# Patient Record
Sex: Female | Born: 1970 | Race: Black or African American | Hispanic: No | Smoking: Current every day smoker
Health system: Southern US, Community
[De-identification: ages and names within clinical notes are randomized; demographics above are authoritative.]

## PROBLEM LIST (undated history)

## (undated) ENCOUNTER — Emergency Department (HOSPITAL_COMMUNITY): Admission: EM | Payer: MEDICAID

## (undated) ENCOUNTER — Emergency Department (HOSPITAL_COMMUNITY): Admission: EM | Payer: Medicaid Other | Source: Home / Self Care

## (undated) DIAGNOSIS — J45909 Unspecified asthma, uncomplicated: Secondary | ICD-10-CM

## (undated) DIAGNOSIS — E039 Hypothyroidism, unspecified: Secondary | ICD-10-CM

## (undated) DIAGNOSIS — F419 Anxiety disorder, unspecified: Secondary | ICD-10-CM

## (undated) DIAGNOSIS — M1990 Unspecified osteoarthritis, unspecified site: Secondary | ICD-10-CM

## (undated) DIAGNOSIS — Q2112 Patent foramen ovale: Secondary | ICD-10-CM

## (undated) DIAGNOSIS — Z720 Tobacco use: Secondary | ICD-10-CM

## (undated) DIAGNOSIS — I639 Cerebral infarction, unspecified: Secondary | ICD-10-CM

## (undated) DIAGNOSIS — F1910 Other psychoactive substance abuse, uncomplicated: Secondary | ICD-10-CM

## (undated) DIAGNOSIS — I499 Cardiac arrhythmia, unspecified: Secondary | ICD-10-CM

## (undated) DIAGNOSIS — R519 Headache, unspecified: Secondary | ICD-10-CM

## (undated) DIAGNOSIS — F329 Major depressive disorder, single episode, unspecified: Secondary | ICD-10-CM

## (undated) DIAGNOSIS — I10 Essential (primary) hypertension: Secondary | ICD-10-CM

## (undated) DIAGNOSIS — I480 Paroxysmal atrial fibrillation: Secondary | ICD-10-CM

## (undated) DIAGNOSIS — Q211 Atrial septal defect: Secondary | ICD-10-CM

## (undated) DIAGNOSIS — F32A Depression, unspecified: Secondary | ICD-10-CM

## (undated) DIAGNOSIS — R011 Cardiac murmur, unspecified: Secondary | ICD-10-CM

## (undated) HISTORY — DX: Depression, unspecified: F32.A

## (undated) HISTORY — DX: Morbid (severe) obesity due to excess calories: E66.01

## (undated) HISTORY — DX: Other psychoactive substance abuse, uncomplicated: F19.10

## (undated) HISTORY — PX: FRACTURE SURGERY: SHX138

## (undated) HISTORY — DX: Anxiety disorder, unspecified: F41.9

## (undated) HISTORY — DX: Tobacco use: Z72.0

## (undated) HISTORY — DX: Patent foramen ovale: Q21.12

## (undated) HISTORY — DX: Hypothyroidism, unspecified: E03.9

## (undated) HISTORY — DX: Paroxysmal atrial fibrillation: I48.0

## (undated) HISTORY — DX: Atrial septal defect: Q21.1

## (undated) HISTORY — DX: Major depressive disorder, single episode, unspecified: F32.9

## (undated) HISTORY — DX: Unspecified asthma, uncomplicated: J45.909

## (undated) HISTORY — DX: Cerebral infarction, unspecified: I63.9

## (undated) HISTORY — DX: Cardiac murmur, unspecified: R01.1

---

## 2002-05-16 ENCOUNTER — Emergency Department (HOSPITAL_COMMUNITY): Admission: EM | Admit: 2002-05-16 | Discharge: 2002-05-16 | Payer: Self-pay | Admitting: Emergency Medicine

## 2003-02-16 ENCOUNTER — Emergency Department (HOSPITAL_COMMUNITY): Admission: AD | Admit: 2003-02-16 | Discharge: 2003-02-16 | Payer: Self-pay | Admitting: Family Medicine

## 2003-10-19 ENCOUNTER — Emergency Department (HOSPITAL_COMMUNITY): Admission: EM | Admit: 2003-10-19 | Discharge: 2003-10-19 | Payer: Self-pay | Admitting: Family Medicine

## 2003-12-22 ENCOUNTER — Emergency Department (HOSPITAL_COMMUNITY): Admission: AD | Admit: 2003-12-22 | Discharge: 2003-12-22 | Payer: Self-pay | Admitting: Emergency Medicine

## 2004-03-30 ENCOUNTER — Emergency Department: Payer: Self-pay | Admitting: Unknown Physician Specialty

## 2004-03-31 ENCOUNTER — Ambulatory Visit: Payer: Self-pay | Admitting: Emergency Medicine

## 2006-02-15 ENCOUNTER — Emergency Department: Payer: Self-pay | Admitting: Emergency Medicine

## 2006-06-16 ENCOUNTER — Emergency Department: Payer: Self-pay | Admitting: Emergency Medicine

## 2006-07-12 ENCOUNTER — Encounter: Payer: Self-pay | Admitting: Maternal & Fetal Medicine

## 2006-09-23 ENCOUNTER — Encounter: Payer: Self-pay | Admitting: Maternal & Fetal Medicine

## 2006-09-27 ENCOUNTER — Observation Stay: Payer: Self-pay

## 2006-09-29 ENCOUNTER — Observation Stay: Payer: Self-pay | Admitting: Obstetrics and Gynecology

## 2006-10-14 ENCOUNTER — Encounter: Payer: Self-pay | Admitting: Obstetrics and Gynecology

## 2006-10-14 ENCOUNTER — Inpatient Hospital Stay: Payer: Self-pay | Admitting: Obstetrics and Gynecology

## 2007-08-05 ENCOUNTER — Emergency Department: Payer: Self-pay | Admitting: Emergency Medicine

## 2007-08-06 ENCOUNTER — Emergency Department (HOSPITAL_COMMUNITY): Admission: EM | Admit: 2007-08-06 | Discharge: 2007-08-07 | Payer: Self-pay | Admitting: Emergency Medicine

## 2009-03-01 ENCOUNTER — Emergency Department: Payer: Self-pay | Admitting: Emergency Medicine

## 2009-04-09 ENCOUNTER — Ambulatory Visit: Payer: Self-pay | Admitting: Orthopedic Surgery

## 2009-06-17 ENCOUNTER — Emergency Department: Payer: Self-pay | Admitting: Emergency Medicine

## 2010-04-01 ENCOUNTER — Ambulatory Visit: Payer: Self-pay | Admitting: Internal Medicine

## 2011-01-02 ENCOUNTER — Emergency Department: Payer: Self-pay | Admitting: Emergency Medicine

## 2011-09-05 LAB — HM PAP SMEAR: HM Pap smear: NEGATIVE

## 2012-07-20 ENCOUNTER — Ambulatory Visit: Payer: Self-pay | Admitting: Orthopedic Surgery

## 2012-07-27 ENCOUNTER — Ambulatory Visit: Payer: Self-pay | Admitting: Orthopedic Surgery

## 2012-09-30 ENCOUNTER — Encounter: Payer: Self-pay | Admitting: Orthopedic Surgery

## 2012-10-06 ENCOUNTER — Encounter: Payer: Self-pay | Admitting: Orthopedic Surgery

## 2012-11-04 ENCOUNTER — Encounter: Payer: Self-pay | Admitting: Orthopedic Surgery

## 2013-07-31 ENCOUNTER — Emergency Department: Payer: Self-pay | Admitting: Emergency Medicine

## 2013-09-02 LAB — COMPREHENSIVE METABOLIC PANEL
ALT: 23 U/L (ref 12–78)
ANION GAP: 6 — AB (ref 7–16)
Albumin: 3.6 g/dL (ref 3.4–5.0)
Alkaline Phosphatase: 81 U/L
BUN: 9 mg/dL (ref 7–18)
Bilirubin,Total: 0.3 mg/dL (ref 0.2–1.0)
CALCIUM: 9 mg/dL (ref 8.5–10.1)
CHLORIDE: 108 mmol/L — AB (ref 98–107)
Co2: 25 mmol/L (ref 21–32)
Creatinine: 1.02 mg/dL (ref 0.60–1.30)
EGFR (African American): >60
EGFR (Non-African Amer.): >60
Glucose: 114 mg/dL — ABNORMAL HIGH (ref 65–99)
OSMOLALITY: 277 (ref 275–301)
POTASSIUM: 3.1 mmol/L — AB (ref 3.5–5.1)
SGOT(AST): 44 U/L — ABNORMAL HIGH (ref 15–37)
SODIUM: 139 mmol/L (ref 136–145)
TOTAL PROTEIN: 7.9 g/dL (ref 6.4–8.2)

## 2013-09-02 LAB — URINALYSIS, COMPLETE
Bilirubin,UR: NEGATIVE
GLUCOSE, UR: NEGATIVE mg/dL (ref 0–75)
Ketone: NEGATIVE
Nitrite: NEGATIVE
PH: 5 (ref 4.5–8.0)
Protein: 30
Specific Gravity: 1.032 (ref 1.003–1.030)
Squamous Epithelial: 14

## 2013-09-02 LAB — CBC WITH DIFFERENTIAL/PLATELET
BASOS ABS: 0.1 10*3/uL (ref 0.0–0.1)
Basophil %: 0.5 %
EOS PCT: 0.8 %
Eosinophil #: 0.1 10*3/uL (ref 0.0–0.7)
HCT: 39.7 % (ref 35.0–47.0)
HGB: 12.8 g/dL (ref 12.0–16.0)
LYMPHS ABS: 4 10*3/uL — AB (ref 1.0–3.6)
LYMPHS PCT: 27.5 %
MCH: 27.3 pg (ref 26.0–34.0)
MCHC: 32.1 g/dL (ref 32.0–36.0)
MCV: 85 fL (ref 80–100)
MONO ABS: 0.8 x10 3/mm (ref 0.2–0.9)
MONOS PCT: 5.2 %
NEUTROS ABS: 9.7 10*3/uL — AB (ref 1.4–6.5)
Neutrophil %: 66 %
Platelet: 355 10*3/uL (ref 150–440)
RBC: 4.68 10*6/uL (ref 3.80–5.20)
RDW: 14.4 % (ref 11.5–14.5)
WBC: 14.7 10*3/uL — ABNORMAL HIGH (ref 3.6–11.0)

## 2013-09-02 LAB — TROPONIN I: Troponin-I: 0.84 ng/mL — ABNORMAL HIGH

## 2013-09-02 LAB — LIPASE, BLOOD: LIPASE: 153 U/L (ref 73–393)

## 2013-09-03 ENCOUNTER — Inpatient Hospital Stay: Payer: Self-pay | Admitting: Internal Medicine

## 2013-09-03 LAB — CK TOTAL AND CKMB (NOT AT ARMC)
CK, TOTAL: 223 U/L — AB
CK, Total: 225 U/L — ABNORMAL HIGH
CK-MB: 1.8 ng/mL (ref 0.5–3.6)
CK-MB: 1.8 ng/mL (ref 0.5–3.6)

## 2013-09-03 LAB — TROPONIN I
Troponin-I: 0.71 ng/mL — ABNORMAL HIGH
Troponin-I: 0.64 ng/mL — ABNORMAL HIGH

## 2013-09-03 LAB — APTT
ACTIVATED PTT: 30.9 s (ref 23.6–35.9)
ACTIVATED PTT: 67.2 s — AB (ref 23.6–35.9)
ACTIVATED PTT: 69.7 s — AB (ref 23.6–35.9)

## 2013-09-03 LAB — PROTIME-INR
INR: 1
PROTHROMBIN TIME: 13.5 s (ref 11.5–14.7)

## 2013-09-03 LAB — T4, FREE: FREE THYROXINE: 1 ng/dL (ref 0.76–1.46)

## 2013-09-03 LAB — MAGNESIUM: Magnesium: 1.8 mg/dL

## 2013-09-04 LAB — HEPATIC FUNCTION PANEL A (ARMC)
ALK PHOS: 122 U/L — AB
ALT: 217 U/L — AB (ref 12–78)
Albumin: 3 g/dL — ABNORMAL LOW (ref 3.4–5.0)
BILIRUBIN TOTAL: 0.2 mg/dL (ref 0.2–1.0)
Bilirubin, Direct: <0.1 mg/dL (ref 0.00–0.20)
SGOT(AST): 205 U/L — ABNORMAL HIGH (ref 15–37)
Total Protein: 6.6 g/dL (ref 6.4–8.2)

## 2013-09-04 LAB — CBC WITH DIFFERENTIAL/PLATELET
BASOS PCT: 0.7 %
Basophil #: 0.1 10*3/uL (ref 0.0–0.1)
EOS ABS: 0.2 10*3/uL (ref 0.0–0.7)
EOS PCT: 2.6 %
HCT: 37.3 % (ref 35.0–47.0)
HGB: 12.1 g/dL (ref 12.0–16.0)
LYMPHS PCT: 46 %
Lymphocyte #: 4.3 10*3/uL — ABNORMAL HIGH (ref 1.0–3.6)
MCH: 27.6 pg (ref 26.0–34.0)
MCHC: 32.5 g/dL (ref 32.0–36.0)
MCV: 85 fL (ref 80–100)
MONO ABS: 0.7 x10 3/mm (ref 0.2–0.9)
MONOS PCT: 7.8 %
NEUTROS PCT: 42.9 %
Neutrophil #: 4 10*3/uL (ref 1.4–6.5)
Platelet: 305 10*3/uL (ref 150–440)
RBC: 4.39 10*6/uL (ref 3.80–5.20)
RDW: 14.6 % — AB (ref 11.5–14.5)
WBC: 9.3 10*3/uL (ref 3.6–11.0)

## 2013-09-04 LAB — LIPID PANEL
Cholesterol: 140 mg/dL (ref 0–200)
HDL: 30 mg/dL — AB (ref 40–60)
LDL CHOLESTEROL, CALC: 83 mg/dL (ref 0–100)
TRIGLYCERIDES: 137 mg/dL (ref 0–200)
VLDL CHOLESTEROL, CALC: 27 mg/dL (ref 5–40)

## 2013-09-04 LAB — BASIC METABOLIC PANEL
Anion Gap: 5 — ABNORMAL LOW (ref 7–16)
BUN: 11 mg/dL (ref 7–18)
CHLORIDE: 108 mmol/L — AB (ref 98–107)
CO2: 24 mmol/L (ref 21–32)
Calcium, Total: 8.8 mg/dL (ref 8.5–10.1)
Creatinine: 0.81 mg/dL (ref 0.60–1.30)
EGFR (Non-African Amer.): >60
Glucose: 103 mg/dL — ABNORMAL HIGH (ref 65–99)
OSMOLALITY: 273 (ref 275–301)
POTASSIUM: 3.8 mmol/L (ref 3.5–5.1)
Sodium: 137 mmol/L (ref 136–145)

## 2013-09-04 LAB — PROTIME-INR
INR: 1.2
Prothrombin Time: 14.6 secs (ref 11.5–14.7)

## 2013-09-04 LAB — APTT: Activated PTT: 97.6 secs — ABNORMAL HIGH (ref 23.6–35.9)

## 2013-09-06 ENCOUNTER — Other Ambulatory Visit: Payer: Self-pay | Admitting: Urgent Care

## 2013-09-06 LAB — HEPATIC FUNCTION PANEL A (ARMC)
ALBUMIN: 3.6 g/dL (ref 3.4–5.0)
ALT: 119 U/L — AB (ref 12–78)
AST: 51 U/L — AB (ref 15–37)
Alkaline Phosphatase: 109 U/L
BILIRUBIN TOTAL: 0.4 mg/dL (ref 0.2–1.0)
Bilirubin, Direct: <0.1 mg/dL (ref 0.00–0.20)
TOTAL PROTEIN: 7.9 g/dL (ref 6.4–8.2)

## 2013-10-03 ENCOUNTER — Ambulatory Visit: Payer: Self-pay | Admitting: Gastroenterology

## 2014-04-20 DIAGNOSIS — Z9151 Personal history of suicidal behavior: Secondary | ICD-10-CM | POA: Insufficient documentation

## 2014-06-06 LAB — HM PAP SMEAR: HM Pap smear: NEGATIVE

## 2014-07-28 NOTE — Discharge Summary (Signed)
PATIENT NAME:  Margaret [name], Margaret [name] MR#:  [id number] DATE OF BIRTH:  1970-11-11  DATE OF ADMISSION:  09/03/2013 DATE OF DISCHARGE:  09/04/2013  The patient signed AGAINST MEDICAL ADVICE on 09/04/2013.  DIAGNOSES:  Which we were treating at that time: 1.  Chest pain, elevated troponin, cardiac cath was done and negative.  2.  Hypothyroidism.  3.  Smoking.  4.  Hypokalemia.  5. Lower abdominal pain and cholelithiasis. Plan was for endoscopic retrograde cholangiopancreatography, but patient signed AGAINST MEDICAL ADVICE.   HISTORY OF PRESENT ILLNESS: A 44 year old female with significant past medical history of hypothyroidism presented with complaint of epigastric chest pain radiating to right arm that started a few hours prior to presentation. The pain was waxing and waning, but was consistent for the last few hours, and so decided to come to the Emergency Room. Troponin was noted to be 0.84 and she was admitted with IV heparin drip and possible diagnosis of non-ST-elevation MI or coronary artery disease to medical floor.   HOSPITAL COURSE AND STAY: She was seen by cardiologist and next day cardiac catheterization was done which was negative  troponin.    OTHER MEDICAL ISSUES:   1.  For the lower abdominal pain, sonogram was done, which showed that she has cholelithiasis but no active infection.  Surgery consult was done and GI consult was done.  Finally, GI was planning to do ERCP the next day, but in the night, she said she could not wait and she had to go, has some emergencies at home and so decided to sign out against medical.  2.  Hypothyroidism. We continued levothyroxine.  3.  Smoking. She was counseled for that and offered nicotine patch.  4.  Urinary tract infection. We treated with Rocephin.   IMPORTANT LABORATORY RESULTS IN THE HOSPITAL: Troponin was 0.84 on admission. WBC 14.7. Hemoglobin was 12.8. Platelet count was 855. Creatinine 1.02. Thyroxine free was 1.  Ultrasound of  abdomen, limited survey, showed choledocholithiasis with common bile duct enlargement and obstruction, cholelithiasis without any cholecystitis. Cardiac catheterization was done and it showed normal coronary arteries, normal left ventricular function.   Total time spent on this discharge summary is 35 minutes.   ____________________________ Hope PigeonVaibhavkumar G. Elisabeth PigeonVachhani, MD vgv:dmm D: 09/07/2013 14:34:00 ET T: 09/07/2013 19:39:56 ET JOB#: [id number]  cc: Hope PigeonVaibhavkumar G. Elisabeth PigeonVachhani, MD, <Dictator> Altamese DillingVAIBHAVKUMAR Joaovictor Krone MD ELECTRONICALLY SIGNED 09/20/2013 9:02

## 2014-07-28 NOTE — Consult Note (Signed)
PATIENT NAME:  Margaret [name], Margaret [name] MR#:  [id number] DATE OF BIRTH:  04/11/1970  DATE OF CONSULTATION:  09/04/2013  REFERRING PHYSICIAN:  Cristal Deer A. Lundquist, MD CONSULTING PHYSICIAN:  Midge Minium, MD; Joselyn Arrow, NP CARDIOLOGIST:  Marcina Millard, MD  REASON FOR CONSULTATION: Choledocholithiasis.   HISTORY OF PRESENT ILLNESS: Margaret [name] is a 44 year old female with history of hypothyroidism. She reports 2 days ago around 9:00 p.m., she developed severe right upper quadrant epigastric pain which radiated to her chest, her right shoulder, and her back. The pain waxed and waned, but she had 10 episodes of nausea and vomiting with the pain. She continues to have pain at a rate of 5 out of 10, down from 10 out of 10. She underwent a cardiac catheterization today which was negative. She had a bump in her alkaline phosphatase to 122, AST to 205, and ALT to 217. She had 3 elevated troponins. Her white blood cell count was elevated at 14.7, now normal today. Her INR is 1.2. She did have a UTI as well and has been started on Rocephin daily for this. She has not had a bowel movement in the last couple of days, but really has not been eating much. She denies any GI history, including a history of GERD or peptic ulcer disease. She denies any rectal bleeding, melena, or diarrhea. She denies any fever or chills.   PAST MEDICAL AND SURGICAL HISTORY: Hypothyroidism.   MEDICATIONS PRIOR TO ADMISSION: Levothyroxine 100 mcg daily.   ALLERGIES: No known drug allergies.   FAMILY HISTORY: There is no known family history of colorectal carcinoma, liver or chronic GI problems.   SOCIAL HISTORY: She is single. She has 2 children; one is grown and the other child lives with her. She smokes less than 1/2 pack of cigarettes per day. Denies any alcohol or illicit drug use. 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   REVIEW OF SYSTEMS: See HPI. She had mild dysuria. Otherwise negative 12-point review  of systems.   PHYSICAL EXAMINATION: VITAL SIGNS: Temperature 98.7, pulse 69, respirations 18, blood pressure 106/73, O2 sat 98% on room air.  GENERAL: She is an obese black female who is alert, oriented, pleasant and cooperative, in no acute distress.  HEENT: Sclerae clear, anicteric. Conjunctivae pink. Oropharynx pink and moist without lesions.  NECK: Supple without mass or thyromegaly.  HEART: Regular rate and rhythm. Normal S1, S2 without murmurs, clicks, rubs, or gallops. LUNGS: Clear to auscultation bilaterally.  ABDOMEN: Positive bowel sounds x 4. No bruits auscultated. Abdomen is soft, nondistended. She does have mild tenderness to the epigastrium and right upper quadrant on deep palpation. There is no rebound tenderness or guarding. No hepatosplenomegaly or mass. Positive Murphy's  point tenderness.  EXTREMITIES: Without clubbing or edema.  SKIN: Warm and dry without any rash or jaundice.  MUSCULOSKELETAL: Good equal movement and strength bilaterally.  NEUROLOGIC: Grossly intact.  PSYCHIATRIC: Alert, pleasant, cooperative, normal mood and affect.   IMPRESSION: [name] is a very pleasant 44 year old black female admitted with biliary-type abdominal pain, nausea, vomiting, and bump in transaminases with ultrasound showing choledocholithiasis. She had a negative cardiac cath today. ERCP for stone extraction and sphincterotomy tomorrow with Dr. Servando Snare. I have discussed risks and benefits of the procedure, which include but are not limited to pancreatitis, bleeding, infection, perforation, or drug reaction. She agrees with this plan, and consent will be obtained. She is on Rocephin daily.   PLAN: 1.  NPO after  midnight.  2.  ERCP tomorrow.  3.  Continue supportive measures.   Thank you for allowing us to participate in her care.   ____________________________ Joselyn ArrowKandice L. Kerrington Greenhalgh, NP klj:jcm D: 09/04/2013 17:02:27 ET T: 09/04/2013 17:49:30 ET JOB#: [id number]  cc: Joselyn ArrowKandice L. Stori Royse,  NP, <Dictator> Marcina MillardAlexander Paraschos, MD Joselyn ArrowKANDICE L Darien Mignogna FNP ELECTRONICALLY SIGNED 09/25/2013 20:06

## 2014-07-28 NOTE — H&P (Signed)
PATIENT NAME:  Margaret BreezeHAITH HALILOU, Malynn L MR#:  [id number] DATE OF BIRTH:  1970-08-22  DATE OF ADMISSION:  09/03/2013  REFERRING PHYSICIAN: Dr. Loleta Roseory Forbach.  PRIMARY CARE PHYSICIAN: [facility].   CHIEF COMPLAINT: Chest pain, epigastric pain.   HISTORY OF PRESENT ILLNESS: This is a 44 year old female with significant past medical history of hypothyroidism, presents with complaints of epigastric/chest pain radiating to the right arm, started few hours prior to presentation. The patient reports her pain waxed and waned, but currently has been persistent over the last few hours, described it as sharp, stabbing quality, as well was accompanied by a few episodes of nausea and vomiting. Currently, she denies any chest pain, reports it was worsened by movement with no relieving factor. This is the patient's first visit to us so we no baseline EKG to compare. Did show some mild flattening over the T wave on the lateral leads. The patient's workup was significant for elevated troponin at 0.84. The patient denies any previous cardiac history or as well, no family history of cardiac disease at a young age. She reports smoking. One pack lasts her around 3 days. She has mild leukocytosis at 14,000, as well she has positive urinalysis. The patient was given aspirin and started on heparin drip in ED out of concern of acute coronary syndrome. Hospitalist service was requested to admit the patient for further evaluation.   PAST MEDICAL HISTORY: Hypothyroidism.   FAMILY HISTORY: The patient denies any family history of coronary artery disease, reports a family history of hypertension.   SOCIAL HISTORY: The patient smokes. One pack lasts her for 3 days. No alcohol. No illicit drug use. Works at [employer].   ALLERGIES: No known drug allergies.   HOME MEDICATIONS:  1. Levothyroxine 100 mcg oral daily.  2. Multivitamin 1 tablet daily.   REVIEW OF SYSTEMS:   GENERAL: The patient denies any fever, chills, fatigue, weakness.   EYES: Denies blurry vision, double vision, inflammation, glaucoma.  ENT: Denies tinnitus, ear pain, hearing loss, epistaxis.  RESPIRATORY: Denies cough, wheezing, hemoptysis, shortness of breath.  CARDIOVASCULAR: Reports chest/epigastric pain. Denies any edema, palpitations, syncope.  GASTROINTESTINAL: Reports nausea, vomiting, abdominal pain. Denies any hematemesis, coffee-ground emesis, melena, bright red blood per rectum.  GENITOURINARY: Denies dysuria, hematuria, or renal colic.  ENDOCRINE: Denies polyuria, polydipsia, heat or cold intolerance.  HEMATOLOGY: Denies anemia, easy bruising, bleeding diathesis. INTEGUMENT: Denies acne, rash, or skin lesion.  MUSCULOSKELETAL: Denies any swelling, gout, cramps. NEUROLOGIC: Denies CVA, TIA, numbness, dysarthria.  PSYCHIATRIC: Denies anxiety, insomnia, or depression.   PHYSICAL EXAMINATION:  VITAL SIGNS: Temperature 97.8, pulse 87, respiratory rate 20, blood pressure 121/82, saturating 97% on room air.  GENERAL: Well-nourished female who looks comfortable in bed, in no apparent distress.  HEENT: Head atraumatic, normocephalic. Pupils equal, reactive to light. Pink conjunctivae. Anicteric sclerae. Moist oral mucosa.  NECK: Supple. No thyromegaly. No JVD.  CHEST: Good air entry bilaterally. No wheezing, rales, rhonchi.  CARDIOVASCULAR: S1, S2. No rubs, murmurs, and gallops.  ABDOMEN: Has mild epigastric tenderness to palpation. Reports it resembles  pain she came with. As well, had some right upper quadrant tenderness but negative Murphy's sign. Bowel sounds present. No organomegaly appreciated.  EXTREMITIES: No edema. No clubbing. No cyanosis. Pedal and radial pulses felt bilaterally.  PSYCHIATRIC: Appropriate affect. Awake, alert x 3. Intact judgment and insight.  NEUROLOGIC: Cranial nerves grossly intact. Motor 5/5. No focal deficits.  SKIN: Normal skin turgor. Warm and dry. No rash.  MUSCULOSKELETAL: No joint effusion or erythema.  PERTINENT LABORATORIES: Glucose 114, BUN 9, creatinine 1.02, sodium 139, potassium 3.1, chloride 108, CO2 of 25, ALT 23, AST 44, alkaline phosphatase 81. Troponin 0.84. White blood cells 14.7, hemoglobin 12.8 , platelets 355,000. INR is 1. Urinalysis showing +1 leukocyte esterase with 7 white blood cells.  Chest x-ray showing no acute cardiopulmonary abnormality.   ASSESSMENT AND PLAN:  1. Chest pain with elevated troponins. The patient's risk factor includes her smoking history. She was given aspirin and started on heparin drip in Emergency Department out of concern of acute coronary syndrome. We will start her on beta blockers as well. We will keep her on sublingual nitroglycerin as needed. Will keep on a daily baby aspirin. We will continue to cycle her cardiac enzymes, follow the trend. She will be admitted to telemetry. We will consult cardiology for further recommendation. Will check lipid panel as well.  2. Hypothyroidism. Continue with levothyroxine.  3. Tobacco abuse: The patient was counseled. Will be started on Nicoderm patch.  4. Hypokalemia. We will replace. 5. Urinary tract infection. Continue with Rocephin. 6. Deep vein thrombosis prophylaxis. The patient is currently on full dose anticoagulation with heparin drip.   TOTAL TIME SPENT ON ADMISSION AND PATIENT CARE: 50 minutes.    ____________________________ Starleen Arms, MD dse:lt D: 09/03/2013 02:24:11 ET T: 09/03/2013 06:38:35 ET JOB#: [id number]  cc: Starleen Arms, MD, <Dictator> Kennisha Qin Teena Irani MD ELECTRONICALLY SIGNED 09/03/2013 23:53

## 2014-07-28 NOTE — Consult Note (Signed)
Brief Consult Note: Comments: Attempted to see pt for GI consult but pt down for cardiac cath. Will check back this afternoon after clinic. Please call if you have any questions or concerns.  Electronic Signatures: Joselyn ArrowJones, Zion Lint L (NP)  (Signed 01-Jun-15 13:19)  Authored: Brief Consult Note   Last Updated: 01-Jun-15 13:19 by Joselyn ArrowJones, Krystl Wickware L (NP)

## 2014-07-28 NOTE — Consult Note (Signed)
Brief Consult Note: Diagnosis: Choledocholithiasis.   Patient was seen by consultant.   Comments: [name] is a very pleasant 44 y/o black female admitted with biliary type abdominal pain, nausea, vomiting & bump in transaminases with ultrasound showing choledocholithiasis.  She had a negative cardiac cath today.  ERCP for stone extraction & sphincterotomy tomorrow with Dr Servando SnareWohl.  Discussed risks/benefits of procedure which include but are not limited to pancreatitis, bleeding, infection, perforation & drug reaction.  Patient agrees with this plan & consent will be obtained.  She is on Rocephin daily.  Plan: 1) NPO after MN 2) ERCP tomorrow 3) Continue supportive measures Thanks for allowing us to participate in her care.  Please see full dictated note. #[id number].  Electronic Signatures: Margaret [name], Margaret [name] (NP)  (Signed 01-Jun-15 16:58)  Authored: Brief Consult Note   Last Updated: 01-Jun-15 16:58 by Margaret [name], Margaret [name] (NP)

## 2014-07-28 NOTE — Consult Note (Signed)
PATIENT NAME:  Margaret [name], Margaret [name] DATE OF BIRTH:  [birth year]  DATE OF CONSULTATION:  09/04/2013  REFERRING PHYSICIAN:   CONSULTING PHYSICIAN:  Cristal Deerhristopher A. Nesbit Michon, MD  REASON FOR CONSULTATION: Right upper quadrant pain, chest pain, gallstones on ultrasound and common bile duct stones on ultrasound.   HISTORY OF PRESENT ILLNESS: Margaret [name] is a pleasant 44 year old female who presented with history of hypothyroidism, who presented on Saturday with acute onset of severe right upper quadrant pain radiating up to her chest and sternum, as well as numbness in her right arm and shoulder. She was brought to the ER and noted to have a troponin of 0.84. She was also noted to have a white cell count 14,000 with a urinary tract infection. She is still having some right upper quadrant pain. Also reports some suprapubic pain, which is new. Otherwise, no fevers, chills, night sweats, shortness of breath, cough, left-sided chest pain, nausea, vomiting, diarrhea, constipation, dysuria, hematuria. Does report that she has had episodes of right upper quadrant pain in the past.   PAST MEDICAL HISTORY: Hypothyroidism.   HOME MEDICATIONS: As follows: L-thyroxine 100 mcg p.o. daily.   SOCIAL HISTORY: Smokes approximately 6 to 7 cigarettes a day. Denies alcohol use. Is single with 3 children.   FAMILY HISTORY: History of diabetes, high blood pressure. No obvious history of coronary artery disease or MI. Does have a history of cervical cancer, I believe, in her grandmother.   REVIEW OF SYSTEMS: The 12-point review of systems was obtained. Pertinent positives and negatives as above.   PHYSICAL EXAMINATION:  VITAL SIGNS: Temperature 98.1, pulse 69, blood pressure 112/76, respirations 18, 99% on room air.  GENERAL: No acute distress, alert and oriented x 3.  HEAD: Normocephalic, atraumatic.  EYES: No scleral icterus. No conjunctivitis.   FACE: No visible face trauma. Normal external  nose. Normal external ears.  CHEST: Lungs clear to auscultation. Moving air well.  HEART: Regular rate and rhythm. No murmurs, rubs or gallops.  ABDOMEN: Soft, mildly tender right upper quadrant, minimal tender suprapubic and right lower quadrant.  EXTREMITIES: Moves all extremities well. Strength 5/5.  NEUROLOGIC: Cranial nerves II through XII grossly intact.   DIAGNOSTIC DATA: Labs significant for current white cell count of 9.3, hemoglobin 12.1, hematocrit 37.3, platelets 305. Troponin 0.64 yesterday morning. Labs otherwise normal. LFTs are normal.  U/S shows a 9 mm common bile duct, 3 calculi in the mid to distal common bile duct, non-mobile stones in the gallbladder.   ASSESSMENT AND PLAN: Margaret [name] is a pleasant 44 year old who presents with right upper quadrant pain, elevated troponin, as well as a urinary tract infection, not concerned for appendicitis based on the patient's clinical exam and laboratory values as does not have a significant leukocytosis with neutrophil predominance. Pain may be related to cholelithiasis. I have recommended GI consult and I have spoken with GI about possible ERCP. Will follow up on cardiac cath findings. Will determine possible surgical options when cath findings and GI consults are determined.    ____________________________ Si Raiderhristopher A. Sie Formisano, MD cal:es D: 09/04/2013 12:16:40 ET T: 09/04/2013 13:01:33 ET JOB#: [id number]  cc: Cristal Deerhristopher A. Fernando Torry, MD, <Dictator> Jarvis NewcomerHRISTOPHER A Brande Uncapher MD ELECTRONICALLY SIGNED 09/05/2013 17:18

## 2014-07-28 NOTE — Consult Note (Signed)
PATIENT NAME:  Margaret BreezeHAITH HALILOU, Harleyquinn L MR#:  [id number] DATE OF BIRTH:  [birth year]  DATE OF CONSULTATION:  09/03/2013  REFERRING PHYSICIAN:  Dr. Randol KernElgergawy CONSULTING PHYSICIAN:  Marcina MillardAlexander Alonni Heimsoth, MD  PRIMARY CARE PHYSICIAN: [facility].   CHIEF COMPLAINT: Chest pain.   HISTORY OF PRESENT ILLNESS: The patient is a 44 year old female with history of hypothyroidism. The patient was in usual state of health until day of admission, when she experienced right upper quadrant discomfort described as a stabbing sensation which radiated up to her chest, and with associated numbness in her right arm and shoulder. Symptoms waxed and waned for several hours. The [facility] where EKG was nondiagnostic. Troponin was elevated at 0.84. The patient also had an elevated white count of 14,000 with positive urinalysis for urinary tract infection.   PAST MEDICAL HISTORY: Hypothyroidism.   MEDICATIONS: Levothyroxine 100 mcg daily.   SOCIAL HISTORY: The patient is single. She has three children. She smokes less than 1/2 pack of cigarettes a day.   FAMILY HISTORY: No immediate family history of coronary artery disease or myocardial infarction.   REVIEW OF SYSTEMS:  CONSTITUTIONAL: The patient has had some mild chills.  EYES: No blurry vision.  EARS: No hearing loss.  RESPIRATORY: No shortness of breath.  CARDIOVASCULAR: Chest pain as described above.  GASTROINTESTINAL: The patient had right upper quadrant discomfort.  GENITOURINARY: The patient has had some mild dysuria.  MUSCULOSKELETAL: No arthralgias or myalgias.   NEUROLOGICAL: No focal muscle weakness or numbness.  PSYCHOLOGICAL: No depression or anxiety.   PHYSICAL EXAMINATION: VITAL SIGNS: Blood pressure 108/77, pulse 81 respiration 20, temperature 97.9, pulse oximetry 100%.  HEENT: Pupils equal, reactive to light and accommodation.  NECK: Supple without thyromegaly.  LUNGS: Clear.  HEART: Normal JVP. Normal PMI.  Regular rate and rhythm. Normal S1, S2. No appreciable gallop, murmur, or rub.  ABDOMEN: Soft and nontender. Pulses were intact bilaterally.  MUSCULOSKELETAL: Normal muscle tone.  NEUROLOGIC: The patient is alert and oriented x 3. Motor and sensory both grossly intact.   IMPRESSION: A 44 year old female with new onset chest pain with typical and atypical features with elevated troponin worrisome for unstable angina versus non-ST elevation myocardial infarction.   RECOMMENDATIONS: 1. Agree with overall current therapy.  2. Continue heparin drip.  3. Proceed with cardiac catheterization with selective coronary arteriography in the a.m.   The risks, benefits, and alternatives were explained and informed written consent obtained.   ____________________________ Marcina MillardAlexander Brindy Higginbotham, MD ap:sg D: 09/03/2013 09:28:53 ET T: 09/03/2013 10:50:26 ET JOB#: [id number]  cc: Marcina MillardAlexander Kruze Atchley, MD, <Dictator> Marcina MillardALEXANDER Niranjan Rufener MD ELECTRONICALLY SIGNED 09/19/2013 8:28

## 2014-08-26 ENCOUNTER — Emergency Department: Payer: Medicaid Other

## 2014-08-26 ENCOUNTER — Emergency Department
Admission: EM | Admit: 2014-08-26 | Discharge: 2014-08-26 | Disposition: A | Discharge status: Home / Self Care | Payer: Medicaid Other | Attending: Emergency Medicine | Admitting: Emergency Medicine

## 2014-08-26 ENCOUNTER — Encounter: Payer: Self-pay | Admitting: Emergency Medicine

## 2014-08-26 DIAGNOSIS — S199XXA Unspecified injury of neck, initial encounter: Secondary | ICD-10-CM | POA: Diagnosis present

## 2014-08-26 DIAGNOSIS — Z720 Tobacco use: Secondary | ICD-10-CM | POA: Diagnosis not present

## 2014-08-26 DIAGNOSIS — S161XXA Strain of muscle, fascia and tendon at neck level, initial encounter: Secondary | ICD-10-CM | POA: Diagnosis not present

## 2014-08-26 DIAGNOSIS — Z3202 Encounter for pregnancy test, result negative: Secondary | ICD-10-CM | POA: Diagnosis not present

## 2014-08-26 DIAGNOSIS — Y92410 Unspecified street and highway as the place of occurrence of the external cause: Secondary | ICD-10-CM | POA: Insufficient documentation

## 2014-08-26 DIAGNOSIS — S3992XA Unspecified injury of lower back, initial encounter: Secondary | ICD-10-CM | POA: Diagnosis not present

## 2014-08-26 DIAGNOSIS — Y9389 Activity, other specified: Secondary | ICD-10-CM | POA: Diagnosis not present

## 2014-08-26 DIAGNOSIS — Y998 Other external cause status: Secondary | ICD-10-CM | POA: Insufficient documentation

## 2014-08-26 DIAGNOSIS — T148XXA Other injury of unspecified body region, initial encounter: Secondary | ICD-10-CM

## 2014-08-26 LAB — URINALYSIS COMPLETE WITH MICROSCOPIC (ARMC ONLY)
BILIRUBIN URINE: NEGATIVE
Bacteria, UA: NONE SEEN
GLUCOSE, UA: NEGATIVE mg/dL
HGB URINE DIPSTICK: NEGATIVE
KETONES UR: NEGATIVE mg/dL
LEUKOCYTES UA: NEGATIVE
Nitrite: NEGATIVE
PH: 6 (ref 5.0–8.0)
PROTEIN: NEGATIVE mg/dL
RBC / HPF: NONE SEEN RBC/hpf (ref 0–5)
Specific Gravity, Urine: 1.01 (ref 1.005–1.030)

## 2014-08-26 LAB — POCT PREGNANCY, URINE: Preg Test, Ur: NEGATIVE

## 2014-08-26 MED ORDER — ORPHENADRINE CITRATE 30 MG/ML IJ SOLN
60.0000 mg | Freq: Two times a day (BID) | INTRAMUSCULAR | Status: DC
  Administered 2014-08-26: 60 mg via INTRAMUSCULAR

## 2014-08-26 MED ORDER — KETOROLAC TROMETHAMINE 60 MG/2ML IM SOLN
INTRAMUSCULAR | Status: AC
  Filled 2014-08-26: qty 2

## 2014-08-26 MED ORDER — ORPHENADRINE CITRATE 30 MG/ML IJ SOLN
INTRAMUSCULAR | Status: AC
  Filled 2014-08-26: qty 2

## 2014-08-26 MED ORDER — CYCLOBENZAPRINE HCL 10 MG PO TABS: 10.0000 mg | ORAL_TABLET | Freq: Three times a day (TID) | ORAL | Status: DC | PRN

## 2014-08-26 MED ORDER — IBUPROFEN 800 MG PO TABS: 800.0000 mg | ORAL_TABLET | Freq: Three times a day (TID) | ORAL | Status: DC | PRN

## 2014-08-26 MED ORDER — KETOROLAC TROMETHAMINE 60 MG/2ML IM SOLN
60.0000 mg | Freq: Once | INTRAMUSCULAR | Status: AC
  Administered 2014-08-26: 60 mg via INTRAMUSCULAR
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
## 2014-10-18 ENCOUNTER — Ambulatory Visit (INDEPENDENT_AMBULATORY_CARE_PROVIDER_SITE_OTHER): Payer: Medicaid Other | Admitting: Family Medicine

## 2014-10-18 ENCOUNTER — Encounter: Payer: Self-pay | Admitting: Family Medicine

## 2014-10-18 VITALS — BP 117/82 | Temp 98.5°F | Ht 66.0 in | Wt 210.9 lb

## 2014-10-18 DIAGNOSIS — E039 Hypothyroidism, unspecified: Secondary | ICD-10-CM | POA: Diagnosis not present

## 2014-10-18 DIAGNOSIS — N926 Irregular menstruation, unspecified: Secondary | ICD-10-CM | POA: Diagnosis not present

## 2014-10-18 DIAGNOSIS — Z720 Tobacco use: Secondary | ICD-10-CM

## 2014-10-18 DIAGNOSIS — R7989 Other specified abnormal findings of blood chemistry: Secondary | ICD-10-CM | POA: Diagnosis not present

## 2014-10-18 DIAGNOSIS — N898 Other specified noninflammatory disorders of vagina: Secondary | ICD-10-CM

## 2014-10-18 LAB — CBC WITH DIFFERENTIAL/PLATELET
HEMATOCRIT: 34.9 % (ref 34.0–46.6)
Hemoglobin: 12 g/dL (ref 11.1–15.9)
LYMPHS: 44 %
Lymphocytes Absolute: 4.6 10*3/uL — ABNORMAL HIGH (ref 0.7–3.1)
MCH: 28.6 pg (ref 26.6–33.0)
MCHC: 34.4 g/dL (ref 31.5–35.7)
MCV: 83 fL (ref 79–97)
MID (Absolute): 0.7 10*3/uL (ref 0.1–1.6)
MID: 7 %
NEUTROS PCT: 49 %
Neutrophils Absolute: 5 10*3/uL (ref 1.4–7.0)
Platelets: 341 10*3/uL (ref 150–379)
RBC: 4.19 x10E6/uL (ref 3.77–5.28)
RDW: 14.7 % (ref 12.3–15.4)
WBC: 10.3 10*3/uL (ref 3.4–10.8)

## 2014-10-18 LAB — UA/M W/RFLX CULTURE, ROUTINE
Bilirubin, UA: NEGATIVE
Glucose, UA: NEGATIVE
KETONES UA: NEGATIVE
Leukocytes, UA: NEGATIVE
Nitrite, UA: NEGATIVE
PH UA: 6 (ref 5.0–7.5)
Protein, UA: NEGATIVE
RBC, UA: NEGATIVE
SPEC GRAV UA: 1.01 (ref 1.005–1.030)
Urobilinogen, Ur: 0.2 mg/dL (ref 0.2–1.0)

## 2014-10-18 LAB — WET PREP FOR TRICH, YEAST, CLUE
CLUE CELL EXAM: NEGATIVE
Trichomonas Exam: NEGATIVE
Yeast Exam: NEGATIVE

## 2014-10-18 LAB — PREGNANCY, URINE: Preg Test, Ur: NEGATIVE

## 2014-10-18 NOTE — Assessment & Plan Note (Signed)
Not anemic today. Possibly due to over-donating. Will check iron studies and B12/folate. Will not sign off on her donating plasma 2x a week- possibly can donate 1-2x per month pending results. Will fill out paperwork following her physical.

## 2014-10-18 NOTE — Patient Instructions (Addendum)
Hypothyroidism The thyroid is a large gland located in the lower front of your neck. The thyroid gland helps control metabolism. Metabolism is how your body handles food. It controls metabolism with the hormone thyroxine. When this gland is underactive (hypothyroid), it produces too little hormone.  CAUSES These include:   Absence or destruction of thyroid tissue.  Goiter due to iodine deficiency.  Goiter due to medications.  Congenital defects (since birth).  Problems with the pituitary. This causes a lack of TSH (thyroid stimulating hormone). This hormone tells the thyroid to turn out more hormone. SYMPTOMS  Lethargy (feeling as though you have no energy)  Cold intolerance  Weight gain (in spite of normal food intake)  Dry skin  Coarse hair  Menstrual irregularity (if severe, may lead to infertility)  Slowing of thought processes Cardiac problems are also caused by insufficient amounts of thyroid hormone. Hypothyroidism in the newborn is cretinism, and is an extreme form. It is important that this form be treated adequately and immediately or it will lead rapidly to retarded physical and mental development. DIAGNOSIS  To prove hypothyroidism, your caregiver may do blood tests and ultrasound tests. Sometimes the signs are hidden. It may be necessary for your caregiver to watch this illness with blood tests either before or after diagnosis and treatment. TREATMENT  Low levels of thyroid hormone are increased by using synthetic thyroid hormone. This is a safe, effective treatment. It usually takes about four weeks to gain the full effects of the medication. After you have the full effect of the medication, it will generally take another four weeks for problems to leave. Your caregiver may start you on low doses. If you have had heart problems the dose may be gradually increased. It is generally not an emergency to get rapidly to normal. HOME CARE INSTRUCTIONS   Take your  medications as your caregiver suggests. Let your caregiver know of any medications you are taking or start taking. Your caregiver will help you with dosage schedules.  As your condition improves, your dosage needs may increase. It will be necessary to have continuing blood tests as suggested by your caregiver.  Report all suspected medication side effects to your caregiver. SEEK MEDICAL CARE IF: Seek medical care if you develop:  Sweating.  Tremulousness (tremors).  Anxiety.  Rapid weight loss.  Heat intolerance.  Emotional swings.  Diarrhea.  Weakness. SEEK IMMEDIATE MEDICAL CARE IF:  You develop chest pain, an irregular heart beat (palpitations), or a rapid heart beat. MAKE SURE YOU:   Understand these instructions.  Will watch your condition.  Will get help right away if you are not doing well or get worse. Document Released: 03/23/2005 Document Revised: 06/15/2011 Document Reviewed: 11/11/2007 [facility] Patient Information 2015 [employer] [place], [place]. This information is not intended to replace advice given to you by your health care provider. Make sure you discuss any questions you have with your health care provider. You Can Quit Smoking If you are ready to quit smoking or are thinking about it, congratulations! You have chosen to help yourself be healthier and live longer! There are lots of different ways to quit smoking. Nicotine gum, nicotine patches, a nicotine inhaler, or nicotine nasal spray can help with physical craving. Hypnosis, support groups, and medicines help break the habit of smoking. TIPS TO GET OFF AND STAY OFF CIGARETTES  Learn to predict your moods. Do not let a bad situation be your excuse to have a cigarette. Some situations in your life might tempt you to  have a cigarette.  Ask friends and co-workers not to smoke around you.  Make your home smoke-free.  Never have "just one" cigarette. It leads to wanting another and another. Remind yourself of your  decision to quit.  On a card, make a list of your reasons for not smoking. Read it at least the same number of times a day as you have a cigarette. Tell yourself everyday, "I do not want to smoke. I choose not to smoke."  Ask someone at home or work to help you with your plan to quit smoking.  Have something planned after you eat or have a cup of coffee. Take a walk or get other exercise to perk you up. This will help to keep you from overeating.  Try a relaxation exercise to calm you down and decrease your stress. Remember, you may be tense and nervous the first two weeks after you quit. This will pass.  Find new activities to keep your hands busy. Play with a pen, coin, or rubber band. Doodle or draw things on paper.  Brush your teeth right after eating. This will help cut down the craving for the taste of tobacco after meals. You can try mouthwash too.  Try gum, breath mints, or diet candy to keep something in your mouth. IF YOU SMOKE AND WANT TO QUIT:  Do not stock up on cigarettes. Never buy a carton. Wait until one pack is finished before you buy another.  Never carry cigarettes with you at work or at home.  Keep cigarettes as far away from you as possible. Leave them with someone else.  Never carry matches or a lighter with you.  Ask yourself, "Do I need this cigarette or is this just a reflex?"  Bet with someone that you can quit. Put cigarette money in a piggy bank every morning. If you smoke, you give up the money. If you do not smoke, by the end of the week, you keep the money.  Keep trying. It takes 21 days to change a habit!  Talk to your doctor about using medicines to help you quit. These include nicotine replacement gum, lozenges, or skin patches. Document Released: 01/17/2009 Document Revised: 06/15/2011 Document Reviewed: 01/17/2009 [facility] Patient Information 2015 [place], [place]. This information is not intended to replace advice given to you by your health care  provider. Make sure you discuss any questions you have with your health care provider.

## 2014-10-18 NOTE — Progress Notes (Signed)
BP 117/82 mmHg  Temp(Src) 98.5 F (36.9 C)  Ht  (1.676 m)  Wt 210 lb 14.4 oz (95.664 kg)  BMI 34.06 kg/m2  SpO2 97%  LMP  (LMP Unknown)   Subjective:    Patient ID: Margaret [name], female    DOB: 1971/03/04, 44 y.o.   MRN: [id number]  HPI: Margaret [name] is a 44 y.o. female who presents today to establish care.   Chief Complaint  Patient presents with  . Anemia    patient donates plasma, she needs to be evaluated for anemia  . vaginal discharge    patient states that she has a fishy smell  . Hypothyroidism    patient states that she would like to know if her thyroid medication needs to be changed   HYPOTHYROIDISM- first diagnosed in 2008, has been being treated by [facility].  Thyroid control status:uncontrolled Satisfied with current treatment? no Medication side effects: no Medication compliance: fair compliance Etiology of hypothyroidism: unknown Recent dose adjustment:yes Fatigue: yes Cold intolerance: yes Heat intolerance: no Weight gain: yes Weight loss: no Constipation: yes Diarrhea/loose stools: no Palpitations: yes Lower extremity edema: no Anxiety/depressed mood: yes- had been on medication previously. Had been on drugs previously, but came off of it. Was on medication previously.   ANEMIA- had problem with her stomach lining and had anemia at that time but it was 20years ago. Had been donating 2x a week for a couple of months or every other week.  Anemia status: uncontrolled Etiology of anemia: Duration of anemia treatment:  Compliance with treatment: not on anything Iron supplementation side effects: no Severity of anemia: mild Fatigue: yes Decreased exercise tolerance: yes  Dyspnea on exertion: yes Palpitations: yes Bleeding: no- menses irregular and very heavy Pica: yes  VAGINAL DISCHARGE Duration: off and on for couple of months Discharge description: unclear Pruritus: yes Dysuria: no Malodorous: yes  Urinary  frequency: yes  Fevers:  no  Abdominal pain: no   Sexual activity: yes, monogamous History of sexually transmitted diseases: no Recent antibiotic use: no Context: worse Treatments attempted: none  Relevant past medical, surgical, family and social history reviewed and updated as indicated. Interim medical history since our last visit reviewed. Allergies and medications reviewed and updated.  Review of Systems  Constitutional: Negative.   Respiratory: Negative.   Cardiovascular: Negative.   Gastrointestinal: Negative.   Genitourinary: Negative.   Hematological: Negative.   Psychiatric/Behavioral: Negative.     Per HPI unless specifically indicated above     Objective:    BP 117/82 mmHg  Temp(Src) 98.5 F (36.9 C)  Ht  (1.676 m)  Wt 210 lb 14.4 oz (95.664 kg)  BMI 34.06 kg/m2  SpO2 97%  LMP  (LMP Unknown)  Wt Readings from Last 3 Encounters:  10/18/14 210 lb 14.4 oz (95.664 kg)  08/26/14 202 lb (91.627 kg)    Physical Exam  Constitutional: She is oriented to person, place, and time. She appears well-developed and well-nourished. No distress.  HENT:  Head: Normocephalic and atraumatic.  Right Ear: Hearing normal.  Left Ear: Hearing normal.  Nose: Nose normal.  Eyes: Conjunctivae and lids are normal. Right eye exhibits no discharge. Left eye exhibits no discharge. No scleral icterus.  Cardiovascular: Normal rate, regular rhythm, normal heart sounds and intact distal pulses.  Exam reveals no gallop and no friction rub.   No murmur heard. Pulmonary/Chest: Effort normal. No respiratory distress. She has no wheezes. She has no rales. She exhibits no tenderness.  Genitourinary: Vaginal discharge found.  Musculoskeletal: Normal range of motion.  Neurological: She is alert and oriented to person, place, and time.  Skin: Skin is warm, dry and intact. No rash noted. No erythema. There is pallor.  Psychiatric: She has a normal mood and affect. Her speech is normal and  behavior is normal. Judgment and thought content normal. Cognition and memory are normal.    Results for orders placed or performed during the hospital encounter of 08/26/14  Urinalysis complete, with microscopic [facility])  Result Value Ref Range   Color, Urine YELLOW (A) YELLOW   APPearance CLEAR (A) CLEAR   Glucose, UA NEGATIVE NEGATIVE mg/dL   Bilirubin Urine NEGATIVE NEGATIVE   Ketones, ur NEGATIVE NEGATIVE mg/dL   Specific Gravity, Urine 1.010 1.005 - 1.030   Hgb urine dipstick NEGATIVE NEGATIVE   pH 6.0 5.0 - 8.0   Protein, ur NEGATIVE NEGATIVE mg/dL   Nitrite NEGATIVE NEGATIVE   Leukocytes, UA NEGATIVE NEGATIVE   RBC / HPF NONE SEEN 0 - 5 RBC/hpf   WBC, UA 0-5 0 - 5 WBC/hpf   Bacteria, UA NONE SEEN NONE SEEN   Squamous Epithelial / LPF 0-5 (A) NONE SEEN   Mucous PRESENT   Pregnancy, urine POC  Result Value Ref Range   Preg Test, Ur NEGATIVE NEGATIVE      Assessment & Plan:   Problem List Items Addressed This Visit      Endocrine   Thyroid activity decreased    Currently on treatment, but not taking it regularly. This is probably the reason why her dose has been increased and explain most of her symptoms. We will check TSH today and adjust as needed. Stressed the importance of taking her medicine every day to be able to adjust the dose properly. Education given today.       Relevant Medications   levothyroxine (SYNTHROID, LEVOTHROID) 137 MCG tablet   Other Relevant Orders   Comprehensive metabolic panel   TSH   UA/M w/rflx Culture, Routine     Other   Abnormal CBC - Primary    Not anemic today. Possibly due to over-donating. Will check iron studies and B12/folate. Will not sign off on her donating plasma 2x a week- possibly can donate 1-2x per month pending results. Will fill out paperwork following her physical.       Relevant Orders   CBC With Differential/Platelet   Comprehensive metabolic panel   UA/M w/rflx Culture, Routine   IBC panel   Ferritin   Folate    B12   Tobacco abuse    Work on cutting back. Will check UA today to check for bladder cancer.       Relevant Orders   UA/M w/rflx Culture, Routine    Other Visit Diagnoses    Vaginal discharge        Wet prep checked today. Showed nothing. Will do pap and pelvic next visit.     Relevant Orders    WET PREP FOR TRICH, YEAST, CLUE    Abnormal menstrual periods        Likely due to untreated hypothyroidism. Pregnancy negative today. Will adjust synthroid as needed.     Relevant Orders    Pregnancy, urine        Follow up plan: Return for ASAP for PE- records from [facility].

## 2014-10-18 NOTE — Assessment & Plan Note (Signed)
Currently on treatment, but not taking it regularly. This is probably the reason why her dose has been increased and explain most of her symptoms. We will check TSH today and adjust as needed. Stressed the importance of taking her medicine every day to be able to adjust the dose properly. Education given today.

## 2014-10-18 NOTE — Assessment & Plan Note (Signed)
Work on cutting back. Will check UA today to check for bladder cancer.

## 2014-10-19 ENCOUNTER — Other Ambulatory Visit: Payer: Self-pay | Admitting: Infectious Diseases

## 2014-10-19 ENCOUNTER — Ambulatory Visit
Admission: RE | Admit: 2014-10-19 | Discharge: 2014-10-19 | Disposition: A | Discharge status: Home / Self Care | Payer: Medicaid Other | Source: Ambulatory Visit | Attending: Infectious Diseases | Admitting: Infectious Diseases

## 2014-10-19 ENCOUNTER — Telehealth: Payer: Self-pay | Admitting: Family Medicine

## 2014-10-19 DIAGNOSIS — R7611 Nonspecific reaction to tuberculin skin test without active tuberculosis: Secondary | ICD-10-CM

## 2014-10-19 DIAGNOSIS — E039 Hypothyroidism, unspecified: Secondary | ICD-10-CM

## 2014-10-19 LAB — COMPREHENSIVE METABOLIC PANEL
ALBUMIN: 3.8 g/dL (ref 3.5–5.5)
ALK PHOS: 75 IU/L (ref 39–117)
ALT: 7 IU/L (ref 0–32)
AST: 18 IU/L (ref 0–40)
Albumin/Globulin Ratio: 1.5 (ref 1.1–2.5)
BILIRUBIN TOTAL: 0.2 mg/dL (ref 0.0–1.2)
BUN/Creatinine Ratio: 11 (ref 9–23)
BUN: 7 mg/dL (ref 6–24)
CHLORIDE: 101 mmol/L (ref 97–108)
CO2: 25 mmol/L (ref 18–29)
Calcium: 8.9 mg/dL (ref 8.7–10.2)
Creatinine, Ser: 0.65 mg/dL (ref 0.57–1.00)
GFR calc Af Amer: 127 mL/min/{1.73_m2} (ref >59)
GFR, EST NON AFRICAN AMERICAN: 110 mL/min/{1.73_m2} (ref >59)
GLOBULIN, TOTAL: 2.6 g/dL (ref 1.5–4.5)
GLUCOSE: 78 mg/dL (ref 65–99)
POTASSIUM: 3.7 mmol/L (ref 3.5–5.2)
Sodium: 139 mmol/L (ref 134–144)
Total Protein: 6.4 g/dL (ref 6.0–8.5)

## 2014-10-19 LAB — FERRITIN: Ferritin: 22 ng/mL (ref 15–150)

## 2014-10-19 LAB — VITAMIN B12: Vitamin B-12: 369 pg/mL (ref 211–946)

## 2014-10-19 LAB — TSH: TSH: 6.5 u[IU]/mL — ABNORMAL HIGH (ref 0.450–4.500)

## 2014-10-19 LAB — FOLATE: Folate: 13.7 ng/mL (ref >3.0)

## 2014-10-19 NOTE — Telephone Encounter (Signed)
Pt has question about adding lab test to blood taken yesterday.  Please call her to advise.

## 2014-10-19 NOTE — Telephone Encounter (Signed)
Patient notified of results and that we could not add the TB test, she scheduled an appointment for next week to have that drawn.

## 2014-10-19 NOTE — Telephone Encounter (Signed)
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

## 2014-10-19 NOTE — Telephone Encounter (Addendum)
Unfortunately, they can't add that one on. She can come in and get restuck if she'd like and I'll put that order in. When you talk to her let her know her labs came back good except her thyroid which was underactive. We are not going to change her medicine, but she should take it EVERY DAY and then come back in in 6 weeks and we'll recheck it and adjust the dose as we need to her knowing she has actually been taking her medicine.

## 2014-10-22 ENCOUNTER — Other Ambulatory Visit: Payer: Medicaid Other

## 2014-10-22 DIAGNOSIS — R7611 Nonspecific reaction to tuberculin skin test without active tuberculosis: Secondary | ICD-10-CM

## 2014-10-24 ENCOUNTER — Encounter: Payer: Self-pay | Admitting: Family Medicine

## 2014-10-24 LAB — QUANTIFERON IN TUBE
QFT TB AG MINUS NIL VALUE: 0 IU/mL
QUANTIFERON TB AG VALUE: 0.04 [IU]/mL
QUANTIFERON TB GOLD: NEGATIVE
Quantiferon Nil Value: 0.04 IU/mL

## 2014-10-24 LAB — IRON AND TIBC
Iron Saturation: 16 % (ref 15–55)
Iron: 42 ug/dL (ref 27–159)
Total Iron Binding Capacity: 260 ug/dL (ref 250–450)
UIBC: 218 ug/dL (ref 131–425)

## 2014-10-24 LAB — QUANTIFERON TB GOLD ASSAY (BLOOD)

## 2014-10-24 LAB — SPECIMEN STATUS REPORT

## 2014-10-25 ENCOUNTER — Encounter: Payer: Self-pay | Admitting: Unknown Physician Specialty

## 2014-11-07 ENCOUNTER — Encounter: Payer: Medicaid Other | Admitting: Family Medicine

## 2014-11-21 ENCOUNTER — Ambulatory Visit (INDEPENDENT_AMBULATORY_CARE_PROVIDER_SITE_OTHER): Payer: Medicaid Other | Admitting: Family Medicine

## 2014-11-21 ENCOUNTER — Encounter: Payer: Self-pay | Admitting: Family Medicine

## 2014-11-21 VITALS — BP 115/84 | HR 82 | Temp 98.6°F | Wt 206.2 lb

## 2014-11-21 DIAGNOSIS — M79601 Pain in right arm: Secondary | ICD-10-CM

## 2014-11-21 NOTE — Progress Notes (Signed)
BP 115/84 mmHg  Pulse 82  Temp(Src) 98.6 F (37 C)  Wt 206 lb 3.2 oz (93.532 kg)  SpO2 99%  LMP  (LMP Unknown)   Subjective:    Patient ID: Margaret [name], female    DOB: December 14, 1970, 44 y.o.   MRN: [id number]  HPI: Margaret [name] is a 44 y.o. female  Chief Complaint  Patient presents with  . Arm Pain    patient had a fall a long time ago. Patient has been having sharpe pains in rt arm. She does have some weakness in the arm and hand. She also has a lump below the elbow that has appeared.   ARM PAIN Duration: lump has been there a couple of months, but pain started within the past couple of days and radiates up her arm into her neck and head Location: R forearm elbow Mechanism of injury: unknown Onset: sudden Severity: moderate  Quality:  sharp, shooting and stabbing Frequency: constant Radiation: yes into her neck and head Aggravating factors: lifting and carrying and movement  Alleviating factors: nothing and rest  Status: worse Treatments attempted: rest, ice, heat, APAP and ibuprofen  Relief with NSAIDs?:  no Swelling: yes Redness: no  Warmth: no Trauma: no Chest pain: no  Shortness of breath: no  Fever: no Decreased sensation: no Paresthesias: no Weakness: yes  Relevant past medical, surgical, family and social history reviewed and updated as indicated. Interim medical history since our last visit reviewed. Allergies and medications reviewed and updated.  Review of Systems  Respiratory: Negative.   Cardiovascular: Negative.   Musculoskeletal: Positive for myalgias, joint swelling and arthralgias. Negative for back pain, gait problem, neck pain and neck stiffness.  Neurological: Positive for weakness and numbness. Negative for dizziness, tremors, seizures, syncope, facial asymmetry, speech difficulty, light-headedness and headaches.  Psychiatric/Behavioral: Negative.    Per HPI unless specifically indicated above     Objective:    BP 115/84 mmHg   Pulse 82  Temp(Src) 98.6 F (37 C)  Wt 206 lb 3.2 oz (93.532 kg)  SpO2 99%  LMP  (LMP Unknown)  Wt Readings from Last 3 Encounters:  11/21/14 206 lb 3.2 oz (93.532 kg)  10/18/14 210 lb 14.4 oz (95.664 kg)  08/26/14 202 lb (91.627 kg)    Physical Exam  Constitutional: She is oriented to person, place, and time. She appears well-developed and well-nourished. No distress.  HENT:  Head: Normocephalic and atraumatic.  Right Ear: Hearing normal.  Left Ear: Hearing normal.  Nose: Nose normal.  Eyes: Conjunctivae and lids are normal. Right eye exhibits no discharge. Left eye exhibits no discharge. No scleral icterus.  Cardiovascular: Normal heart sounds.   Pulmonary/Chest: Effort normal. No respiratory distress.  Musculoskeletal: Normal range of motion. She exhibits tenderness. She exhibits no edema.  Neurological: She is alert and oriented to person, place, and time. She has normal reflexes. No cranial nerve deficit or sensory deficit.  Strength 4/5 throughout R arm 3cm soft, mobile lump 2 in distal to the olecranon on the posterior side of the forearm  Skin: Skin is warm, dry and intact. No rash noted. No erythema. No pallor.  Psychiatric: She has a normal mood and affect. Her speech is normal and behavior is normal. Judgment and thought content normal. Cognition and memory are normal.  Nursing note and vitals reviewed.   Results for orders placed or performed in visit on 10/22/14  Quantiferon tb gold assay (blood)  Result Value Ref Range   QUANTIFERON INCUBATION Comment  QuantiFERON In Tube  Result Value Ref Range   QUANTIFERON TB GOLD Negative Negative   QUANTIFERON CRITERIA Comment    QUANTIFERON TB AG VALUE 0.04 IU/mL   Quantiferon Nil Value 0.04 IU/mL   QUANTIFERON MITOGEN VALUE >10.00 IU/mL   QFT TB AG MINUS NIL VALUE 0.00 IU/mL   Interpretation: Comment       Assessment & Plan:   Problem List Items Addressed This Visit      Other   Right arm pain - Primary     Concerning for lipoma vs sarcoma with possible nerve involvement given the weakness. Normal sensation exam, normal DTRs. Will refer to ortho so they can get the tests they want. Referral generated today. Rest for the time being. Close follow up.      Relevant Orders   Ambulatory referral to Orthopedic Surgery       Follow up plan: Return for Pending Referral.

## 2014-11-21 NOTE — Assessment & Plan Note (Signed)
Concerning for lipoma vs sarcoma with possible nerve involvement given the weakness. Normal sensation exam, normal DTRs. Will refer to ortho so they can get the tests they want. Referral generated today. Rest for the time being. Close follow up.

## 2014-11-27 ENCOUNTER — Other Ambulatory Visit: Payer: Self-pay | Admitting: Orthopedic Surgery

## 2014-11-29 ENCOUNTER — Telehealth: Payer: Self-pay | Admitting: Family Medicine

## 2014-11-29 NOTE — Telephone Encounter (Signed)
Called and spoke with patient, Dr.Johnson said that she needed a CPE for the form to be filled out. Appt scheduled

## 2014-11-29 NOTE — Telephone Encounter (Signed)
Pt called in reference to a paper from [employer] that she needs completed and returned to her. Please call when form is ready for pick up. Thanks.

## 2014-11-30 ENCOUNTER — Other Ambulatory Visit: Payer: Self-pay | Admitting: Orthopedic Surgery

## 2014-11-30 DIAGNOSIS — M7989 Other specified soft tissue disorders: Secondary | ICD-10-CM

## 2014-12-03 ENCOUNTER — Ambulatory Visit: Payer: Medicaid Other

## 2014-12-12 ENCOUNTER — Ambulatory Visit: Payer: Medicaid Other

## 2014-12-18 ENCOUNTER — Ambulatory Visit: Payer: Medicaid Other

## 2014-12-20 ENCOUNTER — Encounter: Payer: Self-pay | Admitting: Family Medicine

## 2014-12-20 ENCOUNTER — Ambulatory Visit (INDEPENDENT_AMBULATORY_CARE_PROVIDER_SITE_OTHER): Payer: Medicaid Other | Admitting: Family Medicine

## 2014-12-20 VITALS — BP 107/77 | HR 101 | Temp 98.3°F | Ht 65.5 in | Wt 207.0 lb

## 2014-12-20 DIAGNOSIS — M79601 Pain in right arm: Secondary | ICD-10-CM

## 2014-12-20 DIAGNOSIS — H5213 Myopia, bilateral: Secondary | ICD-10-CM | POA: Diagnosis not present

## 2014-12-20 DIAGNOSIS — E039 Hypothyroidism, unspecified: Secondary | ICD-10-CM | POA: Diagnosis not present

## 2014-12-20 DIAGNOSIS — Z23 Encounter for immunization: Secondary | ICD-10-CM | POA: Diagnosis not present

## 2014-12-20 DIAGNOSIS — L298 Other pruritus: Secondary | ICD-10-CM

## 2014-12-20 DIAGNOSIS — Z13220 Encounter for screening for lipoid disorders: Secondary | ICD-10-CM | POA: Diagnosis not present

## 2014-12-20 DIAGNOSIS — N898 Other specified noninflammatory disorders of vagina: Secondary | ICD-10-CM

## 2014-12-20 DIAGNOSIS — R7989 Other specified abnormal findings of blood chemistry: Secondary | ICD-10-CM | POA: Diagnosis not present

## 2014-12-20 DIAGNOSIS — M799 Soft tissue disorder, unspecified: Secondary | ICD-10-CM

## 2014-12-20 DIAGNOSIS — Z0000 Encounter for general adult medical examination without abnormal findings: Secondary | ICD-10-CM | POA: Diagnosis not present

## 2014-12-20 DIAGNOSIS — Z113 Encounter for screening for infections with a predominantly sexual mode of transmission: Secondary | ICD-10-CM

## 2014-12-20 DIAGNOSIS — R1031 Right lower quadrant pain: Secondary | ICD-10-CM | POA: Diagnosis not present

## 2014-12-20 DIAGNOSIS — F329 Major depressive disorder, single episode, unspecified: Secondary | ICD-10-CM | POA: Insufficient documentation

## 2014-12-20 DIAGNOSIS — R109 Unspecified abdominal pain: Secondary | ICD-10-CM | POA: Insufficient documentation

## 2014-12-20 DIAGNOSIS — F32A Depression, unspecified: Secondary | ICD-10-CM

## 2014-12-20 DIAGNOSIS — H5210 Myopia, unspecified eye: Secondary | ICD-10-CM | POA: Insufficient documentation

## 2014-12-20 DIAGNOSIS — Z720 Tobacco use: Secondary | ICD-10-CM

## 2014-12-20 DIAGNOSIS — M7989 Other specified soft tissue disorders: Secondary | ICD-10-CM

## 2014-12-20 LAB — WET PREP FOR TRICH, YEAST, CLUE
Clue Cell Exam: NEGATIVE
Trichomonas Exam: NEGATIVE
Yeast Exam: NEGATIVE

## 2014-12-20 MED ORDER — ADULT MULTIVITAMIN W/MINERALS CH: 1.0000 | ORAL_TABLET | Freq: Every day | ORAL | Status: DC

## 2014-12-20 NOTE — Assessment & Plan Note (Signed)
Continue to follow with her orthopedist. Get her MRI next week.

## 2014-12-20 NOTE — Assessment & Plan Note (Signed)
Possibly cyst. If not better, she will let us know and we will get ultrasound to check on it.

## 2014-12-20 NOTE — Assessment & Plan Note (Signed)
Encouraged her to quit and information given today.

## 2014-12-20 NOTE — Assessment & Plan Note (Signed)
Patient did not want to discuss this. Would like multivitamin, which was given. Possibly due to her hypothyroid. Await results. Made patient aware that we are here to discuss it if she changes her mind.

## 2014-12-20 NOTE — Assessment & Plan Note (Signed)
Has not been good about taking her medicine, but taking it more days than not. Rechecking today, will adjust as needed.

## 2014-12-20 NOTE — Assessment & Plan Note (Signed)
Likely the cause of her headaches. Referral to opthalmology made. She is aware her insurance won't pay for glasses, but that she can look for cheaper options once she has a prescription

## 2014-12-20 NOTE — Patient Instructions (Signed)
You Can Quit Smoking If you are ready to quit smoking or are thinking about it, congratulations! You have chosen to help yourself be healthier and live longer! There are lots of different ways to quit smoking. Nicotine gum, nicotine patches, a nicotine inhaler, or nicotine nasal spray can help with physical craving. Hypnosis, support groups, and medicines help break the habit of smoking. TIPS TO GET OFF AND STAY OFF CIGARETTES  Learn to predict your moods. Do not let a bad situation be your excuse to have a cigarette. Some situations in your life might tempt you to have a cigarette.  Ask friends and co-workers not to smoke around you.  Make your home smoke-free.  Never have "just one" cigarette. It leads to wanting another and another. Remind yourself of your decision to quit.  On a card, make a list of your reasons for not smoking. Read it at least the same number of times a day as you have a cigarette. Tell yourself everyday, "I do not want to smoke. I choose not to smoke."  Ask someone at home or work to help you with your plan to quit smoking.  Have something planned after you eat or have a cup of coffee. Take a walk or get other exercise to perk you up. This will help to keep you from overeating.  Try a relaxation exercise to calm you down and decrease your stress. Remember, you may be tense and nervous the first two weeks after you quit. This will pass.  Find new activities to keep your hands busy. Play with a pen, coin, or rubber band. Doodle or draw things on paper.  Brush your teeth right after eating. This will help cut down the craving for the taste of tobacco after meals. You can try mouthwash too.  Try gum, breath mints, or diet candy to keep something in your mouth. IF YOU SMOKE AND WANT TO QUIT:  Do not stock up on cigarettes. Never buy a carton. Wait until one pack is finished before you buy another.  Never carry cigarettes with you at work or at home.  Keep cigarettes  as far away from you as possible. Leave them with someone else.  Never carry matches or a lighter with you.  Ask yourself, "Do I need this cigarette or is this just a reflex?"  Bet with someone that you can quit. Put cigarette money in a piggy bank every morning. If you smoke, you give up the money. If you do not smoke, by the end of the week, you keep the money.  Keep trying. It takes 21 days to change a habit!  Talk to your doctor about using medicines to help you quit. These include nicotine replacement gum, lozenges, or skin patches. Document Released: 01/17/2009 Document Revised: 06/15/2011 Document Reviewed: 01/17/2009 [facility] Patient Information 2015 [place], [place]. This information is not intended to replace advice given to you by your health care provider. Make sure you discuss any questions you have with your health care provider. Hypothyroidism The thyroid is a large gland located in the lower front of your neck. The thyroid gland helps control metabolism. Metabolism is how your body handles food. It controls metabolism with the hormone thyroxine. When this gland is underactive (hypothyroid), it produces too little hormone.  CAUSES These include:   Absence or destruction of thyroid tissue.  Goiter due to iodine deficiency.  Goiter due to medications.  Congenital defects (since birth).  Problems with the pituitary. This causes a lack of  TSH (thyroid stimulating hormone). This hormone tells the thyroid to turn out more hormone. SYMPTOMS  Lethargy (feeling as though you have no energy)  Cold intolerance  Weight gain (in spite of normal food intake)  Dry skin  Coarse hair  Menstrual irregularity (if severe, may lead to infertility)  Slowing of thought processes Cardiac problems are also caused by insufficient amounts of thyroid hormone. Hypothyroidism in the newborn is cretinism, and is an extreme form. It is important that this form be treated adequately and  immediately or it will lead rapidly to retarded physical and mental development. DIAGNOSIS  To prove hypothyroidism, your caregiver may do blood tests and ultrasound tests. Sometimes the signs are hidden. It may be necessary for your caregiver to watch this illness with blood tests either before or after diagnosis and treatment. TREATMENT  Low levels of thyroid hormone are increased by using synthetic thyroid hormone. This is a safe, effective treatment. It usually takes about four weeks to gain the full effects of the medication. After you have the full effect of the medication, it will generally take another four weeks for problems to leave. Your caregiver may start you on low doses. If you have had heart problems the dose may be gradually increased. It is generally not an emergency to get rapidly to normal. HOME CARE INSTRUCTIONS   Take your medications as your caregiver suggests. Let your caregiver know of any medications you are taking or start taking. Your caregiver will help you with dosage schedules.  As your condition improves, your dosage needs may increase. It will be necessary to have continuing blood tests as suggested by your caregiver.  Report all suspected medication side effects to your caregiver. SEEK MEDICAL CARE IF: Seek medical care if you develop:  Sweating.  Tremulousness (tremors).  Anxiety.  Rapid weight loss.  Heat intolerance.  Emotional swings.  Diarrhea.  Weakness. SEEK IMMEDIATE MEDICAL CARE IF:  You develop chest pain, an irregular heart beat (palpitations), or a rapid heart beat. MAKE SURE YOU:   Understand these instructions.  Will watch your condition.  Will get help right away if you are not doing well or get worse. Document Released: 03/23/2005 Document Revised: 06/15/2011 Document Reviewed: 11/11/2007 [facility] Patient Information 2015 [place], [place]. This information is not intended to replace advice given to you by your health care  provider. Make sure you discuss any questions you have with your health care provider. Health Maintenance Adopting a healthy lifestyle and getting preventive care can go a long way to promote health and wellness. Talk with your health care provider about what schedule of regular examinations is right for you. This is a good chance for you to check in with your provider about disease prevention and staying healthy. In between checkups, there are plenty of things you can do on your own. Experts have done a lot of research about which lifestyle changes and preventive measures are most likely to keep you healthy. Ask your health care provider for more information. WEIGHT AND DIET  Eat a healthy diet  Be sure to include plenty of vegetables, fruits, low-fat dairy products, and lean protein.  Do not eat a lot of foods high in solid fats, added sugars, or salt.  Get regular exercise. This is one of the most important things you can do for your health.  Most adults should exercise for at least 150 minutes each week. The exercise should increase your heart rate and make you sweat (moderate-intensity exercise).  Most adults  should also do strengthening exercises at least twice a week. This is in addition to the moderate-intensity exercise.  Maintain a healthy weight  Body mass index (BMI) is a measurement that can be used to identify possible weight problems. It estimates body fat based on height and weight. Your health care provider can help determine your BMI and help you achieve or maintain a healthy weight.  For females 32 years of age and older:   A BMI below 18.5 is considered underweight.  A BMI of 18.5 to 24.9 is normal.  A BMI of 25 to 29.9 is considered overweight.  A BMI of 30 and above is considered obese.  Watch levels of cholesterol and blood lipids  You should start having your blood tested for lipids and cholesterol at 43 years of age, then have this test every 5 years.  You  may need to have your cholesterol levels checked more often if:  Your lipid or cholesterol levels are high.  You are older than 43 years of age.  You are at high risk for heart disease.  CANCER SCREENING   Lung Cancer  Lung cancer screening is recommended for adults 26-45 years old who are at high risk for lung cancer because of a history of smoking.  A yearly low-dose CT scan of the lungs is recommended for people who:  Currently smoke.  Have quit within the past 15 years.  Have at least a 30-pack-year history of smoking. A pack year is smoking an average of one pack of cigarettes a day for 1 year.  Yearly screening should continue until it has been 15 years since you quit.  Yearly screening should stop if you develop a health problem that would prevent you from having lung cancer treatment.  Breast Cancer  Practice breast self-awareness. This means understanding how your breasts normally appear and feel.  It also means doing regular breast self-exams. Let your health care provider know about any changes, no matter how small.  If you are in your 20s or 30s, you should have a clinical breast exam (CBE) by a health care provider every 1-3 years as part of a regular health exam.  If you are 24 or older, have a CBE every year. Also consider having a breast X-ray (mammogram) every year.  If you have a family history of breast cancer, talk to your health care provider about genetic screening.  If you are at high risk for breast cancer, talk to your health care provider about having an MRI and a mammogram every year.  Breast cancer gene (BRCA) assessment is recommended for women who have family members with BRCA-related cancers. BRCA-related cancers include:  Breast.  Ovarian.  Tubal.  Peritoneal cancers.  Results of the assessment will determine the need for genetic counseling and BRCA1 and BRCA2 testing. Cervical Cancer Routine pelvic examinations to screen for  cervical cancer are no longer recommended for nonpregnant women who are considered low risk for cancer of the pelvic organs (ovaries, uterus, and vagina) and who do not have symptoms. A pelvic examination may be necessary if you have symptoms including those associated with pelvic infections. Ask your health care provider if a screening pelvic exam is right for you.   The Pap test is the screening test for cervical cancer for women who are considered at risk.  If you had a hysterectomy for a problem that was not cancer or a condition that could lead to cancer, then you no longer need Pap tests.  If you are older than 65 years, and you have had normal Pap tests for the past 10 years, you no longer need to have Pap tests.  If you have had past treatment for cervical cancer or a condition that could lead to cancer, you need Pap tests and screening for cancer for at least 20 years after your treatment.  If you no longer get a Pap test, assess your risk factors if they change (such as having a new sexual partner). This can affect whether you should start being screened again.  Some women have medical problems that increase their chance of getting cervical cancer. If this is the case for you, your health care provider may recommend more frequent screening and Pap tests.  The human papillomavirus (HPV) test is another test that may be used for cervical cancer screening. The HPV test looks for the virus that can cause cell changes in the cervix. The cells collected during the Pap test can be tested for HPV.  The HPV test can be used to screen women 39 years of age and older. Getting tested for HPV can extend the interval between normal Pap tests from three to five years.  An HPV test also should be used to screen women of any age who have unclear Pap test results.  After 43 years of age, women should have HPV testing as often as Pap tests.  Colorectal Cancer  This type of cancer can be detected and  often prevented.  Routine colorectal cancer screening usually begins at 43 years of age and continues through 43 years of age.  Your health care provider may recommend screening at an earlier age if you have risk factors for colon cancer.  Your health care provider may also recommend using home test kits to check for hidden blood in the stool.  A small camera at the end of a tube can be used to examine your colon directly (sigmoidoscopy or colonoscopy). This is done to check for the earliest forms of colorectal cancer.  Routine screening usually begins at age 42.  Direct examination of the colon should be repeated every 5-10 years through 43 years of age. However, you may need to be screened more often if early forms of precancerous polyps or small growths are found. Skin Cancer  Check your skin from head to toe regularly.  Tell your health care provider about any new moles or changes in moles, especially if there is a change in a mole's shape or color.  Also tell your health care provider if you have a mole that is larger than the size of a pencil eraser.  Always use sunscreen. Apply sunscreen liberally and repeatedly throughout the day.  Protect yourself by wearing long sleeves, pants, a wide-brimmed hat, and sunglasses whenever you are outside. HEART DISEASE, DIABETES, AND HIGH BLOOD PRESSURE   Have your blood pressure checked at least every 1-2 years. High blood pressure causes heart disease and increases the risk of stroke.  If you are between 78 years and 47 years old, ask your health care provider if you should take aspirin to prevent strokes.  Have regular diabetes screenings. This involves taking a blood sample to check your fasting blood sugar level.  If you are at a normal weight and have a low risk for diabetes, have this test once every three years after 43 years of age.  If you are overweight and have a high risk for diabetes, consider being tested at a younger age or  more often. PREVENTING INFECTION  Hepatitis B  If you have a higher risk for hepatitis B, you should be screened for this virus. You are considered at high risk for hepatitis B if:  You were born in a country where hepatitis B is common. Ask your health care provider which countries are considered high risk.  Your parents were born in a high-risk country, and you have not been immunized against hepatitis B (hepatitis B vaccine).  You have HIV or AIDS.  You use needles to inject street drugs.  You live with someone who has hepatitis B.  You have had sex with someone who has hepatitis B.  You get hemodialysis treatment.  You take certain medicines for conditions, including cancer, organ transplantation, and autoimmune conditions. Hepatitis C  Blood testing is recommended for:  Everyone born from [birth year] through 1964.  Anyone with known risk factors for hepatitis C. Sexually transmitted infections (STIs)  You should be screened for sexually transmitted infections (STIs) including gonorrhea and chlamydia if:  You are sexually active and are younger than 43 years of age.  You are older than 43 years of age and your health care provider tells you that you are at risk for this type of infection.  Your sexual activity has changed since you were last screened and you are at an increased risk for chlamydia or gonorrhea. Ask your health care provider if you are at risk.  If you do not have HIV, but are at risk, it may be recommended that you take a prescription medicine daily to prevent HIV infection. This is called pre-exposure prophylaxis (PrEP). You are considered at risk if:  You are sexually active and do not regularly use condoms or know the HIV status of your partner(s).  You take drugs by injection.  You are sexually active with a partner who has HIV. Talk with your health care provider about whether you are at high risk of being infected with HIV. If you choose to begin PrEP,  you should first be tested for HIV. You should then be tested every 3 months for as long as you are taking PrEP.  PREGNANCY   If you are premenopausal and you may become pregnant, ask your health care provider about preconception counseling.  If you may become pregnant, take 400 to 800 micrograms (mcg) of folic acid every day.  If you want to prevent pregnancy, talk to your health care provider about birth control (contraception). OSTEOPOROSIS AND MENOPAUSE   Osteoporosis is a disease in which the bones lose minerals and strength with aging. This can result in serious bone fractures. Your risk for osteoporosis can be identified using a bone density scan.  If you are 44 years of age or older, or if you are at risk for osteoporosis and fractures, ask your health care provider if you should be screened.  Ask your health care provider whether you should take a calcium or vitamin D supplement to lower your risk for osteoporosis.  Menopause may have certain physical symptoms and risks.  Hormone replacement therapy may reduce some of these symptoms and risks. Talk to your health care provider about whether hormone replacement therapy is right for you.  HOME CARE INSTRUCTIONS   Schedule regular health, dental, and eye exams.  Stay current with your immunizations.   Do not use any tobacco products including cigarettes, chewing tobacco, or electronic cigarettes.  If you are pregnant, do not drink alcohol.  If you are breastfeeding, limit how much and  how often you drink alcohol.  Limit alcohol intake to no more than 1 drink per day for nonpregnant women. One drink equals 12 ounces of beer, 5 ounces of wine, or 1 ounces of hard liquor.  Do not use street drugs.  Do not share needles.  Ask your health care provider for help if you need support or information about quitting drugs.  Tell your health care provider if you often feel depressed.  Tell your health care provider if you have  ever been abused or do not feel safe at home. Document Released: 10/06/2010 Document Revised: 08/07/2013 Document Reviewed: 02/22/2013 [facility] Patient Information 2015 [place], [place]. This information is not intended to replace advice given to you by your health care provider. Make sure you discuss any questions you have with your health care provider.

## 2014-12-20 NOTE — Progress Notes (Signed)
BP 107/77 mmHg  Pulse 101  Temp(Src) 98.3 F (36.8 C)  Ht 5' 5.5" (1.664 m)  Wt 207 lb (93.895 kg)  BMI 33.91 kg/m2  SpO2 99%  LMP  (LMP Unknown)   Subjective:    Patient ID: Margaret [name], female    DOB: 05/19/1970, 44 y.o.   MRN: [id number]  HPI: Margaret [name] is a 44 y.o. female presenting on 12/20/2014 for comprehensive medical examination. Current medical complaints include: Would like STI screening, having some vaginal itching  She currently lives with: her daughter Menopausal Symptoms: no  Depression Screen done today and results listed below:  Patient refused.   Discussed that patient is depressed, but doesn't want to discuss how she is feeling, just wants a multivitamin  Past Medical History:  Past Medical History  Diagnosis Date  . Thyroid disease   . Asthma     Surgical History:  Past Surgical History  Procedure Laterality Date  . Fracture surgery      Medications:  Current Outpatient Prescriptions on File Prior to Visit  Medication Sig  . levothyroxine (SYNTHROID, LEVOTHROID) 137 MCG tablet Take 137 mcg by mouth daily before breakfast.   No current facility-administered medications on file prior to visit.    Allergies:  No Known Allergies  Social History:  Social History   Social History  . Marital Status: Divorced    Spouse Name: N/A  . Number of Children: N/A  . Years of Education: N/A   Occupational History  . Not on file.   Social History Main Topics  . Smoking status: Current Every Day Smoker -- 0.25 packs/day  . Smokeless tobacco: Never Used  . Alcohol Use: 0.0 oz/week    0 Standard drinks or equivalent per week     Comment: on occasion  . Drug Use: No     Comment: Previously, but stopped years ago  . Sexual Activity:    Partners: Male    Pharmacist, hospital Protection: None   Other Topics Concern  . Not on file   Social History Narrative   History  Smoking status  . Current Every Day Smoker -- 0.25 packs/day   Smokeless tobacco  . Never Used   History  Alcohol Use  . 0.0 oz/week  . 0 Standard drinks or equivalent per week    Comment: on occasion    Family History:  Family History  Problem Relation Age of Onset  . Family history unknown: Yes    Past medical history, surgical history, medications, allergies, family history and social history reviewed with patient today and changes made to appropriate areas of the chart.   Review of Systems  Constitutional: Negative.   HENT: Negative for congestion, ear discharge, ear pain, hearing loss, nosebleeds, sore throat and tinnitus.   Eyes: Positive for blurred vision. Negative for double vision, photophobia, pain, discharge and redness.  Respiratory: Negative.  Negative for stridor.   Cardiovascular: Positive for palpitations. Negative for chest pain, claudication, leg swelling and PND.  Gastrointestinal: Positive for abdominal pain. Negative for heartburn, nausea, vomiting, diarrhea, constipation, blood in stool and melena.  Genitourinary: Negative.   Musculoskeletal: Negative.   Skin: Negative.   Neurological: Negative.   Endo/Heme/Allergies: Negative.   Psychiatric/Behavioral: Positive for depression. Negative for suicidal ideas, hallucinations, memory loss and substance abuse. The patient has insomnia. The patient is not nervous/anxious.     All other ROS negative except what is listed above and in the HPI.      Objective:  BP 107/77 mmHg  Pulse 101  Temp(Src) 98.3 F (36.8 C)  Ht 5' 5.5" (1.664 m)  Wt 207 lb (93.895 kg)  BMI 33.91 kg/m2  SpO2 99%  LMP  (LMP Unknown)  Wt Readings from Last 3 Encounters:  12/20/14 207 lb (93.895 kg)  11/21/14 206 lb 3.2 oz (93.532 kg)  10/18/14 210 lb 14.4 oz (95.664 kg)     Visual Acuity Screening   Right eye Left eye Both eyes  Without correction: 20/70 20/70 20/30   With correction:       Physical Exam  Constitutional: She is oriented to person, place, and time. She appears  well-developed and well-nourished. No distress.  HENT:  Head: Normocephalic and atraumatic.  Right Ear: Hearing and external ear normal.  Left Ear: Hearing and external ear normal.  Nose: Nose normal.  Mouth/Throat: Oropharynx is clear and moist. No oropharyngeal exudate.  Eyes: Conjunctivae and lids are normal. Pupils are equal, round, and reactive to light. Right eye exhibits no discharge. Left eye exhibits no discharge. No scleral icterus.  Neck: Normal range of motion. Neck supple. No JVD present. No tracheal deviation present. No thyromegaly present.  Cardiovascular: Normal rate, regular rhythm, normal heart sounds and intact distal pulses.  Exam reveals no gallop and no friction rub.   No murmur heard. Pulmonary/Chest: Effort normal and breath sounds normal. No stridor. No respiratory distress. She has no wheezes. She has no rales. She exhibits no tenderness.  Abdominal: Soft. Bowel sounds are normal. She exhibits no distension and no mass. There is no hepatosplenomegaly, splenomegaly or hepatomegaly. There is tenderness in the left upper quadrant and left lower quadrant. There is no rigidity, no rebound, no guarding, no CVA tenderness, no tenderness at McBurney's point and negative Murphy's sign.  Genitourinary: Vaginal discharge found.  Musculoskeletal: Normal range of motion. She exhibits no edema or tenderness.  Lymphadenopathy:    She has no cervical adenopathy.  Neurological: She is alert and oriented to person, place, and time. She has normal reflexes. She displays normal reflexes. No cranial nerve deficit. She exhibits normal muscle tone. Coordination normal.  Skin: Skin is warm, dry and intact. No rash noted. No erythema. No pallor.  Psychiatric: Her speech is normal and behavior is normal. Judgment and thought content normal. Her affect is blunt. Cognition and memory are normal. She exhibits a depressed mood.    Results for orders placed or performed in visit on 10/22/14   Quantiferon tb gold assay (blood)  Result Value Ref Range   QUANTIFERON INCUBATION Comment   QuantiFERON In Tube  Result Value Ref Range   QUANTIFERON TB GOLD Negative Negative   QUANTIFERON CRITERIA Comment    QUANTIFERON TB AG VALUE 0.04 IU/mL   Quantiferon Nil Value 0.04 IU/mL   QUANTIFERON MITOGEN VALUE >10.00 IU/mL   QFT TB AG MINUS NIL VALUE 0.00 IU/mL   Interpretation: Comment       Assessment & Plan:   Problem List Items Addressed This Visit      Endocrine   Thyroid activity decreased    Has not been good about taking her medicine, but taking it more days than not. Rechecking today, will adjust as needed.       Relevant Orders   TSH     Other   Abnormal CBC   Relevant Orders   CBC With Differential/Platelet   Tobacco abuse    Encouraged her to quit and information given today.       Right arm pain  Continue to follow with her orthopedist. Get her MRI next week.       Myopia    Likely the cause of her headaches. Referral to opthalmology made. She is aware her insurance won't pay for glasses, but that she can look for cheaper options once she has a prescription      Relevant Orders   Ambulatory referral to Ophthalmology   Abdominal pain    Possibly cyst. If not better, she will let us know and we will get ultrasound to check on it.       Depression    Patient did not want to discuss this. Would like multivitamin, which was given. Possibly due to her hypothyroid. Await results. Made patient aware that we are here to discuss it if she changes her mind.         Other Visit Diagnoses    Routine general medical examination at a health care facility    -  Primary    Up to date on pap and mammo. Tdap up to date. Refused flu. Will offer pneumovax at next visit. Screening labs checked today.     Relevant Orders    Comprehensive metabolic panel    CBC With Differential/Platelet    Lipid Panel w/o Chol/HDL Ratio    TSH    Immunization due        Tdap  today. Flu refused.     Relevant Orders    Tdap vaccine greater than or equal to 7yo IM (Completed)    Flu Vaccine QUAD 36+ mos PF IM (Fluarix & Fluzone Quad PF)    Routine screening for STI (sexually transmitted infection)        Drawn today    Relevant Orders    HIV antibody    Hepatitis, Acute    RPR    GC/Chlamydia Probe Amp    HSV(herpes simplex vrs) 1+2 ab-IgG    Screening for cholesterol level        Drawn today    Relevant Orders    Lipid Panel w/o Chol/HDL Ratio    Soft tissue mass        Relevant Orders    Comprehensive metabolic panel    CBC With Differential/Platelet    Vaginal itching        Negative wet prep, discussed airflow and avoiding douching.     Relevant Orders    WET PREP FOR TRICH, YEAST, CLUE        Follow up plan: Return in about 3 months (around 03/21/2015) for Records release for pap and mammo from health department.   LABORATORY TESTING:  - Pap smear: done elsewhere  IMMUNIZATIONS:   - Tdap: Tetanus vaccination status reviewed: last tetanus booster within 10 years, Td vaccination indicated and given today.  SCREENING: -Mammogram: Done elsewhere    PATIENT COUNSELING:   Advised to take 1 mg of folate supplement per day if capable of pregnancy.   Sexuality: Discussed sexually transmitted diseases, partner selection, use of condoms, avoidance of unintended pregnancy  and contraceptive alternatives.   Advised to avoid cigarette smoking.  I discussed with the patient that most people either abstain from alcohol or drink within safe limits (<=14/week and <=4 drinks/occasion for males, <=7/weeks and <= 3 drinks/occasion for females) and that the risk for alcohol disorders and other health effects rises proportionally with the number of drinks per week and how often a drinker exceeds daily limits.  Discussed cessation/primary prevention of drug use and availability of treatment for abuse.  Diet: Encouraged to adjust caloric intake to maintain   or achieve ideal body weight, to reduce intake of dietary saturated fat and total fat, to limit sodium intake by avoiding high sodium foods and not adding table salt, and to maintain adequate dietary potassium and calcium preferably from fresh fruits, vegetables, and low-fat dairy products.    stressed the importance of regular exercise  Injury prevention: Discussed safety belts, safety helmets, smoke detector, smoking near bedding or upholstery.   Dental health: Discussed importance of regular tooth brushing, flossing, and dental visits.    NEXT PREVENTATIVE PHYSICAL DUE IN 1 YEAR. Return in about 3 months (around 03/21/2015) for Records release for pap and mammo from health department.

## 2014-12-21 ENCOUNTER — Other Ambulatory Visit: Payer: Self-pay | Admitting: Family Medicine

## 2014-12-21 ENCOUNTER — Telehealth: Payer: Self-pay | Admitting: Family Medicine

## 2014-12-21 ENCOUNTER — Encounter: Payer: Self-pay | Admitting: Family Medicine

## 2014-12-21 LAB — COMPREHENSIVE METABOLIC PANEL
ALT: 12 IU/L (ref 0–32)
AST: 20 IU/L (ref 0–40)
Albumin/Globulin Ratio: 1.4 (ref 1.1–2.5)
Albumin: 4 g/dL (ref 3.5–5.5)
Alkaline Phosphatase: 84 IU/L (ref 39–117)
BUN/Creatinine Ratio: 9 (ref 9–23)
BUN: 7 mg/dL (ref 6–24)
Bilirubin Total: 0.2 mg/dL (ref 0.0–1.2)
CO2: 24 mmol/L (ref 18–29)
CREATININE: 0.74 mg/dL (ref 0.57–1.00)
Calcium: 9.3 mg/dL (ref 8.7–10.2)
Chloride: 102 mmol/L (ref 97–108)
GFR calc Af Amer: 116 mL/min/{1.73_m2} (ref >59)
GFR, EST NON AFRICAN AMERICAN: 100 mL/min/{1.73_m2} (ref >59)
Globulin, Total: 2.8 g/dL (ref 1.5–4.5)
Glucose: 86 mg/dL (ref 65–99)
POTASSIUM: 4.3 mmol/L (ref 3.5–5.2)
Sodium: 141 mmol/L (ref 134–144)
Total Protein: 6.8 g/dL (ref 6.0–8.5)

## 2014-12-21 LAB — HEPATITIS PANEL, ACUTE
HEP B C IGM: NEGATIVE
Hep A IgM: NEGATIVE
Hep C Virus Ab: <0.1 s/co ratio (ref 0.0–0.9)
Hepatitis B Surface Ag: NEGATIVE

## 2014-12-21 LAB — RPR: RPR Ser Ql: NONREACTIVE

## 2014-12-21 LAB — LIPID PANEL W/O CHOL/HDL RATIO
CHOLESTEROL TOTAL: 195 mg/dL (ref 100–199)
HDL: 41 mg/dL (ref >39)
LDL CALC: 120 mg/dL — AB (ref 0–99)
TRIGLYCERIDES: 170 mg/dL — AB (ref 0–149)
VLDL CHOLESTEROL CAL: 34 mg/dL (ref 5–40)

## 2014-12-21 LAB — TSH: TSH: 2.67 u[IU]/mL (ref 0.450–4.500)

## 2014-12-21 LAB — HSV(HERPES SIMPLEX VRS) I + II AB-IGG
HSV 1 Glycoprotein G Ab, IgG: 35.8 index — ABNORMAL HIGH (ref 0.00–0.90)
HSV 2 Glycoprotein G Ab, IgG: 13.7 index — ABNORMAL HIGH (ref 0.00–0.90)

## 2014-12-21 LAB — GC/CHLAMYDIA PROBE AMP
CHLAMYDIA, DNA PROBE: NEGATIVE
NEISSERIA GONORRHOEAE BY PCR: NEGATIVE

## 2014-12-21 LAB — HIV ANTIBODY (ROUTINE TESTING W REFLEX): HIV SCREEN 4TH GENERATION: NONREACTIVE

## 2014-12-21 MED ORDER — LEVOTHYROXINE SODIUM 137 MCG PO TABS: 137.0000 ug | ORAL_TABLET | Freq: Every day | ORAL | Status: DC

## 2014-12-21 NOTE — Telephone Encounter (Signed)
Letter generated to patient. Form up front for her to pick up if she calls back.

## 2014-12-21 NOTE — Telephone Encounter (Signed)
Erroneous

## 2014-12-22 ENCOUNTER — Ambulatory Visit: Admission: RE | Admit: 2014-12-22 | Payer: Medicaid Other | Source: Ambulatory Visit

## 2014-12-24 ENCOUNTER — Telehealth: Payer: Self-pay | Admitting: Family Medicine

## 2014-12-24 NOTE — Telephone Encounter (Signed)
Letter sent to patient today with results. Paperwork is up front for her to pick up. OK to tell her this if she calls back.

## 2014-12-24 NOTE — Telephone Encounter (Signed)
Pt called stated someone called her, she is returning the call. Thanks.

## 2014-12-24 NOTE — Telephone Encounter (Signed)
Forward to provider

## 2014-12-25 LAB — CBC WITH DIFFERENTIAL/PLATELET

## 2014-12-26 ENCOUNTER — Ambulatory Visit: Payer: Medicaid Other

## 2014-12-27 ENCOUNTER — Telehealth: Payer: Self-pay | Admitting: Family Medicine

## 2014-12-27 NOTE — Telephone Encounter (Signed)
Pt called stated she is returning a call. Please call her ASAP. Thanks.

## 2015-01-02 ENCOUNTER — Ambulatory Visit: Payer: Medicaid Other

## 2015-01-03 ENCOUNTER — Ambulatory Visit: Payer: Medicaid Other

## 2015-01-03 ENCOUNTER — Telehealth: Payer: Self-pay | Admitting: Emergency Medicine

## 2015-01-03 NOTE — Telephone Encounter (Signed)
Pt called wants [place] to reschedule her MRI because she missed her appointment. Pt hung up after stating we need to reschedule this for her. Thanks.

## 2015-01-12 ENCOUNTER — Ambulatory Visit: Payer: Medicaid Other

## 2015-03-13 NOTE — Telephone Encounter (Signed)
done

## 2015-03-21 ENCOUNTER — Ambulatory Visit: Payer: Medicaid Other | Admitting: Family Medicine

## 2015-04-09 ENCOUNTER — Ambulatory Visit: Payer: Medicaid Other | Admitting: Family Medicine

## 2015-04-11 ENCOUNTER — Encounter: Payer: Self-pay | Admitting: Family Medicine

## 2015-04-11 ENCOUNTER — Ambulatory Visit (INDEPENDENT_AMBULATORY_CARE_PROVIDER_SITE_OTHER): Payer: Medicaid Other | Admitting: Family Medicine

## 2015-04-11 VITALS — BP 113/78 | HR 83 | Temp 97.8°F | Ht 65.5 in | Wt 211.0 lb

## 2015-04-11 DIAGNOSIS — F329 Major depressive disorder, single episode, unspecified: Secondary | ICD-10-CM | POA: Diagnosis not present

## 2015-04-11 DIAGNOSIS — M62838 Other muscle spasm: Secondary | ICD-10-CM

## 2015-04-11 DIAGNOSIS — F32A Depression, unspecified: Secondary | ICD-10-CM

## 2015-04-11 DIAGNOSIS — Z23 Encounter for immunization: Secondary | ICD-10-CM | POA: Diagnosis not present

## 2015-04-11 DIAGNOSIS — E039 Hypothyroidism, unspecified: Secondary | ICD-10-CM

## 2015-04-11 DIAGNOSIS — M6248 Contracture of muscle, other site: Secondary | ICD-10-CM

## 2015-04-11 NOTE — Progress Notes (Signed)
BP 113/78 mmHg  Pulse 83  Temp(Src) 97.8 F (36.6 C)  Ht 5' 5.5" (1.664 m)  Wt 211 lb (95.709 kg)  BMI 34.57 kg/m2  SpO2 100%  LMP  (LMP Unknown)   Subjective:    Patient ID: Margaret [name], female    DOB: 12/12/1970, 45 y.o.   MRN: [id number]  HPI: Margaret Garibaldionia Lynette Kyllonen is a 45 y.o. female  Chief Complaint  Patient presents with  . Hypothyroidism  . Depression   Doing fine, a little stiff in her neck for the past couple of days. Nothing makes it better, nothing makes it worse. Gives her a headache. She is also trying to get used to her bifocals which are a bit hard for her.   HYPOTHYROIDISM Thyroid control status:controlled Satisfied with current treatment? yes Medication side effects: no Medication compliance: fair compliance Recent dose adjustment:no Fatigue: no Cold intolerance: no Heat intolerance: no Weight gain: no Weight loss: no Constipation: no Diarrhea/loose stools: no Palpitations: no Lower extremity edema: no Anxiety/depressed mood: no  Mood is doing much better, not feeling depressed anymore, feeling more like herself.   Relevant past medical, surgical, family and social history reviewed and updated as indicated. Interim medical history since our last visit reviewed. Allergies and medications reviewed and updated.  Review of Systems  Constitutional: Negative.   Respiratory: Negative.   Cardiovascular: Negative.   Musculoskeletal: Negative.   Psychiatric/Behavioral: Negative.     Per HPI unless specifically indicated above     Objective:    BP 113/78 mmHg  Pulse 83  Temp(Src) 97.8 F (36.6 C)  Ht 5' 5.5" (1.664 m)  Wt 211 lb (95.709 kg)  BMI 34.57 kg/m2  SpO2 100%  LMP  (LMP Unknown)  Wt Readings from Last 3 Encounters:  04/11/15 211 lb (95.709 kg)  12/20/14 207 lb (93.895 kg)  11/21/14 206 lb 3.2 oz (93.532 kg)    Physical Exam  Constitutional: She is oriented to person, place, and time. She appears well-developed and  well-nourished. No distress.  HENT:  Head: Normocephalic and atraumatic.  Right Ear: Hearing and external ear normal.  Left Ear: Hearing and external ear normal.  Nose: Nose normal.  Mouth/Throat: Oropharynx is clear and moist. No oropharyngeal exudate.  Eyes: Conjunctivae, EOM and lids are normal. Pupils are equal, round, and reactive to light. Right eye exhibits no discharge. Left eye exhibits no discharge. No scleral icterus.  Neck: Neck supple. No JVD present. No tracheal deviation present. No thyromegaly present.  Trap spasm on the L  Cardiovascular: Normal rate, regular rhythm, normal heart sounds and intact distal pulses.  Exam reveals no gallop and no friction rub.   No murmur heard. Pulmonary/Chest: Effort normal and breath sounds normal. No stridor. No respiratory distress. She has no wheezes. She has no rales. She exhibits no tenderness.  Musculoskeletal: Normal range of motion.  Lymphadenopathy:    She has no cervical adenopathy.  Neurological: She is alert and oriented to person, place, and time.  Skin: Skin is warm, dry and intact. No rash noted. She is not diaphoretic. No erythema. No pallor.  Psychiatric: She has a normal mood and affect. Her speech is normal and behavior is normal. Judgment and thought content normal. Cognition and memory are normal.  Nursing note and vitals reviewed.   Results for orders placed or performed in visit on 12/20/14  GC/Chlamydia Probe Amp  Result Value Ref Range   Chlamydia trachomatis, NAA Negative Negative   Neisseria gonorrhoeae by PCR Negative Negative  WET PREP FOR TRICH, YEAST, CLUE  Result Value Ref Range   Trichomonas Exam Negative Negative   Yeast Exam Negative Negative   Clue Cell Exam Negative Negative  Comprehensive metabolic panel  Result Value Ref Range   Glucose 86 65 - 99 mg/dL   BUN 7 6 - 24 mg/dL   Creatinine, Ser 1.19 0.57 - 1.00 mg/dL   GFR calc non Af Amer 100 >59 mL/min/1.73   GFR calc Af Amer 116 >59  mL/min/1.73   BUN/Creatinine Ratio 9 9 - 23   Sodium 141 134 - 144 mmol/L   Potassium 4.3 3.5 - 5.2 mmol/L   Chloride 102 97 - 108 mmol/L   CO2 24 18 - 29 mmol/L   Calcium 9.3 8.7 - 10.2 mg/dL   Total Protein 6.8 6.0 - 8.5 g/dL   Albumin 4.0 3.5 - 5.5 g/dL   Globulin, Total 2.8 1.5 - 4.5 g/dL   Albumin/Globulin Ratio 1.4 1.1 - 2.5   Bilirubin Total 0.2 0.0 - 1.2 mg/dL   Alkaline Phosphatase 84 39 - 117 IU/L   AST 20 0 - 40 IU/L   ALT 12 0 - 32 IU/L  HIV antibody  Result Value Ref Range   HIV Screen 4th Generation wRfx Non Reactive Non Reactive  Lipid Panel w/o Chol/HDL Ratio  Result Value Ref Range   Cholesterol, Total 195 100 - 199 mg/dL   Triglycerides 147 (H) 0 - 149 mg/dL   HDL 41 >82 mg/dL   VLDL Cholesterol Cal 34 5 - 40 mg/dL   LDL Calculated 956 (H) 0 - 99 mg/dL  TSH  Result Value Ref Range   TSH 2.670 0.450 - 4.500 uIU/mL  Hepatitis, Acute  Result Value Ref Range   Hep A IgM Negative Negative   Hepatitis B Surface Ag Negative Negative   Hep B C IgM Negative Negative   Hep C Virus Ab <0.1 0.0 - 0.9 s/co ratio  RPR  Result Value Ref Range   RPR Ser Ql Non Reactive Non Reactive  HSV(herpes simplex vrs) 1+2 ab-IgG  Result Value Ref Range   HSV 1 Glycoprotein G Ab, IgG 35.80 (H) 0.00 - 0.90 index   HSV 2 Glycoprotein G Ab, IgG 13.70 (H) 0.00 - 0.90 index  CBC with Differential/Platelet  Result Value Ref Range   WBC CANCELED x10E3/uL   RBC CANCELED    Hemoglobin CANCELED    Hematocrit CANCELED    Platelets CANCELED    Neutrophils CANCELED    Lymphs CANCELED    Monocytes CANCELED    Eos CANCELED    Lymphocytes Absolute CANCELED    EOS (ABSOLUTE) CANCELED    Basophils Absolute CANCELED       Assessment & Plan:   Problem List Items Addressed This Visit      Endocrine   Thyroid activity decreased - Primary    Doing well on current dose. Continue current dose. Continue to monitor.         Other   Depression    Resolved. Continue to monitor.        Relevant Medications   buPROPion (WELLBUTRIN XL) 150 MG 24 hr tablet    Other Visit Diagnoses    Immunization due        Pneumovax given today.    Neck muscle spasm        Heat and stretches given today. Continue to monitor. Call if not getting better or getting worse.         Follow up  plan: Return in about 6 months (around 10/09/2015).

## 2015-04-11 NOTE — Assessment & Plan Note (Signed)
Doing well on current dose. Continue current dose. Continue to monitor.

## 2015-04-11 NOTE — Patient Instructions (Addendum)
HEAT AND COLD  Cold treatment (icing) relieves pain and reduces inflammation. Cold treatment should be applied for 10 to 15 minutes every 2 to 3 hours for inflammation and pain and immediately after any activity that aggravates your symptoms. Use ice packs or massage the area with a piece of ice (ice massage).  Heat treatment may be used prior to performing the stretching and strengthening activities prescribed by your caregiver, physical therapist, or athletic trainer. Use a heat pack or soak your injury in warm water. SEEK MEDICAL CARE IF:  Treatment seems to offer no benefit, or the condition worsens.  Any medications produce adverse side effects. EXERCISES RANGE OF MOTION (ROM) AND STRETCHING EXERCISES - Trapezius Spasm  These exercises may help you when beginning to rehabilitate your injury. Your symptoms may resolve with or without further involvement from your physician, physical therapist or athletic trainer. While completing these exercises, remember:   Restoring tissue flexibility helps normal motion to return to the joints. This allows healthier, less painful movement and activity.  An effective stretch should be held for at least 30 seconds.  A stretch should never be painful. You should only feel a gentle lengthening or release in the stretched tissue. STRETCH - Flexion, Standing  Stand with good posture. With an underhand grip on your right / left and an overhand grip on the opposite hand, grasp a broomstick or cane so that your hands are a little more than shoulder-width apart.  Keeping your right / left elbow straight and shoulder muscles relaxed, push the stick with your opposite hand to raise your right / left arm in front of your body and then overhead. Raise your arm until you feel a stretch in your right / left shoulder, but before you have increased shoulder pain.  Try to avoid shrugging your right / left shoulder as your arm rises by keeping your shoulder blade tucked  down and toward your mid-back spine. Hold __________ seconds.  Slowly return to the starting position. Repeat __________ times. Complete this exercise __________ times per day.  STRETCH - Abduction, Supine  Stand with good posture. With an underhand grip on your right / left and an overhand grip on the opposite hand, grasp a broomstick or cane so that your hands are a little more than shoulder-width apart.  Keeping your right / left elbow straight and shoulder muscles relaxed, push the stick with your opposite hand to raise your right / left arm out to the side of your body and then overhead. Raise your arm until you feel a stretch in your right / left shoulder, but before you have increased shoulder pain.  Try to avoid shrugging your right / left shoulder as your arm rises by keeping your shoulder blade tucked down and toward your mid-back spine. Hold __________ seconds.  Slowly return to the starting position. Repeat __________ times. Complete this exercise __________ times per day.  ROM - Flexion, Active-Assisted  Lie on your back. You may bend your knees for comfort.  Grasp a broomstick or cane so your hands are about shoulder-width apart. Your right / left hand should grip the end of the stick/cane so that your hand is positioned "thumbs-up," as if you were about to shake hands.  Using your healthy arm to lead, raise your right / left arm overhead until you feel a gentle stretch in your shoulder. Hold __________ seconds.  Use the stick/cane to assist in returning your right / left arm to its starting position. Repeat __________  times. Complete this exercise __________ times per day.  STRETCH - External Rotation and Abduction  Stagger your stance through a doorframe. It does not matter which foot is forward.  Choose one of the following positions as instructed by your physician, physical therapist or athletic trainer: place your hands:  and forearms above your head and on the door  frame.  and forearms at head-height and on the door frame.  at elbow-height and on the door frame.  Keeping your head and chest upright and your stomach muscles tight to prevent over-extending your low-back, slowly shift your weight onto your front foot until you feel a stretch across your chest and/or in the front of your shoulders.  Hold __________ seconds. Shift your weight to your back foot to release the stretch. Repeat __________ times. Complete this stretch __________ times per day.  STRENGTHENING EXERCISES - Trapezius Palsy  These exercises may help you when beginning to rehabilitate your injury. They may resolve your symptoms with or without further involvement from your physician, physical therapist or athletic trainer. While completing these exercises, remember:   Muscles can gain both the endurance and the strength needed for everyday activities through controlled exercises.  Complete these exercises as instructed by your physician, physical therapist or athletic trainer. Progress with the resistance and repetition exercises only as your caregiver advises.  You may experience muscle soreness or fatigue, but the pain or discomfort you are trying to eliminate should never worsen during these exercises. If this pain does worsen, stop and make certain you are following the directions exactly. If the pain is still present after adjustments, discontinue the exercise until you can discuss the trouble with your clinician. STRENGTH - Scapular Depression and Adduction  With good posture, sit on a firm chair. Support your arms in front of you with pillows, arm rests or a table top. Have your elbows in line with the sides of your body.  Gently draw your shoulder blades down and toward your mid-back spine. Gradually increase the tension without tensing the muscles along the top of your shoulders and the back of your neck.  Hold for __________ seconds. Slowly release the tension and relax your  muscles completely before completing the next repetition.  After you have practiced this exercise, remove the arm support and complete it while standing as well as sitting. Repeat __________ times. Complete this exercise __________ times per day.  STRENGTH - Shoulder Abductors, Isometric   With good posture, stand or sit about 4-6 inches from a wall with your right / left side facing the wall.  Bend your right / left elbow. Gently press your right / left elbow into the wall. Increase the pressure gradually until you are pressing as hard as you can without shrugging your shoulder or increasing any shoulder discomfort.  Hold __________ seconds.  Release the tension slowly. Relax your shoulder muscles completely before you do the next repetition. Repeat __________ times. Complete this exercise __________ times per day.  STRENGTH - Shoulder Flexion, Isometric  With good posture and facing a wall, stand or sit about 4-6 inches away.  Keeping your right / left elbow straight, gently press the top of your fist into the wall. Increase the pressure gradually until you are pressing as hard as you can without shrugging your shoulder or increasing any shoulder discomfort.  Hold __________ seconds.  Release the tension slowly. Relax your shoulder muscles completely before you do the next repetition. Repeat __________ times. Complete this exercise __________ times per  day.  STRENGTH - Internal Rotators  Secure a rubber exercise band/tubing to a fixed object so that it is at the same height as your right / left elbow when you are standing or sitting on a firm surface.  Stand or sit so that the secured exercise band/tubing is at your right / left side.  Bend your elbow 90 degrees. Place a folded towel or small pillow under your right / left arm so that your elbow is a few inches away from your side.  Keeping the tension on the exercise band/tubing, pull it across your body toward your abdomen. Be  sure to keep your body steady so that the movement is only coming from your shoulder rotating.  Hold __________ seconds. Release the tension in a controlled manner as you return to the starting position. Repeat __________ times. Complete this exercise __________ times per day.  STRENGTH - External Rotators  Secure a rubber exercise band/tubing to a fixed object so that it is at the same height as your right / left elbow when you are standing or sitting on a firm surface.  Stand or sit so that the secured exercise band/tubing is at your side that is not injured.  Bend your elbow 90 degrees. Place a folded towel or small pillow under your right / left arm so that your elbow is a few inches away from your side.  Keeping the tension on the exercise band/tubing, pull it away from your body, as if pivoting on your elbow. Be sure to keep your body steady so that the movement is only coming from your shoulder rotating.  Hold __________ seconds. Release the tension in a controlled manner as you return to the starting position. Repeat __________ times. Complete this exercise __________ times per day.  STRENGTH - Shoulder Extensors  Secure a rubber exercise band/tubing so that it is at the height of your shoulders when you are either standing or sitting on a firm arm-less chair.  With a thumbs-up grip, grasp an end of the band/tubing in each hand. Straighten your elbows and lift your hands straight in front of you at shoulder height. Step back away from the secured end of band/tubing until it becomes tense.  Squeezing your shoulder blades together, pull your hands down to the sides of your thighs. Do not allow your hands to go behind you.  Hold for __________ seconds. Slowly ease the tension on the band/tubing as you reverse the directions and return to the starting position. Repeat __________ times. Complete this exercise __________ times per day.  STRENGTH - Shoulder Extensors, Prone  Lie on your  stomach on a firm surface so that your right / left arm overhangs the edge. Rest your forehead on your opposite forearm. With your thumb facing away from your body and your elbow straight, hold a __________ weight in your hand.  Squeeze your right / left shoulder blade to your mid-back spine and then slowly raise your arm behind you to the height of the bed.  Hold for __________ seconds. Slowly reverse the directions and return to the starting position, controlling the weight as you lower your arm. Repeat __________ times. Complete this exercise __________ times per day.  STRENGTH - Horizontal Abductors Choose one of the two oppositions to complete this exercise. Prone (lying on stomach):  Lie on your stomach on a firm surface so that your right / left arm overhangs the edge. Rest your forehead on your opposite forearm. With your palm facing the floor  and your elbow straight, hold a __________ weight in your hand.  Squeeze your right / left shoulder blade to your mid-back spine and then slowly raise your arm to the height of the bed.  Hold for __________ seconds. Slowly reverse the directions and return to the starting position, controlling the weight as you lower your arm. Repeat __________ times. Complete this exercise __________ times per day. Standing:   Secure a rubber exercise band/tubing so that it is at the height of your shoulders when you are either standing or sitting on a firm arm-less chair.  Grasp an end of the band/tubing in each hand and have your palms face each other. Straighten your elbows and lift your hands straight in front of you at shoulder height. Step back away from the secured end of band/tubing until it becomes tense.  Squeeze your shoulder blades together. Keeping your elbows locked and your hands at shoulder-height, bring your hands out to your side.  Hold __________ seconds. Slowly ease the tension on the band/tubing as you reverse the directions and return to  the starting position. Repeat __________ times. Complete this exercise __________ times per day. STRENGTH - Scapular Retractors and Elevators  Secure a rubber exercise band/tubing so that it is at the height of your shoulders when you are either standing or sitting on a firm arm-less chair.  With a thumbs-up grip, grasp an end of the band/tubing in each hand. Step back away from the secured end of band/tubing until it becomes tense.  Squeezing your shoulder blades together, straighten your elbows and lift your hands straight over your head.  Hold for __________ seconds. Slowly ease the tension on the band/tubing as you reverse the directions and return to the starting position. Repeat __________ times. Complete this exercise __________ times per day.    This information is not intended to replace advice given to you by your health care provider. Make sure you discuss any questions you have with your health care provider.   Document Released: 03/23/2005 Document Revised: 08/07/2014 Document Reviewed: 07/05/2008 [employer] Interactive Patient Education [employer]

## 2015-04-11 NOTE — Assessment & Plan Note (Signed)
Resolved.  Continue to monitor.

## 2015-04-18 ENCOUNTER — Ambulatory Visit: Payer: Medicaid Other

## 2015-05-16 ENCOUNTER — Ambulatory Visit: Payer: Medicaid Other | Admitting: Family Medicine

## 2015-05-16 ENCOUNTER — Ambulatory Visit: Payer: Medicaid Other

## 2015-05-28 ENCOUNTER — Ambulatory Visit: Payer: Medicaid Other | Admitting: Family Medicine

## 2015-07-05 ENCOUNTER — Ambulatory Visit: Payer: Medicaid Other | Admitting: Family Medicine

## 2015-07-08 ENCOUNTER — Encounter: Payer: Self-pay | Admitting: Family Medicine

## 2015-07-08 ENCOUNTER — Ambulatory Visit (INDEPENDENT_AMBULATORY_CARE_PROVIDER_SITE_OTHER): Payer: Medicaid Other | Admitting: Family Medicine

## 2015-07-08 VITALS — BP 118/87 | HR 66 | Temp 98.0°F | Ht 66.0 in | Wt 214.0 lb

## 2015-07-08 DIAGNOSIS — N760 Acute vaginitis: Secondary | ICD-10-CM

## 2015-07-08 DIAGNOSIS — A499 Bacterial infection, unspecified: Secondary | ICD-10-CM | POA: Diagnosis not present

## 2015-07-08 DIAGNOSIS — N912 Amenorrhea, unspecified: Secondary | ICD-10-CM

## 2015-07-08 DIAGNOSIS — L298 Other pruritus: Secondary | ICD-10-CM

## 2015-07-08 DIAGNOSIS — M62838 Other muscle spasm: Secondary | ICD-10-CM

## 2015-07-08 DIAGNOSIS — M6248 Contracture of muscle, other site: Secondary | ICD-10-CM

## 2015-07-08 DIAGNOSIS — Z113 Encounter for screening for infections with a predominantly sexual mode of transmission: Secondary | ICD-10-CM | POA: Diagnosis not present

## 2015-07-08 DIAGNOSIS — R350 Frequency of micturition: Secondary | ICD-10-CM | POA: Diagnosis not present

## 2015-07-08 DIAGNOSIS — N898 Other specified noninflammatory disorders of vagina: Secondary | ICD-10-CM

## 2015-07-08 DIAGNOSIS — B9689 Other specified bacterial agents as the cause of diseases classified elsewhere: Secondary | ICD-10-CM

## 2015-07-08 LAB — UA/M W/RFLX CULTURE, ROUTINE
Bilirubin, UA: NEGATIVE
Glucose, UA: NEGATIVE
Ketones, UA: NEGATIVE
LEUKOCYTES UA: NEGATIVE
Nitrite, UA: NEGATIVE
PH UA: 5.5 (ref 5.0–7.5)
PROTEIN UA: NEGATIVE
RBC, UA: NEGATIVE
Specific Gravity, UA: 1.01 (ref 1.005–1.030)
Urobilinogen, Ur: 0.2 mg/dL (ref 0.2–1.0)

## 2015-07-08 LAB — WET PREP FOR TRICH, YEAST, CLUE
Clue Cell Exam: POSITIVE — AB
Trichomonas Exam: NEGATIVE
Yeast Exam: NEGATIVE

## 2015-07-08 LAB — PREGNANCY, URINE: Preg Test, Ur: NEGATIVE

## 2015-07-08 MED ORDER — CYCLOBENZAPRINE HCL 10 MG PO TABS: 10.0000 mg | ORAL_TABLET | Freq: Every day | ORAL | Status: DC

## 2015-07-08 MED ORDER — METRONIDAZOLE 500 MG PO TABS: 500.0000 mg | ORAL_TABLET | Freq: Two times a day (BID) | ORAL | Status: DC

## 2015-07-08 NOTE — Progress Notes (Signed)
BP 118/87 mmHg  Pulse 66  Temp(Src) 98 F (36.7 C)  Ht 5\' 6"  (1.676 m)  Wt 214 lb (97.07 kg)  BMI 34.56 kg/m2  SpO2 100%  LMP  (LMP Unknown)   Subjective:    Patient ID: Margaret [name], female    DOB: 07/20/1970, 45 y.o.   MRN: [id number]  HPI: Margaret [name] is a 45 y.o. female  Chief Complaint  Patient presents with  . Neck Pain    No known injury  . Amenorrhea    Patient states that she has not had a period in 3-304months   NECK PAIN  Duration:  Couple of days Status: uncontrolled Treatments attempted: none  Relief with NSAIDs?:  No NSAIDs Taken Location:midline and on the upper L Duration: couple of days Severity: severe Quality: sharp, dull and shooting Frequency: constant Radiation: headache Aggravating factors: movement and laying Alleviating factors: nothing Weakness:  no Paresthesias / decreased sensation: yes, in her hands   Fevers:  no  VAGINAL Itching Duration: couple of days Discharge description: no discharge  Pruritus: yes Dysuria: no Malodorous: no Urinary frequency: yes Fevers: no Abdominal pain: yes, unable to know where it   Sexual activity: concerned about STIs History of sexually transmitted diseases: yes Recent antibiotic use: no Treatments attempted: none  Relevant past medical, surgical, family and social history reviewed and updated as indicated. Interim medical history since our last visit reviewed. Allergies and medications reviewed and updated.  Review of Systems  Constitutional: Negative.   Cardiovascular: Negative.   Genitourinary: Positive for dysuria, frequency, vaginal discharge, menstrual problem and pelvic pain. Negative for urgency, hematuria, flank pain, decreased urine volume, vaginal bleeding, enuresis, difficulty urinating, genital sores, vaginal pain and dyspareunia.  Musculoskeletal: Positive for myalgias, neck pain and neck stiffness. Negative for back pain, joint swelling, arthralgias and gait problem.   Skin: Negative.   Neurological: Positive for headaches. Negative for dizziness, tremors, seizures, syncope, facial asymmetry, speech difficulty, weakness and light-headedness.  Psychiatric/Behavioral: Negative.     Per HPI unless specifically indicated above     Objective:    BP 118/87 mmHg  Pulse 66  Temp(Src) 98 F (36.7 C)  Ht 5\' 6"  (1.676 m)  Wt 214 lb (97.07 kg)  BMI 34.56 kg/m2  SpO2 100%  LMP  (LMP Unknown)  Wt Readings from Last 3 Encounters:  07/08/15 214 lb (97.07 kg)  04/11/15 211 lb (95.709 kg)  12/20/14 207 lb (93.895 kg)    Physical Exam  Constitutional: She is oriented to person, place, and time. She appears well-developed and well-nourished. No distress.  HENT:  Head: Normocephalic and atraumatic.  Right Ear: Hearing and external ear normal.  Left Ear: Hearing and external ear normal.  Nose: Nose normal.  Eyes: Conjunctivae, EOM and lids are normal. Pupils are equal, round, and reactive to light. Right eye exhibits no discharge. Left eye exhibits no discharge. No scleral icterus.  Neck: Neck supple. No JVD present. No tracheal deviation present. No thyromegaly present.  Pulmonary/Chest: Effort normal. No stridor. No respiratory distress.  Genitourinary: Guaiac negative stool. No labial fusion. There is no rash, tenderness, lesion or injury on the right labia. There is no rash, tenderness, lesion or injury on the left labia. No erythema, tenderness or bleeding in the vagina. No foreign body around the vagina. No signs of injury around the vagina. Vaginal discharge found.  Musculoskeletal: She exhibits tenderness. She exhibits no edema.  Lymphadenopathy:    She has no cervical adenopathy.  Neurological: She is  alert and oriented to person, place, and time.  Skin: Skin is warm, dry and intact. No rash noted. She is not diaphoretic. No erythema. No pallor.  Psychiatric: She has a normal mood and affect. Her speech is normal and behavior is normal. Judgment and  thought content normal. Cognition and memory are normal.  Nursing note and vitals reviewed.  Neck Exam:    Tenderness to Palpation: yes    Midline cervical spine: no    Paraspinal neck musculature: yes    Trapezius: yes    Sternocleidomastoid: no     Range of Motion:     Flexion: Decreased    Extension: Decreased    Lateral rotation: Decreased    Lateral bending: Decreased     Neuro Examination: Upper extremity DTRs normal & symmetric.  Strength and sensation intact.    Special Tests:     Spurling test: negative  Results for orders placed or performed in visit on 07/08/15  WET PREP FOR TRICH, YEAST, CLUE  Result Value Ref Range   Trichomonas Exam Negative Negative   Yeast Exam Negative Negative   Clue Cell Exam Positive (A) Negative  Pregnancy, urine  Result Value Ref Range   Preg Test, Ur Negative Negative  UA/M w/rflx Culture, Routine  Result Value Ref Range   Specific Gravity, UA 1.010 1.005 - 1.030   pH, UA 5.5 5.0 - 7.5   Color, UA Yellow Yellow   Appearance Ur Clear Clear   Leukocytes, UA Negative Negative   Protein, UA Negative Negative/Trace   Glucose, UA Negative Negative   Ketones, UA Negative Negative   RBC, UA Negative Negative   Bilirubin, UA Negative Negative   Urobilinogen, Ur 0.2 0.2 - 1.0 mg/dL   Nitrite, UA Negative Negative      Assessment & Plan:   Problem List Items Addressed This Visit    None    Visit Diagnoses    Neck muscle spasm    -  Primary    Will treat with flexeril. Exercises given. Recheck 2 weeks. Call with any concerns. Heat and NSAIDs as needed.     BV (bacterial vaginosis)        Will treat with metronidazole. Call if not getting better or getting worse.     Relevant Medications    metroNIDAZOLE (FLAGYL) 500 MG tablet    Routine screening for STI (sexually transmitted infection)        Labs drawn today, await results. Call with concerns.     Relevant Orders    WET PREP FOR TRICH, YEAST, CLUE    HIV antibody     Hepatitis, Acute    RPR    GC/Chlamydia Probe Amp    HSV(herpes simplex vrs) 1+2 ab-IgG    Vaginal itching        Relevant Orders    WET PREP FOR TRICH, YEAST, CLUE    Amenorrhea        Will return for full discussion when she is feeling better.    Relevant Orders    Pregnancy, urine (Completed)    Urinary frequency        UA negative today. Likely due to BV.     Relevant Orders    UA/M w/rflx Culture, Routine        Follow up plan: Return in about 2 weeks (around 07/22/2015) for recheck neck and discuss abnormal periods.

## 2015-07-09 LAB — HEPATITIS PANEL, ACUTE
HEP A IGM: NEGATIVE
Hep B C IgM: NEGATIVE
Hep C Virus Ab: <0.1 s/co ratio (ref 0.0–0.9)
Hepatitis B Surface Ag: NEGATIVE

## 2015-07-09 LAB — HSV(HERPES SIMPLEX VRS) I + II AB-IGG
HSV 1 Glycoprotein G Ab, IgG: 31.4 index — ABNORMAL HIGH (ref 0.00–0.90)
HSV 2 Glycoprotein G Ab, IgG: 14.5 index — ABNORMAL HIGH (ref 0.00–0.90)

## 2015-07-09 LAB — HIV ANTIBODY (ROUTINE TESTING W REFLEX): HIV Screen 4th Generation wRfx: NONREACTIVE

## 2015-07-09 LAB — RPR: RPR Ser Ql: NONREACTIVE

## 2015-07-10 ENCOUNTER — Telehealth: Payer: Self-pay | Admitting: Family Medicine

## 2015-07-10 LAB — GC/CHLAMYDIA PROBE AMP
Chlamydia trachomatis, NAA: NEGATIVE
Neisseria gonorrhoeae by PCR: NEGATIVE

## 2015-07-10 NOTE — Telephone Encounter (Signed)
Spoke with patient.

## 2015-07-10 NOTE — Telephone Encounter (Signed)
Please let her know that all her lab work came back nice and normal, exactly the same as last time she was screened. Thanks!

## 2015-07-10 NOTE — Telephone Encounter (Signed)
Called patient, no answer, left VM to call back.

## 2015-07-22 ENCOUNTER — Ambulatory Visit: Payer: Medicaid Other | Admitting: Family Medicine

## 2015-07-23 ENCOUNTER — Encounter: Payer: Self-pay | Admitting: Family Medicine

## 2015-09-12 ENCOUNTER — Ambulatory Visit: Payer: Medicaid Other | Admitting: Family Medicine

## 2015-10-01 ENCOUNTER — Ambulatory Visit: Payer: Medicaid Other | Admitting: Family Medicine

## 2015-10-07 ENCOUNTER — Ambulatory Visit: Payer: Medicaid Other | Admitting: Family Medicine

## 2015-12-10 ENCOUNTER — Other Ambulatory Visit: Payer: Self-pay | Admitting: Family Medicine

## 2015-12-10 ENCOUNTER — Other Ambulatory Visit: Payer: Medicaid Other

## 2015-12-10 DIAGNOSIS — Z111 Encounter for screening for respiratory tuberculosis: Secondary | ICD-10-CM

## 2015-12-13 ENCOUNTER — Encounter: Payer: Self-pay | Admitting: Family Medicine

## 2015-12-13 LAB — QUANTIFERON IN TUBE
QFT TB AG MINUS NIL VALUE: <0 IU/mL
QUANTIFERON MITOGEN VALUE: >10 IU/mL
QUANTIFERON NIL VALUE: 0.05 [IU]/mL
QUANTIFERON TB AG VALUE: 0.03 [IU]/mL
QUANTIFERON TB GOLD: NEGATIVE

## 2015-12-13 LAB — QUANTIFERON TB GOLD ASSAY (BLOOD)

## 2015-12-17 ENCOUNTER — Ambulatory Visit: Payer: Medicaid Other | Admitting: Family Medicine

## 2016-02-11 ENCOUNTER — Ambulatory Visit: Payer: Medicaid Other | Admitting: Family Medicine

## 2016-02-14 ENCOUNTER — Ambulatory Visit: Payer: Medicaid Other | Admitting: Family Medicine

## 2016-03-04 ENCOUNTER — Ambulatory Visit: Payer: Medicaid Other | Admitting: Family Medicine

## 2016-03-06 ENCOUNTER — Ambulatory Visit: Payer: Medicaid Other | Admitting: Family Medicine

## 2016-03-09 ENCOUNTER — Ambulatory Visit: Payer: Medicaid Other | Admitting: Family Medicine

## 2016-03-09 ENCOUNTER — Other Ambulatory Visit: Payer: Self-pay | Admitting: Family Medicine

## 2016-03-09 DIAGNOSIS — Z30013 Encounter for initial prescription of injectable contraceptive: Secondary | ICD-10-CM

## 2016-03-12 ENCOUNTER — Encounter: Payer: Self-pay | Admitting: Family Medicine

## 2016-03-12 ENCOUNTER — Ambulatory Visit: Payer: Medicaid Other | Admitting: Family Medicine

## 2016-03-12 ENCOUNTER — Ambulatory Visit (INDEPENDENT_AMBULATORY_CARE_PROVIDER_SITE_OTHER): Payer: Medicaid Other | Admitting: Family Medicine

## 2016-03-12 VITALS — BP 113/79 | HR 89 | Temp 97.3°F | Wt 218.3 lb

## 2016-03-12 DIAGNOSIS — J069 Acute upper respiratory infection, unspecified: Secondary | ICD-10-CM

## 2016-03-12 DIAGNOSIS — M79601 Pain in right arm: Secondary | ICD-10-CM | POA: Diagnosis not present

## 2016-03-12 DIAGNOSIS — B9789 Other viral agents as the cause of diseases classified elsewhere: Secondary | ICD-10-CM | POA: Diagnosis not present

## 2016-03-12 DIAGNOSIS — N926 Irregular menstruation, unspecified: Secondary | ICD-10-CM

## 2016-03-12 MED ORDER — PREDNISONE 50 MG PO TABS: 50.0000 mg | ORAL_TABLET | Freq: Every day | ORAL | 0 refills | Status: DC

## 2016-03-12 NOTE — Assessment & Plan Note (Signed)
Has not followed up with orthopedics in over a year. Never had her MRI. New referral put in today.

## 2016-03-12 NOTE — Progress Notes (Signed)
BP 113/79 (BP Location: Left Arm, Patient Position: Sitting, Cuff Size: Large)   Pulse 89   Temp 97.3 F (36.3 C)   Wt 218 lb 4.8 oz (99 kg)   SpO2 97%   BMI 35.23 kg/m    Subjective:    Patient ID: Margaret [name], female    DOB: 11/05/1970, 45 y.o.   MRN: [id number]  HPI: Margaret [name] is a 45 y.o. female  Chief Complaint  Patient presents with  . Sore Throat  . Amenorrhea  . Mass   ARM PAIN Duration: lump has been there for over a year, but pain started within the past couple of days and radiates up her arm into her neck and head- was supposed to see the ortho, but never got in with her.  Location: R forearm elbow Mechanism of injury: unknown Onset: sudden Severity: moderate  Quality:  sharp, shooting and stabbing Frequency: constant Radiation: yes into her neck and head Aggravating factors: lifting and carrying and movement  Alleviating factors: nothing and rest  Status: worse Treatments attempted: rest, ice, heat, APAP and ibuprofen  Relief with NSAIDs?:  no Swelling: yes Redness: no  Warmth: no Trauma: no Chest pain: no  Shortness of breath: no  Fever: no Decreased sensation: no Paresthesias: no Weakness: yes  ABNORMAL MENSTRUAL PERIODS Duration: hasn't had a period in >6 months hasn't had a period in that time, hasn't had a regular period since 2015 Average interval between menses: 28-31 days Length of menses: 5-7 days Flow: normal Dysmenorrhea: no Intermenstrual bleeding:no Postcoital bleeding: no Contraception: none Menarche at age: [age] History of sexually transmitted diseases: no History GYN procedures: no Abnormal pap smears: yes- many years ago   Dyspareunia: no Vaginal discharge:no Abdominal pain: no Galactorrhea: no Hirsuitism: no Frequent bruising/mucosal bleeding: no Double vision:yes Hot flashes: yes  UPPER RESPIRATORY TRACT INFECTION Duration: 2 days Worst symptom: sore throat Fever: no Cough: yes Shortness of  breath: yes Wheezing: no Chest pain: yes, with cough Chest tightness: yes Chest congestion: yes Nasal congestion: yes Runny nose: no Post nasal drip: yes Sneezing: yes Sore throat: yes Swollen glands: no Sinus pressure: no Headache: yes Face pain: no Toothache: yes Ear pain: yes "right Ear pressure: no "right Eyes red/itching:no Eye drainage/crusting: no  Vomiting: no Rash: no Fatigue: yes Sick contacts: yes Strep contacts: yes  Context: worse Recurrent sinusitis: no Relief with OTC cold/cough medications: no  Treatments attempted: none    Relevant past medical, surgical, family and social history reviewed and updated as indicated. Interim medical history since our last visit reviewed. Allergies and medications reviewed and updated.  Review of Systems  Constitutional: Positive for fatigue. Negative for activity change, appetite change, chills, diaphoresis, fever and unexpected weight change.  HENT: Positive for congestion, dental problem, ear pain, postnasal drip, rhinorrhea, sinus pain, sinus pressure and sore throat. Negative for drooling, ear discharge, facial swelling, hearing loss, mouth sores, nosebleeds, sneezing, tinnitus, trouble swallowing and voice change.   Eyes: Negative.   Respiratory: Positive for cough, chest tightness, shortness of breath and wheezing. Negative for apnea, choking and stridor.   Cardiovascular: Negative.   Psychiatric/Behavioral: Negative.     Per HPI unless specifically indicated above     Objective:    BP 113/79 (BP Location: Left Arm, Patient Position: Sitting, Cuff Size: Large)   Pulse 89   Temp 97.3 F (36.3 C)   Wt 218 lb 4.8 oz (99 kg)   SpO2 97%   BMI 35.23 kg/m  Wt Readings from Last 3 Encounters:  03/12/16 218 lb 4.8 oz (99 kg)  07/08/15 214 lb (97.1 kg)  04/11/15 211 lb (95.7 kg)    Physical Exam  Constitutional: She is oriented to person, place, and time. She appears well-developed and well-nourished. No  distress.  HENT:  Head: Normocephalic and atraumatic.  Right Ear: Hearing and external ear normal.  Left Ear: Hearing and external ear normal.  Nose: Nose normal.  Mouth/Throat: Oropharynx is clear and moist. No oropharyngeal exudate.  Eyes: Conjunctivae, EOM and lids are normal. Pupils are equal, round, and reactive to light. Right eye exhibits no discharge. Left eye exhibits no discharge. No scleral icterus.  Neck: Normal range of motion. Neck supple. No JVD present. No tracheal deviation present. No thyromegaly present.  Cardiovascular: Normal rate, regular rhythm, normal heart sounds and intact distal pulses.  Exam reveals no gallop and no friction rub.   No murmur heard. Pulmonary/Chest: Effort normal. No respiratory distress. She has wheezes. She has no rales. She exhibits no tenderness.  Musculoskeletal: Normal range of motion.  Strength 4/5 throughout R arm 3.5cm soft, mobile lump 2 in distal to the olecranon on the posterior side of the forearm   Lymphadenopathy:    She has no cervical adenopathy.  Neurological: She is alert and oriented to person, place, and time.  Skin: Skin is warm, dry and intact. No rash noted. She is not diaphoretic. No erythema. No pallor.  Psychiatric: She has a normal mood and affect. Her speech is normal and behavior is normal. Judgment and thought content normal. Cognition and memory are normal.  Nursing note and vitals reviewed.   Results for orders placed or performed in visit on 12/10/15  Quantiferon tb gold assay (blood)  Result Value Ref Range   QUANTIFERON INCUBATION Comment   QuantiFERON In Tube  Result Value Ref Range   QUANTIFERON TB GOLD Negative Negative   QUANTIFERON CRITERIA Comment    QUANTIFERON TB AG VALUE 0.03 IU/mL   Quantiferon Nil Value 0.05 IU/mL   QUANTIFERON MITOGEN VALUE >10.00 IU/mL   QFT TB AG MINUS NIL VALUE <0.00 IU/mL   Interpretation: Comment       Assessment & Plan:   Problem List Items Addressed This Visit        Other   Right arm pain    Has not followed up with orthopedics in over a year. Never had her MRI. New referral put in today.      Relevant Orders   Ambulatory referral to Orthopedic Surgery    Other Visit Diagnoses    Abnormal menses    -  Primary   ?menopausal. Will check labs. Await results. No menses in >6 months and abnormal periods for 2+ years.    Relevant Orders   Comprehensive metabolic panel   CBC with Differential/Platelet   TSH   LH   FSH   Prolactin   Estradiol   Testosterone, free, total   Viral upper respiratory tract infection       Will treat with prednisone given slight wheezing. Call if not getting better or getting worse.        Follow up plan: Return ASAP for physical.

## 2016-03-13 ENCOUNTER — Encounter: Payer: Self-pay | Admitting: Family Medicine

## 2016-03-13 ENCOUNTER — Telehealth: Payer: Self-pay | Admitting: Family Medicine

## 2016-03-13 NOTE — Telephone Encounter (Signed)
Called patient, no answer, left a message for patient to return my call.  

## 2016-03-13 NOTE — Telephone Encounter (Signed)
Patient returned call and was notified of results

## 2016-03-13 NOTE — Telephone Encounter (Signed)
Please let Margaret [name] know that she doesn't need depo, because she has gone through the change. Also, her kidney function is up a little bit, so she needs to drink more water and her thyroid is a little under active, so she needs to take her pills every day. Thanks.

## 2016-03-15 LAB — CBC WITH DIFFERENTIAL/PLATELET
BASOS: 1 %
Basophils Absolute: 0 10*3/uL (ref 0.0–0.2)
EOS (ABSOLUTE): 0.2 10*3/uL (ref 0.0–0.4)
EOS: 3 %
HEMATOCRIT: 36.6 % (ref 34.0–46.6)
HEMOGLOBIN: 11.8 g/dL (ref 11.1–15.9)
IMMATURE GRANS (ABS): 0 10*3/uL (ref 0.0–0.1)
IMMATURE GRANULOCYTES: 0 %
LYMPHS: 31 %
Lymphocytes Absolute: 2.8 10*3/uL (ref 0.7–3.1)
MCH: 27.1 pg (ref 26.6–33.0)
MCHC: 32.2 g/dL (ref 31.5–35.7)
MCV: 84 fL (ref 79–97)
MONOCYTES: 9 %
Monocytes Absolute: 0.8 10*3/uL (ref 0.1–0.9)
NEUTROS PCT: 56 %
Neutrophils Absolute: 5 10*3/uL (ref 1.4–7.0)
PLATELETS: 326 10*3/uL (ref 150–379)
RBC: 4.36 x10E6/uL (ref 3.77–5.28)
RDW: 14.4 % (ref 12.3–15.4)
WBC: 8.8 10*3/uL (ref 3.4–10.8)

## 2016-03-15 LAB — COMPREHENSIVE METABOLIC PANEL
ALT: 14 IU/L (ref 0–32)
AST: 18 IU/L (ref 0–40)
Albumin/Globulin Ratio: 1.4 (ref 1.2–2.2)
Albumin: 3.9 g/dL (ref 3.5–5.5)
Alkaline Phosphatase: 93 IU/L (ref 39–117)
BUN/Creatinine Ratio: 4 — ABNORMAL LOW (ref 9–23)
BUN: 5 mg/dL — ABNORMAL LOW (ref 6–24)
Bilirubin Total: 0.3 mg/dL (ref 0.0–1.2)
CALCIUM: 8.8 mg/dL (ref 8.7–10.2)
CO2: 26 mmol/L (ref 18–29)
CREATININE: 1.12 mg/dL — AB (ref 0.57–1.00)
Chloride: 103 mmol/L (ref 96–106)
GFR, EST AFRICAN AMERICAN: 69 mL/min/{1.73_m2} (ref >59)
GFR, EST NON AFRICAN AMERICAN: 60 mL/min/{1.73_m2} (ref >59)
Globulin, Total: 2.8 g/dL (ref 1.5–4.5)
Glucose: 78 mg/dL (ref 65–99)
Potassium: 3.5 mmol/L (ref 3.5–5.2)
Sodium: 143 mmol/L (ref 134–144)
TOTAL PROTEIN: 6.7 g/dL (ref 6.0–8.5)

## 2016-03-15 LAB — TESTOSTERONE, FREE, TOTAL, SHBG
SEX HORMONE BINDING: 25.8 nmol/L (ref 24.6–122.0)
TESTOSTERONE: 9 ng/dL (ref 8–48)
Testosterone, Free: 0.4 pg/mL (ref 0.0–4.2)

## 2016-03-15 LAB — ESTRADIOL: ESTRADIOL: 7 pg/mL

## 2016-03-15 LAB — LUTEINIZING HORMONE: LH: 41.1 m[IU]/mL

## 2016-03-15 LAB — FOLLICLE STIMULATING HORMONE: FSH: 53.8 m[IU]/mL

## 2016-03-15 LAB — PROLACTIN: PROLACTIN: 8.5 ng/mL (ref 4.8–23.3)

## 2016-03-15 LAB — TSH: TSH: 4.09 u[IU]/mL (ref 0.450–4.500)

## 2016-03-16 ENCOUNTER — Telehealth: Payer: Self-pay | Admitting: Family Medicine

## 2016-03-16 NOTE — Telephone Encounter (Signed)
Spoke with patient, she does not need it any longer.

## 2016-03-16 NOTE — Telephone Encounter (Signed)
Patient would like to get a copy of her TB test results if possible.  Please advise.  Thank you   [phone]

## 2016-03-19 ENCOUNTER — Encounter: Payer: Self-pay | Admitting: Family Medicine

## 2016-03-19 ENCOUNTER — Ambulatory Visit (INDEPENDENT_AMBULATORY_CARE_PROVIDER_SITE_OTHER): Payer: Medicaid Other | Admitting: Family Medicine

## 2016-03-19 VITALS — BP 137/86 | HR 78 | Temp 97.7°F | Wt 219.1 lb

## 2016-03-19 DIAGNOSIS — B351 Tinea unguium: Secondary | ICD-10-CM | POA: Diagnosis not present

## 2016-03-19 NOTE — Progress Notes (Signed)
BP 137/86 (BP Location: Left Arm, Patient Position: Sitting, Cuff Size: Large)   Pulse 78   Temp 97.7 F (36.5 C)   Wt 219 lb 1.6 oz (99.4 kg)   SpO2 98%   BMI 35.36 kg/m    Subjective:    Patient ID: Margaret [name], female    DOB: 02/02/1971, 45 y.o.   MRN: [id number]  HPI: Margaret Garibaldionia Lynette Hayton is a 45 y.o. female  Chief Complaint  Patient presents with  . Toe Pain   TOE PAIN Duration: About a year Involved toe: rightbig toe  Mechanism of injury: unknown Onset: gradual Severity: moderate  Quality: aching and sore Frequency: constant Radiation: no Aggravating factors: walking  Alleviating factors: nothing  Status: worse Treatments attempted: nothing  Relief with NSAIDs?: No NSAIDs Taken Morning stiffness: no Redness: no  Bruising: no Swelling: no Paresthesias / decreased sensation: no Fevers: no  Relevant past medical, surgical, family and social history reviewed and updated as indicated. Interim medical history since our last visit reviewed. Allergies and medications reviewed and updated.  Review of Systems  Constitutional: Negative.   Respiratory: Negative.   Cardiovascular: Negative.   Skin: Negative.   Psychiatric/Behavioral: Negative.     Per HPI unless specifically indicated above     Objective:    BP 137/86 (BP Location: Left Arm, Patient Position: Sitting, Cuff Size: Large)   Pulse 78   Temp 97.7 F (36.5 C)   Wt 219 lb 1.6 oz (99.4 kg)   SpO2 98%   BMI 35.36 kg/m   Wt Readings from Last 3 Encounters:  03/19/16 219 lb 1.6 oz (99.4 kg)  03/12/16 218 lb 4.8 oz (99 kg)  07/08/15 214 lb (97.1 kg)    Physical Exam  Constitutional: She is oriented to person, place, and time. She appears well-developed and well-nourished. No distress.  HENT:  Head: Normocephalic and atraumatic.  Right Ear: Hearing normal.  Left Ear: Hearing normal.  Nose: Nose normal.  Eyes: Conjunctivae and lids are normal. Right eye exhibits no discharge. Left eye  exhibits no discharge. No scleral icterus.  Cardiovascular: Normal rate, regular rhythm, normal heart sounds and intact distal pulses.  Exam reveals no gallop and no friction rub.   No murmur heard. Pulmonary/Chest: Effort normal and breath sounds normal. No respiratory distress. She has no wheezes. She has no rales. She exhibits no tenderness.  Musculoskeletal: Normal range of motion.  Neurological: She is alert and oriented to person, place, and time.  Skin: Skin is warm, dry and intact. No rash noted. No erythema. No pallor.  Thick, discolored painful separating toenail bilateral great toes R>L  Psychiatric: She has a normal mood and affect. Her speech is normal and behavior is normal. Judgment and thought content normal. Cognition and memory are normal.  Nursing note and vitals reviewed.   Results for orders placed or performed in visit on 03/12/16  Comprehensive metabolic panel  Result Value Ref Range   Glucose 78 65 - 99 mg/dL   BUN 5 (L) 6 - 24 mg/dL   Creatinine, Ser 7.821.12 (H) 0.57 - 1.00 mg/dL   GFR calc non Af Amer 60 >59 mL/min/1.73   GFR calc Af Amer 69 >59 mL/min/1.73   BUN/Creatinine Ratio 4 (L) 9 - 23   Sodium 143 134 - 144 mmol/L   Potassium 3.5 3.5 - 5.2 mmol/L   Chloride 103 96 - 106 mmol/L   CO2 26 18 - 29 mmol/L   Calcium 8.8 8.7 - 10.2 mg/dL  Total Protein 6.7 6.0 - 8.5 g/dL   Albumin 3.9 3.5 - 5.5 g/dL   Globulin, Total 2.8 1.5 - 4.5 g/dL   Albumin/Globulin Ratio 1.4 1.2 - 2.2   Bilirubin Total 0.3 0.0 - 1.2 mg/dL   Alkaline Phosphatase 93 39 - 117 IU/L   AST 18 0 - 40 IU/L   ALT 14 0 - 32 IU/L  CBC with Differential/Platelet  Result Value Ref Range   WBC 8.8 3.4 - 10.8 x10E3/uL   RBC 4.36 3.77 - 5.28 x10E6/uL   Hemoglobin 11.8 11.1 - 15.9 g/dL   Hematocrit 04.536.6 40.934.0 - 46.6 %   MCV 84 79 - 97 fL   MCH 27.1 26.6 - 33.0 pg   MCHC 32.2 31.5 - 35.7 g/dL   RDW 81.114.4 91.412.3 - 78.215.4 %   Platelets 326 150 - 379 x10E3/uL   Neutrophils 56 Not Estab. %   Lymphs 31  Not Estab. %   Monocytes 9 Not Estab. %   Eos 3 Not Estab. %   Basos 1 Not Estab. %   Neutrophils Absolute 5.0 1.4 - 7.0 x10E3/uL   Lymphocytes Absolute 2.8 0.7 - 3.1 x10E3/uL   Monocytes Absolute 0.8 0.1 - 0.9 x10E3/uL   EOS (ABSOLUTE) 0.2 0.0 - 0.4 x10E3/uL   Basophils Absolute 0.0 0.0 - 0.2 x10E3/uL   Immature Granulocytes 0 Not Estab. %   Immature Grans (Abs) 0.0 0.0 - 0.1 x10E3/uL  TSH  Result Value Ref Range   TSH 4.090 0.450 - 4.500 uIU/mL  LH  Result Value Ref Range   LH 41.1 mIU/mL  FSH  Result Value Ref Range   FSH 53.8 mIU/mL  Prolactin  Result Value Ref Range   Prolactin 8.5 4.8 - 23.3 ng/mL  Estradiol  Result Value Ref Range   Estradiol 7.0 pg/mL  Testosterone, free, total  Result Value Ref Range   Testosterone 9 8 - 48 ng/dL   Testosterone, Free 0.4 0.0 - 4.2 pg/mL   Sex Hormone Binding 25.8 24.6 - 122.0 nmol/L      Assessment & Plan:   Problem List Items Addressed This Visit    None    Visit Diagnoses    Onychomycosis of toenail    -  Primary   Will get her into podiatry. Referral generated today. Call with any concerns.    Relevant Orders   Ambulatory referral to Podiatry       Follow up plan: No Follow-up on file.

## 2016-04-10 ENCOUNTER — Ambulatory Visit: Payer: Self-pay | Admitting: Podiatry

## 2016-04-14 ENCOUNTER — Encounter: Payer: Self-pay | Admitting: Podiatry

## 2016-04-14 ENCOUNTER — Ambulatory Visit (INDEPENDENT_AMBULATORY_CARE_PROVIDER_SITE_OTHER): Payer: Medicaid Other | Admitting: Podiatry

## 2016-04-14 VITALS — BP 113/80 | HR 80

## 2016-04-14 DIAGNOSIS — L608 Other nail disorders: Secondary | ICD-10-CM

## 2016-04-14 DIAGNOSIS — M79676 Pain in unspecified toe(s): Secondary | ICD-10-CM | POA: Diagnosis not present

## 2016-04-14 DIAGNOSIS — B351 Tinea unguium: Secondary | ICD-10-CM | POA: Diagnosis not present

## 2016-04-14 DIAGNOSIS — M79609 Pain in unspecified limb: Principal | ICD-10-CM

## 2016-04-14 DIAGNOSIS — L603 Nail dystrophy: Secondary | ICD-10-CM

## 2016-04-19 NOTE — Progress Notes (Signed)
SUBJECTIVE Patient  presents to office today complaining of elongated, thickened nails. Pain while ambulating in shoes. Patient is unable to trim their own nails.   OBJECTIVE General Patient is awake, alert, and oriented x 3 and in no acute distress. Derm Skin is dry and supple bilateral. Negative open lesions or macerations. Remaining integument unremarkable. Nails are tender, long, thickened and dystrophic with subungual debris, consistent with onychomycosis, 1-5 bilateral. No signs of infection noted. Vasc  DP and PT pedal pulses palpable bilaterally. Temperature gradient within normal limits.  Neuro Epicritic and protective threshold sensation diminished bilaterally.  Musculoskeletal Exam No symptomatic pedal deformities noted bilateral. Muscular strength within normal limits.  ASSESSMENT 1. Onychodystrophic nails 1-5 bilateral with hyperkeratosis of nails.  2. Onychomycosis of nail due to dermatophyte bilateral 3. Pain in foot bilateral  PLAN OF CARE 1. Patient evaluated today.  2. Instructed to maintain good pedal hygiene and foot care.  3. Mechanical debridement of nails 1-5 bilaterally performed using a nail nipper. Filed with dremel without incident.  4. Return to clinic in 3 mos.   Total temporary nail avulsion right great toe next visit.  Felecia ShellingBrent M. Wilian Kwong, DPM [facility]  Dr. Felecia ShellingBrent M. Blayklee Mable, DPM    [address]                                        [place], [place] [address]                Office [phone]  Fax [phone]

## 2016-04-28 ENCOUNTER — Encounter: Payer: Medicaid Other | Admitting: Family Medicine

## 2016-05-07 ENCOUNTER — Ambulatory Visit: Payer: Medicaid Other | Admitting: Family Medicine

## 2016-05-08 ENCOUNTER — Ambulatory Visit: Payer: Medicaid Other | Admitting: Podiatry

## 2016-05-11 ENCOUNTER — Encounter: Payer: Self-pay | Admitting: Family Medicine

## 2016-05-13 ENCOUNTER — Encounter: Payer: Self-pay | Admitting: Family Medicine

## 2016-05-14 ENCOUNTER — Encounter: Payer: Self-pay | Admitting: Family Medicine

## 2016-05-14 ENCOUNTER — Ambulatory Visit (INDEPENDENT_AMBULATORY_CARE_PROVIDER_SITE_OTHER): Payer: Medicaid Other | Admitting: Family Medicine

## 2016-05-14 VITALS — BP 121/83 | HR 81 | Temp 98.5°F | Wt 218.2 lb

## 2016-05-14 DIAGNOSIS — H66001 Acute suppurative otitis media without spontaneous rupture of ear drum, right ear: Secondary | ICD-10-CM | POA: Diagnosis not present

## 2016-05-14 DIAGNOSIS — R202 Paresthesia of skin: Secondary | ICD-10-CM | POA: Diagnosis not present

## 2016-05-14 DIAGNOSIS — R6883 Chills (without fever): Secondary | ICD-10-CM | POA: Diagnosis not present

## 2016-05-14 LAB — BAYER DCA HB A1C WAIVED: HB A1C: 5.9 % (ref <7.0)

## 2016-05-14 LAB — VERITOR FLU A/B WAIVED
INFLUENZA B: NEGATIVE
Influenza A: NEGATIVE

## 2016-05-14 MED ORDER — AMOXICILLIN-POT CLAVULANATE 875-125 MG PO TABS: 1.0000 | ORAL_TABLET | Freq: Two times a day (BID) | ORAL | 0 refills | Status: DC

## 2016-05-14 NOTE — Progress Notes (Signed)
BP 121/83 (BP Location: Left Arm, Patient Position: Sitting, Cuff Size: Large)   Pulse 81   Temp 98.5 F (36.9 C)   Wt 218 lb 3.2 oz (99 kg)   SpO2 100%   BMI 35.22 kg/m    Subjective:    Patient ID: Margaret [name], female    DOB: 10-Mar-1971, 46 y.o.   MRN: [id number]  HPI: Margaret [name] is a 46 y.o. female  Chief Complaint  Patient presents with  . Headache    pt states she has been having headaches, neck pain, and soms chills for a couple of days  . Numbness    pt states her legs have been numb from her ankles down   UPPER RESPIRATORY TRACT INFECTION Duration: couple of days Worst symptom: diarrhea and chills Fever: no Cough: yes Shortness of breath: no Wheezing: no Chest pain: no Chest tightness: no Chest congestion: no Nasal congestion: yes Runny nose: yes Post nasal drip: yes Sneezing: yes Sore throat: yes Swollen glands: yes Sinus pressure: yes Headache: yes Face pain: no Toothache: no Ear pain: no  Ear pressure: yes bilateral Eyes red/itching:yes Eye drainage/crusting: no  Vomiting: no Rash: no Fatigue: yes Sick contacts: no Strep contacts: no  Context: worse Recurrent sinusitis: no Relief with OTC cold/cough medications: no  Treatments attempted: none   NUMBNESS- more cramping  Duration: 1-2 weeks Onset: fluctuating Location: from her ankles down Bilateral: yes Symmetric: no Decreased sensation: no  Weakness: yes Pain: yes Quality:  sharp and cramping Severity: severe  Frequency: intermittent Trauma: no Recent illness: yes Diabetes: no Thyroid disease: yes  HIV: no  Alcoholism: no  Spinal cord injury: no Status: worse   Relevant past medical, surgical, family and social history reviewed and updated as indicated. Interim medical history since our last visit reviewed. Allergies and medications reviewed and updated.  Review of Systems  Constitutional: Positive for chills, fatigue and fever. Negative for activity  change, appetite change, diaphoresis and unexpected weight change.  HENT: Positive for congestion, ear pain, nosebleeds, postnasal drip, sinus pain and sinus pressure. Negative for dental problem, drooling, ear discharge, facial swelling, hearing loss, mouth sores, rhinorrhea, sneezing, sore throat, tinnitus, trouble swallowing and voice change.   Respiratory: Negative.   Cardiovascular: Negative.   Psychiatric/Behavioral: Negative.     Per HPI unless specifically indicated above     Objective:    BP 121/83 (BP Location: Left Arm, Patient Position: Sitting, Cuff Size: Large)   Pulse 81   Temp 98.5 F (36.9 C)   Wt 218 lb 3.2 oz (99 kg)   SpO2 100%   BMI 35.22 kg/m   Wt Readings from Last 3 Encounters:  05/14/16 218 lb 3.2 oz (99 kg)  03/19/16 219 lb 1.6 oz (99.4 kg)  03/12/16 218 lb 4.8 oz (99 kg)    Physical Exam  Constitutional: She is oriented to person, place, and time. She appears well-developed and well-nourished. No distress.  Ill-appearing  HENT:  Head: Normocephalic and atraumatic.  Right Ear: Hearing, external ear and ear canal normal. Tympanic membrane is injected, erythematous and bulging.  Left Ear: Hearing, tympanic membrane, external ear and ear canal normal.  Nose: Nose normal.  Mouth/Throat: Uvula is midline, oropharynx is clear and moist and mucous membranes are normal. No oropharyngeal exudate.  Eyes: Conjunctivae, EOM and lids are normal. Pupils are equal, round, and reactive to light. Right eye exhibits no discharge. Left eye exhibits no discharge. No scleral icterus.  Neck: Normal range of motion. Neck  supple. No JVD present. No tracheal deviation present. No thyromegaly present.  Cardiovascular: Normal rate, regular rhythm, normal heart sounds and intact distal pulses.  Exam reveals no gallop and no friction rub.   No murmur heard. Pulmonary/Chest: Effort normal and breath sounds normal. No stridor. No respiratory distress. She has no wheezes. She has no  rales. She exhibits no tenderness.  Musculoskeletal: Normal range of motion.  Lymphadenopathy:    She has no cervical adenopathy.  Neurological: She is alert and oriented to person, place, and time.  Skin: Skin is warm, dry and intact. No rash noted. She is not diaphoretic. No erythema. No pallor.  Psychiatric: She has a normal mood and affect. Her speech is normal and behavior is normal. Judgment and thought content normal. Cognition and memory are normal.  Nursing note and vitals reviewed.   Results for orders placed or performed in visit on 03/12/16  Comprehensive metabolic panel  Result Value Ref Range   Glucose 78 65 - 99 mg/dL   BUN 5 (L) 6 - 24 mg/dL   Creatinine, Ser 1.191.12 (H) 0.57 - 1.00 mg/dL   GFR calc non Af Amer 60 >59 mL/min/1.73   GFR calc Af Amer 69 >59 mL/min/1.73   BUN/Creatinine Ratio 4 (L) 9 - 23   Sodium 143 134 - 144 mmol/L   Potassium 3.5 3.5 - 5.2 mmol/L   Chloride 103 96 - 106 mmol/L   CO2 26 18 - 29 mmol/L   Calcium 8.8 8.7 - 10.2 mg/dL   Total Protein 6.7 6.0 - 8.5 g/dL   Albumin 3.9 3.5 - 5.5 g/dL   Globulin, Total 2.8 1.5 - 4.5 g/dL   Albumin/Globulin Ratio 1.4 1.2 - 2.2   Bilirubin Total 0.3 0.0 - 1.2 mg/dL   Alkaline Phosphatase 93 39 - 117 IU/L   AST 18 0 - 40 IU/L   ALT 14 0 - 32 IU/L  CBC with Differential/Platelet  Result Value Ref Range   WBC 8.8 3.4 - 10.8 x10E3/uL   RBC 4.36 3.77 - 5.28 x10E6/uL   Hemoglobin 11.8 11.1 - 15.9 g/dL   Hematocrit 14.736.6 82.934.0 - 46.6 %   MCV 84 79 - 97 fL   MCH 27.1 26.6 - 33.0 pg   MCHC 32.2 31.5 - 35.7 g/dL   RDW 56.214.4 13.012.3 - 86.515.4 %   Platelets 326 150 - 379 x10E3/uL   Neutrophils 56 Not Estab. %   Lymphs 31 Not Estab. %   Monocytes 9 Not Estab. %   Eos 3 Not Estab. %   Basos 1 Not Estab. %   Neutrophils Absolute 5.0 1.4 - 7.0 x10E3/uL   Lymphocytes Absolute 2.8 0.7 - 3.1 x10E3/uL   Monocytes Absolute 0.8 0.1 - 0.9 x10E3/uL   EOS (ABSOLUTE) 0.2 0.0 - 0.4 x10E3/uL   Basophils Absolute 0.0 0.0 - 0.2  x10E3/uL   Immature Granulocytes 0 Not Estab. %   Immature Grans (Abs) 0.0 0.0 - 0.1 x10E3/uL  TSH  Result Value Ref Range   TSH 4.090 0.450 - 4.500 uIU/mL  LH  Result Value Ref Range   LH 41.1 mIU/mL  FSH  Result Value Ref Range   FSH 53.8 mIU/mL  Prolactin  Result Value Ref Range   Prolactin 8.5 4.8 - 23.3 ng/mL  Estradiol  Result Value Ref Range   Estradiol 7.0 pg/mL  Testosterone, free, total  Result Value Ref Range   Testosterone 9 8 - 48 ng/dL   Testosterone, Free 0.4 0.0 - 4.2 pg/mL  Sex Hormone Binding 25.8 24.6 - 122.0 nmol/L      Assessment & Plan:   Problem List Items Addressed This Visit    None    Visit Diagnoses    Acute suppurative otitis media of right ear without spontaneous rupture of tympanic membrane, recurrence not specified    -  Primary   Will treat with augmentin. Call with any concerns.    Relevant Medications   amoxicillin-clavulanate (AUGMENTIN) 875-125 MG tablet   Chills       Flu negative. Likely due to ear infection.   Relevant Orders   Veritor Flu A/B Waived   Paresthesias       Will check labs. Await results. Treat as needed.    Relevant Orders   CBC with Differential/Platelet   Bayer DCA Hb A1c Waived   Comprehensive metabolic panel   TSH       Follow up plan: Return if symptoms worsen or fail to improve.

## 2016-05-15 ENCOUNTER — Ambulatory Visit: Payer: Medicaid Other | Admitting: Podiatry

## 2016-05-15 ENCOUNTER — Telehealth: Payer: Self-pay | Admitting: Family Medicine

## 2016-05-15 LAB — COMPREHENSIVE METABOLIC PANEL
ALBUMIN: 4.1 g/dL (ref 3.5–5.5)
ALT: 11 IU/L (ref 0–32)
AST: 17 IU/L (ref 0–40)
Albumin/Globulin Ratio: 1.4 (ref 1.2–2.2)
Alkaline Phosphatase: 88 IU/L (ref 39–117)
BILIRUBIN TOTAL: 0.4 mg/dL (ref 0.0–1.2)
BUN / CREAT RATIO: 10 (ref 9–23)
BUN: 8 mg/dL (ref 6–24)
CALCIUM: 9.7 mg/dL (ref 8.7–10.2)
CHLORIDE: 101 mmol/L (ref 96–106)
CO2: 24 mmol/L (ref 18–29)
CREATININE: 0.81 mg/dL (ref 0.57–1.00)
GFR, EST AFRICAN AMERICAN: 102 mL/min/{1.73_m2} (ref >59)
GFR, EST NON AFRICAN AMERICAN: 89 mL/min/{1.73_m2} (ref >59)
GLUCOSE: 92 mg/dL (ref 65–99)
Globulin, Total: 2.9 g/dL (ref 1.5–4.5)
Potassium: 3.8 mmol/L (ref 3.5–5.2)
Sodium: 140 mmol/L (ref 134–144)
TOTAL PROTEIN: 7 g/dL (ref 6.0–8.5)

## 2016-05-15 LAB — TSH: TSH: 6.61 u[IU]/mL — ABNORMAL HIGH (ref 0.450–4.500)

## 2016-05-15 LAB — CBC WITH DIFFERENTIAL/PLATELET
Basophils Absolute: 0 10*3/uL (ref 0.0–0.2)
Basos: 0 %
EOS (ABSOLUTE): 0.1 10*3/uL (ref 0.0–0.4)
Eos: 1 %
HEMOGLOBIN: 12.4 g/dL (ref 11.1–15.9)
Hematocrit: 37.8 % (ref 34.0–46.6)
IMMATURE GRANS (ABS): 0 10*3/uL (ref 0.0–0.1)
IMMATURE GRANULOCYTES: 0 %
LYMPHS: 43 %
Lymphocytes Absolute: 4.4 10*3/uL — ABNORMAL HIGH (ref 0.7–3.1)
MCH: 27.7 pg (ref 26.6–33.0)
MCHC: 32.8 g/dL (ref 31.5–35.7)
MCV: 84 fL (ref 79–97)
MONOCYTES: 5 %
Monocytes Absolute: 0.5 10*3/uL (ref 0.1–0.9)
NEUTROS ABS: 5.3 10*3/uL (ref 1.4–7.0)
NEUTROS PCT: 51 %
PLATELETS: 352 10*3/uL (ref 150–379)
RBC: 4.48 x10E6/uL (ref 3.77–5.28)
RDW: 15.2 % (ref 12.3–15.4)
WBC: 10.4 10*3/uL (ref 3.4–10.8)

## 2016-05-15 NOTE — Telephone Encounter (Signed)
Called patient again and [facility] would you please call her to relay the below information on Monday? Thanks!

## 2016-05-15 NOTE — Telephone Encounter (Signed)
Called patient to give her her results. LMOM for her to call back. Her thyroid was off, which could be causing some of her symptoms. Everything else was normal.  If she has been taking her thyroid medicine every day, I'm going to increase her dose. If she has been missing days, she needs to take it every day and we'll recheck her in about 2 weeks. OK to give her this information if she calls back.

## 2016-05-15 NOTE — Telephone Encounter (Signed)
Patient would like to speak to someone regarding her lab results in which Dr Shela CommonsJ has tried to reach her several times.    Thanks  Clydie BraunKaren

## 2016-05-16 ENCOUNTER — Other Ambulatory Visit: Payer: Self-pay | Admitting: Family Medicine

## 2016-05-18 ENCOUNTER — Telehealth: Payer: Self-pay | Admitting: Family Medicine

## 2016-05-18 MED ORDER — LEVOTHYROXINE SODIUM 137 MCG PO TABS: 137.0000 ug | ORAL_TABLET | Freq: Every day | ORAL | 1 refills | Status: DC

## 2016-05-18 NOTE — Telephone Encounter (Signed)
Patient called back (in another phone note) and asked for us to call her back. Called and let patient know what Dr. Laural BenesJohnson said. Patient stated that she has been out of her medication for a couple of months. I told patient that Dr. Laural BenesJohnson was going to send in her a refill of her current dose and that she wants her to take it every day. Then come by in 2 weeks and have levels rechecked. Patient verbalized understanding. Will route back to provider for refill.

## 2016-05-18 NOTE — Telephone Encounter (Signed)
Patient came by the office to see if she could [facility] her lab results and to also request a new script for her thyroid medication.  [name] [phone]  Thanks

## 2016-05-18 NOTE — Telephone Encounter (Signed)
See other phone note

## 2016-05-18 NOTE — Telephone Encounter (Signed)
Patient has not been taking her thyroid medicine everyday because she has been out.   Patient notified to take it daily and we'll check it in two weeks.

## 2016-05-25 ENCOUNTER — Telehealth: Payer: Self-pay | Admitting: Family Medicine

## 2016-05-25 NOTE — Telephone Encounter (Signed)
OK to generate note 

## 2016-05-25 NOTE — Telephone Encounter (Signed)
Patient needs a note stating she was not able to work due to having diarrhea from the antibiotic Dr Laural BenesJohnson has given her.  She will stop by the office to pick this up if someone will handle.  Thanks

## 2016-05-25 NOTE — Telephone Encounter (Signed)
Routing to provider. Is it OK for note? 

## 2016-05-26 ENCOUNTER — Encounter: Payer: Self-pay | Admitting: Family Medicine

## 2016-05-26 NOTE — Telephone Encounter (Signed)
Spoke with patient. She needs note for Feb. 15th through the 18th due to diarrhea from antibiotic. Patient will come by and pick up note at her convenience, let her know note would be up front. Also asked her to let provider know next time she needs an antibiotic to let provider know what this one did to her and could they consider another option.

## 2016-05-26 NOTE — Telephone Encounter (Signed)
Patient has no VM on phone could not leave message

## 2016-05-26 NOTE — Telephone Encounter (Signed)
Patients letter is ready for her to pick up.  Patient will come to pick up the letter today.

## 2016-08-19 ENCOUNTER — Telehealth: Payer: Self-pay | Admitting: Family Medicine

## 2016-08-19 NOTE — Telephone Encounter (Signed)
Was discharged for chronic no-showing. I will give her 1 more chance, but if she no-shows, she will not be able to be seen again.

## 2016-08-19 NOTE — Telephone Encounter (Signed)
Patient would like to speak to someone regarding being seen in our clinic again.  She would like an appt.  Thanks  [phone]

## 2016-08-19 NOTE — Telephone Encounter (Signed)
Contacted patient and advised there that Dr. Laural BenesJohnson is willing to give her one more chance.  Reviewed our NO Show policy with patient and advised her that if she missed anymore appointments without giving a 24 hour notice that she would be dismissed.  Patient was appreciative of a second chance and states that she will abide by our policy in the future.

## 2016-08-20 ENCOUNTER — Emergency Department
Admission: EM | Admit: 2016-08-20 | Discharge: 2016-08-20 | Disposition: A | Discharge status: Home / Self Care | Payer: Medicaid Other | Attending: Emergency Medicine | Admitting: Emergency Medicine

## 2016-08-20 ENCOUNTER — Ambulatory Visit: Payer: Medicaid Other | Admitting: Family Medicine

## 2016-08-20 ENCOUNTER — Encounter: Payer: Self-pay | Admitting: Emergency Medicine

## 2016-08-20 DIAGNOSIS — F17210 Nicotine dependence, cigarettes, uncomplicated: Secondary | ICD-10-CM | POA: Insufficient documentation

## 2016-08-20 DIAGNOSIS — J069 Acute upper respiratory infection, unspecified: Secondary | ICD-10-CM | POA: Diagnosis not present

## 2016-08-20 DIAGNOSIS — R05 Cough: Secondary | ICD-10-CM | POA: Diagnosis present

## 2016-08-20 DIAGNOSIS — J45909 Unspecified asthma, uncomplicated: Secondary | ICD-10-CM | POA: Insufficient documentation

## 2016-08-20 DIAGNOSIS — Z79899 Other long term (current) drug therapy: Secondary | ICD-10-CM | POA: Diagnosis not present

## 2016-08-20 MED ORDER — GUAIFENESIN-CODEINE 100-10 MG/5ML PO SOLN: 5.0000 mL | ORAL | 0 refills | Status: DC | PRN

## 2016-08-20 MED ORDER — ONDANSETRON 4 MG PO TBDP: 4.0000 mg | ORAL_TABLET | Freq: Three times a day (TID) | ORAL | 0 refills | Status: DC | PRN

## 2016-08-20 MED ORDER — ONDANSETRON 8 MG PO TBDP
8.0000 mg | ORAL_TABLET | Freq: Once | ORAL | Status: AC
  Administered 2016-08-20: 8 mg via ORAL
  Filled 2016-08-20: qty 1

## 2016-08-20 NOTE — ED Provider Notes (Signed)
[facility] Emergency Department Provider Note ____________________________________________  Time seen: 12:32 PM  I have reviewed the triage vital signs and the nursing notes.  HISTORY  Chief Complaint  Cough; Nasal Congestion; and Headache   HPI Margaret [name] is a 46 y.o. female is here complaining of cough, congestion, sinus pressure for 2 days. Patient also states she has a productive yellow cough. Patient denies any fever. She has not been taking any over-the-counter medication for her symptoms. Patient continues to eat and drink as normal. She denies any nausea, vomiting or diarrhea. Patient does continue to smoke. She rates her pain as an 8 out of 10.  Past Medical History:  Diagnosis Date  . Asthma   . Thyroid disease     Patient Active Problem List   Diagnosis Date Noted  . Myopia 12/20/2014  . Abdominal pain 12/20/2014  . Depression 12/20/2014  . Right arm pain 11/21/2014  . Thyroid activity decreased 10/18/2014  . Abnormal CBC 10/18/2014  . Tobacco abuse 10/18/2014    Past Surgical History:  Procedure Laterality Date  . FRACTURE SURGERY      Prior to Admission medications   Medication Sig Start Date End Date Taking? Authorizing Provider  buPROPion (WELLBUTRIN XL) 150 MG 24 hr tablet Take 150 mg by mouth 2 (two) times daily. 03/12/15   [provider]  chlorhexidine (PERIDEX) 0.12 % solution SWISH AND SPIT OUT 10 ML TWICE DAILY 03/17/16   [provider]  guaiFENesin-codeine 100-10 MG/5ML syrup Take 5 mLs by mouth every 4 (four) hours as needed. 08/20/16   Tommi RumpsSummers, Georgeanna Radziewicz L, PA-C  levothyroxine (SYNTHROID, LEVOTHROID) 137 MCG tablet Take 1 tablet (137 mcg total) by mouth daily before breakfast. 05/18/16   Laural BenesJohnson, Megan P, DO  ondansetron (ZOFRAN ODT) 4 MG disintegrating tablet Take 1 tablet (4 mg total) by mouth every 8 (eight) hours as needed for nausea or vomiting. 08/20/16   Tommi RumpsSummers, Daley Mooradian L, PA-C     Allergies Patient has no known allergies.  Family History  Problem Relation Age of Onset  . Family history unknown: Yes    Social History Social History  Substance Use Topics  . Smoking status: Current Every Day Smoker    Packs/day: 0.25    Types: Cigarettes  . Smokeless tobacco: Never Used  . Alcohol use 0.0 oz/week     Comment: on occasion    Review of Systems  Constitutional: Negative for fever. Eyes: Negative for visual changes. ENT: Negative for sore throat.Positive for sinus pressure. Cardiovascular: Negative for chest pain. Respiratory: Negative for shortness of breath. Positive for cough. Gastrointestinal: Negative for abdominal pain, vomiting and diarrhea. Musculoskeletal: Negative for muscle aches. Skin: Negative for rash. Neurological: Negative for headaches, focal weakness or numbness. ____________________________________________  PHYSICAL EXAM:  VITAL SIGNS: ED Triage Vitals  Enc Vitals Group     BP 08/20/16 1210 102/60     Pulse Rate 08/20/16 1210 97     Resp 08/20/16 1210 18     Temp 08/20/16 1210 99.2 F (37.3 C)     Temp Source 08/20/16 1210 Oral     SpO2 08/20/16 1210 99 %     Weight 08/20/16 1210 215 lb (97.5 kg)     Height 08/20/16 1210 5' 5.5" (1.664 m)     Head Circumference --      Peak Flow --      Pain Score 08/20/16 1221 8     Pain Loc --      Pain Edu? --  Excl. in GC? --     Constitutional: Alert and oriented. Well appearing and in no distress. Head: Normocephalic and atraumatic. Eyes: Conjunctivae are normal. PERRL. Normal extraocular movements Ears: Canals clear. TMs intact bilaterallyBut dull with poor light reflex.. Nose: Moderate congestion/rhinorrhea. Mouth/Throat: Mucous membranes are moist. Neck: Supple. Hematological/Lymphatic/Immunological: No cervical lymphadenopathy. Cardiovascular: Normal rate, regular rhythm. Normal distal pulses. Respiratory: Normal respiratory effort. No  wheezes/rales/rhonchi. Musculoskeletal: Nontender with normal range of motion in all extremities.  Neurologic:  Normal gait without ataxia. Normal speech and language. No gross focal neurologic deficits are appreciated. Skin:  Skin is warm, dry and intact. No rash noted. Psychiatric: Mood and affect are normal. Patient exhibits appropriate insight and judgment. ____________________________________________  INITIAL IMPRESSION / ASSESSMENT AND PLAN / ED COURSE  Patient is here with respiratory symptoms and has not taken any over-the-counter medication prior to arrival in the last 2 days. She is given a prescription for Robitussin-AC as needed for cough and congestion. She is also given Zofran if needed for nausea. She was encouraged to take Tylenol or ibuprofen if needed for muscle aches, headache or sinus pain. She is follow-up with her PCP if any continued problems. She'll increase fluids and encouraged to decrease smoking.    ____________________________________________  FINAL CLINICAL IMPRESSION(S) / ED DIAGNOSES  Final diagnoses:  Acute upper respiratory infection     Tommi Rumps, PA-C 08/20/16 1551    Sharman Cheek, MD 08/23/16 1547

## 2016-08-20 NOTE — ED Triage Notes (Signed)
Pt presents to ED c/o cough, congestion, and sinus pressure starting Tuesday night. Cough productive of yellow sputum.

## 2016-08-20 NOTE — Discharge Instructions (Signed)
Follow-up with your primary care provider if any continued problems in 4-5 days. Increase fluids. Decrease smoking. He may also take Tylenol or ibuprofen as needed for fever or body aches. Robitussin-AC for cough and congestion. This medication contains a narcotic. Do not drive and take this medication as it could cause drowsiness. Zofran if needed for nausea.

## 2016-09-04 DIAGNOSIS — I480 Paroxysmal atrial fibrillation: Secondary | ICD-10-CM

## 2016-09-04 HISTORY — DX: Paroxysmal atrial fibrillation: I48.0

## 2016-09-06 ENCOUNTER — Encounter: Payer: Self-pay | Admitting: Emergency Medicine

## 2016-09-06 ENCOUNTER — Emergency Department: Payer: Medicaid Other

## 2016-09-06 ENCOUNTER — Inpatient Hospital Stay
Admission: EM | Admit: 2016-09-06 | Discharge: 2016-09-08 | DRG: 066 | Disposition: A | Discharge status: Home / Self Care | Payer: Medicaid Other | Attending: Internal Medicine | Admitting: Internal Medicine

## 2016-09-06 ENCOUNTER — Other Ambulatory Visit: Payer: Self-pay

## 2016-09-06 DIAGNOSIS — R519 Headache, unspecified: Secondary | ICD-10-CM

## 2016-09-06 DIAGNOSIS — E785 Hyperlipidemia, unspecified: Secondary | ICD-10-CM | POA: Diagnosis present

## 2016-09-06 DIAGNOSIS — R51 Headache: Secondary | ICD-10-CM

## 2016-09-06 DIAGNOSIS — R202 Paresthesia of skin: Secondary | ICD-10-CM | POA: Diagnosis present

## 2016-09-06 DIAGNOSIS — R200 Anesthesia of skin: Secondary | ICD-10-CM

## 2016-09-06 DIAGNOSIS — R29701 NIHSS score 1: Secondary | ICD-10-CM | POA: Diagnosis present

## 2016-09-06 DIAGNOSIS — J45909 Unspecified asthma, uncomplicated: Secondary | ICD-10-CM | POA: Diagnosis present

## 2016-09-06 DIAGNOSIS — R531 Weakness: Secondary | ICD-10-CM | POA: Diagnosis present

## 2016-09-06 DIAGNOSIS — Z823 Family history of stroke: Secondary | ICD-10-CM

## 2016-09-06 DIAGNOSIS — Z8673 Personal history of transient ischemic attack (TIA), and cerebral infarction without residual deficits: Secondary | ICD-10-CM

## 2016-09-06 DIAGNOSIS — E042 Nontoxic multinodular goiter: Secondary | ICD-10-CM | POA: Diagnosis present

## 2016-09-06 DIAGNOSIS — I6350 Cerebral infarction due to unspecified occlusion or stenosis of unspecified cerebral artery: Secondary | ICD-10-CM | POA: Diagnosis not present

## 2016-09-06 DIAGNOSIS — I639 Cerebral infarction, unspecified: Principal | ICD-10-CM | POA: Diagnosis present

## 2016-09-06 DIAGNOSIS — I340 Nonrheumatic mitral (valve) insufficiency: Secondary | ICD-10-CM | POA: Diagnosis not present

## 2016-09-06 DIAGNOSIS — F17210 Nicotine dependence, cigarettes, uncomplicated: Secondary | ICD-10-CM | POA: Diagnosis present

## 2016-09-06 DIAGNOSIS — I6330 Cerebral infarction due to thrombosis of unspecified cerebral artery: Secondary | ICD-10-CM

## 2016-09-06 LAB — COMPREHENSIVE METABOLIC PANEL
ALBUMIN: 3.6 g/dL (ref 3.5–5.0)
ALT: 13 U/L — ABNORMAL LOW (ref 14–54)
ANION GAP: 6 (ref 5–15)
AST: 23 U/L (ref 15–41)
Alkaline Phosphatase: 62 U/L (ref 38–126)
BUN: 8 mg/dL (ref 6–20)
CHLORIDE: 108 mmol/L (ref 101–111)
CO2: 25 mmol/L (ref 22–32)
Calcium: 8.7 mg/dL — ABNORMAL LOW (ref 8.9–10.3)
Creatinine, Ser: 0.83 mg/dL (ref 0.44–1.00)
GFR calc Af Amer: >60 mL/min (ref >60)
Glucose, Bld: 101 mg/dL — ABNORMAL HIGH (ref 65–99)
POTASSIUM: 2.9 mmol/L — AB (ref 3.5–5.1)
Sodium: 139 mmol/L (ref 135–145)
TOTAL PROTEIN: 7.2 g/dL (ref 6.5–8.1)
Total Bilirubin: 0.4 mg/dL (ref 0.3–1.2)

## 2016-09-06 LAB — DIFFERENTIAL
BASOS PCT: 1 %
Basophils Absolute: 0.1 10*3/uL (ref 0–0.1)
EOS ABS: 0.1 10*3/uL (ref 0–0.7)
EOS PCT: 1 %
LYMPHS ABS: 4.4 10*3/uL — AB (ref 1.0–3.6)
Lymphocytes Relative: 34 %
MONOS PCT: 5 %
Monocytes Absolute: 0.6 10*3/uL (ref 0.2–0.9)
NEUTROS PCT: 59 %
Neutro Abs: 7.7 10*3/uL — ABNORMAL HIGH (ref 1.4–6.5)

## 2016-09-06 LAB — LIPID PANEL
CHOL/HDL RATIO: 5.4 ratio
Cholesterol: 158 mg/dL (ref 0–200)
HDL: 29 mg/dL — AB (ref >40)
LDL Cholesterol: 102 mg/dL — ABNORMAL HIGH (ref 0–99)
Triglycerides: 135 mg/dL (ref <150)
VLDL: 27 mg/dL (ref 0–40)

## 2016-09-06 LAB — PROTIME-INR
INR: 1.13
Prothrombin Time: 14.6 seconds (ref 11.4–15.2)

## 2016-09-06 LAB — TROPONIN I

## 2016-09-06 LAB — CBC
HCT: 36.3 % (ref 35.0–47.0)
Hemoglobin: 12.1 g/dL (ref 12.0–16.0)
MCH: 27.6 pg (ref 26.0–34.0)
MCHC: 33.4 g/dL (ref 32.0–36.0)
MCV: 82.6 fL (ref 80.0–100.0)
Platelets: 333 10*3/uL (ref 150–440)
RBC: 4.39 MIL/uL (ref 3.80–5.20)
RDW: 14.6 % — AB (ref 11.5–14.5)
WBC: 13 10*3/uL — ABNORMAL HIGH (ref 3.6–11.0)

## 2016-09-06 LAB — GLUCOSE, CAPILLARY: GLUCOSE-CAPILLARY: 96 mg/dL (ref 65–99)

## 2016-09-06 LAB — APTT: APTT: 30 s (ref 24–36)

## 2016-09-06 MED ORDER — GADOBENATE DIMEGLUMINE 529 MG/ML IV SOLN
20.0000 mL | Freq: Once | INTRAVENOUS | Status: AC | PRN
  Administered 2016-09-06: 20 mL via INTRAVENOUS

## 2016-09-06 MED ORDER — ATORVASTATIN CALCIUM 20 MG PO TABS
40.0000 mg | ORAL_TABLET | Freq: Every day | ORAL | Status: DC
  Administered 2016-09-07 – 2016-09-08 (×2): 40 mg via ORAL
  Filled 2016-09-06 (×2): qty 2

## 2016-09-06 MED ORDER — METOCLOPRAMIDE HCL 5 MG/ML IJ SOLN
10.0000 mg | INTRAMUSCULAR | Status: AC
  Administered 2016-09-06: 10 mg via INTRAVENOUS
  Filled 2016-09-06: qty 2

## 2016-09-06 MED ORDER — HEPARIN SODIUM (PORCINE) 5000 UNIT/ML IJ SOLN
5000.0000 [IU] | Freq: Three times a day (TID) | INTRAMUSCULAR | Status: DC
  Administered 2016-09-06 – 2016-09-08 (×5): 5000 [IU] via SUBCUTANEOUS
  Filled 2016-09-06 (×7): qty 1

## 2016-09-06 MED ORDER — STROKE: EARLY STAGES OF RECOVERY BOOK
Freq: Once | Status: AC
  Administered 2016-09-06: 22:00:00

## 2016-09-06 MED ORDER — DEXAMETHASONE SODIUM PHOSPHATE 10 MG/ML IJ SOLN
10.0000 mg | Freq: Once | INTRAMUSCULAR | Status: AC
  Administered 2016-09-06: 10 mg via INTRAVENOUS
  Filled 2016-09-06: qty 1

## 2016-09-06 MED ORDER — POTASSIUM CHLORIDE CRYS ER 20 MEQ PO TBCR
40.0000 meq | EXTENDED_RELEASE_TABLET | Freq: Once | ORAL | Status: AC
  Administered 2016-09-06: 40 meq via ORAL
  Filled 2016-09-06: qty 2

## 2016-09-06 MED ORDER — DIPHENHYDRAMINE HCL 50 MG/ML IJ SOLN
12.5000 mg | Freq: Once | INTRAMUSCULAR | Status: AC
  Administered 2016-09-06: 12.5 mg via INTRAVENOUS
  Filled 2016-09-06: qty 1

## 2016-09-06 MED ORDER — SODIUM CHLORIDE 0.9 % IV BOLUS (SEPSIS)
500.0000 mL | INTRAVENOUS | Status: AC
  Administered 2016-09-06: 500 mL via INTRAVENOUS

## 2016-09-06 MED ORDER — DOCUSATE SODIUM 100 MG PO CAPS
100.0000 mg | ORAL_CAPSULE | Freq: Two times a day (BID) | ORAL | Status: DC | PRN
  Administered 2016-09-07: 22:00:00 100 mg via ORAL
  Filled 2016-09-06: qty 1

## 2016-09-06 MED ORDER — LEVOTHYROXINE SODIUM 137 MCG PO TABS
137.0000 ug | ORAL_TABLET | Freq: Every day | ORAL | Status: DC
  Administered 2016-09-07: 11:00:00 137 ug via ORAL
  Filled 2016-09-06: qty 1

## 2016-09-06 MED ORDER — ASPIRIN 325 MG PO TABS
325.0000 mg | ORAL_TABLET | Freq: Every day | ORAL | Status: DC
Start: 2016-09-07 — End: 2016-09-08
  Administered 2016-09-07: 17:00:00 325 mg via ORAL
  Filled 2016-09-06 (×3): qty 1

## 2016-09-06 MED ORDER — ASPIRIN 81 MG PO CHEW
324.0000 mg | CHEWABLE_TABLET | Freq: Once | ORAL | Status: AC
  Administered 2016-09-06: 324 mg via ORAL
  Filled 2016-09-06: qty 4

## 2016-09-06 MED ORDER — POTASSIUM CHLORIDE CRYS ER 20 MEQ PO TBCR
20.0000 meq | EXTENDED_RELEASE_TABLET | Freq: Two times a day (BID) | ORAL | Status: DC
  Administered 2016-09-06 – 2016-09-07 (×3): 20 meq via ORAL
  Filled 2016-09-06 (×3): qty 1

## 2016-09-06 MED ORDER — KETOROLAC TROMETHAMINE 30 MG/ML IJ SOLN
15.0000 mg | Freq: Once | INTRAMUSCULAR | Status: AC
  Administered 2016-09-06: 15 mg via INTRAVENOUS
  Filled 2016-09-06: qty 1

## 2016-09-06 NOTE — ED Notes (Signed)
Pt returned from MRI, placed back on monitor. Pt is resting.

## 2016-09-06 NOTE — ED Notes (Signed)
ED Provider at bedside. 

## 2016-09-06 NOTE — ED Notes (Signed)
Pt resting in bed at this time. Respirations even and unlabored, VSS and WNL. Will continue to monitor for further patient needs at this time.

## 2016-09-06 NOTE — ED Provider Notes (Signed)
[facility] Emergency Department Provider Note  ____________________________________________   First MD Initiated Contact with Patient 09/06/16 1705     (approximate)  I have reviewed the triage vital signs and the nursing notes.   HISTORY  Chief Complaint Numbness    HPI Margaret [name] is a 46 y.o. female with medical history as listed below who presents for evaluation of acute onset left arm numbness (decreased sensation) starting about 18 hours ago, followed hours later by a moderate left-sided headache and facial pain and some tingling in the left side of her face.  She reports that she was going to a concert last night and before the concert she developed the decreased sensation in her left arm.  After that started the other symptoms began she is uncertain about when exactly the headache began except that it was hours after the left arm numbness.  She does admit to a small amount of alcohol ingestion last night.  She has tired today but she had a late night last night as well.  She denies fever/chills, chest pain, shortness of breath, nausea, vomiting, abdominal pain.  Describes the symptoms as mild but she is very concerned about it because she is active in her job and wants to know why her arm is numb.  She denies any weakness / loss of strength in any of her extremities.  She is not having any difficulty with ambulation except that she feels tired.  She does report that she has been suffering from some sinus pain and upper respiratory infection symptoms for a couple of weeks.  It seems like it is gotten better recently but she was seen about 10 days ago in the emergency department for sinus issues.  She reports having a grandfather with a history of strokebut no first-degree relatives and no personal history of blood clots in her legs nor lungs.  Nothing in particular makes the patient's symptoms better nor worse.     Past Medical History:    Diagnosis Date  . Asthma   . Thyroid disease     Patient Active Problem List   Diagnosis Date Noted  . Myopia 12/20/2014  . Abdominal pain 12/20/2014  . Depression 12/20/2014  . Right arm pain 11/21/2014  . Thyroid activity decreased 10/18/2014  . Abnormal CBC 10/18/2014  . Tobacco abuse 10/18/2014    Past Surgical History:  Procedure Laterality Date  . FRACTURE SURGERY      Prior to Admission medications   Medication Sig Start Date End Date Taking? Authorizing Provider  guaiFENesin-codeine 100-10 MG/5ML syrup Take 5 mLs by mouth every 4 (four) hours as needed. 08/20/16  Yes Tommi Rumps, PA-C  levothyroxine (SYNTHROID, LEVOTHROID) 137 MCG tablet Take 1 tablet (137 mcg total) by mouth daily before breakfast. 05/18/16  Yes Johnson, Megan P, DO  ondansetron (ZOFRAN ODT) 4 MG disintegrating tablet Take 1 tablet (4 mg total) by mouth every 8 (eight) hours as needed for nausea or vomiting. 08/20/16  Yes Tommi Rumps, PA-C    Allergies Patient has no known allergies.  Family History  Problem Relation Age of Onset  . Family history unknown: Yes    Social History Social History  Substance Use Topics  . Smoking status: Current Every Day Smoker    Packs/day: 0.25    Types: Cigarettes  . Smokeless tobacco: Never Used  . Alcohol use 0.0 oz/week     Comment: on occasion    Review of Systems Constitutional: No fever/chills Eyes:  No visual changes. ENT: No sore throat. Cardiovascular: Denies chest pain. Respiratory: Denies shortness of breath. Gastrointestinal: No abdominal pain.  No nausea, no vomiting.  No diarrhea.  No constipation. Genitourinary: Negative for dysuria. Musculoskeletal: Negative for neck pain.  Negative for back pain. Integumentary: Negative for rash. Neurological: Acute onset LUE numbness about 18 hours ago, followed hours later with left sided headache and facial "tingling".  No focal weakness nor difficulty  ambulating.   ____________________________________________   PHYSICAL EXAM:  VITAL SIGNS: ED Triage Vitals  Enc Vitals Group     BP 09/06/16 1613 120/82     Pulse Rate 09/06/16 1613 81     Resp 09/06/16 1613 16     Temp 09/06/16 1613 98.7 F (37.1 C)     Temp src --      SpO2 09/06/16 1613 99 %     Weight 09/06/16 1613 97.1 kg (214 lb)     Height 09/06/16 1613 1.676 m (5\' 6" )     Head Circumference --      Peak Flow --      Pain Score 09/06/16 1612 8     Pain Loc --      Pain Edu? --      Excl. in GC? --     Constitutional: Alert and oriented. Well appearing and in no acute distress. Eyes: Conjunctivae are normal. PERRL. EOMI. Head: Atraumatic. Nose: No congestion/rhinnorhea. Mouth/Throat: Mucous membranes are moist. Neck: No stridor.  No meningeal signs.   Cardiovascular: Normal rate, regular rhythm. Good peripheral circulation. Grossly normal heart sounds. Respiratory: Normal respiratory effort.  No retractions. Lungs CTAB. Gastrointestinal: Soft and nontender. No distention.  Musculoskeletal: No lower extremity tenderness nor edema. No gross deformities of extremities. Neurologic:  Normal speech and language. No gross focal neurologic deficits are appreciated.  She denies any decreased sensation on the side of her face but she reports decreased sensation of her left upper extremity.  She has no loss of motor strength in any extremity.  Please see NIH stroke scale below.   Skin:  Skin is warm, dry and intact. No rash noted. Psychiatric: Mood and affect are normal. Speech and behavior are normal.  ____________________________________________   LABS (all labs ordered are listed, but only abnormal results are displayed)  Labs Reviewed  CBC - Abnormal; Notable for the following:       Result Value   WBC 13.0 (*)    RDW 14.6 (*)    All other components within normal limits  DIFFERENTIAL - Abnormal; Notable for the following:    Neutro Abs 7.7 (*)    Lymphs Abs 4.4  (*)    All other components within normal limits  COMPREHENSIVE METABOLIC PANEL - Abnormal; Notable for the following:    Potassium 2.9 (*)    Glucose, Bld 101 (*)    Calcium 8.7 (*)    ALT 13 (*)    All other components within normal limits  PROTIME-INR  APTT  TROPONIN I  GLUCOSE, CAPILLARY  CBG MONITORING, ED   ____________________________________________  EKG  ED ECG REPORT I, Monica Zahler, the attending physician, personally viewed and interpreted this ECG.  Date: 09/06/2016 EKG Time: 16:22 Rate: 78 Rhythm: normal sinus rhythm QRS Axis: normal Intervals: normal ST/T Wave abnormalities: Non-specific ST segment / T-wave changes, but no evidence of acute ischemia. (Inverted T wave in lead III) Conduction Disturbances: none Narrative Interpretation: Evidence of acute ischemia  ____________________________________________  RADIOLOGY   Ct Head Wo Contrast  Result Date:  09/06/2016 CLINICAL DATA:  Left arm weakness and numbness beginning last night. EXAM: CT HEAD WITHOUT CONTRAST TECHNIQUE: Contiguous axial images were obtained from the base of the skull through the vertex without intravenous contrast. COMPARISON:  None. FINDINGS: Brain: Appears normal without hemorrhage, infarct, mass lesion, mass effect, midline shift or abnormal extra-axial fluid collection. No hydrocephalus or pneumocephalus. Vascular: Negative. Skull: Intact. Sinuses/Orbits: Negative. Other: None. IMPRESSION: Normal head CT. Electronically Signed   By: Drusilla Kanner M.D.   On: 09/06/2016 16:38   Mr Laqueta Jean NW Contrast  Result Date: 09/06/2016 CLINICAL DATA:  Left upper extremity numbness. Left-sided headache. Recent sinusitis. EXAM: MRI HEAD WITHOUT AND WITH CONTRAST TECHNIQUE: Multiplanar, multiecho pulse sequences of the brain and surrounding structures were obtained without and with intravenous contrast. CONTRAST:  20mL MULTIHANCE GADOBENATE DIMEGLUMINE 529 MG/ML IV SOLN COMPARISON:  Head CT from  earlier today FINDINGS: Brain: There is roughly linear patchy restricted diffusion in the posterior right frontal lobe, cortex and white matter, consistent with acute infarct. There is a small area of cortical based gliosis in the left parietal lobe, with subtle low-density appearance on previous head CT. No acute hemorrhage, hydrocephalus, or mass. Vascular: Major flow voids are preserved. 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 Patent major dural venous sinuses. Skull and upper cervical spine: Negative for marrow lesion. Sinuses/Orbits: Intermittently and variably limited by artifact from braces. Limited, mild mucosal thickening in the upper maxillary sinuses. No acute sinusitis suspected. IMPRESSION: 1. Small cluster of subcentimeter acute infarcts in the posterior right frontal lobe. 2. Small area of left parietal cortex gliosis, possibly remote infarct. 3. History of recent sinusitis and clinical concern for complicated sinusitis. No active sinusitis seen today. Electronically Signed   By: Marnee Spring M.D.   On: 09/06/2016 18:55    ____________________________________________   PROCEDURES  Critical Care performed: Yes, see critical care procedure note(s)   Procedure(s) performed:   .Critical Care Performed by: Loleta Rose Authorized by: Loleta Rose   Critical care provider statement:    Critical care time (minutes):  30   Critical care time was exclusive of:  Separately billable procedures and treating other patients   Critical care was necessary to treat or prevent imminent or life-threatening deterioration of the following conditions:  CNS failure or compromise   Critical care was time spent personally by me on the following activities:  Development of treatment plan with patient or surrogate, discussions with consultants, evaluation of patient's response to treatment, examination of patient, obtaining history from patient or surrogate, ordering  and performing treatments and interventions, ordering and review of laboratory studies, ordering and review of radiographic studies, pulse oximetry, re-evaluation of patient's condition and review of old charts      ____________________________________________   INITIAL IMPRESSION / ASSESSMENT AND PLAN / ED COURSE  Pertinent labs & imaging results that were available during my care of the patient were reviewed by me and considered in my medical decision making (see chart for details).  NIH Stroke Scale  Interval: Baseline Time: 5:15 PM Person Administering Scale: Bathsheba Durrett  Administer stroke scale items in the order listed. Record performance in each category after each subscale exam. Do not go back and change scores. Follow directions provided for each exam technique. Scores should reflect what the patient does, not what the clinician thinks the patient can do. The clinician should record answers while administering the exam and work quickly. Except where indicated, the patient should not be coached (i.e.,  repeated requests to patient to make a special effort).   1a  Level of consciousness: 0=alert; keenly responsive  1b. LOC questions:  0=Performs both tasks correctly  1c. LOC commands: 0=Performs both tasks correctly  2.  Best Gaze: 0=normal  3.  Visual: 0=No visual loss  4. Facial Palsy: 0=Normal symmetric movement  5a.  Motor left arm: 0=No drift, limb holds 90 (or 45) degrees for full 10 seconds  5b.  Motor right arm: 0=No drift, limb holds 90 (or 45) degrees for full 10 seconds  6a. motor left leg: 0=No drift, limb holds 90 (or 45) degrees for full 10 seconds  6b  Motor right leg:  0=No drift, limb holds 90 (or 45) degrees for full 10 seconds  7. Limb Ataxia: 0=Absent  8.  Sensory: 1=Mild to moderate sensory loss; patient feels pinprick is less sharp or is dull on the affected side; there is a loss of superficial pain with pinprick but patient is aware She is being  touched  9. Best Language:  0=No aphasia, normal  10. Dysarthria: 0=Normal  11. Extinction and Inattention: 0=No abnormality  12. Distal motor function: 0=Normal   Total:   1   The patient is not a candidate for TPA even if this does represent a CVA because of the onset of her symptoms being approximately 18 hours ago and a very low NIH stroke scale of 1 with no weakness and only slight sensory deficit.  Her symptoms may represent a complicated migraine although her headache did not start until after the onset of numbness.  I am also concerned about the possibility,  Though rare, of a sinus thrombosis given that she has been suffering from upper respiratory and sinus symptoms for several weeks and now has headache and neurological deficits that could be attributable to cerebral venous sinus thrombosis or cavernous sinus thrombosis.  Patient has braces which will likely limit the futility of the images, but I spoke with Dr. Grace IsaacWatts with neuroradiology and we agreed to proceed with MR brain with and without contrast as the most efficient modality of investigating her constellation of symptoms.  I will also treat the patient empirically as a migraine to see if we can alleviate her discomfort as well.  She agrees with the current plan.  Clinical Course as of Sep 07 1915  Wynelle LinkSun Sep 06, 2016  1903 Acute CVA on MRI, no evidence of sinus thrombosis.  As documented above, patient is outside the window for tPA and has an NIHSS of 1, not eligible for tPA.  Will give full dose ASA (passed stroke screen), and will admit for further stroke management.  [CF]    Clinical Course User Index [CF] Loleta RoseForbach, Pharrah Rottman, MD    ____________________________________________  FINAL CLINICAL IMPRESSION(S) / ED DIAGNOSES  Final diagnoses:  Left-sided headache  Left upper extremity numbness  Acute CVA (cerebrovascular accident) [facility](HCC)     MEDICATIONS GIVEN DURING THIS VISIT:  Medications  sodium chloride 0.9 % bolus 500 mL  (0 mLs Intravenous Stopped 09/06/16 1911)  ketorolac (TORADOL) 30 MG/ML injection 15 mg (15 mg Intravenous Given 09/06/16 1747)  dexamethasone (DECADRON) injection 10 mg (10 mg Intravenous Given 09/06/16 1746)  metoCLOPramide (REGLAN) injection 10 mg (10 mg Intravenous Given 09/06/16 1748)  diphenhydrAMINE (BENADRYL) injection 12.5 mg (12.5 mg Intravenous Given 09/06/16 1750)  gadobenate dimeglumine (MULTIHANCE) injection 20 mL (20 mLs Intravenous Contrast Given 09/06/16 1835)  potassium chloride SA (K-DUR,KLOR-CON) CR tablet 40 mEq (40 mEq Oral Given 09/06/16 1911)  aspirin chewable tablet 324 mg (324 mg Oral Given 09/06/16 1911)     NEW OUTPATIENT MEDICATIONS STARTED DURING THIS VISIT:  New Prescriptions   No medications on file    Modified Medications   No medications on file    Discontinued Medications   BUPROPION (WELLBUTRIN XL) 150 MG 24 HR TABLET    Take 150 mg by mouth 2 (two) times daily.   CHLORHEXIDINE (PERIDEX) 0.12 % SOLUTION    SWISH AND SPIT OUT 10 ML TWICE DAILY     Note:  This document was prepared using Dragon voice recognition software and may include unintentional dictation errors.    Loleta Rose, MD 09/06/16 [phone]

## 2016-09-06 NOTE — ED Notes (Signed)
Patient transported to CT 

## 2016-09-06 NOTE — ED Notes (Addendum)
Dr. Derrill KayGoodman notified of pt condition.  Orders will remain the same.

## 2016-09-06 NOTE — ED Triage Notes (Addendum)
Pt in via POV with complaints of left arm numbness and left side headache since 2300 last night.  No other neuro deficits at this time.  Pt A/Ox4, vitals WDL.

## 2016-09-06 NOTE — ED Notes (Signed)
Pt taken to MRI at this time

## 2016-09-06 NOTE — H&P (Signed)
[facility] - [place] [facility]   PATIENT NAME: Margaret [name]    MR#:  [id number]  DATE OF BIRTH:  01/03/1971  DATE OF ADMISSION:  09/06/2016  PRIMARY CARE PHYSICIAN: Dorcas CarrowJohnson, Megan P, DO   REQUESTING/REFERRING PHYSICIAN: forback  CHIEF COMPLAINT:   Chief Complaint  Patient presents with  . Numbness    HISTORY OF PRESENT ILLNESS: Margaret Baronia Villers  is a 46 y.o. female with a known history of Asthma, thyroid disease - started to have severe headache and left arm numbness and weakness since yesterday afternoon. Concerned with this she came to emergency room today initially ER physician thought it is a migraine as CT scan of the head was negative and give some pain medication. He also ordered a MRI on the brain and it reported positive for acute stroke so he suggested to admit to medical service for further management.  PAST MEDICAL HISTORY:   Past Medical History:  Diagnosis Date  . Asthma   . Thyroid disease     PAST SURGICAL HISTORY: Past Surgical History:  Procedure Laterality Date  . FRACTURE SURGERY      SOCIAL HISTORY:  Social History  Substance Use Topics  . Smoking status: Current Every Day Smoker    Packs/day: 0.25    Types: Cigarettes  . Smokeless tobacco: Never Used  . Alcohol use 0.0 oz/week     Comment: on occasion    FAMILY HISTORY:  Family History  Problem Relation Age of Onset  . Stroke Father     DRUG ALLERGIES: No Known Allergies  REVIEW OF SYSTEMS:   CONSTITUTIONAL: No fever, fatigue or weakness.  EYES: No blurred or double vision.  EARS, NOSE, AND THROAT: No tinnitus or ear pain.  RESPIRATORY: No cough, shortness of breath, wheezing or hemoptysis.  CARDIOVASCULAR: No chest pain, orthopnea, edema.  GASTROINTESTINAL: No nausea, vomiting, diarrhea or abdominal pain.  GENITOURINARY: No dysuria, hematuria.  ENDOCRINE: No polyuria, nocturia,  HEMATOLOGY: No anemia, easy bruising or bleeding SKIN: No rash or lesion. MUSCULOSKELETAL:  No joint pain or arthritis.   NEUROLOGIC: No tingling, numbness,Left upper extremity  weakness.  PSYCHIATRY: No anxiety or depression.   MEDICATIONS AT HOME:  Prior to Admission medications   Medication Sig Start Date End Date Taking? Authorizing Provider  guaiFENesin-codeine 100-10 MG/5ML syrup Take 5 mLs by mouth every 4 (four) hours as needed. 08/20/16  Yes Tommi RumpsSummers, Rhonda L, PA-C  levothyroxine (SYNTHROID, LEVOTHROID) 137 MCG tablet Take 1 tablet (137 mcg total) by mouth daily before breakfast. 05/18/16  Yes Johnson, Megan P, DO  ondansetron (ZOFRAN ODT) 4 MG disintegrating tablet Take 1 tablet (4 mg total) by mouth every 8 (eight) hours as needed for nausea or vomiting. 08/20/16  Yes Bridget HartshornSummers, Rhonda L, PA-C      PHYSICAL EXAMINATION:   VITAL SIGNS: Blood pressure 110/80, pulse 82, temperature 98.6 F (37 C), resp. rate 20, height 5\' 6"  (1.676 m), weight 97.1 kg (214 lb), SpO2 98 %.  GENERAL:  46 y.o.-year-old patient lying in the bed with no acute distress.  EYES: Pupils equal, round, reactive to light and accommodation. No scleral icterus. Extraocular muscles intact.  HEENT: Head atraumatic, normocephalic. Oropharynx and nasopharynx clear.  NECK:  Supple, no jugular venous distention. No thyroid enlargement, no tenderness.  LUNGS: Normal breath sounds bilaterally, no wheezing, rales,rhonchi or crepitation. No use of accessory muscles of respiration.  CARDIOVASCULAR: S1, S2 normal. No murmurs, rubs, or gallops.  ABDOMEN: Soft, nontender, nondistended. Bowel sounds present. No organomegaly or  mass.  EXTREMITIES: No pedal edema, cyanosis, or clubbing.  NEUROLOGIC: Cranial nerves II through XII are intact. Muscle strength 5/5 in all extremities,Except power is 4 out of 5 in left hand. Sensation intact. Gait not checked.  PSYCHIATRIC: The patient is alert and oriented x 3.  SKIN: No obvious rash, lesion, or ulcer.   LABORATORY PANEL:   CBC  Recent Labs Lab 09/06/16 1618  WBC 13.0*   HGB 12.1  HCT 36.3  PLT 333  MCV 82.6  MCH 27.6  MCHC 33.4  RDW 14.6*  LYMPHSABS 4.4*  MONOABS 0.6  EOSABS 0.1  BASOSABS 0.1   ------------------------------------------------------------------------------------------------------------------  Chemistries   Recent Labs Lab 09/06/16 1618  NA 139  K 2.9*  CL 108  CO2 25  GLUCOSE 101*  BUN 8  CREATININE 0.83  CALCIUM 8.7*  AST 23  ALT 13*  ALKPHOS 62  BILITOT 0.4   ------------------------------------------------------------------------------------------------------------------ estimated creatinine clearance is 101.6 mL/min (by C-G formula based on SCr of 0.83 mg/dL). ------------------------------------------------------------------------------------------------------------------ No results for input(s): TSH, T4TOTAL, T3FREE, THYROIDAB in the last 72 hours.  Invalid input(s): FREET3   Coagulation profile  Recent Labs Lab 09/06/16 1618  INR 1.13   ------------------------------------------------------------------------------------------------------------------- No results for input(s): DDIMER in the last 72 hours. -------------------------------------------------------------------------------------------------------------------  Cardiac Enzymes  Recent Labs Lab 09/06/16 1618  TROPONINI <0.03   ------------------------------------------------------------------------------------------------------------------ Invalid input(s): POCBNP  ---------------------------------------------------------------------------------------------------------------  Urinalysis    Component Value Date/Time   COLORURINE YELLOW (A) 08/26/2014 1620   APPEARANCEUR Clear 07/08/2015 0918   LABSPEC 1.010 08/26/2014 1620   LABSPEC 1.032 09/02/2013 2232   PHURINE 6.0 08/26/2014 1620   GLUCOSEU Negative 07/08/2015 0918   GLUCOSEU Negative 09/02/2013 2232   HGBUR NEGATIVE 08/26/2014 1620   BILIRUBINUR Negative 07/08/2015 0918    BILIRUBINUR Negative 09/02/2013 2232   KETONESUR NEGATIVE 08/26/2014 1620   PROTEINUR Negative 07/08/2015 0918   PROTEINUR NEGATIVE 08/26/2014 1620   NITRITE Negative 07/08/2015 0918   NITRITE NEGATIVE 08/26/2014 1620   LEUKOCYTESUR Negative 07/08/2015 0918   LEUKOCYTESUR 1+ 09/02/2013 2232     RADIOLOGY: Ct Head Wo Contrast  Result Date: 09/06/2016 CLINICAL DATA:  Left arm weakness and numbness beginning last night. EXAM: CT HEAD WITHOUT CONTRAST TECHNIQUE: Contiguous axial images were obtained from the base of the skull through the vertex without intravenous contrast. COMPARISON:  None. FINDINGS: Brain: Appears normal without hemorrhage, infarct, mass lesion, mass effect, midline shift or abnormal extra-axial fluid collection. No hydrocephalus or pneumocephalus. Vascular: Negative. Skull: Intact. Sinuses/Orbits: Negative. Other: None. IMPRESSION: Normal head CT. Electronically Signed   By: Drusilla Kanner M.D.   On: 09/06/2016 16:38   Mr Laqueta Jean AO Contrast  Result Date: 09/06/2016 CLINICAL DATA:  Left upper extremity numbness. Left-sided headache. Recent sinusitis. EXAM: MRI HEAD WITHOUT AND WITH CONTRAST TECHNIQUE: Multiplanar, multiecho pulse sequences of the brain and surrounding structures were obtained without and with intravenous contrast. CONTRAST:  20mL MULTIHANCE GADOBENATE DIMEGLUMINE 529 MG/ML IV SOLN COMPARISON:  Head CT from earlier today FINDINGS: Brain: There is roughly linear patchy restricted diffusion in the posterior right frontal lobe, cortex and white matter, consistent with acute infarct. There is a small area of cortical based gliosis in the left parietal lobe, with subtle low-density appearance on previous head CT. No acute hemorrhage, hydrocephalus, or mass. Vascular: Major flow voids are preserved. 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 Patent major dural venous sinuses. Skull and upper cervical spine: Negative for marrow lesion.  Sinuses/Orbits: Intermittently and variably limited by  artifact from braces. Limited, mild mucosal thickening in the upper maxillary sinuses. No acute sinusitis suspected. IMPRESSION: 1. Small cluster of subcentimeter acute infarcts in the posterior right frontal lobe. 2. Small area of left parietal cortex gliosis, possibly remote infarct. 3. History of recent sinusitis and clinical concern for complicated sinusitis. No active sinusitis seen today. Electronically Signed   By: Marnee Spring M.D.   On: 09/06/2016 18:55    EKG: Orders placed or performed during the hospital encounter of 09/06/16  . ED EKG  . ED EKG    IMPRESSION AND PLAN:  * Acute stroke   Echocardiogram and carotid Doppler studies.   Check lipid panel and hemoglobin A1c the   Give aspirin and atorvastatin.   Neurologic consult and physical therapy evaluation.   Patient came more than 24 hours after starting the symptoms so she is out of the window for TPA.  * Active smoking   Counseled her about increased risk of thromboembolic phenomena and coronary artery disease while using hormonal contraceptive therapy and smoking.  Counseled to quit smoking for 4 minutes and offered nicotine patch.   All the records are reviewed and case discussed with ED provider. Management plans discussed with the patient, family and they are in agreement.  CODE STATUS:Full code  Code Status History    This patient does not have a recorded code status. Please follow your organizational policy for patients in this situation.       TOTAL TIME TAKING CARE OF THIS PATIENT: 50  minutes.    Altamese Dilling M.D on 09/06/2016   Between 7am to 6pm - Pager - [phone]  After 6pm go to [email or web address] - password [email or web address]  [facility] [place]  Office  [phone]  CC: Primary care physician; Dorcas Carrow, DO   Note: This dictation was prepared with Dragon dictation along with smaller phrase technology. Any  transcriptional errors that result from this process are unintentional.

## 2016-09-07 ENCOUNTER — Inpatient Hospital Stay (HOSPITAL_COMMUNITY): Payer: Medicaid Other

## 2016-09-07 ENCOUNTER — Inpatient Hospital Stay (HOSPITAL_COMMUNITY)
Admit: 2016-09-07 | Discharge: 2016-09-07 | Disposition: A | Discharge status: Home / Self Care | Payer: Medicaid Other | Attending: Internal Medicine | Admitting: Internal Medicine

## 2016-09-07 ENCOUNTER — Inpatient Hospital Stay: Payer: Medicaid Other

## 2016-09-07 DIAGNOSIS — I6350 Cerebral infarction due to unspecified occlusion or stenosis of unspecified cerebral artery: Secondary | ICD-10-CM

## 2016-09-07 LAB — LIPID PANEL
CHOL/HDL RATIO: 5.1 ratio
Cholesterol: 179 mg/dL (ref 0–200)
HDL: 35 mg/dL — ABNORMAL LOW (ref >40)
LDL Cholesterol: 133 mg/dL — ABNORMAL HIGH (ref 0–99)
Triglycerides: 57 mg/dL (ref <150)
VLDL: 11 mg/dL (ref 0–40)

## 2016-09-07 LAB — CBC
HCT: 36.3 % (ref 35.0–47.0)
Hemoglobin: 12.1 g/dL (ref 12.0–16.0)
MCH: 27.9 pg (ref 26.0–34.0)
MCHC: 33.4 g/dL (ref 32.0–36.0)
MCV: 83.5 fL (ref 80.0–100.0)
PLATELETS: 338 10*3/uL (ref 150–440)
RBC: 4.35 MIL/uL (ref 3.80–5.20)
RDW: 14.6 % — AB (ref 11.5–14.5)
WBC: 7.4 10*3/uL (ref 3.6–11.0)

## 2016-09-07 LAB — BASIC METABOLIC PANEL
Anion gap: 7 (ref 5–15)
BUN: 11 mg/dL (ref 6–20)
CALCIUM: 8.9 mg/dL (ref 8.9–10.3)
CO2: 21 mmol/L — ABNORMAL LOW (ref 22–32)
CREATININE: 0.76 mg/dL (ref 0.44–1.00)
Chloride: 111 mmol/L (ref 101–111)
Glucose, Bld: 151 mg/dL — ABNORMAL HIGH (ref 65–99)
Potassium: 3.9 mmol/L (ref 3.5–5.1)
SODIUM: 139 mmol/L (ref 135–145)

## 2016-09-07 LAB — ECHOCARDIOGRAM COMPLETE
HEIGHTINCHES: 66 in
Weight: 3424 oz

## 2016-09-07 LAB — ANTITHROMBIN III: ANTITHROMB III FUNC: 95 % (ref 75–120)

## 2016-09-07 LAB — C-REACTIVE PROTEIN: CRP: 1 mg/dL — ABNORMAL HIGH (ref <1.0)

## 2016-09-07 LAB — SEDIMENTATION RATE: SED RATE: 41 mm/h — AB (ref 0–20)

## 2016-09-07 NOTE — Progress Notes (Signed)
SLP Cancellation Note  Patient Details Name: Margaret [name] MRN: [id number] DOB: 12/02/1970   Cancelled treatment:       Reason Eval/Treat Not Completed: SLP screened, no needs identified, will sign off (chart reviewed; consulted pt then NSG re: status).  Pt denied any difficulty swallowing and is currently on a regular diet; tolerates swallowing pills w/ water per NSG. Pt conversed at conversational level w/out deficits noted; pt denied any speech-language deficits.  No further skilled ST services indicated as pt appears at her baseline. Pt agreed. NSG to reconsult if any change in status.    Jerilynn SomKatherine Ailie Gage, MS, CCC-SLP Margaret [name] 09/07/2016, 11:29 AM

## 2016-09-07 NOTE — Evaluation (Signed)
Occupational Therapy Evaluation Patient Details Name: Margaret [name] MRN: [id number] DOB: July 12, 1970 Today's Date: 09/07/2016    History of Present Illness Pt admitted for complaints of numbness x 1 month along with recent complaints of weakness and cognitive deficits. 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    Clinical Impression   Pt is 46 year old female who presents with new onset of R frontal lobe CVA (see above for presenting problems). She was independent and living in an apartment with her 61 year old daughter.  She has a 56 year old daughter as well but she has 4 children of her own and pt states is not able to help her.  Pt presents with a flat affect, lethargy with a slight delay in responses but cooperative. She had difficulty keeping her eyes open lying in bed and sitting at EOB even with cues. She was able to respond to all questions verbally which were appropriate and open eyes intermittently.  Vision screen was normal.  Pt required min assist for ADLs mainly due to decreased function of L non dominant hand which has numbness and tingling with decreased light touch.  Proprioception intact. Discussed concerns with lethargy with NSG and SW since no other providers indicate this and rec monitoring closely with NSG to ensure this is not neurological extension of stroke. At this time, rec OT HH since patient is not safe to drive herself to outpatient services.  Discussed with fellow PT since primary PT is no longer working today but is back tomorrow.   Rec continued OT while in hospital to continue to work on increasing independence in ADLs, balance and functional mobility training, coordination exercises and family ed and training and [facility] OT after discharge.    Follow Up Recommendations  Home health OT    Equipment Recommendations  Tub/shower seat    Recommendations for Other Services       Precautions / Restrictions  Precautions Precautions: Fall Restrictions Weight Bearing Restrictions: No      Mobility Bed Mobility Overal bed mobility: Independent             General bed mobility comments: safe technique performed with no AD  Transfers Overall transfer level: Independent Equipment used: None             General transfer comment: safe technique performed, no AD    Balance Overall balance assessment: History of Falls                                         ADL either performed or assessed with clinical judgement   ADL Overall ADL's : Needs assistance/impaired Eating/Feeding: Set up;Minimal assistance Eating/Feeding Details (indicate cue type and reason): decreased use of L hand to assist with moving tray and use as an assist Grooming: Wash/dry hands;Wash/dry face;Oral care;Set up;Minimal assistance Grooming Details (indicate cue type and reason): cues to stay alert to complete task sitting at EOB with difficulty problem solving and poor awareness         Upper Body Dressing : Set up;Minimal assistance Upper Body Dressing Details (indicate cue type and reason): unable to complete fine motor tasks like buttoning but able to don pullover with min cues and extra time. Lower Body Dressing: Set up;Minimal assistance;Sit to/from stand Lower Body Dressing Details (indicate cue type and reason): decreased balance leaning foward and discussed rec  to bring leg up or cross for LB dressing to increase safety and prevent loss of balance.               General ADL Comments: Pt having difficulty with keeping eyes open even though she was able to verbalize answers to questions.  She stated she just wants to lay down and its easier to keep her eyes closed.  Concerned about pt driving again if she were to go to outpatient therapy and she stated she does not have anyone who can take her and she would have to do it.  Discussued concerns with Steward DroneBrenda from SW and attempted to call  PT Judeth CornfieldStephanie twice to discuss but no answer on ascom.      Vision Baseline Vision/History: No visual deficits Patient Visual Report: No change from baseline Vision Assessment?: No apparent visual deficits;Yes Eye Alignment: Within Functional Limits Ocular Range of Motion: Within Functional Limits Alignment/Gaze Preference: Chin down Tracking/Visual Pursuits: Able to track stimulus in all quads without difficulty Saccades: Within functional limits Convergence: Within functional limits Visual Fields: No apparent deficits     Perception     Praxis      Pertinent Vitals/Pain Pain Assessment: No/denies pain     Hand Dominance Right   Extremity/Trunk Assessment Upper Extremity Assessment Upper Extremity Assessment: LUE deficits/detail LUE Deficits / Details: L biceps grossly 4/5; grip 3+/5; triceps 4+/5; poor control of UE and hand and movements slow and weak LUE Sensation: decreased light touch LUE Coordination: decreased fine motor;decreased gross motor   Lower Extremity Assessment Lower Extremity Assessment: Defer to PT evaluation LLE Deficits / Details: L LE grossly 4/5 on hamstrings; 4+/5 quad LLE Sensation: decreased light touch (appeared to "jump" around; not consistent)       Communication Communication Communication: No difficulties   Cognition Arousal/Alertness: Lethargic Behavior During Therapy: Flat affect (difficulty staying alert and keeping eyes open even when sitting at EOB) Overall Cognitive Status: Within Functional Limits for tasks assessed                                 General Comments: Concerned that patient stated she would have to drive herslef and "figure it out" even though she could not keep her eyes open even when sitting at EOB.  Discussed with Steward DroneBrenda from SW since she is not safe to drive and rec HH instead of outpatient due to this concern.     General Comments       Exercises     Shoulder Instructions      Home Living  Family/patient expects to be discharged to:: Private residence Living Arrangements: Children (daughter age 109; 45 year old daughter has 3 children and does not live with her) Available Help at Discharge: Family Type of Home: Apartment Home Access: Stairs to enter Secretary/administratorntrance Stairs-Number of Steps: 1 Entrance Stairs-Rails: None Home Layout: One level     Bathroom Shower/Tub: IT trainerTub/shower unit;Curtain   Bathroom Toilet: Standard Bathroom Accessibility: No   Home Equipment: None          Prior Functioning/Environment Level of Independence: Independent        Comments: working and driving. Very indep per patient and taking care of her 45 year old daughter.  45 year old daughter lives close  by but has 4 children of her own.        OT Problem List: Decreased strength;Decreased range of motion;Decreased activity tolerance;Decreased safety awareness;Impaired UE  functional use;Impaired balance (sitting and/or standing)      OT Treatment/Interventions: Self-care/ADL training;Therapeutic exercise;Balance training;Therapeutic activities;Neuromuscular education    OT Goals(Current goals can be found in the care plan section) Acute Rehab OT Goals Patient Stated Goal: to go home OT Goal Formulation: With patient Time For Goal Achievement: 09/21/16 Potential to Achieve Goals: Good ADL Goals Pt Will Perform Lower Body Dressing: with supervision;sit to/from stand (with no LOB and sitting EOB or in chair) Pt Will Transfer to Toilet: with supervision;regular height toilet;stand pivot transfer (with no LOB and no AD) Pt/caregiver will Perform Home Exercise Program: Left upper extremity;With theraputty;With theraband;With written HEP provided  OT Frequency: Min 1X/week   Barriers to D/C:    patient states she lives at home with her 55 year old daughter and does not have any help with transportation.       Co-evaluation              AM-PAC PT "6 Clicks" Daily Activity     Outcome  Measure Help from another person eating meals?: A Little Help from another person taking care of personal grooming?: A Little Help from another person toileting, which includes using toliet, bedpan, or urinal?: A Little Help from another person bathing (including washing, rinsing, drying)?: A Little Help from another person to put on and taking off regular upper body clothing?: A Little Help from another person to put on and taking off regular lower body clothing?: A Little 6 Click Score: 18   End of Session Nurse Communication: Other (comment) (discussed decreased ability to stay awake even when stiting EOB)  Activity Tolerance: Patient limited by lethargy Patient left: in bed;with call bell/phone within reach;with bed alarm set  OT Visit Diagnosis: Muscle weakness (generalized) (M62.81);Other symptoms and signs involving the nervous system (R29.898)                Time: 1330-1410 OT Time Calculation (min): 40 min Charges:  OT General Charges $OT Visit: 1 Procedure OT Evaluation $OT Eval Low Complexity: 1 Procedure OT Treatments $Self Care/Home Management : 23-37 mins G-Codes:     Susanne Borders, OTR/L ascom [phone] 09/07/16, 2:30 PM

## 2016-09-07 NOTE — Progress Notes (Signed)
[facility] [place] at [facility]   PATIENT NAME: Margaret [name]    MR#:  [id number]  DATE OF BIRTH:  04/25/1970  SUBJECTIVE:  CHIEF COMPLAINT:  Still complaining of left upper extremity numbness and tingling, intermittent episodes of speech difficulty which is better than yesterday, swallowing fine  REVIEW OF SYSTEMS:  CONSTITUTIONAL: No fever, fatigue or weakness.  EYES: No blurred or double vision.  EARS, NOSE, AND THROAT: No tinnitus or ear pain.  RESPIRATORY: No cough, shortness of breath, wheezing or hemoptysis.  CARDIOVASCULAR: No chest pain, orthopnea, edema.  GASTROINTESTINAL: No nausea, vomiting, diarrhea or abdominal pain.  GENITOURINARY: No dysuria, hematuria.  ENDOCRINE: No polyuria, nocturia,  HEMATOLOGY: No anemia, easy bruising or bleeding SKIN: No rash or lesion. MUSCULOSKELETAL: No joint pain or arthritis.   NEUROLOGIC:Left arm numbness and tingling No tingling, numbness, weakness.  PSYCHIATRY: No anxiety or depression.   DRUG ALLERGIES:  No Known Allergies  VITALS:  Blood pressure 113/78, pulse 85, temperature 98.8 F (37.1 C), temperature source Oral, resp. rate 20, height 5\' 6"  (1.676 m), weight 97.1 kg (214 lb), SpO2 94 %.  PHYSICAL EXAMINATION:  GENERAL:  46 y.o.-year-old patient lying in the bed with no acute distress.  EYES: Pupils equal, round, reactive to light and accommodation. No scleral icterus. Extraocular muscles intact.  HEENT: Head atraumatic, normocephalic. Oropharynx and nasopharynx clear.  NECK:  Supple, no jugular venous distention. No thyroid enlargement, no tenderness.  LUNGS: Normal breath sounds bilaterally, no wheezing, rales,rhonchi or crepitation. No use of accessory muscles of respiration.  CARDIOVASCULAR: S1, S2 normal. No murmurs, rubs, or gallops.  ABDOMEN: Soft, nontender, nondistended. Bowel sounds present. No organomegaly or mass.  EXTREMITIES: No pedal edema, cyanosis, or clubbing.  NEUROLOGIC:  Cranial nerves II through XII are intact. Muscle strength 5/5 in all extremities. Sensation intact. Gait not checked.  PSYCHIATRIC: The patient is alert and oriented x 3.  SKIN: No obvious rash, lesion, or ulcer.    LABORATORY PANEL:   CBC  Recent Labs Lab 09/07/16 0442  WBC 7.4  HGB 12.1  HCT 36.3  PLT 338   ------------------------------------------------------------------------------------------------------------------  Chemistries   Recent Labs Lab 09/06/16 1618 09/07/16 0442  NA 139 139  K 2.9* 3.9  CL 108 111  CO2 25 21*  GLUCOSE 101* 151*  BUN 8 11  CREATININE 0.83 0.76  CALCIUM 8.7* 8.9  AST 23  --   ALT 13*  --   ALKPHOS 62  --   BILITOT 0.4  --    ------------------------------------------------------------------------------------------------------------------  Cardiac Enzymes  Recent Labs Lab 09/06/16 1618  TROPONINI <0.03   ------------------------------------------------------------------------------------------------------------------  RADIOLOGY:  Ct Head Wo Contrast  Result Date: 09/06/2016 CLINICAL DATA:  Left arm weakness and numbness beginning last night. EXAM: CT HEAD WITHOUT CONTRAST TECHNIQUE: Contiguous axial images were obtained from the base of the skull through the vertex without intravenous contrast. COMPARISON:  None. FINDINGS: Brain: Appears normal without hemorrhage, infarct, mass lesion, mass effect, midline shift or abnormal extra-axial fluid collection. No hydrocephalus or pneumocephalus. Vascular: Negative. Skull: Intact. Sinuses/Orbits: Negative. Other: None. IMPRESSION: Normal head CT. Electronically Signed   By: Drusilla Kannerhomas  Dalessio M.D.   On: 09/06/2016 16:38   Mr Laqueta JeanBrain W UUWo Contrast  Result Date: 09/06/2016 CLINICAL DATA:  Left upper extremity numbness. Left-sided headache. Recent sinusitis. EXAM: MRI HEAD WITHOUT AND WITH CONTRAST TECHNIQUE: Multiplanar, multiecho pulse sequences of the brain and surrounding structures were  obtained without and with intravenous contrast. CONTRAST:  20mL MULTIHANCE GADOBENATE DIMEGLUMINE  529 MG/ML IV SOLN COMPARISON:  Head CT from earlier today FINDINGS: Brain: There is roughly linear patchy restricted diffusion in the posterior right frontal lobe, cortex and white matter, consistent with acute infarct. There is a small area of cortical based gliosis in the left parietal lobe, with subtle low-density appearance on previous head CT. No acute hemorrhage, hydrocephalus, or mass. Vascular: Major flow voids are preserved. 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 Patent major dural venous sinuses. Skull and upper cervical spine: Negative for marrow lesion. Sinuses/Orbits: Intermittently and variably limited by artifact from braces. Limited, mild mucosal thickening in the upper maxillary sinuses. No acute sinusitis suspected. IMPRESSION: 1. Small cluster of subcentimeter acute infarcts in the posterior right frontal lobe. 2. Small area of left parietal cortex gliosis, possibly remote infarct. 3. History of recent sinusitis and clinical concern for complicated sinusitis. No active sinusitis seen today. Electronically Signed   By: Marnee Spring M.D.   On: 09/06/2016 18:55   US Carotid Bilateral  Result Date: 09/07/2016 CLINICAL DATA:  CVA EXAM: BILATERAL CAROTID DUPLEX ULTRASOUND TECHNIQUE: Wallace Cullens scale imaging, color Doppler and duplex ultrasound were performed of bilateral carotid and vertebral arteries in the neck. COMPARISON:  None. FINDINGS: Criteria: Quantification of carotid stenosis is based on velocity parameters that correlate the residual internal carotid diameter with NASCET-based stenosis levels, using the diameter of the distal internal carotid lumen as the denominator for stenosis measurement. The following velocity measurements were obtained: RIGHT ICA:  75 cm/sec CCA:  80 cm/sec SYSTOLIC ICA/CCA RATIO:  0.9 DIASTOLIC ICA/CCA RATIO:  1.5 ECA:  61 cm/sec LEFT  ICA:  57 cm/sec CCA:  75 cm/sec SYSTOLIC ICA/CCA RATIO:  0.8 DIASTOLIC ICA/CCA RATIO:  1.3 ECA:  86 cm/sec RIGHT CAROTID ARTERY: Little if any plaque in the bulb. Low resistance internal carotid Doppler pattern. RIGHT VERTEBRAL ARTERY:  Antegrade. LEFT CAROTID ARTERY: Little if any plaque in the bulb. Low resistance internal carotid Doppler pattern. LEFT VERTEBRAL ARTERY:  Antegrade. Multiple bilateral thyroid nodules are noted. The largest on left measures 2.2 x 1.8 x 1.9 cm. IMPRESSION: Less than 50% stenosis in the right and left internal carotid arteries. Multiple thyroid nodules are noted. The largest measures 2.2 cm. Dedicated thyroid ultrasound is recommended. Electronically Signed   By: Jolaine Click M.D.   On: 09/07/2016 10:27    EKG:   Orders placed or performed during the hospital encounter of 09/06/16  . ED EKG  . ED EKG  . EKG    ASSESSMENT AND PLAN:    * Acute stroke MRI brain with right frontal lobe infarct   Echocardiogram normal but neurologist has recommended TEE given her young age with acute stroke  TEE in a.m., nothing by mouth after midnight  carotid Doppler studies-no significant stenosis Coag workup ordered including protein C, protein S, antithrombin III, factor V Leiden   lipid panel-LDL 133 start high intensity statin  hemoglobin A1c     appreciate neurology recommendations    Patient came more than 24 hours after starting the symptoms so she is out of the window for TPA.  *Hyperlipidemia statin  *Multiple thyroid nodules outpatient follow-up with primary care physician for further workup  * Active smoking   Counseled her about increased risk of thromboembolic phenomena and coronary artery disease while using hormonal contraceptive therapy and smoking.  Counseled to quit smoking for 4 minutes and offered nicotine patch.     All the records are reviewed and case discussed with Care  Management/Social Workerr. Management plans discussed with the patient,  family and they are in agreement.  CODE STATUS:FC   TOTAL TIME TAKING CARE OF THIS PATIENT: 36 minutes.   POSSIBLE D/C IN 1-2DAYS, DEPENDING ON CLINICAL CONDITION.  Note: This dictation was prepared with Dragon dictation along with smaller phrase technology. Any transcriptional errors that result from this process are unintentional.   Ramonita Lab M.D on 09/07/2016 at 3:58 PM  Between 7am to 6pm - Pager - [phone] After 6pm go to [email or web address] - password [email or web address] [facility]  [facility]  Office  [phone]  CC: Primary care physician; Dorcas Carrow, DO

## 2016-09-07 NOTE — Progress Notes (Signed)
PT Cancellation Note  Patient Details Name: Charlesetta Garibaldionia Lynette Humann MRN: [id number] DOB: 09/10/1970   Cancelled Treatment:    Reason Eval/Treat Not Completed: Other (comment). Consult received and chart reviewed. Pt currently unavailable at this time for therapy, out of the room for testing. Will re-attempt, time permitting.   Allesandra Huebsch 09/07/2016, 9:24 AM  Elizabeth PalauStephanie Antanette Richwine, PT, DPT [phone]

## 2016-09-07 NOTE — Progress Notes (Signed)
*  PRELIMINARY RESULTS* Echocardiogram 2D Echocardiogram has been performed.  Cristela BlueHege, Cindy Brindisi 09/07/2016, 11:25 AM

## 2016-09-07 NOTE — Consult Note (Addendum)
Referring Physician: Gouru    Chief Complaint: Left sided numbness  HPI: Margaret [name] is an 46 y.o. female who reports that on Saturday night she began to have LUE paresthesias.  She went about her activities and later than evening went to bed.  When she awakened on Sunday she experienced left sided headache,with continued left sided numbness.  Patient presented for evaluation at that time.  Initial NIHSS of 1.  Date last known well: Date: 09/05/2016 Time last known well: Time: 23:30 tPA Given: No: Outside time window  Past Medical History:  Diagnosis Date  . Asthma   . Thyroid disease   Miscarriage in the past  Past Surgical History:  Procedure Laterality Date  . FRACTURE SURGERY      Family History  Problem Relation Age of Onset  . Stroke Father   Maternal grandfather with a stroke.  Daughter with multiple miscarriages   Social History:  reports that she has been smoking Cigarettes.  She has been smoking about 0.25 packs per day. She has never used smokeless tobacco. She reports that she drinks alcohol. She reports that she does not use drugs.  Allergies: No Known Allergies  Medications:  I have reviewed the patient's current medications. Prior to Admission:  Prescriptions Prior to Admission  Medication Sig Dispense Refill Last Dose  . guaiFENesin-codeine 100-10 MG/5ML syrup Take 5 mLs by mouth every 4 (four) hours as needed. 120 mL 0 prn at prn  . levothyroxine (SYNTHROID, LEVOTHROID) 137 MCG tablet Take 1 tablet (137 mcg total) by mouth daily before breakfast. 30 tablet 1 Past Week at am  . ondansetron (ZOFRAN ODT) 4 MG disintegrating tablet Take 1 tablet (4 mg total) by mouth every 8 (eight) hours as needed for nausea or vomiting. 20 tablet 0 prn at prn   Scheduled: . aspirin  325 mg Oral Daily  . atorvastatin  40 mg Oral q1800  . heparin  5,000 Units Subcutaneous Q8H  . levothyroxine  137 mcg Oral QAC breakfast  . potassium chloride  20 mEq Oral BID     ROS: History obtained from the patient  General ROS: negative for - chills, fatigue, fever, night sweats, weight gain or weight loss Psychological ROS: mood swings  Ophthalmic ROS: negative for - blurry vision, double vision, eye pain or loss of vision ENT ROS: negative for - epistaxis, nasal discharge, oral lesions, sore throat, tinnitus or vertigo Allergy and Immunology ROS: negative for - hives or itchy/watery eyes Hematological and Lymphatic ROS: negative for - bleeding problems, bruising or swollen lymph nodes Endocrine ROS: negative for - galactorrhea, hair pattern changes, polydipsia/polyuria or temperature intolerance Respiratory ROS: negative for - cough, hemoptysis, shortness of breath or wheezing Cardiovascular ROS: chest pain in the past Gastrointestinal ROS: negative for - abdominal pain, diarrhea, hematemesis, nausea/vomiting or stool incontinence Genito-Urinary ROS: negative for - dysuria, hematuria, incontinence or urinary frequency/urgency Musculoskeletal ROS: negative for - joint swelling or muscular weakness Neurological ROS: as noted in HPI Dermatological ROS: negative for rash and skin lesion changes  Physical Examination: Blood pressure 127/81, pulse 72, temperature 98.2 F (36.8 C), temperature source Oral, resp. rate 20, height '5\' 6"'$  (1.676 m), weight 97.1 kg (214 lb), SpO2 98 %.  HEENT-  Normocephalic, no lesions, without obvious abnormality.  Normal external eye and conjunctiva.  Normal TM's bilaterally.  Normal auditory canals and external ears. Normal external nose, mucus membranes and septum.  Normal pharynx. Cardiovascular- S1, S2 normal, pulses palpable throughout   Lungs- chest clear,  no wheezing, rales, normal symmetric air entry Abdomen- soft, non-tender; bowel sounds normal; no masses,  no organomegaly Extremities- no edema Lymph-no adenopathy palpable Musculoskeletal-no joint tenderness, deformity or swelling Skin-warm and dry, no  hyperpigmentation, vitiligo, or suspicious lesions  Neurological Examination   Mental Status: Alert, oriented, thought content appropriate.  Speech fluent without evidence of aphasia.  Able to follow 3 step commands without difficulty. Cranial Nerves: II: Discs flat bilaterally; Visual fields grossly normal, pupils equal, round, reactive to light and accommodation III,IV, VI: ptosis not present, extra-ocular motions intact bilaterally V,VII: smile symmetric, facial light touch sensation decreased on the left VIII: hearing normal bilaterally IX,X: gag reflex present XI: bilateral shoulder shrug XII: midline tongue extension Motor: Right : Upper extremity   5/5    Left:     Upper extremity   5-/5  Lower extremity   5/5     Lower extremity   5/5 Tone and bulk:normal tone throughout; no atrophy noted Sensory: Pinprick and light touch decreased on the LUE Deep Tendon Reflexes: 2+ in the upper extremities and trace in the lower extremities Plantars: Right: downgoing   Left: downgoing Cerebellar: Normal finger-to-nose and normal heel-to-shin testing bilaterally Gait: not tested due to safety concerns    Laboratory Studies:  Basic Metabolic Panel:  Recent Labs Lab 09/06/16 1618 09/07/16 0442  NA 139 139  K 2.9* 3.9  CL 108 111  CO2 25 21*  GLUCOSE 101* 151*  BUN 8 11  CREATININE 0.83 0.76  CALCIUM 8.7* 8.9    Liver Function Tests:  Recent Labs Lab 09/06/16 1618  AST 23  ALT 13*  ALKPHOS 62  BILITOT 0.4  PROT 7.2  ALBUMIN 3.6   No results for input(s): LIPASE, AMYLASE in the last 168 hours. No results for input(s): AMMONIA in the last 168 hours.  CBC:  Recent Labs Lab 09/06/16 1618 09/07/16 0442  WBC 13.0* 7.4  NEUTROABS 7.7*  --   HGB 12.1 12.1  HCT 36.3 36.3  MCV 82.6 83.5  PLT 333 338    Cardiac Enzymes:  Recent Labs Lab 09/06/16 1618  TROPONINI <0.03    BNP: Invalid input(s): POCBNP  CBG:  Recent Labs Lab 09/06/16 1651  GLUCAP 96     Microbiology: Results for orders placed or performed in visit on 05/14/16  Veritor Flu A/B Waived     Status: None   Collection Time: 05/14/16 10:56 AM  Result Value Ref Range Status   Influenza A Negative Negative Final   Influenza B Negative Negative Final    Comment: If the test is negative for the presence of influenza A or influenza B antigen, infection due to influenza cannot be ruled-out because the antigen present in the sample may be below the detection limit of the test. It is recommended that these results be confirmed by viral culture or an FDA-cleared influenza A and B molecular assay.     Coagulation Studies:  Recent Labs  09/06/16 1618  LABPROT 14.6  INR 1.13    Urinalysis: No results for input(s): COLORURINE, LABSPEC, PHURINE, GLUCOSEU, HGBUR, BILIRUBINUR, KETONESUR, PROTEINUR, UROBILINOGEN, NITRITE, LEUKOCYTESUR in the last 168 hours.  Invalid input(s): APPERANCEUR  Lipid Panel:    Component Value Date/Time   CHOL 179 09/07/2016 0442   CHOL 195 12/20/2014 0903   CHOL 140 09/04/2013 0405   TRIG 57 09/07/2016 0442   TRIG 137 09/04/2013 0405   HDL 35 (L) 09/07/2016 0442   HDL 41 12/20/2014 0903   HDL 30 (L) 09/04/2013  0405   CHOLHDL 5.1 09/07/2016 0442   VLDL 11 09/07/2016 0442   VLDL 27 09/04/2013 0405   LDLCALC 133 (H) 09/07/2016 0442   LDLCALC 120 (H) 12/20/2014 0903   LDLCALC 83 09/04/2013 0405    HgbA1C: No results found for: HGBA1C  Urine Drug Screen:  No results found for: LABOPIA, COCAINSCRNUR, LABBENZ, AMPHETMU, THCU, LABBARB  Alcohol Level: No results for input(s): ETH in the last 168 hours.  Other results: EKG: sinus rhythm at 78 bpm.  Imaging: Ct Head Wo Contrast  Result Date: 09/06/2016 CLINICAL DATA:  Left arm weakness and numbness beginning last night. EXAM: CT HEAD WITHOUT CONTRAST TECHNIQUE: Contiguous axial images were obtained from the base of the skull through the vertex without intravenous contrast. COMPARISON:  None.  FINDINGS: Brain: Appears normal without hemorrhage, infarct, mass lesion, mass effect, midline shift or abnormal extra-axial fluid collection. No hydrocephalus or pneumocephalus. Vascular: Negative. Skull: Intact. Sinuses/Orbits: Negative. Other: None. IMPRESSION: Normal head CT. Electronically Signed   By: Inge Rise M.D.   On: 09/06/2016 16:38   Mr Jeri Cos LD Contrast  Result Date: 09/06/2016 CLINICAL DATA:  Left upper extremity numbness. Left-sided headache. Recent sinusitis. EXAM: MRI HEAD WITHOUT AND WITH CONTRAST TECHNIQUE: Multiplanar, multiecho pulse sequences of the brain and surrounding structures were obtained without and with intravenous contrast. CONTRAST:  27m MULTIHANCE GADOBENATE DIMEGLUMINE 529 MG/ML IV SOLN COMPARISON:  Head CT from earlier today FINDINGS: Brain: There is roughly linear patchy restricted diffusion in the posterior right frontal lobe, cortex and white matter, consistent with acute infarct. There is a small area of cortical based gliosis in the left parietal lobe, with subtle low-density appearance on previous head CT. No acute hemorrhage, hydrocephalus, or mass. Vascular: Major flow voids are preserved. 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 Patent major dural venous sinuses. Skull and upper cervical spine: Negative for marrow lesion. Sinuses/Orbits: Intermittently and variably limited by artifact from braces. Limited, mild mucosal thickening in the upper maxillary sinuses. No acute sinusitis suspected. IMPRESSION: 1. Small cluster of subcentimeter acute infarcts in the posterior right frontal lobe. 2. Small area of left parietal cortex gliosis, possibly remote infarct. 3. History of recent sinusitis and clinical concern for complicated sinusitis. No active sinusitis seen today. Electronically Signed   By: JMonte FantasiaM.D.   On: 09/06/2016 18:55   UKoreaCarotid Bilateral  Result Date: 09/07/2016 CLINICAL DATA:  CVA EXAM: BILATERAL  CAROTID DUPLEX ULTRASOUND TECHNIQUE: GPearline Cablesscale imaging, color Doppler and duplex ultrasound were performed of bilateral carotid and vertebral arteries in the neck. COMPARISON:  None. FINDINGS: Criteria: Quantification of carotid stenosis is based on velocity parameters that correlate the residual internal carotid diameter with NASCET-based stenosis levels, using the diameter of the distal internal carotid lumen as the denominator for stenosis measurement. The following velocity measurements were obtained: RIGHT ICA:  75 cm/sec CCA:  80 cm/sec SYSTOLIC ICA/CCA RATIO:  0.9 DIASTOLIC ICA/CCA RATIO:  1.5 ECA:  61 cm/sec LEFT ICA:  57 cm/sec CCA:  75 cm/sec SYSTOLIC ICA/CCA RATIO:  0.8 DIASTOLIC ICA/CCA RATIO:  1.3 ECA:  86 cm/sec RIGHT CAROTID ARTERY: Little if any plaque in the bulb. Low resistance internal carotid Doppler pattern. RIGHT VERTEBRAL ARTERY:  Antegrade. LEFT CAROTID ARTERY: Little if any plaque in the bulb. Low resistance internal carotid Doppler pattern. LEFT VERTEBRAL ARTERY:  Antegrade. Multiple bilateral thyroid nodules are noted. The largest on left measures 2.2 x 1.8 x 1.9 cm. IMPRESSION: Less than 50% stenosis in  the right and left internal carotid arteries. Multiple thyroid nodules are noted. The largest measures 2.2 cm. Dedicated thyroid ultrasound is recommended. Electronically Signed   By: Marybelle Killings M.D.   On: 09/07/2016 10:27    Assessment: 46 y.o. female presenting with headache and left sided numbness.  MRI reviewed and shows small acute infarcts in the right frontal lobe.  Suspect embolic etiology.  Patient young.  On no antiplatelet therapy at home.  Carotid dopplers show no evidence of hemodynamically significant stenosis.  Echocardiogram pending.  A1c pending, LDL 133.  UDS not performed.  Further work up recommended.    Stroke Risk Factors - smoking  Plan: 1. HgbA1c, fasting lipid panel, ESR, CRP, ATIII, lupus anticoagulant, factor V, anticardiolipin antibody, homocysteine,  protein S, protein C 2. PT consult, OT consult, Speech consult 3. Prophylactic therapy-Antiplatelet med: Aspirin - dose 329m daily 4. Telemetry monitoring 5. Frequent neuro checks 6. Patient will likely need TEE but will await results of TTE 7. Statin for lipid management with target LDL<70. 8. Smoking cessation counseling    LAlexis Goodell MD Neurology [phone]/07/2016, 10:56 AM

## 2016-09-07 NOTE — Evaluation (Signed)
Physical Therapy Evaluation Patient Details Name: Margaret [name] MRN: [id number] DOB: Aug 09, 1970 Today's Date: 09/07/2016   History of Present Illness  Pt admitted for complaints of numbness x 1 month along with recent complaints of weakness and cognitive deficits. 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   Clinical Impression  Pt is a pleasant 46 year old female who was admitted for R frontal CVA. Pt performs bed mobility/transfers with independence and ambulation with supervision and no AD. Coordination intact for UE/LE. Pt demonstrates deficits with strength/balance/mobility. Would benefit from skilled PT to address above deficits and promote optimal return to PLOF. Would benefit from OP PT for further treatment for L UE/LE deficits affecting functional independence.      Follow Up Recommendations Outpatient PT    Equipment Recommendations  None recommended by PT    Recommendations for Other Services       Precautions / Restrictions Precautions Precautions: Fall Restrictions Weight Bearing Restrictions: No      Mobility  Bed Mobility Overal bed mobility: Independent             General bed mobility comments: safe technique performed with no AD  Transfers Overall transfer level: Independent Equipment used: None             General transfer comment: safe technique performed, no AD  Ambulation/Gait Ambulation/Gait assistance: Supervision Ambulation Distance (Feet): 40 Feet Assistive device: None Gait Pattern/deviations: Step-through pattern;Decreased step length - left (decreased L arm swing)     General Gait Details: Good gait speed with no LOB, no AD. Follow commands well. Distance limited secondary to breakfast tray in room, pt wishing to sit to eat breakfast.  Stairs            Wheelchair Mobility    Modified Rankin (Stroke Patients Only)       Balance Overall balance assessment: History of  Falls                                           Pertinent Vitals/Pain Pain Assessment: No/denies pain    Home Living Family/patient expects to be discharged to:: Private residence Living Arrangements: Children (daughter age 78) Available Help at Discharge: Family Type of Home: House Home Access: Stairs to enter Entrance Stairs-Rails: None Entrance Stairs-Number of Steps: 1 Home Layout: One level Home Equipment: None      Prior Function Level of Independence: Independent         Comments: working and driving. Very indep per patient     Hand Dominance        Extremity/Trunk Assessment   Upper Extremity Assessment Upper Extremity Assessment: LUE deficits/detail (R UE grossly 5/5) LUE Deficits / Details: L biceps grossly 4/5; grip 3+/5; triceps 4+/5 LUE Sensation: decreased light touch    Lower Extremity Assessment Lower Extremity Assessment: LLE deficits/detail (R LE grossly 5/5) LLE Deficits / Details: L LE grossly 4/5 on hamstrings; 4+/5 quad LLE Sensation: decreased light touch (appeared to "jump" around; not consistent)       Communication   Communication: No difficulties  Cognition Arousal/Alertness: Awake/alert Behavior During Therapy: WFL for tasks assessed/performed Overall Cognitive Status: Within Functional Limits for tasks assessed  General Comments      Exercises     Assessment/Plan    PT Assessment Patient needs continued PT services  PT Problem List Decreased strength;Decreased activity tolerance;Decreased balance;Decreased mobility       PT Treatment Interventions Therapeutic exercise;Balance training;Neuromuscular re-education;Cognitive remediation;Gait training    PT Goals (Current goals can be found in the Care Plan section)  Acute Rehab PT Goals Patient Stated Goal: to go home PT Goal Formulation: With patient Time For Goal Achievement: 09/21/16 Potential  to Achieve Goals: Good    Frequency 7X/week   Barriers to discharge        Co-evaluation               AM-PAC PT "6 Clicks" Daily Activity  Outcome Measure Difficulty turning over in bed (including adjusting bedclothes, sheets and blankets)?: None Difficulty moving from lying on back to sitting on the side of the bed? : None Difficulty sitting down on and standing up from a chair with arms (e.g., wheelchair, bedside commode, etc,.)?: None Help needed moving to and from a bed to chair (including a wheelchair)?: None Help needed walking in hospital room?: None Help needed climbing 3-5 steps with a railing? : A Little 6 Click Score: 23    End of Session Equipment Utilized During Treatment: Gait belt Activity Tolerance: Patient tolerated treatment well Patient left: in bed Nurse Communication: Mobility status PT Visit Diagnosis: History of falling (Z91.81);Difficulty in walking, not elsewhere classified (R26.2)    Time: [id number] PT Time Calculation (min) (ACUTE ONLY): 16 min   Charges:   PT Evaluation $PT Eval Low Complexity: 1 Procedure     PT G Codes:        Elizabeth PalauStephanie Jennaya Pogue, PT, DPT [phone]   Orvie Caradine 09/07/2016, 11:08 AM

## 2016-09-08 ENCOUNTER — Inpatient Hospital Stay: Payer: Medicaid Other

## 2016-09-08 ENCOUNTER — Inpatient Hospital Stay (HOSPITAL_COMMUNITY)
Admit: 2016-09-08 | Discharge: 2016-09-08 | Disposition: A | Discharge status: Home / Self Care | Payer: Medicaid Other | Attending: Physician Assistant | Admitting: Physician Assistant

## 2016-09-08 ENCOUNTER — Encounter
Admission: EM | Disposition: A | Discharge status: Home / Self Care | Payer: Self-pay | Source: Home / Self Care | Attending: Internal Medicine

## 2016-09-08 DIAGNOSIS — I639 Cerebral infarction, unspecified: Principal | ICD-10-CM

## 2016-09-08 DIAGNOSIS — I340 Nonrheumatic mitral (valve) insufficiency: Secondary | ICD-10-CM

## 2016-09-08 HISTORY — PX: TEE WITHOUT CARDIOVERSION: SHX5443

## 2016-09-08 LAB — HEMOGLOBIN A1C
HEMOGLOBIN A1C: 6 % — AB (ref 4.8–5.6)
Hgb A1c MFr Bld: 5.9 % — ABNORMAL HIGH (ref 4.8–5.6)
MEAN PLASMA GLUCOSE: 126 mg/dL
Mean Plasma Glucose: 123 mg/dL

## 2016-09-08 LAB — PROTEIN S, TOTAL: Protein S Ag, Total: 85 % (ref 60–150)

## 2016-09-08 LAB — HOMOCYSTEINE: Homocysteine: 7.6 umol/L (ref 0.0–15.0)

## 2016-09-08 LAB — TSH: TSH: 8.002 u[IU]/mL — ABNORMAL HIGH (ref 0.350–4.500)

## 2016-09-08 LAB — CARDIOLIPIN ANTIBODIES, IGG, IGM, IGA
Anticardiolipin IgA: <9 APL U/mL (ref 0–11)
Anticardiolipin IgM: <9 MPL U/mL (ref 0–12)

## 2016-09-08 LAB — PROTEIN C, TOTAL: PROTEIN C, TOTAL: 88 % (ref 60–150)

## 2016-09-08 LAB — HIV ANTIBODY (ROUTINE TESTING W REFLEX): HIV SCREEN 4TH GENERATION: NONREACTIVE

## 2016-09-08 SURGERY — ECHOCARDIOGRAM, TRANSESOPHAGEAL
Anesthesia: Moderate Sedation

## 2016-09-08 MED ORDER — BUTAMBEN-TETRACAINE-BENZOCAINE 2-2-14 % EX AERO
INHALATION_SPRAY | CUTANEOUS | Status: AC
  Filled 2016-09-08: qty 20

## 2016-09-08 MED ORDER — SODIUM CHLORIDE 0.9 % IV SOLN: INTRAVENOUS | Status: DC

## 2016-09-08 MED ORDER — SODIUM CHLORIDE FLUSH 0.9 % IV SOLN
INTRAVENOUS | Status: AC
  Filled 2016-09-08: qty 10

## 2016-09-08 MED ORDER — MIDAZOLAM HCL 5 MG/5ML IJ SOLN
INTRAMUSCULAR | Status: AC
  Filled 2016-09-08: qty 10

## 2016-09-08 MED ORDER — FENTANYL CITRATE (PF) 100 MCG/2ML IJ SOLN
INTRAMUSCULAR | Status: AC
  Filled 2016-09-08: qty 4

## 2016-09-08 MED ORDER — ASPIRIN EC 325 MG PO TBEC: 325.0000 mg | DELAYED_RELEASE_TABLET | Freq: Every day | ORAL | 0 refills | Status: DC

## 2016-09-08 MED ORDER — LORAZEPAM 2 MG/ML IJ SOLN
INTRAMUSCULAR | Status: AC | PRN
  Administered 2016-09-08: 1 mg via INTRAVENOUS

## 2016-09-08 MED ORDER — FENTANYL CITRATE (PF) 100 MCG/2ML IJ SOLN
INTRAMUSCULAR | Status: AC | PRN
  Administered 2016-09-08 (×2): 25 ug via INTRAVENOUS

## 2016-09-08 MED ORDER — ATORVASTATIN CALCIUM 40 MG PO TABS: 40.0000 mg | ORAL_TABLET | Freq: Every day | ORAL | 0 refills | Status: DC

## 2016-09-08 MED ORDER — LIDOCAINE VISCOUS 2 % MT SOLN
OROMUCOSAL | Status: AC
  Filled 2016-09-08: qty 15

## 2016-09-08 MED ORDER — MIDAZOLAM HCL 2 MG/2ML IJ SOLN
INTRAMUSCULAR | Status: AC | PRN
  Administered 2016-09-08: 2 mg via INTRAVENOUS
  Administered 2016-09-08: 1 mg via INTRAVENOUS

## 2016-09-08 MED ORDER — ASPIRIN EC 325 MG PO TBEC: 325.0000 mg | DELAYED_RELEASE_TABLET | Freq: Every day | ORAL | 3 refills | Status: DC

## 2016-09-08 NOTE — Progress Notes (Signed)
Physical Therapy Treatment Patient Details Name: Margaret [name] MRN: [id number] DOB: 02/09/1971 Today's Date: 09/08/2016    History of Present Illness Pt admitted for complaints of numbness x 1 month along with recent complaints of weakness and cognitive deficits. 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     PT Comments    Pt agreeable to PT; denies pain other than sore throat from test this morning. Pt demonstrates independence with bed mobility, transfers and short ambulation within the room. Pt offered further ambulation/stairs, but refuses at this time due to fatigue. Pt willing to participate in stand exercises for strength/balance with single upper extremity support. Pt receives phone call and after call expresses frustration with not understanding what is happening; pt states MD was on the phone. Pt now has questions regarding what MD was saying. Nurse made aware and will assist pt in speaking with MD personally as wished. Pt wishes no further PT at this time. Continue PT to progress higher level balance/strength activities to prevent risk of falls and provide optimal function to return home.    Follow Up Recommendations  Outpatient PT     Equipment Recommendations  None recommended by PT    Recommendations for Other Services       Precautions / Restrictions Precautions Precautions: Fall Restrictions Weight Bearing Restrictions: No    Mobility  Bed Mobility Overal bed mobility: Independent                Transfers Overall transfer level: Independent                  Ambulation/Gait Ambulation/Gait assistance: Independent   Assistive device: None       General Gait Details: Ambulates short distances in room without assist, LOB or any subjective or noted difficulty   Stairs            Wheelchair Mobility    Modified Rankin (Stroke Patients Only)       Balance Overall balance assessment:  History of Falls                                          Cognition Arousal/Alertness: Awake/alert Behavior During Therapy: WFL for tasks assessed/performed Overall Cognitive Status: Within Functional Limits for tasks assessed                                        Exercises General Exercises - Lower Extremity Hip ABduction/ADduction: Strengthening;Both;10 reps;Standing Straight Leg Raises: Strengthening;Both;10 reps;Standing Hip Flexion/Marching: Strengthening;Both;20 reps Heel Raises: Strengthening;10 reps;Standing    General Comments        Pertinent Vitals/Pain Pain Assessment: No/denies pain (except sore throat from earlier test)    Home Living                      Prior Function            PT Goals (current goals can now be found in the care plan section) Progress towards PT goals: Progressing toward goals    Frequency    7X/week      PT Plan Current plan remains appropriate    Co-evaluation              AM-PAC PT "6 Clicks" Daily Activity  Outcome Measure  Difficulty turning over in bed (including adjusting bedclothes, sheets and blankets)?: None Difficulty moving from lying on back to sitting on the side of the bed? : None Difficulty sitting down on and standing up from a chair with arms (e.g., wheelchair, bedside commode, etc,.)?: None Help needed moving to and from a bed to chair (including a wheelchair)?: None Help needed walking in hospital room?: None Help needed climbing 3-5 steps with a railing? : A Little 6 Click Score: 23    End of Session   Activity Tolerance: Patient tolerated treatment well;Other (comment) (limited due to frustration after phone call) Patient left: Other (comment) (sitting in bed) Nurse Communication: Other (comment) (pt's frustration and wish for seeing MD for clarification) PT Visit Diagnosis: History of falling (Z91.81);Difficulty in walking, not elsewhere classified  (R26.2)     Time: 1610-9604 PT Time Calculation (min) (ACUTE ONLY): 19 min  Charges:  $Therapeutic Exercise: 8-22 mins                    G Codes:        Scot Dock, PTA 09/08/2016, 4:08 PM

## 2016-09-08 NOTE — CV Procedure (Signed)
    Transesophageal Echocardiogram Note  Margaret [name] [id number] 10/06/1970  Procedure: Transesophageal Echocardiogram Indications: Cryptogenic stroke  Procedure Details Consent: Obtained Time Out: Verified patient identification, verified procedure, site/side was marked, verified correct patient position, special equipment/implants available, Radiology Safety Procedures followed,  medications/allergies/relevent history reviewed, required imaging and test results available.  Performed  Medications:  During this procedure the patient is administered a total of Versed 4 mg and Fentanyl 50 mcg  to achieve and maintain moderate conscious sedation.  The patient's heart rate, blood pressure, and oxygen saturation are monitored continuously during the procedure. The period of conscious sedation is 20 minutes, of which I was present face-to-face 100% of this time.  Left Ventrical:  Normal size and function  Mitral Valve: Mild MR  Aortic Valve: Trivial AR.  Tricuspid Valve: Mild TR  Pulmonic Valve: Mild PR  Left Atrium/ Left atrial appendage: No thombus  Atrial septum: No PFO or ASD visualized, though small right-to-left shunt suspected based on bubble study.  Aorta: Normal   Complications: No apparent complications Patient did tolerate procedure well.  See full report under CV procedures for details.  Yvonne Kendallhristopher Tempestt Silba, MD 09/08/2016, 11:40 AM

## 2016-09-08 NOTE — Discharge Summary (Signed)
[facility] - [place] at [facility]   PATIENT NAME: Margaret [name]    MR#:  [id number]  DATE OF BIRTH:  06-27-1970  DATE OF ADMISSION:  09/06/2016 ADMITTING PHYSICIAN: Altamese Dilling, MD  DATE OF DISCHARGE: 09/08/16 PRIMARY CARE PHYSICIAN: Dorcas Carrow, DO    ADMISSION DIAGNOSIS:  Cerebral infarction (HCC) [I63.9] CVA (cerebral infarction) [I63.9] Left-sided headache [R51] Left upper extremity numbness [R20.0] Acute CVA (cerebrovascular accident) (HCC) [I63.9]  DISCHARGE DIAGNOSIS:  Principal Problem:   Stroke [facility])   SECONDARY DIAGNOSIS:   Past Medical History:  Diagnosis Date  . Asthma   . Thyroid disease     HOSPITAL COURSE:  HISTORY OF PRESENT ILLNESS: Margaret [name]  is a 46 y.o. female with a known history of Asthma, thyroid disease - started to have severe headache and left arm numbness and weakness since yesterday afternoon. Concerned with this she came to emergency room today initially ER physician thought it is a migraine as CT scan of the head was negative and give some pain medication. He also ordered a MRI on the brain and it reported positive for acute stroke so he suggested to admit to medical service for further management.  Acute stroke MRI brain with right frontal lobe infarct Echocardiogram normal but neurologist has recommended TEE given her young age with acute stroke  TEE -Evidence of very small atrial level right-to-left shunt by bubble   study without visualization of ASD or PFO. carotid Doppler studies-no significant stenosis Coag workup ordered including protein C, protein S, antithrombin III, factor V Leiden  lipid panel-LDL 133 start high intensity statin  hemoglobin A1c -5.9  appreciate neurology recommendations  Patient came more than 24 hours after starting the symptoms so she is out of the window for TPA. Physical therapy has recommended outpatient PT neurology has recommended alteration follow-up with  vascular neurology for further investigations  *Hyperlipidemia statin  *Multiple thyroid nodules outpatient follow-up with primary care physician for further workup. TSH is elevated at 8.002, endocrinology follow-up  * Active smoking Counseled her about increased risk of thromboembolic phenomena and coronary artery disease while using hormonal contraceptive therapy and smoking. Counseled to quit smoking for 4 minutes and offered nicotine patch. DISCHARGE CONDITIONS:   stable  CONSULTS OBTAINED:  Treatment Team:  Thana Farr, MD End, Cristal Deer, MD   PROCEDURES  TEE  DRUG ALLERGIES:  No Known Allergies  DISCHARGE MEDICATIONS:   Current Discharge Medication List    START taking these medications   Details  aspirin EC 325 MG tablet Take 1 tablet (325 mg total) by mouth daily. Qty: 60 tablet, Refills: 0    atorvastatin (LIPITOR) 40 MG tablet Take 1 tablet (40 mg total) by mouth daily at 6 PM. Qty: 30 tablet, Refills: 0      CONTINUE these medications which have NOT CHANGED   Details  guaiFENesin-codeine 100-10 MG/5ML syrup Take 5 mLs by mouth every 4 (four) hours as needed. Qty: 120 mL, Refills: 0    levothyroxine (SYNTHROID, LEVOTHROID) 137 MCG tablet Take 1 tablet (137 mcg total) by mouth daily before breakfast. Qty: 30 tablet, Refills: 1    ondansetron (ZOFRAN ODT) 4 MG disintegrating tablet Take 1 tablet (4 mg total) by mouth every 8 (eight) hours as needed for nausea or vomiting. Qty: 20 tablet, Refills: 0         DISCHARGE INSTRUCTIONS:   Follow-up with primary care physician in a week Follow-up with vascular neurology in 1-2 weeks Follow-up with endocrinology for thyroid  nodules in 2 weeks Outpatient physical therapy   DIET:  Cardiac diet  DISCHARGE CONDITION:  Stable  ACTIVITY:  Activity as tolerated  OXYGEN:  Home Oxygen: No.   Oxygen Delivery: room air  DISCHARGE LOCATION:  home   If you experience worsening of your  admission symptoms, develop shortness of breath, life threatening emergency, suicidal or homicidal thoughts you must seek medical attention immediately by calling 911 or calling your MD immediately  if symptoms less severe.  You Must read complete instructions/literature along with all the possible adverse reactions/side effects for all the Medicines you take and that have been prescribed to you. Take any new Medicines after you have completely understood and accpet all the possible adverse reactions/side effects.   Please note  You were cared for by a hospitalist during your hospital stay. If you have any questions about your discharge medications or the care you received while you were in the hospital after you are discharged, you can call the unit and asked to speak with the hospitalist on call if the hospitalist that took care of you is not available. Once you are discharged, your primary care physician will handle any further medical issues. Please note that NO REFILLS for any discharge medications will be authorized once you are discharged, as it is imperative that you return to your primary care physician (or establish a relationship with a primary care physician if you do not have one) for your aftercare needs so that they can reassess your need for medications and monitor your lab values.     Today  Chief Complaint  Patient presents with  . Numbness   Patient is doing fine. Denies any new complaints. Speech is improving. Tingling in upper extremity is better  ROS:  CONSTITUTIONAL: Denies fevers, chills. Denies any fatigue, weakness.  EYES: Denies blurry vision, double vision, eye pain. EARS, NOSE, THROAT: Denies tinnitus, ear pain, hearing loss. RESPIRATORY: Denies cough, wheeze, shortness of breath.  CARDIOVASCULAR: Denies chest pain, palpitations, edema.  GASTROINTESTINAL: Denies nausea, vomiting, diarrhea, abdominal pain. Denies bright red blood per rectum. GENITOURINARY: Denies  dysuria, hematuria. ENDOCRINE: Denies nocturia or thyroid problems. HEMATOLOGIC AND LYMPHATIC: Denies easy bruising or bleeding. SKIN: Denies rash or lesion. MUSCULOSKELETAL: Denies pain in neck, back, shoulder, knees, hips or arthritic symptoms.  NEUROLOGIC: Denies paralysis, paresthesias.  PSYCHIATRIC: Denies anxiety or depressive symptoms.   VITAL SIGNS:  Blood pressure (!) 142/98, pulse 78, temperature 98.4 F (36.9 C), temperature source Oral, resp. rate 18, height 5\' 6"  (1.676 m), weight 97.1 kg (214 lb), SpO2 98 %.  I/O:    Intake/Output Summary (Last 24 hours) at 09/08/16 1524 Last data filed at 09/08/16 1356  Gross per 24 hour  Intake              600 ml  Output                0 ml  Net              600 ml    PHYSICAL EXAMINATION:  GENERAL:  46 y.o.-year-old patient lying in the bed with no acute distress.  EYES: Pupils equal, round, reactive to light and accommodation. No scleral icterus. Extraocular muscles intact.  HEENT: Head atraumatic, normocephalic. Oropharynx and nasopharynx clear.  NECK:  Supple, no jugular venous distention. No thyroid enlargement, no tenderness.  LUNGS: Normal breath sounds bilaterally, no wheezing, rales,rhonchi or crepitation. No use of accessory muscles of respiration.  CARDIOVASCULAR: S1, S2 normal. No murmurs, rubs,  or gallops.  ABDOMEN: Soft, non-tender, non-distended. Bowel sounds present. No organomegaly or mass.  EXTREMITIES: No pedal edema, cyanosis, or clubbing.  NEUROLOGIC: Cranial nerves II through XII are intact. Muscle strength 5/5 in all extremities. Sensation intact. Gait not checked.  PSYCHIATRIC: The patient is alert and oriented x 3.  SKIN: No obvious rash, lesion, or ulcer.   DATA REVIEW:   CBC  Recent Labs Lab 09/07/16 0442  WBC 7.4  HGB 12.1  HCT 36.3  PLT 338    Chemistries   Recent Labs Lab 09/06/16 1618 09/07/16 0442  NA 139 139  K 2.9* 3.9  CL 108 111  CO2 25 21*  GLUCOSE 101* 151*  BUN 8 11   CREATININE 0.83 0.76  CALCIUM 8.7* 8.9  AST 23  --   ALT 13*  --   ALKPHOS 62  --   BILITOT 0.4  --     Cardiac Enzymes  Recent Labs Lab 09/06/16 1618  TROPONINI <0.03    Microbiology Results  Results for orders placed or performed in visit on 05/14/16  Veritor Flu A/B Waived     Status: None   Collection Time: 05/14/16 10:56 AM  Result Value Ref Range Status   Influenza A Negative Negative Final   Influenza B Negative Negative Final    Comment: If the test is negative for the presence of influenza A or influenza B antigen, infection due to influenza cannot be ruled-out because the antigen present in the sample may be below the detection limit of the test. It is recommended that these results be confirmed by viral culture or an FDA-cleared influenza A and B molecular assay.     RADIOLOGY:  Ct Head Wo Contrast  Result Date: 09/06/2016 CLINICAL DATA:  Left arm weakness and numbness beginning last night. EXAM: CT HEAD WITHOUT CONTRAST TECHNIQUE: Contiguous axial images were obtained from the base of the skull through the vertex without intravenous contrast. COMPARISON:  None. FINDINGS: Brain: Appears normal without hemorrhage, infarct, mass lesion, mass effect, midline shift or abnormal extra-axial fluid collection. No hydrocephalus or pneumocephalus. Vascular: Negative. Skull: Intact. Sinuses/Orbits: Negative. Other: None. IMPRESSION: Normal head CT. Electronically Signed   By: Drusilla Kanner M.D.   On: 09/06/2016 16:38   Mr Laqueta Jean NW Contrast  Result Date: 09/06/2016 CLINICAL DATA:  Left upper extremity numbness. Left-sided headache. Recent sinusitis. EXAM: MRI HEAD WITHOUT AND WITH CONTRAST TECHNIQUE: Multiplanar, multiecho pulse sequences of the brain and surrounding structures were obtained without and with intravenous contrast. CONTRAST:  20mL MULTIHANCE GADOBENATE DIMEGLUMINE 529 MG/ML IV SOLN COMPARISON:  Head CT from earlier today FINDINGS: Brain: There is roughly  linear patchy restricted diffusion in the posterior right frontal lobe, cortex and white matter, consistent with acute infarct. There is a small area of cortical based gliosis in the left parietal lobe, with subtle low-density appearance on previous head CT. No acute hemorrhage, hydrocephalus, or mass. Vascular: Major flow voids are preserved. 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 Patent major dural venous sinuses. Skull and upper cervical spine: Negative for marrow lesion. Sinuses/Orbits: Intermittently and variably limited by artifact from braces. Limited, mild mucosal thickening in the upper maxillary sinuses. No acute sinusitis suspected. IMPRESSION: 1. Small cluster of subcentimeter acute infarcts in the posterior right frontal lobe. 2. Small area of left parietal cortex gliosis, possibly remote infarct. 3. History of recent sinusitis and clinical concern for complicated sinusitis. No active sinusitis seen today. Electronically Signed   By: Marja Kays  Watts M.D.   On: 09/06/2016 18:55   [place] Carotid Bilateral  Result Date: 09/07/2016 CLINICAL DATA:  CVA EXAM: BILATERAL CAROTID DUPLEX ULTRASOUND TECHNIQUE: Wallace CullensGray scale imaging, color Doppler and duplex ultrasound were performed of bilateral carotid and vertebral arteries in the neck. COMPARISON:  None. FINDINGS: Criteria: Quantification of carotid stenosis is based on velocity parameters that correlate the residual internal carotid diameter with NASCET-based stenosis levels, using the diameter of the distal internal carotid lumen as the denominator for stenosis measurement. The following velocity measurements were obtained: RIGHT ICA:  75 cm/sec CCA:  80 cm/sec SYSTOLIC ICA/CCA RATIO:  0.9 DIASTOLIC ICA/CCA RATIO:  1.5 ECA:  61 cm/sec LEFT ICA:  57 cm/sec CCA:  75 cm/sec SYSTOLIC ICA/CCA RATIO:  0.8 DIASTOLIC ICA/CCA RATIO:  1.3 ECA:  86 cm/sec RIGHT CAROTID ARTERY: Little if any plaque in the bulb. Low resistance internal  carotid Doppler pattern. RIGHT VERTEBRAL ARTERY:  Antegrade. LEFT CAROTID ARTERY: Little if any plaque in the bulb. Low resistance internal carotid Doppler pattern. LEFT VERTEBRAL ARTERY:  Antegrade. Multiple bilateral thyroid nodules are noted. The largest on left measures 2.2 x 1.8 x 1.9 cm. IMPRESSION: Less than 50% stenosis in the right and left internal carotid arteries. Multiple thyroid nodules are noted. The largest measures 2.2 cm. Dedicated thyroid ultrasound is recommended. Electronically Signed   By: Jolaine ClickArthur  Hoss M.D.   On: 09/07/2016 10:27    EKG:   Orders placed or performed during the hospital encounter of 09/06/16  . ED EKG  . ED EKG  . EKG      Management plans discussed with the patient, family and they are in agreement.  CODE STATUS:     Code Status Orders        Start     Ordered   09/06/16 2121  Full code  Continuous     09/06/16 2121    Code Status History    Date Active Date Inactive Code Status Order ID Comments User Context   This patient has a current code status but no historical code status.      TOTAL TIME TAKING CARE OF THIS PATIENT: 45  minutes.   Note: This dictation was prepared with Dragon dictation along with smaller phrase technology. Any transcriptional errors that result from this process are unintentional.   @MEC @  on 09/08/2016 at 3:24 PM  Between 7am to 6pm - Pager - [phone]  After 6pm go to [email or web address] - password [email or web address] [facility]  [facility]  Office  [phone]  CC: Primary care physician; Dorcas CarrowJohnson, Megan P, DO

## 2016-09-08 NOTE — Progress Notes (Signed)
Received MD order to discharge patient to home, reviewed discharge instructions home meds follow up appointments and prescriptions with patient and patient verbalized understanding, patient was in a hurry to be discharged and is going to call back and stop by tomorrow for a return to work note from Dr Amado CoeGouru

## 2016-09-08 NOTE — Progress Notes (Addendum)
    [facility] has been requested to perform a transesophageal echocardiogram on Chui, Florance for evaluation of right frontal stroke.  After careful review of history and examination, the risks and benefits of transesophageal echocardiogram have been explained including risks of esophageal damage, perforation (1:10,000 risk), bleeding, pharyngeal hematoma as well as other potential complications associated with conscious sedation including aspiration, arrhythmia, respiratory failure and death. Alternatives to treatment were discussed, questions were answered. Patient is willing to proceed.   Review of telemetry does not show any evidence of arrhythmia to date. NSR, 70s bpm. TTE with normal LVSF, no evidence of clot.  Eula Listenyan Cyniah Gossard, PA-C 09/08/2016 8:34 AM

## 2016-09-08 NOTE — Progress Notes (Signed)
PT Cancellation Note  Patient Details Name: Margaret [name] MRN: [id number] DOB: 08/31/1970   Cancelled Treatment:    Reason Eval/Treat Not Completed: Other (comment). PT attempted to treat pt this AM. Pt refused and wants to wait until after her echo TEE is performed. PT will re-attempt treatment this afternoon.  Mabeline CarasMichelle C Helon Wisinski, PT, SPT  [place] L[place] 09/08/2016, 9:14 AM

## 2016-09-08 NOTE — Progress Notes (Signed)
*  PRELIMINARY RESULTS* Echocardiogram Echocardiogram Transesophageal has been performed.  Cristela BlueHege, Fabricio Endsley 09/08/2016, 10:46 AM

## 2016-09-08 NOTE — Discharge Instructions (Signed)
Follow-up with primary care physician in a week Follow-up with vascular neurology in 1-2 weeks Follow-up with endocrinology for thyroid nodules in 2 weeks Outpatient physical therapy

## 2016-09-08 NOTE — Progress Notes (Signed)
OT Cancellation Note  Patient Details Name: Margaret [name] MRN: [id number] DOB: 06/14/1970   Cancelled Treatment:    Reason Eval/Treat Not Completed: Patient at procedure or test/ unavailable. Chart reviewed. Upon attempt to treat, pt out of room for testing. Will re-attempt OT treatment session at later time as pt is available.  Richrd PrimeJamie Stiller, MPH, MS, OTR/L ascom [phone] 09/08/16, 9:46 AM

## 2016-09-08 NOTE — Care Management (Signed)
Physical therapy evaluation completed. Recommending outpatient services. [name] is in agreement with this recommendation.  Will get referral signed per Dr. Amado CoeGouru and fax to Rehabilitation Department [name] Merrick Rn MSN CCM Care Management [phone]

## 2016-09-08 NOTE — Consult Note (Signed)
Cardiology Consultation:   Patient ID: Margaret [name]; [id number]; May 22, 1970   Admit date: 09/06/2016 Date of Consult: 09/08/2016  Primary Care Provider: Dorcas Carrow, DO Primary Cardiologist: None Primary Electrophysiologist:  None   Patient Profile:   Margaret [name] is a 46 y.o. female with a hx of asthma and thyroid disease who is being seen today for the evaluation of stroke at the request of Margaret [name].  History of Present Illness:   [name] woman who developed left arm tingling 2 days ago. She awoke yesterday morning with severe headache and presented to the ED. She was found to have acute right frontal lobe stroke. Work-up for cause of stroke thus far has been negative. She denies history of heart disease and neurologic disease in the past. She has occasional exertional dyspnea but no chest pain or palpitations.  Past Medical History:  Diagnosis Date  . Asthma   . Thyroid disease     Past Surgical History:  Procedure Laterality Date  . FRACTURE SURGERY       Inpatient Medications: Scheduled Meds: . [MAR Hold] aspirin  325 mg Oral Daily  . [MAR Hold] atorvastatin  40 mg Oral q1800  . [MAR Hold] heparin  5,000 Units Subcutaneous Q8H  . [MAR Hold] levothyroxine  137 mcg Oral QAC breakfast  . [MAR Hold] potassium chloride  20 mEq Oral BID   Continuous Infusions: . sodium chloride     PRN Meds: [MAR Hold] docusate sodium  Allergies:   No Known Allergies  Social History:   Social History   Social History  . Marital status: Divorced    Spouse name: N/A  . Number of children: N/A  . Years of education: N/A   Occupational History  . Not on file.   Social History Main Topics  . Smoking status: Current Every Day Smoker    Packs/day: 0.25    Types: Cigarettes  . Smokeless tobacco: Never Used  . Alcohol use 0.0 oz/week     Comment: on occasion  . Drug use: No     Comment: Previously, but stopped years ago  . Sexual activity: Yes   Partners: Male    Birth control/ protection: None, Condom   Other Topics Concern  . Not on file   Social History Narrative  . No narrative on file    Family History:   The patient's family history includes Stroke in her father.  ROS:  Please see the history of present illness.  ROS  All other ROS reviewed and negative.     Physical Exam/Data:   Vitals:   09/08/16 0430 09/08/16 0836 09/08/16 0938 09/08/16 0959  BP: 132/84 126/78 124/77 (!) 130/98  Pulse: 73 78 76 71  Resp: 20 18 17 19   Temp: 98.1 F (36.7 C) 98.4 F (36.9 C) 98.4 F (36.9 C) 98.5 F (36.9 C)  TempSrc:  Oral Oral Oral  SpO2: 97% 98% 100% 99%  Weight:      Height:        Intake/Output Summary (Last 24 hours) at 09/08/16 1012 Last data filed at 09/07/16 1800  Gross per 24 hour  Intake              480 ml  Output                0 ml  Net              480 ml   Filed Weights   09/06/16 1613  Weight: 214 lb (97.1 kg)   Body mass index is 34.54 kg/m.  General:  Well nourished, well developed, in no acute distress. HEENT: normal Lymph: no adenopathy Neck: mild diffuse thyromegaly noted. No JVD. Endocrine:  No thryomegaly Vascular: No carotid bruits; FA pulses 2+ bilaterally without bruits  Cardiac:  normal S1, S2; RRR; no murmurs, rubs, or gallops Lungs:  Faint rhonchi. No wheezes or crackles. Good air movement. Abd: soft, nontender, no hepatomegaly  Ext: no edema Musculoskeletal:  No deformities, BUE and BLE strength normal and equal Skin: warm and dry  Neuro:  CNs 2-12 intact, no focal abnormalities noted Psych:  Anxious   EKG:  The EKG was personally reviewed and demonstrates NSR with non-specific T wave changes.  Relevant CV Studies: TTE (09/07/16): Normal.  Laboratory Data:  Chemistry Recent Labs Lab 09/06/16 1618 09/07/16 0442  NA 139 139  K 2.9* 3.9  CL 108 111  CO2 25 21*  GLUCOSE 101* 151*  BUN 8 11  CREATININE 0.83 0.76  CALCIUM 8.7* 8.9  GFRNONAA >60 >60  GFRAA >60  >60  ANIONGAP 6 7     Recent Labs Lab 09/06/16 1618  PROT 7.2  ALBUMIN 3.6  AST 23  ALT 13*  ALKPHOS 62  BILITOT 0.4   Hematology Recent Labs Lab 09/06/16 1618 09/07/16 0442  WBC 13.0* 7.4  RBC 4.39 4.35  HGB 12.1 12.1  HCT 36.3 36.3  MCV 82.6 83.5  MCH 27.6 27.9  MCHC 33.4 33.4  RDW 14.6* 14.6*  PLT 333 338   Cardiac Enzymes Recent Labs Lab 09/06/16 1618  TROPONINI <0.03   No results for input(s): TROPIPOC in the last 168 hours.  BNPNo results for input(s): BNP, PROBNP in the last 168 hours.  DDimer No results for input(s): DDIMER in the last 168 hours.  Radiology/Studies:  Ct Head Wo Contrast  Result Date: 09/06/2016 CLINICAL DATA:  Left arm weakness and numbness beginning last night. EXAM: CT HEAD WITHOUT CONTRAST TECHNIQUE: Contiguous axial images were obtained from the base of the skull through the vertex without intravenous contrast. COMPARISON:  None. FINDINGS: Brain: Appears normal without hemorrhage, infarct, mass lesion, mass effect, midline shift or abnormal extra-axial fluid collection. No hydrocephalus or pneumocephalus. Vascular: Negative. Skull: Intact. Sinuses/Orbits: Negative. Other: None. IMPRESSION: Normal head CT. Electronically Signed   By: Margaret [name]  Dalessio M.D.   On: 09/06/2016 16:38   Mr Margaret JeanBrain W WUWo Contrast  Result Date: 09/06/2016 CLINICAL DATA:  Left upper extremity numbness. Left-sided headache. Recent sinusitis. EXAM: MRI HEAD WITHOUT AND WITH CONTRAST TECHNIQUE: Multiplanar, multiecho pulse sequences of the brain and surrounding structures were obtained without and with intravenous contrast. CONTRAST:  20mL MULTIHANCE GADOBENATE DIMEGLUMINE 529 MG/ML IV SOLN COMPARISON:  Head CT from earlier today FINDINGS: Brain: There is roughly linear patchy restricted diffusion in the posterior right frontal lobe, cortex and white matter, consistent with acute infarct. There is a small area of cortical based gliosis in the left parietal lobe, with subtle  low-density appearance on previous head CT. No acute hemorrhage, hydrocephalus, or mass. Vascular: Major flow voids are preserved. 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 Patent major dural venous sinuses. Skull and upper cervical spine: Negative for marrow lesion. Sinuses/Orbits: Intermittently and variably limited by artifact from braces. Limited, mild mucosal thickening in the upper maxillary sinuses. No acute sinusitis suspected. IMPRESSION: 1. Small cluster of subcentimeter acute infarcts in the posterior right frontal lobe. 2. Small area of left parietal cortex gliosis, possibly  remote infarct. 3. History of recent sinusitis and clinical concern for complicated sinusitis. No active sinusitis seen today. Electronically Signed   By: Marnee Spring M.D.   On: 09/06/2016 18:55   US Carotid Bilateral  Result Date: 09/07/2016 CLINICAL DATA:  CVA EXAM: BILATERAL CAROTID DUPLEX ULTRASOUND TECHNIQUE: Wallace Cullens scale imaging, color Doppler and duplex ultrasound were performed of bilateral carotid and vertebral arteries in the neck. COMPARISON:  None. FINDINGS: Criteria: Quantification of carotid stenosis is based on velocity parameters that correlate the residual internal carotid diameter with NASCET-based stenosis levels, using the diameter of the distal internal carotid lumen as the denominator for stenosis measurement. The following velocity measurements were obtained: RIGHT ICA:  75 cm/sec CCA:  80 cm/sec SYSTOLIC ICA/CCA RATIO:  0.9 DIASTOLIC ICA/CCA RATIO:  1.5 ECA:  61 cm/sec LEFT ICA:  57 cm/sec CCA:  75 cm/sec SYSTOLIC ICA/CCA RATIO:  0.8 DIASTOLIC ICA/CCA RATIO:  1.3 ECA:  86 cm/sec RIGHT CAROTID ARTERY: Little if any plaque in the bulb. Low resistance internal carotid Doppler pattern. RIGHT VERTEBRAL ARTERY:  Antegrade. LEFT CAROTID ARTERY: Little if any plaque in the bulb. Low resistance internal carotid Doppler pattern. LEFT VERTEBRAL ARTERY:  Antegrade. Multiple bilateral  thyroid nodules are noted. The largest on left measures 2.2 x 1.8 x 1.9 cm. IMPRESSION: Less than 50% stenosis in the right and left internal carotid arteries. Multiple thyroid nodules are noted. The largest measures 2.2 cm. Dedicated thyroid ultrasound is recommended. Electronically Signed   By: Jolaine Click M.D.   On: 09/07/2016 10:27    Assessment and Plan:   1. Cryptogenic stroke: Acute right frontal CVA and gliosis concerning for possible prior CVA. Given negative w/u thus far and patient's young age, it is appropriate to perform TEE to exclude intracardiac thrombus and/or right to left shunt. The risks of the procedure were discussed with Ms. Shughart, who is okay to proceed. If TEE is negative, event monitor and/or loop recorder will need to be considered as an outpatient.   Signed, [name], MD  09/08/2016 10:12 AM

## 2016-09-09 ENCOUNTER — Encounter: Payer: Self-pay | Admitting: Internal Medicine

## 2016-09-09 LAB — LUPUS ANTICOAGULANT PANEL
DRVVT: 33 s (ref 0.0–47.0)
PTT Lupus Anticoagulant: 28.5 s (ref 0.0–51.9)

## 2016-09-09 LAB — HEMOGLOBIN A1C
Hgb A1c MFr Bld: 5.8 % — ABNORMAL HIGH (ref 4.8–5.6)
MEAN PLASMA GLUCOSE: 120 mg/dL

## 2016-09-09 NOTE — Progress Notes (Signed)
[facility]         [place], [place]   09/09/2016  Patient: Margaret [name]   Date of Birth:  04/04/1971  Date of admission:  09/06/2016  Date of Discharge  09/09/2016    To Whom it May Concern:   Margaret Baronia Fregeau  may return to work on 09/14/16.  PHYSICAL ACTIVITY:  Full  If you have any questions or concerns, please don't hesitate to call.  Sincerely,   Ramonita LabGouru, Renn Dirocco M.D Pager [phone]- [phone] Office : [phone]   .

## 2016-09-10 ENCOUNTER — Telehealth: Payer: Self-pay | Admitting: Internal Medicine

## 2016-09-10 NOTE — Telephone Encounter (Signed)
Pt called back she is on schedule for 09/22/16 with Dr End

## 2016-09-10 NOTE — Telephone Encounter (Signed)
-----   Message from Yvonne Kendallhristopher End, MD sent at 09/09/2016  5:05 PM EDT ----- Regarding: RE: Hospital f/u Sure. Please add her on to 6/19. Thanks.  Yvonne Kendallhristopher End, MD [facility] Pager: [phone]  ----- Message ----- From: Stann Mainlandlark, Jennifer O, RN Sent: 09/09/2016   4:17 PM To: Yvonne Kendallhristopher End, MD, Charlynn GrimesHiraa M Stroud, # Subject: RE: Hospital f/u                               Dr End,  [facility] There are no new patient appt's for any provider until August. If so, do you have any preference as to where you like to add on the patient?  Thanks, Victorino DikeJennifer  ----- Message ----- From: Yvonne KendallEnd, Christopher, MD Sent: 09/08/2016   6:37 PM To: Stann MainlandJennifer O Clark, RN Subject: Hospital f/u                                   [facility] Victorino DikeJennifer,  Can you set Ms. Feazell up for an appointment with Alycia Rossettiyan or me for f/u of stroke in about 2 weeks? Thanks.  Thayer Ohmhris

## 2016-09-10 NOTE — Telephone Encounter (Signed)
Lmov for patient to call back and schedule appt °

## 2016-09-11 LAB — FACTOR 5 LEIDEN

## 2016-09-14 ENCOUNTER — Encounter: Payer: Self-pay | Admitting: Neurology

## 2016-09-14 ENCOUNTER — Ambulatory Visit (INDEPENDENT_AMBULATORY_CARE_PROVIDER_SITE_OTHER): Payer: Medicaid Other | Admitting: Neurology

## 2016-09-14 VITALS — BP 121/88 | HR 86 | Ht 65.0 in | Wt 212.0 lb

## 2016-09-14 DIAGNOSIS — I6330 Cerebral infarction due to thrombosis of unspecified cerebral artery: Secondary | ICD-10-CM

## 2016-09-14 NOTE — Patient Instructions (Addendum)
We will get a 30 day cardiac monitor.   Stroke Prevention Some medical conditions and behaviors are associated with an increased chance of having a stroke. You may prevent a stroke by making healthy choices and managing medical conditions. How can I reduce my risk of having a stroke?  Stay physically active. Get at least 30 minutes of activity on most or all days.  Do not smoke. It may also be helpful to avoid exposure to secondhand smoke.  Limit alcohol use. Moderate alcohol use is considered to be: ? No more than 2 drinks per day for men. ? No more than 1 drink per day for nonpregnant women.  Eat healthy foods. This involves: ? Eating 5 or more servings of fruits and vegetables a day. ? Making dietary changes that address high blood pressure (hypertension), high cholesterol, diabetes, or obesity.  Manage your cholesterol levels. ? Making food choices that are high in fiber and low in saturated fat, trans fat, and cholesterol may control cholesterol levels. ? Take any prescribed medicines to control cholesterol as directed by your health care provider.  Manage your diabetes. ? Controlling your carbohydrate and sugar intake is recommended to manage diabetes. ? Take any prescribed medicines to control diabetes as directed by your health care provider.  Control your hypertension. ? Making food choices that are low in salt (sodium), saturated fat, trans fat, and cholesterol is recommended to manage hypertension. ? Ask your health care provider if you need treatment to lower your blood pressure. Take any prescribed medicines to control hypertension as directed by your health care provider. ? If you are 3018-45 years of age, have your blood pressure checked every 3-5 years. If you are 45 years of age or older, have your blood pressure checked every year.  Maintain a healthy weight. ? Reducing calorie intake and making food choices that are low in sodium, saturated fat, trans fat, and  cholesterol are recommended to manage weight.  Stop drug abuse.  Avoid taking birth control pills. ? Talk to your health care provider about the risks of taking birth control pills if you are over 45 years old, smoke, get migraines, or have ever had a blood clot.  Get evaluated for sleep disorders (sleep apnea). ? Talk to your health care provider about getting a sleep evaluation if you snore a lot or have excessive sleepiness.  Take medicines only as directed by your health care provider. ? For some people, aspirin or blood thinners (anticoagulants) are helpful in reducing the risk of forming abnormal blood clots that can lead to stroke. If you have the irregular heart rhythm of atrial fibrillation, you should be on a blood thinner unless there is a good reason you cannot take them. ? Understand all your medicine instructions.  Make sure that other conditions (such as anemia or atherosclerosis) are addressed. Get help right away if:  You have sudden weakness or numbness of the face, arm, or leg, especially on one side of the body.  Your face or eyelid droops to one side.  You have sudden confusion.  You have trouble speaking (aphasia) or understanding.  You have sudden trouble seeing in one or both eyes.  You have sudden trouble walking.  You have dizziness.  You have a loss of balance or coordination.  You have a sudden, severe headache with no known cause.  You have new chest pain or an irregular heartbeat. Any of these symptoms may represent a serious problem that is an emergency.  Do not wait to see if the symptoms will go away. Get medical help at once. Call your local emergency services (911 in [place] Do not drive yourself to the hospital. This information is not intended to replace advice given to you by your health care provider. Make sure you discuss any questions you have with your health care provider. Document Released: 04/30/2004 Document Revised: 08/29/2015  Document Reviewed: 09/23/2012 Elsevier Interactive Patient Education  2017 [employer].

## 2016-09-14 NOTE — Progress Notes (Signed)
Reason for visit: Stroke  Referring physician: [facility]  Margaret [name] is a 46 y.o. female  History of present illness:  Margaret [name] is a 46 year old right-handed black female with a recent admission to the hospital on 09/06/2016 with onset of symptoms the day before of left arm numbness and paresthesias, and some slight clumsiness of the left arm. The patient also developed a left-sided headache, she denies any visual changes, speech changes, or changes in swallowing. The patient denies any instability or falls, she is not had any change in bowel bladder function. She did not have any confusion. The patient initially was felt to have a migraine headache, CT of the head was unremarkable. The patient later had MRI evaluation of the brain that showed evidence of a right frontal stroke event. She was admitted for an evaluation. Carotid Doppler study, and 2-D echocardiogram were unremarkable. A TEE evaluation showed a very small PFO. The patient was placed on aspirin therapy. The patient is a smoker, she was counseled to discontinue smoking. The patient has a history of marijuana and cocaine abuse, she claims that this is not a recent problem. A urine drug screen was never checked during the hospitalization. The patient did have a hypercoagulable state workup, this was unremarkable. The patient is sent to this office for an evaluation. She has been placed on Lipitor during that admission. The hemoglobin A1c was 5.9.  Past Medical History:  Diagnosis Date  . Asthma   . Thyroid disease     Past Surgical History:  Procedure Laterality Date  . FRACTURE SURGERY    . TEE WITHOUT CARDIOVERSION N/A 09/08/2016   Procedure: TRANSESOPHAGEAL ECHOCARDIOGRAM (TEE);  Surgeon: Yvonne KendallEnd, Christopher, MD;  Location: [facility] ORS;  Service: Cardiovascular;  Laterality: N/A;    Family History  Problem Relation Age of Onset  . Stroke Father     Social history:  reports that she has been smoking Cigarettes.  She has  been smoking about 0.25 packs per day. She has never used smokeless tobacco. She reports that she drinks alcohol. She reports that she does not use drugs.  Medications:  Prior to Admission medications   Medication Sig Start Date End Date Taking? Authorizing Provider  aspirin EC 325 MG tablet Take 1 tablet (325 mg total) by mouth daily. 09/08/16 09/08/17 Yes Gouru, Deanna ArtisAruna, MD  atorvastatin (LIPITOR) 40 MG tablet Take 1 tablet (40 mg total) by mouth daily at 6 PM. 09/08/16  Yes Gouru, Aruna, MD  guaiFENesin-codeine 100-10 MG/5ML syrup Take 5 mLs by mouth every 4 (four) hours as needed. 08/20/16  Yes Tommi RumpsSummers, Rhonda L, PA-C  levothyroxine (SYNTHROID, LEVOTHROID) 137 MCG tablet Take 1 tablet (137 mcg total) by mouth daily before breakfast. 05/18/16  Yes Johnson, Megan P, DO  ondansetron (ZOFRAN ODT) 4 MG disintegrating tablet Take 1 tablet (4 mg total) by mouth every 8 (eight) hours as needed for nausea or vomiting. 08/20/16  Yes Bridget HartshornSummers, Rhonda L, PA-C     No Known Allergies  ROS:  Out of a complete 14 system review of symptoms, the patient complains only of the following symptoms, and all other reviewed systems are negative.  Snoring Confusion, headache, numbness, weakness Decreased energy, change in appetite  Blood pressure 121/88, pulse 86, height 5\' 5"  (1.651 m), weight 212 lb (96.2 kg).  Physical Exam  General: The patient is alert and cooperative at the time of the examination.  Eyes: Pupils are equal, round, and reactive to light. Discs are flat bilaterally.  Neck:  The neck is supple, no carotid bruits are noted.  Respiratory: The respiratory examination is clear.  Cardiovascular: The cardiovascular examination reveals a regular rate and rhythm, no obvious murmurs or rubs are noted.  Skin: Extremities are without significant edema.  Neurologic Exam  Mental status: The patient is alert and oriented x 3 at the time of the examination. The patient has apparent normal recent and remote  memory, with an apparently normal attention span and concentration ability.  Cranial nerves: Facial symmetry is present. There is good sensation of the face to pinprick and soft touch bilaterally. The strength of the facial muscles and the muscles to head turning and shoulder shrug are normal bilaterally. Speech is well enunciated, no aphasia or dysarthria is noted. Extraocular movements are full. Visual fields are full. The tongue is midline, and the patient has symmetric elevation of the soft palate. No obvious hearing deficits are noted.  Motor: The motor testing reveals 5 over 5 strength of all 4 extremities. Good symmetric motor tone is noted throughout.  Sensory: Sensory testing is intact to pinprick, soft touch, vibration sensation, and position sense on all 4 extremities. No evidence of extinction is noted.  Coordination: Cerebellar testing reveals good finger-nose-finger and heel-to-shin bilaterally.  Gait and station: Gait is normal. Tandem gait is normal. Romberg is negative. No drift is seen.  Reflexes: Deep tendon reflexes are symmetric and normal bilaterally. Toes are downgoing bilaterally.   MRI brain 09/06/16:  IMPRESSION: 1. Small cluster of subcentimeter acute infarcts in the posterior right frontal lobe. 2. Small area of left parietal cortex gliosis, possibly remote infarct. 3. History of recent sinusitis and clinical concern for complicated sinusitis. No active sinusitis seen today.  * MRI scan images were reviewed online. I agree with the written report.   Carotid doppler 09/07/16:  IMPRESSION: Less than 50% stenosis in the right and left internal carotid arteries.  Multiple thyroid nodules are noted. The largest measures 2.2 cm. Dedicated thyroid ultrasound is recommended.   2D echo 09/07/16:  Study Conclusions  - Left ventricle: The cavity size was normal. Wall thickness was   normal. Systolic function was normal. The estimated ejection   fraction was in  the range of 60% to 65%. Wall motion was normal;   there were no regional wall motion abnormalities. Doppler   parameters are consistent with abnormal left ventricular   relaxation (grade 1 diastolic dysfunction).  Impressions:  - No cardiac source of emboli was indentified.   TEE 09/08/16:  Study Conclusions  - Left ventricle: The cavity size was normal. Wall thickness was   normal. Systolic function was normal. - Aortic valve: No evidence of vegetation. There was trivial   regurgitation. - Mitral valve: No evidence of vegetation. There was mild   regurgitation. - Left atrium: No evidence of thrombus in the atrial cavity or   appendage. - Right atrium: No evidence of thrombus in the atrial cavity or   appendage. No evidence of thrombus in the appendage. - Atrial septum: Agitated saline contrast study showed a very small   right-to-left shunt, at baseline or with provocation. Though no   PFO or ASD is clear identified, a tiny shunt cannot be excluded   based on early appearance of a small number of bubbles in the   left ventricle. - Tricuspid valve: No evidence of vegetation. - Pulmonic valve: No evidence of vegetation.  Impressions:  - Evidence of very small atrial level right-to-left shunt by bubble   study without visualization of  ASD or PFO.   Veinous doppler 09/08/16:  IMPRESSION: No evidence of DVT involving either the right or left lower extremity.   Assessment/Plan:  1. Right frontal stroke  2. Borderline diabetes  3. Dyslipidemia  4. Tobacco abuse, prior history of cocaine and marijuana abuse  The patient is to remain on aspirin this time. The source of stroke is not clear, a urine drug screen was never checked. The patient will be sent for a 30 day cardiac monitor study, if this is unremarkable she will remain on aspirin. She will follow-up with her primary care physician to follow the blood pressure, and cholesterol. I have counseled the patient  regarding a low carbohydrate diet, and to increase exercise to help control blood pressure and blood sugars. She will follow-up through this office on an as-needed basis. The patient has minimal if any deficit from the stroke event. The patient works as a Lawyer.  Marlan Palau MD 09/14/2016 11:17 AM  [facility] [address] [place], [place] [address]  Phone [phone] Fax [phone]

## 2016-09-16 ENCOUNTER — Other Ambulatory Visit: Payer: Self-pay | Admitting: Neurology

## 2016-09-16 ENCOUNTER — Ambulatory Visit (INDEPENDENT_AMBULATORY_CARE_PROVIDER_SITE_OTHER): Payer: Medicaid Other

## 2016-09-16 DIAGNOSIS — I639 Cerebral infarction, unspecified: Secondary | ICD-10-CM

## 2016-09-16 DIAGNOSIS — I6330 Cerebral infarction due to thrombosis of unspecified cerebral artery: Secondary | ICD-10-CM

## 2016-09-16 DIAGNOSIS — I4891 Unspecified atrial fibrillation: Secondary | ICD-10-CM | POA: Diagnosis not present

## 2016-09-17 ENCOUNTER — Encounter: Payer: Self-pay | Admitting: Family Medicine

## 2016-09-17 ENCOUNTER — Ambulatory Visit: Payer: Medicaid Other | Attending: Family Medicine | Admitting: Physical Therapy

## 2016-09-17 ENCOUNTER — Encounter: Payer: Self-pay | Admitting: Physical Therapy

## 2016-09-17 ENCOUNTER — Ambulatory Visit (INDEPENDENT_AMBULATORY_CARE_PROVIDER_SITE_OTHER): Payer: Medicaid Other | Admitting: Family Medicine

## 2016-09-17 DIAGNOSIS — R262 Difficulty in walking, not elsewhere classified: Secondary | ICD-10-CM | POA: Diagnosis present

## 2016-09-17 DIAGNOSIS — Z8673 Personal history of transient ischemic attack (TIA), and cerebral infarction without residual deficits: Secondary | ICD-10-CM

## 2016-09-17 DIAGNOSIS — E782 Mixed hyperlipidemia: Secondary | ICD-10-CM

## 2016-09-17 DIAGNOSIS — R278 Other lack of coordination: Secondary | ICD-10-CM | POA: Diagnosis present

## 2016-09-17 DIAGNOSIS — E785 Hyperlipidemia, unspecified: Secondary | ICD-10-CM | POA: Insufficient documentation

## 2016-09-17 DIAGNOSIS — M6281 Muscle weakness (generalized): Secondary | ICD-10-CM | POA: Diagnosis not present

## 2016-09-17 DIAGNOSIS — R7301 Impaired fasting glucose: Secondary | ICD-10-CM | POA: Diagnosis not present

## 2016-09-17 DIAGNOSIS — Z720 Tobacco use: Secondary | ICD-10-CM

## 2016-09-17 DIAGNOSIS — E039 Hypothyroidism, unspecified: Secondary | ICD-10-CM

## 2016-09-17 DIAGNOSIS — F331 Major depressive disorder, recurrent, moderate: Secondary | ICD-10-CM

## 2016-09-17 MED ORDER — BUPROPION HCL ER (SR) 150 MG PO TB12: ORAL_TABLET | ORAL | 3 refills | Status: DC

## 2016-09-17 NOTE — Assessment & Plan Note (Signed)
Of unknown cause. Hypercoagulable work up negative, possibly due to smoking with depo. Continue to follow with cardiology and neurology. Work on smoking cessation. Call with any concerns.

## 2016-09-17 NOTE — Assessment & Plan Note (Signed)
Was not taking her medicine prior to hospitalization. Will recheck TSH in 1 month prior to adjusting dose. Will work on taking medication every morning as instructed.

## 2016-09-17 NOTE — Therapy (Addendum)
[facility] MAIN [facility] SERVICES [address] [place], [place], [address] Phone: [phone]   Fax:  [phone]  Physical Therapy Evaluation  Patient Details  Name: Margaret [name] MRN: [id number] Date of Birth: May 30, 1970 Referring Provider: Dr. Laural Benes  Encounter Date: 09/17/2016      PT End of Session - 09/17/16 1744    Visit Number 1   Number of Visits 7   Date for PT Re-Evaluation 10/29/16   Authorization Type medicaid   PT Start Time 1645   PT Stop Time 1735   PT Time Calculation (min) 50 min   Activity Tolerance Patient tolerated treatment well   Behavior During Therapy Flat affect      Past Medical History:  Diagnosis Date  . Asthma    controlled  . Thyroid disease     Past Surgical History:  Procedure Laterality Date  . FRACTURE SURGERY    . TEE WITHOUT CARDIOVERSION N/A 09/08/2016   Procedure: TRANSESOPHAGEAL ECHOCARDIOGRAM (TEE);  Surgeon: [name], MD;  Location: [facility] ORS;  Service: Cardiovascular;  Laterality: N/A;    There were no vitals filed for this visit.       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 15 min   How long can you walk comfortably? walks in grocery story, will lean over buggy;    Diagnostic tests MRI shows right frontal lobe CVA;    Patient Stated Goals "improve strength for work"    Currently in Pain? No/denies            [facility] PT Assessment - 09/17/16 0001      Assessment   Medical Diagnosis s/p CVA   Referring Provider Dr. Laural Benes   Onset Date/Surgical Date 09/05/16   Hand Dominance Right   Next MD Visit 2 weeks   Prior Therapy denies any PT for this condition; Denies any PT in the past     Precautions   Precautions None   Required Braces or Orthoses --  has neoprene sleeve for left knee when feeling pain;      Restrictions   Weight Bearing Restrictions No     Balance Screen   Has the patient fallen in the past 6 months No   Has the patient had a decrease in activity level because of a fear of falling?  Yes   Is the patient reluctant to leave their home because of a fear of falling?  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; works part time  due to left knee pain;    Leisure go to park, swimming, be outdoors with kids;      Cognition   Overall Cognitive Status Within Functional Limits for tasks assessed     Observation/Other Assessments   Observations Patient sits with arms crossed, appears not interested or down;      Sensation   Light Touch Appears Intact   Additional Comments reports intermittent numbness/tingling in hands/feet but intact during PT evaluation;      Coordination   Gross Motor Movements are Fluid and Coordinated Yes   Fine Motor Movements are Fluid and Coordinated Yes     Posture/Postural Control   Posture Comments sits with slumped posture, able to self correct with verbal cues;      AROM   Overall AROM Comments BUE [facility] BLE [facility]     Strength   Overall Strength Comments BUE grossly 3/5, BLE: hip 3+/5, knee 3/5, ankle 3+/5   Right  Hand Grip (lbs) 10   Left Hand Grip (lbs) 5     Transfers   Comments able to transfer sit<>Stand without pushing on arm rests;      Ambulation/Gait   Gait Comments ambulates without AD, reciprocal gait pattern, slumped posture, normal base of support, toes out, no foot drag, slower gait speed;      6 Minute Walk- Baseline   BP (mmHg) 114/72   HR (bpm) 88   02 Sat (%RA) 97 %     6 Minute walk- Post Test   BP (mmHg) 118/68   HR (bpm) 100   02 Sat (%RA) 100 %     6 minute walk test results    Aerobic Endurance Distance Walked 1185   Endurance additional comments >1800 feet is age group norms; 1000 feet is community ambulator distance;      Standardized Balance Assessment   Five times sit to stand comments  22 sec without HHA (>10 sec indicates increased risk for falls)     High Level Balance   High Level Balance Comments SLS: 3 sec each LE; tandem stance 10 sec each foot in front; able to stand with feet together, eyes closed for 10 sec with slight sway;             Objective measurements completed on examination: See above findings.                  PT Education - 09/17/16 1744    Education provided Yes   Education Details recommendations, plan of care   Person(s) Educated Patient   Methods Explanation   Comprehension Verbalized understanding            PT Long Term Goals - 09/17/16 1750      PT LONG TERM GOAL #1   Title Patient will be independent in home exercise program to improve strength/mobility for better functional independence with ADLs.   Baseline Patient does not have a home exercise program.    Time 6   Period Weeks   Status New     PT LONG TERM GOAL #2   Title Patient will increase BLE gross strength to 4+/5 as to improve functional strength for independent gait, increased standing tolerance and increased ADL ability.   Baseline BUE grossly 3/5, BLE: hip 3+/5, knee 3/5, ankle 3+/5   Time 6   Period Weeks   Status New     PT  LONG TERM GOAL #3   Title Patient (< 45 years old) will complete  five times sit to stand test in < 10 seconds indicating an increased LE strength and improved balance.   Baseline 22 sec without HHA   Time 6   Period Weeks   Status New     PT LONG TERM GOAL #4   Title Patient will increase lower extremity functional scale to >60/80 to demonstrate improved functional mobility and increased tolerance with ADLs.    Baseline did not complete; will address next visit;    Time 6   Period Weeks   Status New                  Plan - 09/17/16 1744    Clinical Impression Statement 46 yo Female s/p CVA on 09/05/16 with left LE weakness. Patient is unsure of cause of CVA and reports that she is still working part time and caring for her 21 year old daughter. She did exhibit weaknes in BUE and BLE. During evaluation patient appears to be uninterested and down. She had a flat affect and did not seem to try her best during MMT or walk tests. Patient is able to ambulate independently with reciprocal gait pattern.S he does ambulate with slower gait speed. Patient exhibits good standing balance. 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);    Clinical Presentation Stable   Clinical Presentation due to: no progression of deficits;    Clinical Decision Making Low   Rehab Potential Good   Clinical Impairments Affecting Rehab Potential negative: concern about patient's interest in therapy as she had a flat affect during evaluation and seemed uninterested;    PT Frequency 1x / week   PT Duration 6 weeks   PT Treatment/Interventions Cryotherapy;Gait training;Moist Heat;Functional mobility  training;Therapeutic activities;Therapeutic exercise;Patient/family education;Energy conservation   PT Next Visit Plan initiate HEP- LE strengthening   PT Home Exercise Plan will address next visit   Recommended Other Services has OT evaluation scheduled next week;    Consulted and Agree with Plan of Care Patient      Patient will benefit from skilled therapeutic intervention in order to improve the following deficits and impairments:  Decreased mobility, Decreased activity tolerance, Decreased endurance, Decreased strength, Difficulty walking  Visit Diagnosis: Muscle weakness (generalized) - Plan: PT plan of care cert/re-cert  Difficulty in walking, not elsewhere classified - Plan: PT plan of care cert/re-cert     Problem List Patient Active Problem List   Diagnosis Date Noted  . Hyperlipidemia 09/17/2016  . IFG (impaired fasting glucose) 09/17/2016  . History of stroke 09/06/2016  . Myopia 12/20/2014  . Abdominal pain 12/20/2014  . Depression 12/20/2014  . Right arm pain 11/21/2014  . Thyroid activity decreased 10/18/2014  . Abnormal CBC 10/18/2014  . Tobacco abuse 10/18/2014    Dang Mathison PT, DPT 09/17/2016, 5:52 PM  [place] [facility] MAIN [facility] SERVICES [address] [place], [place], [address] Phone: [phone]   Fax:  [phone]  Name: Margaret [name] MRN: [id number] Date of Birth: 01/12/1971

## 2016-09-17 NOTE — Assessment & Plan Note (Signed)
Will start her on wellbutrin to help with cravings. Rx sent to her pharmacy. Recheck 1 month.

## 2016-09-17 NOTE — Assessment & Plan Note (Signed)
Not under good control. Will start wellbutrin to help with both smoking and mood. Recheck 1 month.

## 2016-09-17 NOTE — Assessment & Plan Note (Signed)
Continue to work on diet and exercise. Call with any concerns.  

## 2016-09-17 NOTE — Assessment & Plan Note (Signed)
Tolerating medicine well. Will recheck for tolerance in 1 month.

## 2016-09-17 NOTE — Progress Notes (Signed)
BP 100/71 (BP Location: Left Arm, Patient Position: Sitting, Cuff Size: Large)   Pulse 86   Temp 98.1 F (36.7 C)   Wt 210 lb 1.6 oz (95.3 kg)   SpO2 99%   BMI 34.96 kg/m    Subjective:    Patient ID: Margaret [name], female    DOB: 04/10/1970, 46 y.o.   MRN: [id number]  HPI: Margaret [name] is a 46 y.o. female  Chief Complaint  Patient presents with  . Hospitalization Follow-up  . Nicotine Dependence   HOSPITAL FOLLOW UP Time since discharge: 9 days  Hospital/facility: [facility] Diagnosis: CVA Procedures/tests: TEE- Very Small PFO, MRI- R frontal stroke, Carotid doppler and 2D ECHO- normal, CT head- normal, negative hypercoag work up, Utox- never done, A1c 5.9, venous doppler- normal HOSPITAL COURSE:  HISTORY OF PRESENT ILLNESS: Margaret [name] a 46 y.o.femalewith a known history of Asthma, thyroid disease - started to have severe headache and left arm numbness and weakness since yesterday afternoon. Concerned with this she came to emergency room today initially ER physician thought it is a migraine as CT scan of the head was negative and give some pain medication. He also ordered a MRI on the brain and it reported positive for acute stroke so he suggested to admit to medical service for further management.  Acute stroke MRI brain with right frontal lobe infarct Echocardiogram normal but neurologist has recommended TEE given her young age with acute stroke TEE -Evidence of very small atrial level right-to-left shunt by bubble study without visualization of ASD or PFO. carotid Doppler studies-no significant stenosis Coag workup ordered including protein C, protein S, antithrombin III, factor V Leiden lipid panel-LDL 133 start high intensity statin hemoglobin A1c -5.9 appreciate neurology recommendations  Patient came more than 24 hours after starting the symptoms so she is out of the window for TPA. Physical therapy has recommended outpatient PT neurology  has recommended alteration follow-up with vascular neurology for further investigations  *Hyperlipidemia statin  *Multiple thyroid nodules outpatient follow-up with primary care physician for further workup. TSH is elevated at 8.002, endocrinology follow-up  * Active smoking Counseled her about increased risk of thromboembolic phenomena and coronary artery disease while using hormonal contraceptive therapy and smoking. Counseled to quit smoking for 4 minutes and offered nicotine patch.  Consultants: Neurology, cardiology New medications: Aspirin, lipitor Discharge instructions:  Follow up with neurology, here and with endocrinology Status: stable  Saw Neurology on 09/14/16- They note that the cause of her stroke is unknown at this time as her Utox was not checked. Started on aspirin. Going to do 30 day cardiac monitor.   Margaret [name] states that she has not used cocaine in a while. She does admit to drinking and having a few tokes of marijuana prior to going out the night before her stroke. She denies it feeling different or like it was laced. She has not done anything since then.  HYPERLIPIDEMIA Hyperlipidemia status: newly diagnosed Satisfied with current treatment?  yes Side effects:  no Medication compliance: fair compliance Past cholesterol meds: atorvastatin Supplements: none Aspirin:  no Chest pain:  no Coronary artery disease:  no Family history CAD:  yes  Impaired Fasting Glucose HbA1C:  Lab Results  Component Value Date   HGBA1C 5.8 (H) 09/08/2016   Duration of elevated blood sugar: unknown Polydipsia: no Polyuria: no Weight change: no Visual disturbance: no Glucose Monitoring: no Diabetic Education: Not Completed Family history of diabetes: yes  HYPOTHYROIDISM- was not taking her medicine regularly prior to  being hospitalized. Does not know how often she was taking her medicine.  Thyroid control status:uncontrolled Satisfied with current treatment?  no Medication side effects: no Medication compliance: poor compliance Etiology of hypothyroidism:  Recent dose adjustment:no Fatigue: yes Cold intolerance: no Heat intolerance: no Weight gain: no Weight loss: no Constipation: yes Diarrhea/loose stools: no Palpitations: no Lower extremity edema: no Anxiety/depressed mood: yes  Relevant past medical, surgical, family and social history reviewed and updated as indicated. Interim medical history since our last visit reviewed. Allergies and medications reviewed and updated.  Review of Systems  Constitutional: Negative.   Respiratory: Negative.   Cardiovascular: Negative.   Neurological: Negative.   Psychiatric/Behavioral: Negative.     Per HPI unless specifically indicated above     Objective:    BP 100/71 (BP Location: Left Arm, Patient Position: Sitting, Cuff Size: Large)   Pulse 86   Temp 98.1 F (36.7 C)   Wt 210 lb 1.6 oz (95.3 kg)   SpO2 99%   BMI 34.96 kg/m   Wt Readings from Last 3 Encounters:  09/17/16 210 lb 1.6 oz (95.3 kg)  09/14/16 212 lb (96.2 kg)  09/06/16 214 lb (97.1 kg)    Physical Exam  Constitutional: She is oriented to person, place, and time. She appears well-developed and well-nourished. No distress.  HENT:  Head: Normocephalic and atraumatic.  Right Ear: Hearing normal.  Left Ear: Hearing normal.  Nose: Nose normal.  Eyes: Conjunctivae and lids are normal. Right eye exhibits no discharge. Left eye exhibits no discharge. No scleral icterus.  Cardiovascular: Normal rate, regular rhythm, normal heart sounds and intact distal pulses.  Exam reveals no gallop and no friction rub.   No murmur heard. Pulmonary/Chest: Effort normal and breath sounds normal. No respiratory distress. She has no wheezes. She has no rales. She exhibits no tenderness.  Musculoskeletal: Normal range of motion.  Neurological: She is alert and oriented to person, place, and time.  Skin: Skin is warm, dry and intact. No rash  noted. No erythema. No pallor.  Psychiatric: She has a normal mood and affect. Her speech is normal and behavior is normal. Judgment and thought content normal. Cognition and memory are normal.  Nursing note and vitals reviewed.   Results for orders placed or performed during the hospital encounter of 09/06/16  Protime-INR  Result Value Ref Range   Prothrombin Time 14.6 11.4 - 15.2 seconds   INR 1.13   APTT  Result Value Ref Range   aPTT 30 24 - 36 seconds  CBC  Result Value Ref Range   WBC 13.0 (H) 3.6 - 11.0 K/uL   RBC 4.39 3.80 - 5.20 MIL/uL   Hemoglobin 12.1 12.0 - 16.0 g/dL   HCT 40.9 81.1 - 91.4 %   MCV 82.6 80.0 - 100.0 fL   MCH 27.6 26.0 - 34.0 pg   MCHC 33.4 32.0 - 36.0 g/dL   RDW 78.2 (H) 95.6 - 21.3 %   Platelets 333 150 - 440 K/uL  Differential  Result Value Ref Range   Neutrophils Relative % 59 %   Neutro Abs 7.7 (H) 1.4 - 6.5 K/uL   Lymphocytes Relative 34 %   Lymphs Abs 4.4 (H) 1.0 - 3.6 K/uL   Monocytes Relative 5 %   Monocytes Absolute 0.6 0.2 - 0.9 K/uL   Eosinophils Relative 1 %   Eosinophils Absolute 0.1 0 - 0.7 K/uL   Basophils Relative 1 %   Basophils Absolute 0.1 0 - 0.1 K/uL  Comprehensive  metabolic panel  Result Value Ref Range   Sodium 139 135 - 145 mmol/L   Potassium 2.9 (L) 3.5 - 5.1 mmol/L   Chloride 108 101 - 111 mmol/L   CO2 25 22 - 32 mmol/L   Glucose, Bld 101 (H) 65 - 99 mg/dL   BUN 8 6 - 20 mg/dL   Creatinine, Ser 2.84 0.44 - 1.00 mg/dL   Calcium 8.7 (L) 8.9 - 10.3 mg/dL   Total Protein 7.2 6.5 - 8.1 g/dL   Albumin 3.6 3.5 - 5.0 g/dL   AST 23 15 - 41 U/L   ALT 13 (L) 14 - 54 U/L   Alkaline Phosphatase 62 38 - 126 U/L   Total Bilirubin 0.4 0.3 - 1.2 mg/dL   GFR calc non Af Amer >60 >60 mL/min   GFR calc Af Amer >60 >60 mL/min   Anion gap 6 5 - 15  Troponin I  Result Value Ref Range   Troponin I <0.03 <0.03 ng/mL  Glucose, capillary  Result Value Ref Range   Glucose-Capillary 96 65 - 99 mg/dL  Hemoglobin X3K  Result Value  Ref Range   Hgb A1c MFr Bld 6.0 (H) 4.8 - 5.6 %   Mean Plasma Glucose 126 mg/dL  Lipid panel  Result Value Ref Range   Cholesterol 158 0 - 200 mg/dL   Triglycerides 440 <102 mg/dL   HDL 29 (L) >72 mg/dL   Total CHOL/HDL Ratio 5.4 RATIO   VLDL 27 0 - 40 mg/dL   LDL Cholesterol 536 (H) 0 - 99 mg/dL  Basic metabolic panel  Result Value Ref Range   Sodium 139 135 - 145 mmol/L   Potassium 3.9 3.5 - 5.1 mmol/L   Chloride 111 101 - 111 mmol/L   CO2 21 (L) 22 - 32 mmol/L   Glucose, Bld 151 (H) 65 - 99 mg/dL   BUN 11 6 - 20 mg/dL   Creatinine, Ser 6.44 0.44 - 1.00 mg/dL   Calcium 8.9 8.9 - 03.4 mg/dL   GFR calc non Af Amer >60 >60 mL/min   GFR calc Af Amer >60 >60 mL/min   Anion gap 7 5 - 15  CBC  Result Value Ref Range   WBC 7.4 3.6 - 11.0 K/uL   RBC 4.35 3.80 - 5.20 MIL/uL   Hemoglobin 12.1 12.0 - 16.0 g/dL   HCT 74.2 59.5 - 63.8 %   MCV 83.5 80.0 - 100.0 fL   MCH 27.9 26.0 - 34.0 pg   MCHC 33.4 32.0 - 36.0 g/dL   RDW 75.6 (H) 43.3 - 29.5 %   Platelets 338 150 - 440 K/uL  HIV antibody  Result Value Ref Range   HIV Screen 4th Generation wRfx Non Reactive Non Reactive  Hemoglobin A1c  Result Value Ref Range   Hgb A1c MFr Bld 5.9 (H) 4.8 - 5.6 %   Mean Plasma Glucose 123 mg/dL  Lipid panel  Result Value Ref Range   Cholesterol 179 0 - 200 mg/dL   Triglycerides 57 <188 mg/dL   HDL 35 (L) >41 mg/dL   Total CHOL/HDL Ratio 5.1 RATIO   VLDL 11 0 - 40 mg/dL   LDL Cholesterol 660 (H) 0 - 99 mg/dL  Protein C, total  Result Value Ref Range   Protein C, Total 88 60 - 150 %  Protein S, total  Result Value Ref Range   Protein S Ag, Total 85 60 - 150 %  Lupus anticoagulant panel  Result Value Ref Range  PTT Lupus Anticoagulant 28.5 0.0 - 51.9 sec   DRVVT 33.0 0.0 - 47.0 sec   Lupus Anticoag Interp Comment:   Factor 5 leiden  Result Value Ref Range   Recommendations-F5LEID: Comment   Antithrombin III  Result Value Ref Range   AntiThromb III Func 95 75 - 120 %  Homocysteine   Result Value Ref Range   Homocysteine 7.6 0.0 - 15.0 umol/L  Cardiolipin antibodies, IgG, IgM, IgA  Result Value Ref Range   Anticardiolipin IgG <9 0 - 14 GPL U/mL   Anticardiolipin IgM <9 0 - 12 MPL U/mL   Anticardiolipin IgA <9 0 - 11 APL U/mL  C-reactive protein  Result Value Ref Range   CRP 1.0 (H) <1.0 mg/dL  Sedimentation rate  Result Value Ref Range   Sed Rate 41 (H) 0 - 20 mm/hr  Hemoglobin A1c  Result Value Ref Range   Hgb A1c MFr Bld 5.8 (H) 4.8 - 5.6 %   Mean Plasma Glucose 120 mg/dL  TSH  Result Value Ref Range   TSH 8.002 (H) 0.350 - 4.500 uIU/mL  ECHOCARDIOGRAM COMPLETE  Result Value Ref Range   Weight 3,424 oz   Height 66 in   BP 127/81 mmHg      Assessment & Plan:   Problem List Items Addressed This Visit      Endocrine   Thyroid activity decreased    Was not taking her medicine prior to hospitalization. Will recheck TSH in 1 month prior to adjusting dose. Will work on taking medication every morning as instructed.       IFG (impaired fasting glucose)    Continue to work on diet and exercise. Call with any concerns.         Other   Tobacco abuse    Will start her on wellbutrin to help with cravings. Rx sent to her pharmacy. Recheck 1 month.       Depression    Not under good control. Will start wellbutrin to help with both smoking and mood. Recheck 1 month.       Relevant Medications   buPROPion (WELLBUTRIN SR) 150 MG 12 hr tablet   History of stroke    Of unknown cause. Hypercoagulable work up negative, possibly due to smoking with depo. Continue to follow with cardiology and neurology. Work on smoking cessation. Call with any concerns.       Hyperlipidemia    Tolerating medicine well. Will recheck for tolerance in 1 month.          Follow up plan: Return 3-4 weeks, for Follow up smoking, cholesterol and thyroid and mood.

## 2016-09-21 ENCOUNTER — Ambulatory Visit: Payer: Medicaid Other | Admitting: Occupational Therapy

## 2016-09-22 ENCOUNTER — Ambulatory Visit: Payer: Medicaid Other | Admitting: Occupational Therapy

## 2016-09-22 ENCOUNTER — Ambulatory Visit: Payer: Medicaid Other | Admitting: Internal Medicine

## 2016-09-22 ENCOUNTER — Encounter: Payer: Self-pay | Admitting: Occupational Therapy

## 2016-09-22 DIAGNOSIS — M6281 Muscle weakness (generalized): Secondary | ICD-10-CM

## 2016-09-22 DIAGNOSIS — R278 Other lack of coordination: Secondary | ICD-10-CM

## 2016-09-23 ENCOUNTER — Ambulatory Visit (INDEPENDENT_AMBULATORY_CARE_PROVIDER_SITE_OTHER): Payer: Medicaid Other | Admitting: Internal Medicine

## 2016-09-23 ENCOUNTER — Encounter: Payer: Self-pay | Admitting: Internal Medicine

## 2016-09-23 VITALS — BP 100/80 | HR 90 | Ht 65.0 in | Wt 212.2 lb

## 2016-09-23 DIAGNOSIS — Q211 Atrial septal defect: Secondary | ICD-10-CM | POA: Diagnosis not present

## 2016-09-23 DIAGNOSIS — I639 Cerebral infarction, unspecified: Secondary | ICD-10-CM | POA: Insufficient documentation

## 2016-09-23 DIAGNOSIS — Q2112 Patent foramen ovale: Secondary | ICD-10-CM

## 2016-09-23 NOTE — Progress Notes (Signed)
Follow-up Outpatient Visit Date: 09/23/2016  Primary Care Provider: Dorcas CarrowJohnson, Megan P, DO [address] [place] [id number]  Chief Complaint: Follow-up stroke  HPI:  [name] is a 46 y.o. year-old female with history of recent stroke, asthma, and thyroid disease, who presents for follow-up of stroke. She was admitted earlier this month with headache and was found to have an acute right frontal lobe stroke. Workup in the hospital including transthoracic and transesophageal echocardiogram was notable for possible small atrial level shunt that was notable with bubble study. Actual defect could not be visualized. She was placed on aspirin and statin therapy and follows up today. She is not had any new or logic deficits. She continues to have some fatigue but would like to return back to work later this week. She denies chest pain, palpitations, shortness of breath, orthopnea, PND, and edema. She recently followed up with neurology and was given a 30 day event monitor to evaluate for an arrhythmic genic cause of her stroke. However, she accidentally threw away her monitor yesterday, after having worn it for about a week. She remains compliant with her medications and denies side effects.Marland Kitchen.  --------------------------------------------------------------------------------------------------  Cardiovascular History & Procedures: Cardiovascular Problems:  Stroke  Risk Factors:  Stroke  Cath/PCI:  None  CV Surgery:  None  EP Procedures and Devices:  None  Non-Invasive Evaluation(s):  TEE (09/08/16): Normal LV size and wall thickness. Normal systolic function. Trivial aortic regurgitation. Mild MR. No left atrial thrombus. No right atrial thrombus. Atrial septum with question of small right to left shunt, with appearance of small number of bubbles in the left ventricle after injection of agitated saline.  TTE (09/07/16): Normal LV size and wall thickness. LVEF 60-65% with normal wall motion.  Grade 1 diastolic dysfunction. Normal RV size and function. No significant valvular abnormalities.  Recent CV Pertinent Labs: Lab Results  Component Value Date   CHOL 179 09/07/2016   CHOL 195 12/20/2014   CHOL 140 09/04/2013   HDL 35 (L) 09/07/2016   HDL 41 12/20/2014   HDL 30 (L) 09/04/2013   LDLCALC 133 (H) 09/07/2016   LDLCALC 120 (H) 12/20/2014   LDLCALC 83 09/04/2013   TRIG 57 09/07/2016   TRIG 137 09/04/2013   CHOLHDL 5.1 09/07/2016   INR 1.13 09/06/2016   INR 1.2 09/04/2013   K 3.9 09/07/2016   K 3.8 09/04/2013   MG 1.8 09/02/2013   BUN 11 09/07/2016   BUN 8 05/14/2016   BUN 11 09/04/2013   CREATININE 0.76 09/07/2016   CREATININE 0.81 09/04/2013    Past medical and surgical history were reviewed and updated in EPIC.  Outpatient Encounter Prescriptions as of 09/23/2016  Medication Sig  . aspirin EC 325 MG tablet Take 1 tablet (325 mg total) by mouth daily.  Marland Kitchen. atorvastatin (LIPITOR) 40 MG tablet Take 1 tablet (40 mg total) by mouth daily at 6 PM.  . buPROPion (WELLBUTRIN SR) 150 MG 12 hr tablet 1 tab daily for 1 week, then 1 tab BID, pick a day in the 2nd or 3rd week to quit smoking.  Marland Kitchen. levothyroxine (SYNTHROID, LEVOTHROID) 137 MCG tablet Take 1 tablet (137 mcg total) by mouth daily before breakfast.   No facility-administered encounter medications on file as of 09/23/2016.     Allergies: Patient has no known allergies.  Social History   Social History  . Marital status: Divorced    Spouse name: N/A  . Number of children: 2  . Years of education:  12   Occupational History  . [employer]    Social History Main Topics  . Smoking status: Current Every Day Smoker    Packs/day: 0.25    Types: Cigarettes  . Smokeless tobacco: Former Neurosurgeon    Types: Chew     Comment: trying to quit; going to start using patches;   . Alcohol use 0.0 oz/week     Comment: on occasion  . Drug use: No     Comment: Previously, but stopped years ago  . Sexual activity: Yes     Partners: Male    Birth control/ protection: None, Condom   Other Topics Concern  . Not on file   Social History Narrative   Lives with daughter   Caffeine use: daily   Right handed    Family History  Problem Relation Age of Onset  . Stroke Father     Review of Systems: A 12-system review of systems was performed and was negative except as noted in the HPI.  --------------------------------------------------------------------------------------------------  Physical Exam: BP 100/80 (BP Location: Right Arm, Patient Position: Sitting, Cuff Size: Normal)   Pulse 90   Ht 5\' 5"  (1.651 m)   Wt 212 lb 4 oz (96.3 kg)   BMI 35.32 kg/m   General:  Obese woman, seated comfortably in the exam room. She is accompanied by her daughter. HEENT: No conjunctival pallor or scleral icterus.  Moist mucous membranes.  OP clear. Neck: Supple without lymphadenopathy, thyromegaly, JVD, or HJR.  No carotid bruit. Lungs: Normal work of breathing.  Clear to auscultation bilaterally without wheezes or crackles. Heart: Regular rate and rhythm without murmurs, rubs, or gallops.  Non-displaced PMI. Abd: Bowel sounds present.  Soft, NT/ND without hepatosplenomegaly Ext: No lower extremity edema.  Radial, PT, and DP pulses are 2+ bilaterally. Skin: warm and dry without rash  EKG:  Normal sinus rhythm with possible left atrial enlargement. No significant abnormalities.  Lab Results  Component Value Date   WBC 7.4 09/07/2016   HGB 12.1 09/07/2016   HCT 36.3 09/07/2016   MCV 83.5 09/07/2016   PLT 338 09/07/2016    Lab Results  Component Value Date   NA 139 09/07/2016   K 3.9 09/07/2016   CL 111 09/07/2016   CO2 21 (L) 09/07/2016   BUN 11 09/07/2016   CREATININE 0.76 09/07/2016   GLUCOSE 151 (H) 09/07/2016   ALT 13 (L) 09/06/2016    Lab Results  Component Value Date   CHOL 179 09/07/2016   HDL 35 (L) 09/07/2016   LDLCALC 133 (H) 09/07/2016   TRIG 57 09/07/2016   CHOLHDL 5.1 09/07/2016      --------------------------------------------------------------------------------------------------  ASSESSMENT AND PLAN: Cryptogenic stroke and possible PFO [name] has recovered from her recent stroke without residual or new focal neurologic deficits. She is tolerating aspirin and statin therapy well. Her blood pressure is well controlled today. Workup during her recent hospitalization was notable for possible tiny PFO versus ASD by bubble study, though neither was clearly visualized on the TEE. I have spoken with the patient about the implications of intracardiac shunt, including continued antiplatelet therapy and the role for shunt closure. We have agreed to obtain a transcranial Doppler with bubble study to reassess the potential for right to left shunting. Given that she accidentally threw away her event monitor, we will reorder a 30-day event monitor today to screen for possible arrhythmogenic cause of her stroke. She should continue her current medications. I will defer repeating an LDL to  her PCP (goal less than 70).  Follow-up: Return to clinic in 3 months.  [name], MD 09/23/2016 8:46 PM

## 2016-09-23 NOTE — Patient Instructions (Addendum)
Medication Instructions:  Your physician recommends that you continue on your current medications as directed. Please refer to the Current Medication list given to you today.   Labwork: none  Testing/Procedures: Your physician has recommended that you wear an 30 day event monitor. Event monitors are medical devices that record the heart's electrical activity. Doctors most often us these monitors to diagnose arrhythmias. Arrhythmias are problems with the speed or rhythm of the heartbeat. The monitor is a small, portable device. You can wear one while you do your normal daily activities. This is usually used to diagnose what is causing palpitations/syncope (passing out). - You will be mailed a monitor from [employer].  - They will call you in the next day or so to verify your address. Then is will take 5-7 days to be mailed to you. - You will wear for 30 days and then place all the pieces of equipment that came with the device back in the provided box and take it to your nearest UPS drop off locations. - Call [employer] at [phone], if you have any questions concerning the monitor once you have received it. - DO NOT GET THE MONITOR WET.      Your physician has recommended you have Transcanial doppler with Bubble study.  - I will be in the process of scheduling and I will call you with the details.    Follow-Up: Your physician recommends that you schedule a follow-up appointment in: 3 MONTHS WITH DR END.    If you need a refill on your cardiac medications before your next appointment, please call your pharmacy.

## 2016-09-24 ENCOUNTER — Telehealth: Payer: Self-pay | Admitting: *Deleted

## 2016-09-24 ENCOUNTER — Other Ambulatory Visit: Payer: Self-pay | Admitting: *Deleted

## 2016-09-24 DIAGNOSIS — I639 Cerebral infarction, unspecified: Secondary | ICD-10-CM

## 2016-09-24 NOTE — Therapy (Signed)
[facility] MAIN [facility] SERVICES [address] [place], [place], [address] Phone: [phone]   Fax:  [phone]  Occupational Therapy Evaluation  Patient Details  Name: Margaret [name] MRN: [id number] Date of Birth: December 20, 1970 Referring Provider: Olevia Perches  Encounter Date: 09/22/2016      OT End of Session - 09/24/16 1133    Visit Number 1   Number of Visits 12   Date for OT Re-Evaluation 12/15/16   Authorization Type Medicaid visit 1   OT Start Time 0903   OT Stop Time 0950   OT Time Calculation (min) 47 min   Activity Tolerance Patient limited by lethargy   Behavior During Therapy Flat affect      Past Medical History:  Diagnosis Date  . Asthma    controlled  . CVA (cerebral vascular accident) (HCC)   . Thyroid disease     Past Surgical History:  Procedure Laterality Date  . FRACTURE SURGERY    . TEE WITHOUT CARDIOVERSION N/A 09/08/2016   Procedure: TRANSESOPHAGEAL ECHOCARDIOGRAM (TEE);  Surgeon: [name], MD;  Location: [facility] ORS;  Service: Cardiovascular;  Laterality: N/A;    There were no vitals filed for this visit.      Subjective Assessment - 09/24/16 1404    Subjective  Patient reports she had a stroke on 09/05/16 she reported numbness and weakness in her left UE and LE.  She is walking without any assistive devices.     Pertinent History Patient is a 46 yo female who suffered a CVA on 09/06/16 with a brief hospitalization from 09/06/16 to 09/08/16.  She was discharged home and now referred for OP OT.  She has a history of torn ligaments in her left knee and now presents with weakness in her left arm and left LE affecting her daily routine.  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    Currently in Pain? No/denies   Pain Score 0-No pain            [facility] OT Assessment - 09/24/16 1135      Assessment   Diagnosis CVA   Referring Provider Olevia Perches   Onset Date 09/06/16   Prior Therapy PT     Precautions   Precautions None     Restrictions   Weight Bearing Restrictions No     Balance Screen   Has the patient fallen in the past 6 months No   Has the patient had a decrease in activity level because of a fear of falling?  No   Is the patient reluctant to leave their home because of a fear of falling?  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     ADL  Eating/Feeding Independent   Grooming Independent   Upper Body Bathing Minimal assistance   Lower Body Bathing Modified independent   Lower Body Dressing Modified independent   Toilet Transfer Modified independent   Toileting - Clothing Manipulation Modified independent   Tub/Shower Transfer Minimal assistance  difficulty with transferring out of the tub   ADL comments Patient reports increased time to complete all self care tasks at home, she has difficulty with getting out of the tub, difficulty with bathing using left arm at times and difficulty remembering to take her medications at the correct times. She demonstrates difficulty with higher level home management tasks such as sweeping, mopping, vacuuming and performing home maintenance tasks.      IADL   Prior Level of Function Shopping independent   Shopping Takes care of all shopping needs independently   Prior Level of Function Light  Housekeeping independent   Light Housekeeping Performs light daily tasks such as dishwashing, bed making   Prior Level of Function Meal Prep independent   Meal Prep Able to complete simple warm meal prep   Community Mobility Drives own vehicle   Prior Level of Function Meal Prep independent   Medication Management Has difficulty remembering to take medication   Prior Level of Function Financial Management independent   Development worker, community financial matters independently (budgets, writes checks, pays rent, bills goes to bank), collects and keeps track of income     Mobility   Mobility Status Independent   Mobility Status Comments no assistive device     Written Expression   Dominant Hand Right     Vision - History   Baseline Vision Wears glasses for distance only   Additional Comments denies any changes in vision     Cognition   Overall Cognitive Status Impaired/Different from baseline   Memory Impaired     Sensation   Light Touch Appears Intact   Stereognosis Appears Intact   Hot/Cold Appears Intact   Proprioception Appears Intact     Coordination   Gross Motor Movements are Fluid and Coordinated Yes   Fine Motor Movements are Fluid and Coordinated No   Finger Nose Finger Test slightly impaired on left   9 Hole Peg Test Right;Left   Right 9 Hole Peg Test 22   Left 9 Hole Peg Test 25   Other coordination impaired with rapid alternating movements.     ROM / Strength   AROM / PROM / Strength AROM;Strength     AROM   Overall AROM  Deficits   Overall AROM Comments left shoulder flexion to 130 degrees, ABD 125 degrees.     Strength   Overall Strength Deficits   Overall Strength Comments LUE 3/5, RUE 4/5, significant decrease in bilateral grip strength for age   Right Hand Grip (lbs) 21   Left Hand Grip (lbs) 5     Hand Function   Right Hand Grip (lbs) 21   Right Hand Lateral Pinch 8 lbs   Right Hand 3 Point Pinch 7 lbs   Left Hand Grip (lbs) 5   Left Hand  Lateral Pinch 6 lbs   Left 3 point pinch 5 lbs                         OT Education - 09/24/16 1132    Education provided Yes   Education Details goals for OT, plan of care, recommendations for therapy   Person(s) Educated Patient   Methods Explanation  Comprehension Verbalized understanding             OT Long Term Goals - 09/24/16 1358      OT LONG TERM GOAL #1   Title Patient will demonstrate increased strength in left upper extremity by one manual muscle grade to lift heavy pots and pans with modified independence.     Baseline difficulty with larger pots and pans, only 5# of grip in left UE   Time 12   Period Weeks   Status New     OT LONG TERM GOAL #2   Title Patient will demonstrate an increase in left grip strength 10 pounds to be able to open jars in containers independently   Baseline difficulty with opening containers especially new jars.   Time 12   Period Weeks   Status New     OT LONG TERM GOAL #3   Title Patient will perform higher level homemaking tasks such as mopping sweeping and vacuuming with modified independence.   Baseline unable at time of evaluation, max assist   Time 12   Period Weeks   Status New     OT LONG TERM GOAL #4   Title Patient will complete tub transfer with modified independence safely.    Baseline min assist, at risk for falls with transfer   Time 12   Period Weeks   Status New     OT LONG TERM GOAL #5   Title Patient will demonstrate ability to complete home exercise program with modified independence for strength and coordination for left upper extremity.   Baseline no current home program   Time 12   Period Weeks   Status New               Plan - 09/24/16 1352    Clinical Impression Statement Patient is a 46 year old female who suffered a CVA on 09-06-2016 and was hospitalized from September 06, 2016 to September 08, 2016. She lives in a one story apartment with her nine-year-old daughter whom she has to  care for. She works part time as a Lawyer. She was limited previously and her work tasks due to torn ligaments in her knee. She reports muscle weakness and decreased ability to perform her daily tasks at home and work. She has difficulty getting out of the tub, washing her body with her left hand, mopping , sweeping, vacuuming and maintaining the appropriate level of cleanliness at home. She demonstrates significant decreased grip strength on the left which affects her ability to pick up and lift items as well as performing kitchen tasks such as opening jars/containers and lifting heavy pots and pans containing food. She would benefit from skilled occupational therapy to increase her independence in her basic self-care tasks and homemaking tasks as well as return to work tasks.   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   Occupational performance deficits (Please refer to evaluation for details): ADL's;IADL's;Work;Leisure   Rehab Potential Excellent   Current Impairments/barriers affecting progress: flat affect, no support in the home other than 41 yo daughter, motivation   OT Frequency 1x / week   OT Duration 12 weeks   OT Treatment/Interventions Self-care/ADL training;Moist Heat;DME and/or AE instruction;Patient/family education;Therapeutic exercises;Therapeutic exercise;Therapeutic activities;Neuromuscular education;Functional Mobility Training;Manual Therapy   Plan Recommended 2 times a week for therapy however patient feels she can only do 1x a week at this point.   Clinical Decision  Making Several treatment options, min-mod task modification necessary   OT Home Exercise Plan currently has no home program   Consulted and Agree with Plan of Care Patient      Patient will benefit from skilled therapeutic intervention in order to improve the following deficits and impairments:  Decreased  coordination, Decreased range of motion, Decreased endurance, Decreased activity tolerance, Impaired UE functional use, Decreased cognition, Decreased strength  Visit Diagnosis: Muscle weakness (generalized)  Other lack of coordination    Problem List Patient Active Problem List   Diagnosis Date Noted  . Cryptogenic stroke (HCC) 09/23/2016  . PFO (patent foramen ovale) 09/23/2016  . Hyperlipidemia 09/17/2016  . IFG (impaired fasting glucose) 09/17/2016  . History of stroke 09/06/2016  . Myopia 12/20/2014  . Abdominal pain 12/20/2014  . Depression 12/20/2014  . Right arm pain 11/21/2014  . Thyroid activity decreased 10/18/2014  . Abnormal CBC 10/18/2014  . Tobacco abuse 10/18/2014   Jyair Kiraly T Arne ClevelandLovett, OTR/L, CLT  Margaret [name] 09/24/2016, 2:04 PM  [place] [facility] MAIN [facility] [address] [place], [place], [address] Phone: [phone]   Fax:  [phone]  Name: Margaret [name] MRN: [id number] Date of Birth: 12/16/1970

## 2016-09-24 NOTE — Therapy (Deleted)
[place] [facility] MAIN [facility] SERVICES [address] [place], [place], [address] Phone: [phone]   Fax:  [phone]  Occupational Therapy Treatment  Patient Details  Name: Margaret [name] MRN: [id number] Date of Birth: 1971-01-29 Referring Provider: Olevia Perches  Encounter Date: 09/22/2016      OT End of Session - 09/24/16 1133    Visit Number 1   Number of Visits 12   Date for OT Re-Evaluation 12/15/16   Authorization Type Medicaid visit 1   OT Start Time 0903   OT Stop Time 0950   OT Time Calculation (min) 47 min   Activity Tolerance Patient limited by lethargy   Behavior During Therapy Flat affect      Past Medical History:  Diagnosis Date  . Asthma    controlled  . CVA (cerebral vascular accident) (HCC)   . Thyroid disease     Past Surgical History:  Procedure Laterality Date  . FRACTURE SURGERY    . TEE WITHOUT CARDIOVERSION N/A 09/08/2016   Procedure: TRANSESOPHAGEAL ECHOCARDIOGRAM (TEE);  Surgeon: [name], MD;  Location: [facility] ORS;  Service: Cardiovascular;  Laterality: N/A;    There were no vitals filed for this visit.          [facility] OT Assessment - 09/24/16 1135      Assessment   Diagnosis CVA   Referring Provider Olevia Perches   Onset Date 09/06/16   Prior Therapy PT     Precautions   Precautions None     Restrictions   Weight Bearing Restrictions No     Balance Screen   Has the patient fallen in the past 6 months No   Has the patient had a decrease in activity level because of a fear of falling?  No   Is the patient reluctant to leave their home because of a fear of falling?  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     ADL   Eating/Feeding Independent   Grooming Independent   Upper Body Bathing Minimal assistance   Lower Body Bathing Modified independent   Lower Body Dressing Modified independent   Toilet Transfer Modified independent   Toileting - Clothing Manipulation Modified independent   Tub/Shower Transfer Minimal assistance  difficulty with transferring out of the tub   ADL comments Patient reports increased time to complete all self care tasks at home, she has difficulty with getting out of the tub, difficulty with bathing using left arm at times and difficulty remembering to take her medications at the correct times. She demonstrates difficulty with higher level home management tasks such as sweeping, mopping, vacuuming and performing home maintenance tasks.      IADL   Prior Level of Function Shopping independent   Shopping Takes care of all shopping needs independently   Prior Level of Function Light Housekeeping independent   Light Housekeeping Performs light daily tasks such as dishwashing, bed making  Prior Level of Function Meal Prep independent   Meal Prep Able to complete simple warm meal prep   Community Mobility Drives own vehicle   Prior Level of Function Meal Prep independent   Medication Management Has difficulty remembering to take medication   Prior Level of Function Financial Management independent   Development worker, community financial matters independently (budgets, writes checks, pays rent, bills goes to bank), collects and keeps track of income     Mobility   Mobility Status Independent   Mobility Status Comments no assistive device     Written Expression   Dominant Hand Right     Vision - History   Baseline Vision Wears glasses for distance only    Additional Comments denies any changes in vision     Cognition   Overall Cognitive Status Impaired/Different from baseline   Memory Impaired     Sensation   Light Touch Appears Intact   Stereognosis Appears Intact   Hot/Cold Appears Intact   Proprioception Appears Intact     Coordination   Gross Motor Movements are Fluid and Coordinated Yes   Fine Motor Movements are Fluid and Coordinated No   Finger Nose Finger Test slightly impaired on left   9 Hole Peg Test Right;Left   Right 9 Hole Peg Test 22   Left 9 Hole Peg Test 25   Other coordination impaired with rapid alternating movements.     ROM / Strength   AROM / PROM / Strength AROM;Strength     AROM   Overall AROM  Deficits   Overall AROM Comments left shoulder flexion to 130 degrees, ABD 125 degrees.     Strength   Overall Strength Deficits   Overall Strength Comments LUE 3/5, RUE 4/5, significant decrease in bilateral grip strength for age   Right Hand Grip (lbs) 21   Left Hand Grip (lbs) 5     Hand Function   Right Hand Grip (lbs) 21   Right Hand Lateral Pinch 8 lbs   Right Hand 3 Point Pinch 7 lbs   Left Hand Grip (lbs) 5   Left Hand Lateral Pinch 6 lbs   Left 3 point pinch 5 lbs                          OT Education - 09/24/16 1132    Education provided Yes   Education Details goals for OT, plan of care, recommendations for therapy   Person(s) Educated Patient   Methods Explanation   Comprehension Verbalized understanding             OT Long Term Goals - 09/24/16 1358      OT LONG TERM GOAL #1   Title Patient will demonstrate increased strength in left upper extremity by one manual muscle grade to lift heavy pots and pans with modified independence.     Baseline difficulty with larger pots and pans, only 5# of grip in left UE   Time 12   Period Weeks   Status New     OT LONG TERM GOAL #2   Title Patient will demonstrate an increase in left grip strength 10 pounds to be  able to open jars in containers independently   Baseline difficulty with opening containers especially new jars.   Time 12   Period Weeks   Status New     OT LONG TERM GOAL #3   Title Patient will perform higher level homemaking tasks such as mopping  sweeping and vacuuming with modified independence.   Baseline unable at time of evaluation, max assist   Time 12   Period Weeks   Status New     OT LONG TERM GOAL #4   Title Patient will complete tub transfer with modified independence safely.    Baseline min assist, at risk for falls with transfer   Time 12   Period Weeks   Status New     OT LONG TERM GOAL #5   Title Patient will demonstrate ability to complete home exercise program with modified independence for strength and coordination for left upper extremity.   Baseline no current home program   Time 12   Period Weeks   Status New               Plan - 09/24/16 1352    Clinical Impression Statement Patient is a 46 year old female who suffered a CVA on 09-06-2016 and was hospitalized from September 06, 2016 to September 08, 2016. She lives in a one story apartment with her nine-year-old daughter whom she has to care for. She works part time as a Lawyer. She was limited previously and her work tasks due to torn ligaments in her knee. She reports muscle weakness and decreased ability to perform her daily tasks at home and work. She has difficulty getting out of the tub, washing her body with her left hand, mopping , sweeping, vacuuming and maintaining the appropriate level of cleanliness at home. She demonstrates significant decreased grip strength on the left which affects her ability to pick up and lift items as well as performing kitchen tasks such as opening jars/containers and lifting heavy pots and pans containing food. She would benefit from skilled occupational therapy to increase her independence in her basic self-care tasks and homemaking tasks as well as return to work tasks.    Occupational Profile and client history currently impacting functional performance previous knee injury, works part time, is a sole caregiver to 75 yo child   Occupational performance deficits (Please refer to evaluation for details): ADL's;IADL's;Work;Leisure   Rehab Potential Excellent   Current Impairments/barriers affecting progress: flat affect, no support in the home other than 75 yo daughter, motivation   OT Frequency 1x / week   OT Duration 12 weeks   OT Treatment/Interventions Self-care/ADL training;Moist Heat;DME and/or AE instruction;Patient/family education;Therapeutic exercises;Therapeutic exercise;Therapeutic activities;Neuromuscular education;Functional Mobility Training;Manual Therapy   Plan Recommended 2 times a week for therapy however patient feels she can only do 1x a week at this point.   Clinical Decision Making Several treatment options, min-mod task modification necessary   OT Home Exercise Plan currently has no home program   Consulted and Agree with Plan of Care Patient      Patient will benefit from skilled therapeutic intervention in order to improve the following deficits and impairments:  Decreased coordination, Decreased range of motion, Decreased endurance, Decreased activity tolerance, Impaired UE functional use, Decreased cognition, Decreased strength  Visit Diagnosis: Muscle weakness (generalized)  Other lack of coordination    Problem List Patient Active Problem List   Diagnosis Date Noted  . Cryptogenic stroke (HCC) 09/23/2016  . PFO (patent foramen ovale) 09/23/2016  . Hyperlipidemia 09/17/2016  . IFG (impaired fasting glucose) 09/17/2016  . History of stroke 09/06/2016  . Myopia 12/20/2014  . Abdominal pain 12/20/2014  . Depression 12/20/2014  . Right arm pain 11/21/2014  . Thyroid activity decreased 10/18/2014  . Abnormal CBC 10/18/2014  . Tobacco abuse 10/18/2014    Lovett,Amy  09/24/2016, 2:02 PM  [facility] [address] [place], [place], [address] Phone: [phone]   Fax:  [phone]  Name: Margaret [name] MRN: [id number] Date of Birth: 11/24/1970

## 2016-09-24 NOTE — Telephone Encounter (Signed)
Spoke with Annabelle Harmanana. She will call patient and schedule the TCD with Bubble study. Order for Transcranial doppler with Bubble study entered.

## 2016-09-29 ENCOUNTER — Ambulatory Visit: Payer: Medicaid Other | Admitting: Occupational Therapy

## 2016-09-29 ENCOUNTER — Ambulatory Visit: Payer: Medicaid Other | Admitting: Physical Therapy

## 2016-10-02 ENCOUNTER — Telehealth: Payer: Self-pay | Admitting: Student

## 2016-10-02 ENCOUNTER — Encounter: Payer: Self-pay | Admitting: Internal Medicine

## 2016-10-02 ENCOUNTER — Telehealth: Payer: Self-pay | Admitting: Internal Medicine

## 2016-10-02 NOTE — Telephone Encounter (Signed)
[facility] event [facility]. Monitor showe3d 30 seconds of atrial fibrillation with rapid ventricular response up to 200-210 bpm. At end of transmission, patient was in sinus tachycardia at a rate of 140 bpm. After event, error message being received that monitor cannot record because it is out of range of the patient.  [employer] attempted to contact patient but was unable to reach her. I attempted to contact her at both her home and mobile numbers. I left instructions for callback on her home number.  Jodelle RedBridgette Bailen Geffre, overnight cardiology provider

## 2016-10-02 NOTE — Telephone Encounter (Signed)
  Received a call from [employer] that the patient had an episode of narrow-complex tachycardia earlier this evening with HR peaking into the 200's. The episode lasted for 30 seconds then returned to a sinus tachycardia then sinus rhythm. No clear evidence of atrial fibrillation noted per the report. I have asked them to fax the sheets to our office for review.   I attempted to contact the patient but her phone went to voicemail. She is now in a NSR according to [employer]. Will make Dr. Okey DupreEnd aware.   Signed, Ellsworth LennoxBrittany M Chakira Jachim, PA-C 10/02/2016, 7:39 PM Pager: [phone]

## 2016-10-04 ENCOUNTER — Emergency Department
Admission: EM | Admit: 2016-10-04 | Discharge: 2016-10-04 | Disposition: A | Discharge status: Home / Self Care | Payer: Medicaid Other | Attending: Emergency Medicine | Admitting: Emergency Medicine

## 2016-10-04 ENCOUNTER — Encounter: Payer: Self-pay | Admitting: Emergency Medicine

## 2016-10-04 DIAGNOSIS — J45909 Unspecified asthma, uncomplicated: Secondary | ICD-10-CM | POA: Insufficient documentation

## 2016-10-04 DIAGNOSIS — I480 Paroxysmal atrial fibrillation: Secondary | ICD-10-CM | POA: Diagnosis not present

## 2016-10-04 DIAGNOSIS — R42 Dizziness and giddiness: Secondary | ICD-10-CM

## 2016-10-04 DIAGNOSIS — F17210 Nicotine dependence, cigarettes, uncomplicated: Secondary | ICD-10-CM | POA: Diagnosis not present

## 2016-10-04 LAB — URINALYSIS, COMPLETE (UACMP) WITH MICROSCOPIC
BACTERIA UA: NONE SEEN
Bilirubin Urine: NEGATIVE
GLUCOSE, UA: NEGATIVE mg/dL
HGB URINE DIPSTICK: NEGATIVE
Ketones, ur: NEGATIVE mg/dL
Leukocytes, UA: NEGATIVE
NITRITE: NEGATIVE
PH: 5 (ref 5.0–8.0)
Protein, ur: NEGATIVE mg/dL
SPECIFIC GRAVITY, URINE: 1.027 (ref 1.005–1.030)

## 2016-10-04 LAB — CBC
HCT: 37 % (ref 35.0–47.0)
Hemoglobin: 12.4 g/dL (ref 12.0–16.0)
MCH: 27.9 pg (ref 26.0–34.0)
MCHC: 33.5 g/dL (ref 32.0–36.0)
MCV: 83.2 fL (ref 80.0–100.0)
PLATELETS: 333 10*3/uL (ref 150–440)
RBC: 4.45 MIL/uL (ref 3.80–5.20)
RDW: 14.6 % — AB (ref 11.5–14.5)
WBC: 12.2 10*3/uL — AB (ref 3.6–11.0)

## 2016-10-04 LAB — BASIC METABOLIC PANEL
Anion gap: 4 — ABNORMAL LOW (ref 5–15)
BUN: 12 mg/dL (ref 6–20)
CALCIUM: 9.2 mg/dL (ref 8.9–10.3)
CO2: 27 mmol/L (ref 22–32)
CREATININE: 0.89 mg/dL (ref 0.44–1.00)
Chloride: 108 mmol/L (ref 101–111)
GFR calc Af Amer: >60 mL/min (ref >60)
Glucose, Bld: 94 mg/dL (ref 65–99)
Potassium: 3.6 mmol/L (ref 3.5–5.1)
SODIUM: 139 mmol/L (ref 135–145)

## 2016-10-04 LAB — T4, FREE: FREE T4: 1.27 ng/dL — AB (ref 0.61–1.12)

## 2016-10-04 LAB — TSH: TSH: 0.768 u[IU]/mL (ref 0.350–4.500)

## 2016-10-04 MED ORDER — METOPROLOL TARTRATE 25 MG PO TABS
12.5000 mg | ORAL_TABLET | Freq: Once | ORAL | Status: AC
  Administered 2016-10-04: 12.5 mg via ORAL
  Filled 2016-10-04: qty 1

## 2016-10-04 MED ORDER — METOPROLOL TARTRATE 25 MG PO TABS: 25.0000 mg | ORAL_TABLET | Freq: Two times a day (BID) | ORAL | 11 refills | Status: DC

## 2016-10-04 MED ORDER — METOPROLOL TARTRATE 25 MG PO TABS: 25.0000 mg | ORAL_TABLET | Freq: Once | ORAL | Status: DC

## 2016-10-04 NOTE — ED Provider Notes (Signed)
[facility] Emergency Department Provider Note       Time seen: ----------------------------------------- 9:27 PM on 10/04/2016 -----------------------------------------     I have reviewed the triage vital signs and the nursing notes.   HISTORY   Chief Complaint Fatigue    HPI Margaret [name] is a 46 y.o. female who presents to the ED for feelings of dizziness and palpitations while she was at work on Friday. Patient states she was recently diagnosed with a "mini stroke". She has an outpatient CT scan scheduled for tomorrow. Patient states today she is feeling tired but the fatigue has improved over the weekend with rest. She denies any recent illness or other complaints at this time.   Past Medical History:  Diagnosis Date  . Asthma    controlled  . CVA (cerebral vascular accident) (HCC)   . Thyroid disease     Patient Active Problem List   Diagnosis Date Noted  . Cryptogenic stroke (HCC) 09/23/2016  . PFO (patent foramen ovale) 09/23/2016  . Hyperlipidemia 09/17/2016  . IFG (impaired fasting glucose) 09/17/2016  . History of stroke 09/06/2016  . Myopia 12/20/2014  . Abdominal pain 12/20/2014  . Depression 12/20/2014  . Right arm pain 11/21/2014  . Thyroid activity decreased 10/18/2014  . Abnormal CBC 10/18/2014  . Tobacco abuse 10/18/2014    Past Surgical History:  Procedure Laterality Date  . FRACTURE SURGERY    . TEE WITHOUT CARDIOVERSION N/A 09/08/2016   Procedure: TRANSESOPHAGEAL ECHOCARDIOGRAM (TEE);  Surgeon: [name], MD;  Location: [facility] ORS;  Service: Cardiovascular;  Laterality: N/A;    Allergies Patient has no known allergies.  Social History Social History  Substance Use Topics  . Smoking status: Current Every Day Smoker    Packs/day: 0.25    Types: Cigarettes  . Smokeless tobacco: Former Neurosurgeon    Types: Chew     Comment: trying to quit; going to start using patches;   . Alcohol use 0.0 oz/week   Comment: on occasion    Review of Systems Constitutional: Negative for fever. Eyes: Negative for vision changes ENT:  Negative for congestion, sore throat Cardiovascular: Negative for chest pain.Positive for palpitations Respiratory: Negative for shortness of breath. Gastrointestinal: Negative for abdominal pain, vomiting and diarrhea. Genitourinary: Negative for dysuria. Musculoskeletal: Negative for back pain. Skin: Negative for rash. Neurological: Positive for generalized weakness  All systems negative/normal/unremarkable except as stated in the HPI  ____________________________________________   PHYSICAL EXAM:  VITAL SIGNS: ED Triage Vitals  Enc Vitals Group     BP 10/04/16 1834 111/81     Pulse Rate 10/04/16 1834 92     Resp 10/04/16 1834 16     Temp 10/04/16 1834 98.3 F (36.8 C)     Temp src --      SpO2 10/04/16 1834 100 %     Weight 10/04/16 1835 213 lb (96.6 kg)     Height 10/04/16 1835 5\' 5"  (1.651 m)     Head Circumference --      Peak Flow --      Pain Score 10/04/16 1834 0     Pain Loc --      Pain Edu? --      Excl. in GC? --    Constitutional: Alert and oriented. Well appearing and in no distress. Eyes: Conjunctivae are normal. Normal extraocular movements. ENT   Head: Normocephalic and atraumatic.   Nose: No congestion/rhinnorhea.   Mouth/Throat: Mucous membranes are moist.   Neck: No stridor. Cardiovascular: Normal  rate, regular rhythm. No murmurs, rubs, or gallops. Respiratory: Normal respiratory effort without tachypnea nor retractions. Breath sounds are clear and equal bilaterally. No wheezes/rales/rhonchi. Gastrointestinal: Soft and nontender. Normal bowel sounds Musculoskeletal: Nontender with normal range of motion in extremities. No lower extremity tenderness nor edema. Neurologic:  Normal speech and language. No gross focal neurologic deficits are appreciated.  Skin:  Skin is warm, dry and intact. No rash noted. Psychiatric:  Mood and affect are normal. Speech and behavior are normal.  ____________________________________________  EKG: Interpreted by me.Sinus rhythm rate 86 bpm, normal PR interval, normal QRS, normal QT.  ____________________________________________  ED COURSE:  Pertinent labs & imaging results that were available during my care of the patient were reviewed by me and considered in my medical decision making (see chart for details). Patient presents for weakness and palpitations, we will assess with labs and imaging as indicated.   Procedures ____________________________________________   LABS (pertinent positives/negatives)  Labs Reviewed  BASIC METABOLIC PANEL - Abnormal; Notable for the following:       Result Value   Anion gap 4 (*)    All other components within normal limits  CBC - Abnormal; Notable for the following:    WBC 12.2 (*)    RDW 14.6 (*)    All other components within normal limits  URINALYSIS, COMPLETE (UACMP) WITH MICROSCOPIC - Abnormal; Notable for the following:    Color, Urine YELLOW (*)    APPearance HAZY (*)    Squamous Epithelial / LPF 0-5 (*)    All other components within normal limits  T4, FREE - Abnormal; Notable for the following:    Free T4 1.27 (*)    All other components within normal limits  TSH  CBG MONITORING, ED   ___________________________________________  FINAL ASSESSMENT AND PLAN  Dizziness, Paroxysmal atrial fibrillation  Plan: Patient's labs and imaging were dictated above. Patient had presented for Dizziness and possibly palpitations. She is currently wearing a Holter monitor which captured an event of atrial fibrillation with a rapid ventricular response of 200-210 bpm. Patient states she is aware that this event occurred, she is currently taking aspirin. I will place her on metoprolol sharp prevent this tachycardia. She is stable for outpatient follow-up.   Emily FilbertWilliams, Margaret Boss E, MD   Note: This note was generated in part or  whole with voice recognition software. Voice recognition is usually quite accurate but there are transcription errors that can and very often do occur. I apologize for any typographical errors that were not detected and corrected.     Emily FilbertWilliams, Margaret Stirn E, MD 10/04/16 2257

## 2016-10-04 NOTE — ED Triage Notes (Signed)
States went to work on Friday and had an episode seeing spots and dizzy.  Nurse at work took patient's pulse and stated heart rate was "fast".  Patient states she was recently diagnosed with "mini stroke", has outpatient CT scan scheduled for tomorrow.  Today patient states she is feeling tired, but fatigue has improved over the week end with rest.  Patient is AAOx3.  Skin warm and dry.  MAE equally and strong.  Ambulates with easy and steady gait.  NAD.

## 2016-10-05 ENCOUNTER — Ambulatory Visit (HOSPITAL_COMMUNITY)
Admission: RE | Admit: 2016-10-05 | Discharge: 2016-10-05 | Disposition: A | Discharge status: Home / Self Care | Payer: Medicaid Other | Source: Ambulatory Visit | Attending: Internal Medicine | Admitting: Internal Medicine

## 2016-10-05 DIAGNOSIS — I639 Cerebral infarction, unspecified: Secondary | ICD-10-CM | POA: Diagnosis present

## 2016-10-05 NOTE — Progress Notes (Signed)
*  PRELIMINARY RESULTS* Vascular Ultrasound Transcranial Doppler with Bubbles has been completed with Dr. Roda ShuttersXu. High intensity transient signals (HITS) were heard with Valsalva, suggestive of small patent foramen ovale (PFO).  10/05/2016 4:17 PM Gertie FeyMichelle Aayat Hajjar, BS, RVT, RDCS, RDMS

## 2016-10-06 ENCOUNTER — Telehealth: Payer: Self-pay | Admitting: Internal Medicine

## 2016-10-06 ENCOUNTER — Ambulatory Visit: Payer: Medicaid Other | Admitting: Occupational Therapy

## 2016-10-06 ENCOUNTER — Ambulatory Visit: Payer: Medicaid Other | Admitting: Podiatry

## 2016-10-06 ENCOUNTER — Ambulatory Visit: Payer: Medicaid Other | Admitting: Physical Therapy

## 2016-10-06 NOTE — Telephone Encounter (Signed)
Spoke with the patient regarding results of transcranial Doppler as well as event monitor report from over the weekend showing tachycardia concerning for a-fib with RVR. Patient was seen in the ED 2 days ago due to palpitations and was started on metoprolol. She is currently on ASA 325 mg daily following her stroke last month.  We discussed the finding of atrial fibrillation, as well as my recommendation of beginning anticoagulation given her CHADSVASC score of 3 (stroke x 2, female). We became disconnected during the call, and I was unable to reach her again at both numbers listed in the system. I left voicemail, asking the patient to call me back as soon as possible. I favor adding NOAC in place of aspirin, given cryptogenic stroke and finding of PAF.  Yvonne Kendallhristopher Chade Pitner, MD [facility] Pager: [phone]

## 2016-10-08 ENCOUNTER — Other Ambulatory Visit: Payer: Self-pay | Admitting: *Deleted

## 2016-10-08 MED ORDER — RIVAROXABAN 20 MG PO TABS: 20.0000 mg | ORAL_TABLET | Freq: Every day | ORAL | 6 refills | Status: DC

## 2016-10-08 NOTE — Telephone Encounter (Signed)
I spoke with Margaret [name] again about her monitor results, demonstrated PAF with RVR. We discussed risk for thromboembolic events and have agreed to initiate NOAC therapy. We will begin rivaroxaban 20 mg daily and discontinue ASA. She should continue her other medications, including metoprolol. She has other concerns and would like to see me in the office. We will try to add her to my clinic in the next few weeks.

## 2016-10-08 NOTE — Telephone Encounter (Signed)
Left message to call back  

## 2016-10-08 NOTE — Telephone Encounter (Signed)
Patient called back and states that she can be reached at [phone] around 2:30 PM today. Patient states that she is very concerned and just wants to see what can be done. Let her know that I would pass this on to Dr. Okey DupreEnd.

## 2016-10-08 NOTE — Telephone Encounter (Signed)
Spoke with patient and reviewed Dr. Serita KyleEnd's recommendations. Prescription sent in and samples with discount card placed up front for her to pick up.   Medication Samples have been provided to the patient.  Drug name: Xarelto       Strength: 20 mg        Qty: 4 boxes  LOT: [id number]  Exp.Date: 12/20  Dosing instructions: Take one tablet once daily.  Instructed patient to please call us back if she should have any questions.

## 2016-10-08 NOTE — Telephone Encounter (Signed)
Reviewed with patient to stop aspirin when she starts on this xarelto. She verbalized understanding with no further questions.

## 2016-10-13 ENCOUNTER — Ambulatory Visit: Payer: Medicaid Other | Attending: Family Medicine | Admitting: Physical Therapy

## 2016-10-13 ENCOUNTER — Encounter: Payer: Self-pay | Admitting: Physical Therapy

## 2016-10-13 ENCOUNTER — Telehealth: Payer: Self-pay | Admitting: Internal Medicine

## 2016-10-13 ENCOUNTER — Ambulatory Visit: Payer: Medicaid Other | Admitting: Occupational Therapy

## 2016-10-13 VITALS — BP 118/84 | HR 76

## 2016-10-13 DIAGNOSIS — R262 Difficulty in walking, not elsewhere classified: Secondary | ICD-10-CM | POA: Diagnosis present

## 2016-10-13 DIAGNOSIS — R278 Other lack of coordination: Secondary | ICD-10-CM | POA: Diagnosis present

## 2016-10-13 DIAGNOSIS — M6281 Muscle weakness (generalized): Secondary | ICD-10-CM | POA: Diagnosis present

## 2016-10-13 NOTE — Therapy (Signed)
[facility] MAIN [facility] SERVICES [address] [place], [place], [address] Phone: [phone]   Fax:  [phone]  Physical Therapy Treatment  Patient Details  Name: Margaret [name] MRN: [id number] Date of Birth: 09/17/1970 Referring Provider: Dr. Laural Benes  Encounter Date: 10/13/2016      PT End of Session - 10/13/16 1541    Visit Number 2   Number of Visits 7   Date for PT Re-Evaluation 10/29/16   Authorization Type medicaid   PT Start Time 1330   PT Stop Time 1411   PT Time Calculation (min) 41 min   Activity Tolerance No increased pain;Patient limited by lethargy   Behavior During Therapy Flat affect;Agitated      Past Medical History:  Diagnosis Date  . Asthma    controlled  . CVA (cerebral vascular accident) (HCC)   . Thyroid disease     Past Surgical History:  Procedure Laterality Date  . FRACTURE SURGERY    . TEE WITHOUT CARDIOVERSION N/A 09/08/2016   Procedure: TRANSESOPHAGEAL ECHOCARDIOGRAM (TEE);  Surgeon: [name], MD;  Location: [facility] ORS;  Service: Cardiovascular;  Laterality: N/A;    Vitals:   10/13/16 1333  BP: 118/84  Pulse: 76  SpO2: 100%        Subjective Assessment - 10/13/16 1333    Subjective Pt reports that on Friday before last she felt heart palpitations and went to ED, she has not felt any heart palpitations since. She reports she has been taking her medications as directed and has no concerns with her new medications.    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 15 min   How long can you walk comfortably? walks in grocery story, will lean over buggy;    Diagnostic tests MRI shows right frontal lobe CVA;    Patient Stated Goals "improve strength for work"     Currently in Pain? Yes   Pain Score 7    Pain Location Leg   Pain Orientation Left   Pain Descriptors / Indicators Other (Comment)  Pt says she can not describe it   Pain Type Chronic pain   Pain Onset More than a month ago   Pain Frequency Intermittent   Aggravating Factors  prolonged activity   Pain Relieving Factors Rest   Effect of Pain on Daily Activities Limited activity tolerance   Multiple Pain Sites No     Treatment:  Vitals taken at start of session, see above.  Pt led in cardiovascular exercise on NuStep to assess cardiovascular response:  5 min at level 3, no increase in HR, approx 72 at end of exercise with SPM avg 70-80. Pt was not symptomatic.  Resisted walking at 12.5# resistance in forward/ backward direction x 2 each, and to the left with eccentric return to the R before pt reported that she did not want to continue exercise due to L knee and calf pain.   LLE screened: Positive crossed straight leg raise test on L with RLE raised with pt in supine. Pt reported L thigh pain with RLE raised to 50-60 degrees.   Nerve flossing exercise x 10 on LLE passively in order to assess response and to treat LLE pain. Pt reported that her LLE  pain was abolished following nerve glide.  Pt instructed in active nerve glide exercise x 10 with supervision and tactile cues for form. HEP updated with this exercise.  Mini squats at edge of mat 3 x 10 with 1 rest break due to fatigue and reported feelings of SOB, though pt had no apparent dyspnea. Mat table was used behind pt for safety and for a tactile cue for mini squat. Pt initially refused to perform 3rd set, but changed her mind following pt education on the purpose and importance of exercise to improve activity tolerance.  Mini squats added to HEP.         PT Education - 10/13/16 1540    Education provided Yes   Education Details Ther-ex, plan of care   Person(s) Educated Patient   Methods Explanation;Demonstration;Tactile  cues;Verbal cues   Comprehension Verbalized understanding;Returned demonstration;Verbal cues required;Tactile cues required             PT Long Term Goals - 09/17/16 1750      PT LONG TERM GOAL #1   Title Patient will be independent in home exercise program to improve strength/mobility for better functional independence with ADLs.   Baseline Patient does not have a home exercise program.    Time 6   Period Weeks   Status New     PT LONG TERM GOAL #2   Title Patient will increase BLE gross strength to 4+/5 as to improve functional strength for independent gait, increased standing tolerance and increased ADL ability.   Baseline BUE grossly 3/5, BLE: hip 3+/5, knee 3/5, ankle 3+/5   Time 6   Period Weeks   Status New     PT LONG TERM GOAL #3   Title Patient (< 45 years old) will complete five times sit to stand test in < 10 seconds indicating an increased LE strength and improved balance.   Baseline 22 sec without HHA   Time 6   Period Weeks   Status New     PT LONG TERM GOAL #4   Title Patient will increase lower extremity functional scale to >60/80 to demonstrate improved functional mobility and increased tolerance with ADLs.    Baseline did not complete; will address next visit;    Time 6   Period Weeks   Status New               Plan - 10/13/16 1555    Clinical Impression Statement Pt led warm up on NuStep to assess cardiovascular response followed by ther-ex for general reconditioning for increased activity tolerance. Pt responded to NuStep with no increase in HR after 5 min. She was not symptomatic. Pt reported that her L knee has been bothering her. PT screened LE finding positive crossed straight leg raise test. Pt reported that her knee pain decreased with nerve glide of LLE. Pt was reluctant to do further exercise saying that it made her feel tired and that she did not want to overexert herself. Pt did not appear SOB and showed no signs of fatigue otherwise.  Pt seemed generally disinterested and lethargic throughout session. Pt will benefit from continued skilled therapy in order to maximize safety and functional independence with mobility and ADLs/IADLs.   Rehab Potential Good   Clinical Impairments Affecting Rehab Potential negative: concern about patient's interest in therapy as she had a flat affect during evaluation and seemed uninterested;    PT Frequency 1x / week   PT Duration 6 weeks   PT Treatment/Interventions Cryotherapy;Gait  training;Moist Heat;Functional mobility training;Therapeutic activities;Therapeutic exercise;Patient/family education;Energy conservation   PT Next Visit Plan initiate HEP- LE strengthening   PT Home Exercise Plan will address next visit   Consulted and Agree with Plan of Care Patient      Patient will benefit from skilled therapeutic intervention in order to improve the following deficits and impairments:  Decreased mobility, Decreased activity tolerance, Decreased endurance, Decreased strength, Difficulty walking  Visit Diagnosis: Muscle weakness (generalized)  Other lack of coordination  Difficulty in walking, not elsewhere classified     Problem List Patient Active Problem List   Diagnosis Date Noted  . Cryptogenic stroke (HCC) 09/23/2016  . PFO (patent foramen ovale) 09/23/2016  . Hyperlipidemia 09/17/2016  . IFG (impaired fasting glucose) 09/17/2016  . History of stroke 09/06/2016  . Myopia 12/20/2014  . Abdominal pain 12/20/2014  . Depression 12/20/2014  . Right arm pain 11/21/2014  . Thyroid activity decreased 10/18/2014  . Abnormal CBC 10/18/2014  . Tobacco abuse 10/18/2014   Cassell Smiles, SPT This entire session was performed under direct supervision and direction of a licensed therapist/therapist assistant . I have personally read, edited and approve of the note as written.  Trotter,Margaret  PT, DPT 10/13/2016, 4:44 PM  [facility] MAIN [facility]  SERVICES [address] [place], [place], [address] Phone: [phone]   Fax:  [phone]  Name: Hermine Feria MRN: [id number] Date of Birth: [birth year]

## 2016-10-13 NOTE — Telephone Encounter (Signed)
Pt needs to know if the Xarelto she can get a rx or does she need to wait and talk with Dr. Okey DupreEnd at her appointment on 7/17. Please advise.

## 2016-10-13 NOTE — Telephone Encounter (Signed)
S/w patient briefly. Patient has already started taking the Xarelto. She wanted [facility] make [facility] [address]. Let her know we had done that on 10/08/16. Before ending the call, the phone disconnected suddenly. Attempted to call patient back to see if there was anything else I could help her with and no answer. Left message to call back if she has further questions or concerns.

## 2016-10-13 NOTE — Therapy (Signed)
[facility] MAIN [facility] SERVICES [address] [place], [place], [address] Phone: [phone]   Fax:  [phone]  Occupational Therapy Treatment  Patient Details  Name: Margaret [name] MRN: [id number] Date of Birth: Feb 09, 1971 Referring Provider: Olevia Perches  Encounter Date: 10/13/2016      OT End of Session - 10/13/16 1444    Visit Number 2   Number of Visits 12   Date for OT Re-Evaluation 12/15/16   Authorization Type Medicaid visit 2   OT Start Time 1416   OT Stop Time 1440   OT Time Calculation (min) 24 min   Activity Tolerance Patient limited by lethargy   Behavior During Therapy Flat affect      Past Medical History:  Diagnosis Date  . Asthma    controlled  . CVA (cerebral vascular accident) (HCC)   . Thyroid disease     Past Surgical History:  Procedure Laterality Date  . FRACTURE SURGERY    . TEE WITHOUT CARDIOVERSION N/A 09/08/2016   Procedure: TRANSESOPHAGEAL ECHOCARDIOGRAM (TEE);  Surgeon: [name], MD;  Location: [facility] ORS;  Service: Cardiovascular;  Laterality: N/A;    There were no vitals filed for this visit.      Subjective Assessment - 10/13/16 1440    Subjective  Pt. reports she hopes the session is short because she needs to feed her daughter.   Pertinent History Patient is a 46 yo female who suffered a CVA on 09/06/16 with a brief hospitalization from 09/06/16 to 09/08/16.  She was discharged home and now referred for OP OT.  She has a history of torn ligaments in her left knee and now presents with weakness in her left arm and left LE affecting her daily routine.  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       OT TREATMENT    Therapeutic Exercise:  Pt. Performed hand strengthening with green theraputty. Pt. required cues for proper  technique. Pt. worked on gross grip loop, lateral pinch, 3pt. pinch, gross digit extension, digit extension table spread, digit abduction loop, single digit extension loop, thumb opposition, and lumbical ex,  Pt. required verbal and tactile cues for proper technique. Pt. Education was provided about HEP, and a visual handout was provided.                              OT Education - 10/13/16 1442    Education provided Yes   Education Details OT Goals.   Person(s) Educated Patient   Methods Explanation   Comprehension Verbalized understanding             OT Long Term Goals - 09/24/16 1358      OT LONG TERM GOAL #1   Title Patient will demonstrate increased strength in left upper extremity by one manual muscle grade to lift heavy pots and pans with modified independence.     Baseline difficulty with larger pots and pans, only 5# of grip in left UE   Time 12   Period Weeks   Status New     OT LONG TERM GOAL #2   Title Patient will demonstrate an increase in left grip strength 10 pounds to be able to open jars in  containers independently   Baseline difficulty with opening containers especially new jars.   Time 12   Period Weeks   Status New     OT LONG TERM GOAL #3   Title Patient will perform higher level homemaking tasks such as mopping sweeping and vacuuming with modified independence.   Baseline unable at time of evaluation, max assist   Time 12   Period Weeks   Status New     OT LONG TERM GOAL #4   Title Patient will complete tub transfer with modified independence safely.    Baseline min assist, at risk for falls with transfer   Time 12   Period Weeks   Status New     OT LONG TERM GOAL #5   Title Patient will demonstrate ability to complete home exercise program with modified independence for strength and coordination for left upper extremity.   Baseline no current home program   Time 12   Period Weeks   Status New                Plan - 10/13/16 1445    Clinical Impression Statement Pt. reports she feels hopes the session is short, because she has to feed her daughter, who was present with her at therapy. Pt. education was provided about theraputty exercises with a visual handout, HEP. Pt. performed  pink , and green resistive theraputty exercises. Pt. decided to keep the pink theraputty to work with at home. Pt. education was provided about care of theraputty, and placing it in the container when finished with it, and between uses, as putty softens. Pt. reports she did not feel well, and did not go to work last week. Pt. asked about how she can obtain an MD note to let her work know everything she has been going through since the onset. Pt. was referred to her physician. Pt. ended the session abruptly.   Occupational performance deficits (Please refer to evaluation for details): ADL's;IADL's   Rehab Potential Excellent   Current Impairments/barriers affecting progress: flat affect, no support in the home other than 589 yo daughter, motivation   OT Frequency 1x / week   OT Duration 12 weeks   OT Treatment/Interventions Self-care/ADL training;Moist Heat;DME and/or AE instruction;Patient/family education;Therapeutic exercises;Therapeutic exercise;Therapeutic activities;Neuromuscular education;Functional Mobility Training;Manual Therapy   Consulted and Agree with Plan of Care Patient      Patient will benefit from skilled therapeutic intervention in order to improve the following deficits and impairments:  Decreased coordination, Decreased range of motion, Decreased endurance, Decreased activity tolerance, Impaired UE functional use, Decreased cognition, Decreased strength  Visit Diagnosis: Muscle weakness (generalized)  Other lack of coordination    Problem List Patient Active Problem List   Diagnosis Date Noted  . Cryptogenic stroke (HCC) 09/23/2016  . PFO (patent foramen ovale) 09/23/2016  . Hyperlipidemia  09/17/2016  . IFG (impaired fasting glucose) 09/17/2016  . History of stroke 09/06/2016  . Myopia 12/20/2014  . Abdominal pain 12/20/2014  . Depression 12/20/2014  . Right arm pain 11/21/2014  . Thyroid activity decreased 10/18/2014  . Abnormal CBC 10/18/2014  . Tobacco abuse 10/18/2014    Olegario MessierElaine Winnie Umali, MS, OTR/L 10/13/2016, 5:15 PM  [facility] MAIN [facility] [address] [place], [place], [address] Phone: [phone]   Fax:  [phone]  Name: Margaret [name] MRN: [id number] Date of Birth: 12/10/1970

## 2016-10-15 ENCOUNTER — Encounter: Payer: Medicaid Other | Admitting: Occupational Therapy

## 2016-10-15 ENCOUNTER — Ambulatory Visit: Payer: Medicaid Other | Admitting: Physical Therapy

## 2016-10-16 ENCOUNTER — Other Ambulatory Visit: Payer: Self-pay | Admitting: Family Medicine

## 2016-10-19 NOTE — Progress Notes (Signed)
Follow-up Outpatient Visit Date: 10/20/2016  Primary Care Provider: Dorcas CarrowJohnson, Megan P, DO [address] [place] [id number]  Chief Complaint: Follow-up stroke and paroxysmal atrial fibrillation  HPI:  [name] is a 46 y.o. year-old female with history of stroke with recent discovery of paroxysmal atrial fibrillation and small PFO, who presents for follow-up. I last saw her on 09/23/16 after preceding hospitalization for cryptogenic stroke. TEE was notable for mildly positive bubble study without visualization of an ASD or PFO. We agreed to proceed with event monitor and transcranial Dopplers. Evidence of a small right-to-left shunt was found on the transcranial Doppler. Event monitor revealed episodic narrow complex tachycardia consistent with a-fib with RVR. The patient experienced dizziness with the episode and present to the ED 2 days later for evaluation. She was continued on ASA and started on low-dose metoprolol. After reviewing the tracings, I spoke with Ms. Foley by phone, and we agreed to start rivaroxaban for stroke prevention. [name] has been tolerating rivaroxaban well. She notes some fatigue since starting metoprolol. However, she also notes that she has not been sleeping that well at night. She also had nausea last night and a headache this morning, which is making her feel unwell. She denies new neurologic deficits, though she occasionally still has numbness or pain in the left arm. She has not had any palpitations, lightheadedness, chest pain, or shortness of breath. She denies bleeding.  --------------------------------------------------------------------------------------------------  Cardiovascular History & Procedures: Cardiovascular Problems:  Stroke  Paroxysmal atrial fibrillation  Small PFO  Risk Factors:  Stroke  Cath/PCI:  None  CV Surgery:  None  EP Procedures and Devices:  Event monitor (In process): Critical alert shows irregular narrow complex  tachycardia with HR up to 200 bpm, consistent with paroxysmal atrial fibrillation.  Non-Invasive Evaluation(s):  Transcranial Doppler (10/05/16): Small right-to-left shunt with Valsalva.  TEE (09/08/16): Normal LV size and wall thickness. Normal systolic function. Trivial aortic regurgitation. Mild MR. No left atrial thrombus. No right atrial thrombus. Atrial septum with question of small right to left shunt, with appearance of small number of bubbles in the left ventricle after injection of agitated saline.  TTE (09/07/16): Normal LV size and wall thickness. LVEF 60-65% with normal wall motion. Grade 1 diastolic dysfunction. Normal RV size and function. No significant valvular abnormalities.  Recent CV Pertinent Labs: Lab Results  Component Value Date   CHOL 179 09/07/2016   CHOL 195 12/20/2014   CHOL 140 09/04/2013   HDL 35 (L) 09/07/2016   HDL 41 12/20/2014   HDL 30 (L) 09/04/2013   LDLCALC 133 (H) 09/07/2016   LDLCALC 120 (H) 12/20/2014   LDLCALC 83 09/04/2013   TRIG 57 09/07/2016   TRIG 137 09/04/2013   CHOLHDL 5.1 09/07/2016   INR 1.13 09/06/2016   INR 1.2 09/04/2013   K 3.6 10/04/2016   K 3.8 09/04/2013   MG 1.8 09/02/2013   BUN 12 10/04/2016   BUN 8 05/14/2016   BUN 11 09/04/2013   CREATININE 0.89 10/04/2016   CREATININE 0.81 09/04/2013    Past medical and surgical history were reviewed and updated in EPIC.  Current Meds  Medication Sig  . atorvastatin (LIPITOR) 40 MG tablet Take 1 tablet (40 mg total) by mouth daily at 6 PM.  . levothyroxine (SYNTHROID, LEVOTHROID) 137 MCG tablet take 1 tablet by mouth once daily WITH BREAKFAST  . metoprolol tartrate (LOPRESSOR) 25 MG tablet Take 1 tablet (25 mg total) by mouth 2 (two) times daily.  .Marland Kitchen  rivaroxaban (XARELTO) 20 MG TABS tablet Take 1 tablet (20 mg total) by mouth daily with supper.    Allergies: Patient has no known allergies.  Social History   Social History  . Marital status: Divorced    Spouse name: N/A  .  Number of children: 2  . Years of education: 12   Occupational History  . [employer]    Social History Main Topics  . Smoking status: Current Every Day Smoker    Packs/day: 0.25    Types: Cigarettes  . Smokeless tobacco: Former Neurosurgeon    Types: Chew     Comment: trying to quit; going to start using patches;   . Alcohol use 0.0 oz/week     Comment: on occasion  . Drug use: Yes    Types: Marijuana     Comment: Previously, but stopped years ago  . Sexual activity: Yes    Partners: Male    Birth control/ protection: None, Condom   Other Topics Concern  . Not on file   Social History Narrative   Lives with daughter   Caffeine use: daily   Right handed    Family History  Problem Relation Age of Onset  . Stroke Father     Review of Systems: A 12-system review of systems was performed and was negative except as noted in the HPI.  --------------------------------------------------------------------------------------------------  Physical Exam: BP 114/80 (BP Location: Left Arm, Patient Position: Sitting, Cuff Size: Normal)   Pulse 76   Ht 5\' 5"  (1.651 m)   Wt 95.9 kg (211 lb 8 oz)   BMI 35.20 kg/m   General:  Obese woman, seated comfortably in the exam room. She is accompanied by her daughter. HEENT: No conjunctival pallor or scleral icterus. Moist mucous membranes.  OP clear. Neck: Supple without lymphadenopathy, thyromegaly, JVD, or HJR. Lungs: Normal work of breathing. Clear to auscultation bilaterally without wheezes or crackles. Heart: Regular rate and rhythm without murmurs, rubs, or gallops. Non-displaced PMI. Abd: Bowel sounds present. Soft, NT/ND without hepatosplenomegaly Ext: No lower extremity edema. Radial, PT, and DP pulses are 2+ bilaterally. Neuro: Cranial nerves III through XII intact. Normal fine touch sensation throughout. 5/5 strength in the upper and right lower extremities. 4+/5 left hip flexion and knee extension, which the patient reports is  chronic. Skin: Warm and dry without rash.  EKG:  Normal sinus rhythm without abnormalities.  Lab Results  Component Value Date   WBC 12.2 (H) 10/04/2016   HGB 12.4 10/04/2016   HCT 37.0 10/04/2016   MCV 83.2 10/04/2016   PLT 333 10/04/2016    Lab Results  Component Value Date   NA 139 10/04/2016   K 3.6 10/04/2016   CL 108 10/04/2016   CO2 27 10/04/2016   BUN 12 10/04/2016   CREATININE 0.89 10/04/2016   GLUCOSE 94 10/04/2016   ALT 13 (L) 09/06/2016    Lab Results  Component Value Date   CHOL 179 09/07/2016   HDL 35 (L) 09/07/2016   LDLCALC 133 (H) 09/07/2016   TRIG 57 09/07/2016   CHOLHDL 5.1 09/07/2016    --------------------------------------------------------------------------------------------------  ASSESSMENT AND PLAN: Stroke No new neurologic symptoms reported today. Etiology of stroke is most likely an embolic event secondary to paroxysmal atrial fibrillation, though TEE and transcranial Dopplers both showed evidence of a small right to left shunt. In this setting, we have agreed to indefinite anticoagulation, given a CHADSVASC score of at least 3 (gender and stroke 2).  Paroxysmal atrial fibrillation No recurrent  symptoms since we spoke on the phone. Patient will complete monitoring. Later this week. We discussed switching from metoprolol to another agent such as diltiazem as well as decreasing metoprolol dose, given her fatigue. She would like to continue with her current regimen. As above, we will continue with indefinite anticoagulation with rivaroxaban given her CHADSVASC score of 3. If she continues to have significant fatigue, we will need to consider a sleep study in the future, as sleep apnea has been associated with atrial fibrillation.  PFO Defect was never visualized, but transcranial Dopplers and TEE both showed evidence of right-to-left shunt. Given very small size and plan for indefinite anticoagulation, we will defer further workup and  management.  Follow-up: Return to clinic in 3 months.  [name], MD 10/20/2016 2:09 PM

## 2016-10-20 ENCOUNTER — Encounter: Payer: Self-pay | Admitting: Internal Medicine

## 2016-10-20 ENCOUNTER — Ambulatory Visit (INDEPENDENT_AMBULATORY_CARE_PROVIDER_SITE_OTHER): Payer: Medicaid Other | Admitting: Internal Medicine

## 2016-10-20 ENCOUNTER — Ambulatory Visit: Payer: Medicaid Other | Admitting: Physical Therapy

## 2016-10-20 ENCOUNTER — Encounter: Payer: Self-pay | Admitting: Occupational Therapy

## 2016-10-20 ENCOUNTER — Encounter: Payer: Self-pay | Admitting: *Deleted

## 2016-10-20 ENCOUNTER — Ambulatory Visit: Payer: Medicaid Other | Admitting: Occupational Therapy

## 2016-10-20 VITALS — BP 114/80 | HR 76 | Ht 65.0 in | Wt 211.5 lb

## 2016-10-20 DIAGNOSIS — Q2112 Patent foramen ovale: Secondary | ICD-10-CM

## 2016-10-20 DIAGNOSIS — Z8673 Personal history of transient ischemic attack (TIA), and cerebral infarction without residual deficits: Secondary | ICD-10-CM

## 2016-10-20 DIAGNOSIS — Q211 Atrial septal defect: Secondary | ICD-10-CM

## 2016-10-20 DIAGNOSIS — I480 Paroxysmal atrial fibrillation: Secondary | ICD-10-CM

## 2016-10-20 DIAGNOSIS — M6281 Muscle weakness (generalized): Secondary | ICD-10-CM | POA: Diagnosis not present

## 2016-10-20 NOTE — Patient Instructions (Signed)
Medication Instructions:  Your physician recommends that you continue on your current medications as directed. Please refer to the Current Medication list given to you today.   Labwork: none  Testing/Procedures: none  Follow-Up: Your physician recommends that you schedule a follow-up appointment in: 3 MONTHS WITH DR END.  If nausea or headache return, please contact your PCP or go to ED.   If you need a refill on your cardiac medications before your next appointment, please call your pharmacy.

## 2016-10-21 NOTE — Therapy (Signed)
[facility] [address] [place], [place], [address] Phone: [phone]   Fax:  [phone]  Occupational Therapy Treatment/Discharge Note  Patient Details  Name: Margaret [name] MRN: [id number] Date of Birth: 09/28/1970 Referring Provider: Olevia PerchesJohnson, Megan  Encounter Date: 10/20/2016      OT End of Session - 10/20/16 1552    Visit Number 3   Number of Visits 12   Date for OT Re-Evaluation 12/15/16   Authorization Type Medicaid visit 3   OT Start Time 1455   OT Stop Time 1510   OT Time Calculation (min) 15 min   Activity Tolerance Other (comment)  Pt. reports she wants to do PT for her LE.      Past Medical History:  Diagnosis Date  . Asthma    controlled  . Atrial fibrillation (HCC) 09/2016  . CVA (cerebral vascular accident) (HCC)   . PFO (patent foramen ovale)   . Thyroid disease     Past Surgical History:  Procedure Laterality Date  . FRACTURE SURGERY    . TEE WITHOUT CARDIOVERSION N/A 09/08/2016   Procedure: TRANSESOPHAGEAL ECHOCARDIOGRAM (TEE);  Surgeon: Yvonne KendallEnd, Christopher, MD;  Location: [facility] ORS;  Service: Cardiovascular;  Laterality: N/A;    There were no vitals filed for this visit.      Subjective Assessment - 10/20/16 1548    Subjective  Pt. missed her PT session which was acheduled prior to the OT session. Pt. reports being at the doctor.   Pertinent History Patient is a 46 yo female who suffered a CVA on 09/06/16 with a brief hospitalization from 09/06/16 to 09/08/16.  She was discharged home and now referred for OP OT.  She has a history of torn ligaments in her left knee and now presents with weakness in her left arm and left LE affecting her daily routine.  She works part time as a LawyerCNA and has a 539 yo daughter who she cares for.    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       OT TREATMENT     Selfcare:  Pt. Education was provided about DME including grabs, and shower chairs/benches.Pt. Education was provided about OT services, ADLs, and IADLs.                             OT Education - 10/20/16 1551    Education provided Yes   Education Details OT services,  DME    Person(s) Educated Patient   Methods Explanation;Demonstration;Tactile cues;Verbal cues   Comprehension Verbalized understanding;Returned demonstration;Verbal cues required;Tactile cues required             OT Long Term Goals - 09/24/16 1358      OT LONG TERM GOAL #1   Title Patient will demonstrate increased strength in left upper extremity by one manual muscle grade to lift heavy pots and pans with modified independence.     Baseline difficulty with larger pots and pans, only 5# of grip in left UE   Time 12   Period Weeks   Status New     OT LONG TERM GOAL #2   Title Patient will demonstrate an increase in left grip strength 10 pounds to be able to open jars in containers independently   Baseline difficulty with opening containers especially new jars.   Time 12  Period Weeks   Status New     OT LONG TERM GOAL #3   Title Patient will perform higher level homemaking tasks such as mopping sweeping and vacuuming with modified independence.   Baseline unable at time of evaluation, max assist   Time 12   Period Weeks   Status New     OT LONG TERM GOAL #4   Title Patient will complete tub transfer with modified independence safely.    Baseline min assist, at risk for falls with transfer   Time 12   Period Weeks   Status New     OT LONG TERM GOAL #5   Title Patient will demonstrate ability to complete home exercise program with modified independence for strength and coordination for left upper extremity.   Baseline no current home program   Time 12   Period Weeks   Status New               Plan - 10/20/16 1734    Clinical Impression Statement Upon arrival  pt. reported concerns about her ability to return to work as a Lawyer in a rehabilitation facility. Pt. also reported concern about her fear of having another medical episode at work. Recommendation was made for pt. to follow-up with her Physician regarding medical issues impacting her ability to return to work. Pt.'s questions were answered about DME shower chairs, grab bars, and local medical supply stores. Pt. reports she wants to only continue with PT to work on her LE secondary to history of knee issues. Pt. is appropriate for discharge from OT services at this time. Pt. is in agreement.    Occupational performance deficits (Please refer to evaluation for details): ADL's;IADL's   Rehab Potential Excellent   Current Impairments/barriers affecting progress: flat affect, no support in the home other than 87 yo daughter, motivation   OT Frequency 1x / week   OT Duration 12 weeks   OT Treatment/Interventions Self-care/ADL training   OT Home Exercise Plan Pt. has a HEP for Theraputty.   Consulted and Agree with Plan of Care Patient      Patient will benefit from skilled therapeutic intervention in order to improve the following deficits and impairments:  Decreased coordination, Decreased range of motion, Decreased endurance, Decreased activity tolerance, Impaired UE functional use, Decreased cognition, Decreased strength  Visit Diagnosis: Muscle weakness (generalized)    Problem List Patient Active Problem List   Diagnosis Date Noted  . Cryptogenic stroke (HCC) 09/23/2016  . PFO (patent foramen ovale) 09/23/2016  . Hyperlipidemia 09/17/2016  . IFG (impaired fasting glucose) 09/17/2016  . History of stroke 09/06/2016  . Myopia 12/20/2014  . Abdominal pain 12/20/2014  . Depression 12/20/2014  . Right arm pain 11/21/2014  . Thyroid activity decreased 10/18/2014  . Abnormal CBC 10/18/2014  . Tobacco abuse 10/18/2014    Olegario Messier, MS, OTR/L 10/21/2016, 9:05 AM  [facility] MAIN [facility] SERVICES [address] [place], [place], [address] Phone: [phone]   Fax:  [phone]  Name: Lynnette Pote MRN: [id number] Date of Birth: July 01, 1970

## 2016-10-22 ENCOUNTER — Ambulatory Visit: Payer: Medicaid Other | Admitting: Physical Therapy

## 2016-10-22 ENCOUNTER — Encounter: Payer: Medicaid Other | Admitting: Occupational Therapy

## 2016-10-23 ENCOUNTER — Telehealth: Payer: Self-pay | Admitting: Family Medicine

## 2016-10-23 MED ORDER — ATORVASTATIN CALCIUM 40 MG PO TABS: 40.0000 mg | ORAL_TABLET | Freq: Every day | ORAL | 3 refills | Status: DC

## 2016-10-23 NOTE — Telephone Encounter (Signed)
Forward to provider

## 2016-10-23 NOTE — Telephone Encounter (Signed)
Pt called and would like a refill for atorvastatin to be sent to [facility] [address]

## 2016-10-27 ENCOUNTER — Ambulatory Visit: Payer: Medicaid Other | Admitting: Physical Therapy

## 2016-10-27 ENCOUNTER — Encounter: Payer: Medicaid Other | Admitting: Occupational Therapy

## 2016-10-27 ENCOUNTER — Ambulatory Visit: Payer: Medicaid Other | Admitting: Occupational Therapy

## 2016-10-29 ENCOUNTER — Telehealth: Payer: Self-pay | Admitting: Family Medicine

## 2016-10-29 ENCOUNTER — Ambulatory Visit: Payer: Medicaid Other | Admitting: Physical Therapy

## 2016-10-29 ENCOUNTER — Ambulatory Visit (INDEPENDENT_AMBULATORY_CARE_PROVIDER_SITE_OTHER): Payer: Medicaid Other | Admitting: Family Medicine

## 2016-10-29 ENCOUNTER — Encounter: Payer: Self-pay | Admitting: Family Medicine

## 2016-10-29 ENCOUNTER — Encounter: Payer: Medicaid Other | Admitting: Occupational Therapy

## 2016-10-29 VITALS — BP 93/68 | HR 80 | Wt 209.5 lb

## 2016-10-29 DIAGNOSIS — Q211 Atrial septal defect: Secondary | ICD-10-CM | POA: Diagnosis not present

## 2016-10-29 DIAGNOSIS — I480 Paroxysmal atrial fibrillation: Secondary | ICD-10-CM | POA: Diagnosis not present

## 2016-10-29 DIAGNOSIS — F331 Major depressive disorder, recurrent, moderate: Secondary | ICD-10-CM

## 2016-10-29 DIAGNOSIS — E782 Mixed hyperlipidemia: Secondary | ICD-10-CM

## 2016-10-29 DIAGNOSIS — B9689 Other specified bacterial agents as the cause of diseases classified elsewhere: Secondary | ICD-10-CM | POA: Diagnosis not present

## 2016-10-29 DIAGNOSIS — E039 Hypothyroidism, unspecified: Secondary | ICD-10-CM

## 2016-10-29 DIAGNOSIS — Z720 Tobacco use: Secondary | ICD-10-CM

## 2016-10-29 DIAGNOSIS — T63441A Toxic effect of venom of bees, accidental (unintentional), initial encounter: Secondary | ICD-10-CM | POA: Diagnosis not present

## 2016-10-29 DIAGNOSIS — N760 Acute vaginitis: Secondary | ICD-10-CM

## 2016-10-29 DIAGNOSIS — Z3042 Encounter for surveillance of injectable contraceptive: Secondary | ICD-10-CM

## 2016-10-29 DIAGNOSIS — Z8673 Personal history of transient ischemic attack (TIA), and cerebral infarction without residual deficits: Secondary | ICD-10-CM

## 2016-10-29 DIAGNOSIS — Q2112 Patent foramen ovale: Secondary | ICD-10-CM

## 2016-10-29 DIAGNOSIS — N898 Other specified noninflammatory disorders of vagina: Secondary | ICD-10-CM

## 2016-10-29 MED ORDER — BUPROPION HCL ER (SR) 150 MG PO TB12: ORAL_TABLET | ORAL | 3 refills | Status: DC

## 2016-10-29 MED ORDER — METRONIDAZOLE 500 MG PO TABS: 500.0000 mg | ORAL_TABLET | Freq: Two times a day (BID) | ORAL | 0 refills | Status: DC

## 2016-10-29 MED ORDER — ATORVASTATIN CALCIUM 40 MG PO TABS: 40.0000 mg | ORAL_TABLET | Freq: Every day | ORAL | 6 refills | Status: DC

## 2016-10-29 NOTE — Assessment & Plan Note (Signed)
Following with cardiology. Call with any concerns. On xarelto.

## 2016-10-29 NOTE — Assessment & Plan Note (Signed)
Did not start her wellbutrin. Not under good control. Likely the cause of her fatigue- encouraged her to take her medicine. Recheck 1 month.

## 2016-10-29 NOTE — Assessment & Plan Note (Signed)
Keeping BP/cholesterol and sugars under good control. No hormones while she is smoking.

## 2016-10-29 NOTE — Assessment & Plan Note (Signed)
Tolerating well. Will check labs tomorrow to make sure LFTs doing OK.

## 2016-10-29 NOTE — Assessment & Plan Note (Signed)
Newly diagnosed. Started on xarelto. Rate controlled. Continue to follow with cardiology. Call with any concerns or complaints.

## 2016-10-29 NOTE — Progress Notes (Signed)
BP 93/68 (BP Location: Left Arm, Patient Position: Sitting, Cuff Size: Large)   Pulse 80   Wt 209 lb 8 oz (95 kg)   SpO2 100%   BMI 34.86 kg/m    Subjective:    Patient ID: Margaret [name] Code, female    DOB: 09/05/1970, 46 y.o.   MRN: [id number]  HPI: Margaret [name] Sethi is a 46 y.o. female  Chief Complaint  Patient presents with  . Fatigue  . Cerebrovascular Accident  . skin lesion    right forearm, patient states that she was stung last Thursday.   . depo    patient would like to get a depo shot today. [facility]/29/16.   Got stung by a bee last week. Now has a small welp.  HYPOTHYROIDISM Thyroid control status:better Satisfied with current treatment? yes Medication side effects: no Medication compliance: excellent compliance Recent dose adjustment:yes Fatigue: yes Cold intolerance: no Heat intolerance: yes  Weight gain: no Weight loss: no Constipation: yes Diarrhea/loose stools: no Palpitations: no Lower extremity edema: no Anxiety/depressed mood: yes  SMOKING CESSATION- hasn't been taking it Smoking Status: current every day smoker Smoking Amount: 1 pack/ 3 day Children in the house: yes Other household members who smoke: no Treatments attempted: cold [place]  DEPRESSION Mood status: uncontrolled Satisfied with current treatment?: no Symptom severity: severe  Duration of current treatment : Not on anything right now Side effects: no Medication compliance: poor compliance Psychotherapy/counseling: no  Previous psychiatric medications: None Depressed mood: yes Anxious mood: yes Anhedonia: no Significant weight loss or gain: no Insomnia: no  Fatigue: yes Feelings of worthlessness or guilt: yes Impaired concentration/indecisiveness: yes Suicidal ideations: no Hopelessness: yes Crying spells: yes Depression screen PHQ 2/9 10/29/2016  Decreased Interest 2  Down, Depressed, Hopeless 2    PHQ - 2 Score 4  Altered sleeping 3  Tired, decreased energy 3  Change in appetite 3  Feeling bad or failure about yourself  2  Trouble concentrating 2  Moving slowly or fidgety/restless 2  Suicidal thoughts 0  PHQ-9 Score 19    HYPERLIPIDEMIA Hyperlipidemia status: fair compliance Satisfied with current treatment?  yes Side effects:  no Medication compliance: good compliance Past cholesterol meds: atorvastatin Supplements: none Aspirin:  no The ASCVD Risk score Denman George(Goff DC Jr., et al., 2013) failed to calculate for the following reasons:   The patient has a prior MI or stroke diagnosis Chest pain:  no Coronary artery disease:  yes  VAGINAL ITCHING Duration: weeks Discharge description: clear  Pruritus: yes Dysuria: yes Malodorous: no Urinary frequency: no Fevers: no Abdominal pain: no  Sexual activity: not sexually active History of sexually transmitted diseases: no Recent antibiotic use: no Context: None  Treatments attempted: none   Relevant past medical, surgical, family and social history reviewed and updated as indicated. Interim medical history since our last visit reviewed. Allergies and medications reviewed and updated.  Review of Systems  Constitutional: Negative.   Respiratory: Negative.   Cardiovascular: Negative.   Genitourinary: Positive for vaginal discharge. Negative for decreased urine volume, difficulty urinating, dyspareunia, dysuria, enuresis, flank pain, frequency, genital sores, hematuria, menstrual problem, pelvic pain, urgency, vaginal bleeding and vaginal pain.  Psychiatric/Behavioral: Negative.     Per HPI unless specifically indicated above     Objective:    BP 93/68 (BP Location: Left Arm, Patient Position: Sitting, Cuff Size: Large)   Pulse 80   Wt 209 lb 8 oz (95 kg)  SpO2 100%   BMI 34.86 kg/m   Wt Readings from Last 3 Encounters:  10/29/16 209 lb 8 oz (95 kg)  10/20/16 211 lb 8 oz (95.9 kg)  10/04/16 213 lb (96.6 kg)     Physical Exam  Constitutional: She is oriented to person, place, and time. She appears well-developed and well-nourished. No distress.  HENT:  Head: Normocephalic and atraumatic.  Right Ear: Hearing normal.  Left Ear: Hearing normal.  Nose: Nose normal.  Eyes: Conjunctivae and lids are normal. Right eye exhibits no discharge. Left eye exhibits no discharge. No scleral icterus.  Cardiovascular: Normal rate, regular rhythm, normal heart sounds and intact distal pulses.  Exam reveals no gallop and no friction rub.   No murmur heard. Pulmonary/Chest: Effort normal and breath sounds normal. No respiratory distress. She has no wheezes. She has no rales. She exhibits no tenderness.  Genitourinary: Vaginal discharge found.  Musculoskeletal: Normal range of motion.  Neurological: She is alert and oriented to person, place, and time.  Skin: Skin is warm, dry and intact. No rash noted. She is not diaphoretic. No erythema. No pallor.  Psychiatric: Her speech is normal and behavior is normal. Judgment and thought content normal. Cognition and memory are normal. She exhibits a depressed mood.  Nursing note and vitals reviewed.   Results for orders placed or performed during the hospital encounter of 10/04/16  Basic metabolic panel  Result Value Ref Range   Sodium 139 135 - 145 mmol/L   Potassium 3.6 3.5 - 5.1 mmol/L   Chloride 108 101 - 111 mmol/L   CO2 27 22 - 32 mmol/L   Glucose, Bld 94 65 - 99 mg/dL   BUN 12 6 - 20 mg/dL   Creatinine, Ser 1.61 0.44 - 1.00 mg/dL   Calcium 9.2 8.9 - 09.6 mg/dL   GFR calc non Af Amer >60 >60 mL/min   GFR calc Af Amer >60 >60 mL/min   Anion gap 4 (L) 5 - 15  CBC  Result Value Ref Range   WBC 12.2 (H) 3.6 - 11.0 K/uL   RBC 4.45 3.80 - 5.20 MIL/uL   Hemoglobin 12.4 12.0 - 16.0 g/dL   HCT 04.5 40.9 - 81.1 %   MCV 83.2 80.0 - 100.0 fL   MCH 27.9 26.0 - 34.0 pg   MCHC 33.5 32.0 - 36.0 g/dL   RDW 91.4 (H) 78.2 - 95.6 %   Platelets 333 150 - 440 K/uL   Urinalysis, Complete w Microscopic  Result Value Ref Range   Color, Urine YELLOW (A) YELLOW   APPearance HAZY (A) CLEAR   Specific Gravity, Urine 1.027 1.005 - 1.030   pH 5.0 5.0 - 8.0   Glucose, UA NEGATIVE NEGATIVE mg/dL   Hgb urine dipstick NEGATIVE NEGATIVE   Bilirubin Urine NEGATIVE NEGATIVE   Ketones, ur NEGATIVE NEGATIVE mg/dL   Protein, ur NEGATIVE NEGATIVE mg/dL   Nitrite NEGATIVE NEGATIVE   Leukocytes, UA NEGATIVE NEGATIVE   RBC / HPF 0-5 0 - 5 RBC/hpf   WBC, UA 0-5 0 - 5 WBC/hpf   Bacteria, UA NONE SEEN NONE SEEN   Squamous Epithelial / LPF 0-5 (A) NONE SEEN   Mucous PRESENT   TSH  Result Value Ref Range   TSH 0.768 0.350 - 4.500 uIU/mL  T4, free  Result Value Ref Range   Free T4 1.27 (H) 0.61 - 1.12 ng/dL      Assessment & Plan:   Problem List Items Addressed This Visit  Cardiovascular and Mediastinum   PFO (patent foramen ovale)    Following with cardiology. Call with any concerns. On xarelto.       Relevant Medications   atorvastatin (LIPITOR) 40 MG tablet   Paroxysmal atrial fibrillation (HCC)    Newly diagnosed. Started on xarelto. Rate controlled. Continue to follow with cardiology. Call with any concerns or complaints.       Relevant Medications   atorvastatin (LIPITOR) 40 MG tablet     Endocrine   Thyroid activity decreased - Primary   Relevant Orders   TSH     Other   Tobacco abuse    Did not take the wellbutrin. Congratulated her on reducing her smoking. Start wellbutrin and recheck 49month.      Depression    Did not start her wellbutrin. Not under good control. Likely the cause of her fatigue- encouraged her to take her medicine. Recheck 1 month.       Relevant Medications   buPROPion (WELLBUTRIN SR) 150 MG 12 hr tablet   History of stroke    Keeping BP/cholesterol and sugars under good control. No hormones while she is smoking.      Hyperlipidemia    Tolerating well. Will check labs tomorrow to make sure LFTs doing OK.       Relevant Medications   atorvastatin (LIPITOR) 40 MG tablet   Other Relevant Orders   Lipid Panel w/o Chol/HDL Ratio   Comprehensive metabolic panel    Other Visit Diagnoses    Vaginal irritation       +BV- will treat.   Relevant Orders   WET PREP FOR TRICH, YEAST, CLUE   Encounter for surveillance of injectable contraceptive       No depo right now given her history of stroke and she is contuing to smoke. Will recheck 1 month. Back up birth control for now.    BV (bacterial vaginosis)       Will treat with flagyl. Call with any concerns or if not getting better.    Bee sting, accidental or unintentional, initial encounter       Use cortisone. Call with any concerns or if not getting better.       Patient left the office without being done while waiting for results. She did not have her blood drawn. Called patient (see TE) to give her results of her wet prep. She requested to come in tomorrow for blood work. We will do that.  Follow up plan: Return Tomorrow , for Blood work. 1 month, follow up mood and thyroid.

## 2016-10-29 NOTE — Telephone Encounter (Signed)
Patient left the office prior to appointment being done. Called her with results of wet prep- + BV. Will treat with flagyl. Did not get her blood drawn. Will return tomorrow to have blood drawn.

## 2016-10-29 NOTE — Assessment & Plan Note (Signed)
Did not take the wellbutrin. Congratulated her on reducing her smoking. Start wellbutrin and recheck 58month.

## 2016-11-03 ENCOUNTER — Telehealth: Payer: Self-pay | Admitting: Neurology

## 2016-11-03 ENCOUNTER — Ambulatory Visit: Payer: Medicaid Other | Admitting: Podiatry

## 2016-11-03 ENCOUNTER — Encounter: Payer: Self-pay | Admitting: Podiatry

## 2016-11-03 ENCOUNTER — Encounter: Payer: Medicaid Other | Admitting: Occupational Therapy

## 2016-11-03 ENCOUNTER — Ambulatory Visit: Payer: Medicaid Other | Admitting: Physical Therapy

## 2016-11-03 ENCOUNTER — Ambulatory Visit (INDEPENDENT_AMBULATORY_CARE_PROVIDER_SITE_OTHER): Payer: Medicaid Other | Admitting: Podiatry

## 2016-11-03 DIAGNOSIS — M79676 Pain in unspecified toe(s): Secondary | ICD-10-CM | POA: Diagnosis not present

## 2016-11-03 DIAGNOSIS — B351 Tinea unguium: Secondary | ICD-10-CM | POA: Diagnosis not present

## 2016-11-03 LAB — WET PREP FOR TRICH, YEAST, CLUE
Clue Cell Exam: POSITIVE — AB
TRICHOMONAS EXAM: NEGATIVE
YEAST EXAM: NEGATIVE

## 2016-11-03 NOTE — Progress Notes (Signed)
   SUBJECTIVE Patient  presents to office today complaining of elongated, thickened nails. Pain while ambulating in shoes. Patient is unable to trim their own nails.   OBJECTIVE General Patient is awake, alert, and oriented x 3 and in no acute distress. Derm Skin is dry and supple bilateral. Negative open lesions or macerations. Remaining integument unremarkable. Nails are tender, long, thickened and dystrophic with subungual debris, consistent with onychomycosis, selective nails bilateral. No signs of infection noted. Vasc  DP and PT pedal pulses palpable bilaterally. Temperature gradient within normal limits.  Neuro Epicritic and protective threshold sensation diminished bilaterally.  Musculoskeletal Exam No symptomatic pedal deformities noted bilateral. Muscular strength within normal limits.  ASSESSMENT 1. Onychodystrophic nails bilateral with hyperkeratosis of nails.  2. Onychomycosis of nail due to dermatophyte bilateral 3. Pain in foot bilateral  PLAN OF CARE 1. Patient evaluated today.  2. Instructed to maintain good pedal hygiene and foot care.  3. Mechanical debridement of nails bilaterally performed using a nail nipper. Filed with dremel without incident.  4. Return to clinic in 3 mos.    Felecia ShellingBrent M. Loden Laurent, DPM [facility]  Dr. Felecia ShellingBrent M. Mansi Tokar, DPM    [address]                                        [place], [place] [address]                Office [phone]  Fax [phone]

## 2016-11-03 NOTE — Telephone Encounter (Addendum)
I called patient. The cardiac monitor study did show atrial fibrillation, the patient has already been seen by cardiology, she has been placed on Xarelto, and she is on metoprolol for heart rate control.   Cardiac monitor study 11/03/16:   The patient was enrolled for 30 days.  The predominant rhythm was sinus.  Isolated PACs and PVCs were identified.  2 episodes of narrow complex tachycardia were identified on 10/02/16 with heart rates up to 230 bpm. While some portions appear regular, there are other periods of irregularity without clear P waves consistent with atrial fibrillation +/- atrial flutter or SVT.  No prolonged pauses were identified.  Patient triggered events corresponded to narrow complex tachycardia consistent with atrial fibrillation +/- atrial flutter/SVT.   Predominantly sinus rhythm with paroxysmal atrial fibrillation +/- atrial flutter/SVT.  Narrow complex tachycardia was discussed with the patient and treatment for paroxysmal atrial fibrillation initiated while the patient was still being monitored.

## 2016-11-04 NOTE — Telephone Encounter (Signed)
I called patient. The heart monitor showed what looked like atrial fibrillation, the patient has already seen cardiology and is being treated for this problem, she has been aware of the results of the heart monitor well before I was given the results. She is on a blood thinner at this time.

## 2016-11-04 NOTE — Telephone Encounter (Signed)
Pt returned the call to Dr Anne HahnWillis and would like a call back to discuss results

## 2016-11-05 ENCOUNTER — Encounter: Payer: Medicaid Other | Admitting: Occupational Therapy

## 2016-11-09 ENCOUNTER — Ambulatory Visit: Payer: Medicaid Other | Admitting: Physical Therapy

## 2016-11-10 ENCOUNTER — Encounter: Payer: Medicaid Other | Admitting: Occupational Therapy

## 2016-11-11 ENCOUNTER — Other Ambulatory Visit: Payer: Self-pay | Admitting: Family Medicine

## 2016-11-12 ENCOUNTER — Other Ambulatory Visit: Payer: Medicaid Other

## 2016-11-12 ENCOUNTER — Ambulatory Visit: Payer: Medicaid Other | Admitting: Physical Therapy

## 2016-11-12 ENCOUNTER — Telehealth: Payer: Self-pay

## 2016-11-12 DIAGNOSIS — E782 Mixed hyperlipidemia: Secondary | ICD-10-CM

## 2016-11-12 DIAGNOSIS — E039 Hypothyroidism, unspecified: Secondary | ICD-10-CM

## 2016-11-12 NOTE — Telephone Encounter (Signed)
[facility] a [facility] 500 mg, because patient lost the prescription after only taking it for two days. Verbal from Dr.Johnson to call in   Metronidazole 500mg  1 tablet BID Amount 14 with no refills.

## 2016-11-12 NOTE — Telephone Encounter (Signed)
As below.  Thanks.

## 2016-11-13 ENCOUNTER — Telehealth: Payer: Self-pay | Admitting: Family Medicine

## 2016-11-13 DIAGNOSIS — E039 Hypothyroidism, unspecified: Secondary | ICD-10-CM

## 2016-11-13 LAB — COMPREHENSIVE METABOLIC PANEL
ALK PHOS: 95 IU/L (ref 39–117)
ALT: 14 IU/L (ref 0–32)
AST: 18 IU/L (ref 0–40)
Albumin/Globulin Ratio: 1.6 (ref 1.2–2.2)
Albumin: 4.1 g/dL (ref 3.5–5.5)
BUN / CREAT RATIO: 8 — AB (ref 9–23)
BUN: 6 mg/dL (ref 6–24)
Bilirubin Total: 0.3 mg/dL (ref 0.0–1.2)
CO2: 24 mmol/L (ref 20–29)
CREATININE: 0.76 mg/dL (ref 0.57–1.00)
Calcium: 9 mg/dL (ref 8.7–10.2)
Chloride: 105 mmol/L (ref 96–106)
GFR, EST AFRICAN AMERICAN: 110 mL/min/{1.73_m2} (ref >59)
GFR, EST NON AFRICAN AMERICAN: 96 mL/min/{1.73_m2} (ref >59)
GLOBULIN, TOTAL: 2.6 g/dL (ref 1.5–4.5)
Glucose: 92 mg/dL (ref 65–99)
Potassium: 3.7 mmol/L (ref 3.5–5.2)
SODIUM: 144 mmol/L (ref 134–144)
TOTAL PROTEIN: 6.7 g/dL (ref 6.0–8.5)

## 2016-11-13 LAB — LIPID PANEL W/O CHOL/HDL RATIO
Cholesterol, Total: 99 mg/dL — ABNORMAL LOW (ref 100–199)
HDL: 37 mg/dL — AB (ref >39)
LDL Calculated: 33 mg/dL (ref 0–99)
Triglycerides: 143 mg/dL (ref 0–149)
VLDL Cholesterol Cal: 29 mg/dL (ref 5–40)

## 2016-11-13 LAB — TSH: TSH: 0.13 u[IU]/mL — AB (ref 0.450–4.500)

## 2016-11-13 MED ORDER — LEVOTHYROXINE SODIUM 125 MCG PO TABS: 125.0000 ug | ORAL_TABLET | Freq: Every day | ORAL | 1 refills | Status: DC

## 2016-11-13 NOTE — Telephone Encounter (Signed)
Please let Margaret [name] know that her labs look good for the most part, but we are over treating her thyroid. I'm going to reduce her medicine to 125mcg and we'll recheck in 6 weeks. Rx sent to her pharmacy and order in. Thanks!

## 2016-11-13 NOTE — Telephone Encounter (Signed)
Called and left a message for patient to return my call.  

## 2016-11-16 NOTE — Telephone Encounter (Signed)
Called and left a message for patient to return my call.  

## 2016-11-17 ENCOUNTER — Encounter: Payer: Medicaid Other | Admitting: Occupational Therapy

## 2016-11-17 ENCOUNTER — Encounter: Payer: Self-pay | Admitting: Family Medicine

## 2016-11-17 NOTE — Telephone Encounter (Signed)
Letter generated and sent to patient.  

## 2016-11-18 ENCOUNTER — Telehealth: Payer: Self-pay | Admitting: Family Medicine

## 2016-11-18 NOTE — Telephone Encounter (Signed)
Patient wanting to know results of recent lab results.  Please Advise.  Thank you

## 2016-11-18 NOTE — Telephone Encounter (Signed)
Called and let the patient know about her lab results according to the telephone message and letter that was sent to her regarding her labs.

## 2016-11-19 ENCOUNTER — Ambulatory Visit: Payer: Medicaid Other | Admitting: Physical Therapy

## 2016-11-24 ENCOUNTER — Ambulatory Visit: Payer: Medicaid Other

## 2016-11-24 ENCOUNTER — Encounter: Payer: Medicaid Other | Admitting: Occupational Therapy

## 2016-12-01 ENCOUNTER — Encounter: Payer: Medicaid Other | Admitting: Occupational Therapy

## 2016-12-04 ENCOUNTER — Other Ambulatory Visit: Payer: Medicaid Other

## 2016-12-04 ENCOUNTER — Other Ambulatory Visit: Payer: Self-pay | Admitting: Family Medicine

## 2016-12-04 ENCOUNTER — Telehealth: Payer: Self-pay | Admitting: Family Medicine

## 2016-12-04 DIAGNOSIS — Z111 Encounter for screening for respiratory tuberculosis: Secondary | ICD-10-CM

## 2016-12-04 NOTE — Telephone Encounter (Signed)
Patient is scheduled at 1:45 for tb test but is wanting to know if she could come in at 4 instead.  I told her I would have to check first to see.  Is this just a lab appt or does it need to be on providers schedule?   Please advise thanks

## 2016-12-04 NOTE — Telephone Encounter (Signed)
I think she's supposed to have a follow up with me around now. It looks like she is scheduled to see Fleet ContrasRachel at 1:45 today for an appointment. She does need an appointment, but if she would like to reschedule that, we can have her come in for just a lab drawn at 4- but she will need a follow up appointment soon

## 2016-12-04 NOTE — Telephone Encounter (Signed)
Tried to reach patient with this information at the numbers listed in her chart.  Left a message for her to return my call.

## 2016-12-04 NOTE — Telephone Encounter (Signed)
It is just a blood drawl, it is fine for her to come in at 4.

## 2016-12-04 NOTE — Telephone Encounter (Signed)
Spoke with Prestyn. She will be coming in at 4 just for the lab. I informed her she needed to make a f/u appt with Dr Laural BenesJohnson within the next few weeks.

## 2016-12-08 ENCOUNTER — Encounter: Payer: Medicaid Other | Admitting: Occupational Therapy

## 2016-12-09 ENCOUNTER — Encounter: Payer: Self-pay | Admitting: Family Medicine

## 2016-12-09 ENCOUNTER — Telehealth: Payer: Self-pay | Admitting: Family Medicine

## 2016-12-09 LAB — QUANTIFERON IN TUBE
QFT TB AG MINUS NIL VALUE: 0.04 IU/mL
QUANTIFERON MITOGEN VALUE: >10 IU/mL
QUANTIFERON TB AG VALUE: 0.15 IU/mL
QUANTIFERON TB GOLD: NEGATIVE
Quantiferon Nil Value: 0.11 IU/mL

## 2016-12-09 LAB — QUANTIFERON TB GOLD ASSAY (BLOOD)

## 2016-12-09 NOTE — Telephone Encounter (Signed)
Patient called to get her results from lab test. Per [name] it was negative. Gave patient the information

## 2016-12-11 ENCOUNTER — Telehealth: Payer: Self-pay | Admitting: Family Medicine

## 2016-12-11 NOTE — Telephone Encounter (Signed)
FYI: Patient called to get a copy of her Tb Blood test results from 12/04/2016.  Results printed out and put in files at front desk.   Patient is aware.

## 2016-12-15 ENCOUNTER — Encounter: Payer: Medicaid Other | Admitting: Occupational Therapy

## 2016-12-21 ENCOUNTER — Encounter: Payer: Medicaid Other | Admitting: Occupational Therapy

## 2016-12-22 ENCOUNTER — Ambulatory Visit: Payer: Medicaid Other | Admitting: Family Medicine

## 2016-12-25 ENCOUNTER — Ambulatory Visit: Payer: Medicaid Other | Admitting: Family Medicine

## 2016-12-30 ENCOUNTER — Ambulatory Visit: Payer: Medicaid Other | Admitting: Internal Medicine

## 2016-12-31 ENCOUNTER — Ambulatory Visit: Payer: Medicaid Other | Admitting: Family Medicine

## 2016-12-31 ENCOUNTER — Encounter: Payer: Self-pay | Admitting: Family Medicine

## 2017-01-05 ENCOUNTER — Telehealth: Payer: Self-pay | Admitting: Family Medicine

## 2017-01-05 ENCOUNTER — Ambulatory Visit (INDEPENDENT_AMBULATORY_CARE_PROVIDER_SITE_OTHER): Payer: Medicaid Other | Admitting: Family Medicine

## 2017-01-05 ENCOUNTER — Encounter: Payer: Self-pay | Admitting: Family Medicine

## 2017-01-05 VITALS — BP 116/84 | HR 71 | Temp 98.7°F | Wt 209.0 lb

## 2017-01-05 DIAGNOSIS — E039 Hypothyroidism, unspecified: Secondary | ICD-10-CM

## 2017-01-05 DIAGNOSIS — R229 Localized swelling, mass and lump, unspecified: Secondary | ICD-10-CM | POA: Diagnosis not present

## 2017-01-05 DIAGNOSIS — R1084 Generalized abdominal pain: Secondary | ICD-10-CM

## 2017-01-05 DIAGNOSIS — Z23 Encounter for immunization: Secondary | ICD-10-CM | POA: Diagnosis not present

## 2017-01-05 DIAGNOSIS — IMO0002 Reserved for concepts with insufficient information to code with codable children: Secondary | ICD-10-CM

## 2017-01-05 LAB — UA/M W/RFLX CULTURE, ROUTINE
BILIRUBIN UA: NEGATIVE
Glucose, UA: NEGATIVE
Ketones, UA: NEGATIVE
Leukocytes, UA: NEGATIVE
Nitrite, UA: NEGATIVE
PH UA: 5.5 (ref 5.0–7.5)
PROTEIN UA: NEGATIVE
RBC UA: NEGATIVE
Specific Gravity, UA: 1.02 (ref 1.005–1.030)
UUROB: 0.2 mg/dL (ref 0.2–1.0)

## 2017-01-05 LAB — MICROSCOPIC EXAMINATION
Bacteria, UA: NONE SEEN
RBC, UA: NONE SEEN /hpf (ref 0–?)

## 2017-01-05 MED ORDER — OMEPRAZOLE 40 MG PO CPDR: 40.0000 mg | DELAYED_RELEASE_CAPSULE | Freq: Every day | ORAL | 3 refills | Status: DC

## 2017-01-05 NOTE — Progress Notes (Signed)
BP 116/84   Pulse 71   Temp 98.7 F (37.1 C)   Wt 209 lb (94.8 kg)   SpO2 98%   BMI 34.78 kg/m    Subjective:    Patient ID: Margaret [name], female    DOB: 04-28-1970, 46 y.o.   MRN: [id number]  HPI: Margaret [name] is a 46 y.o. female  Chief Complaint  Patient presents with  . Hypothyroidism    recheck  . Knot    right upper leg for a "couple of weeks" States it isn't getting larger and isn't painful.   Patient presents today for thyroid recheck and to have several small masses evaluated that have popped up the last few weeks. Synthroid decreased to 125 mcg 6 weeks ago as TSH was low. Still having fatigue sxs but no diarrhea, constipation, skin changes, palpitations, CP, SOB. Compliant with medication, no side effects reported.   Notes a "knot" on her right thigh anteriorly. Non-tender, no skin changes noted, no known trauma to the area. Has also been monitoring a mass that came up near her right elbow that does have some bruising associated, and a smaller firm mass that came up on right chest. No injury known to any area. Has not been trying anything at home for these.   Also wanting to discuss several weeks of abdominal bloating and discomfort. Feels some in lower abdomen as well as epigastric area. Notes some urinary frequency when prompted. No known hx of reflux, denies N/V/D or constipation, fevers, chills. Has not tried anything OTC.  Past Medical History:  Diagnosis Date  . Asthma    controlled  . Atrial fibrillation (HCC) 09/2016  . Cryptogenic stroke (HCC) 09/23/2016  . CVA (cerebral vascular accident) (HCC)   . PFO (patent foramen ovale)   . Thyroid disease    Social History   Social History  . Marital status: Divorced    Spouse name: N/A  . Number of children: 2  . Years of education: 12   Occupational History  . [employer]    Social History Main Topics  . Smoking status: Current Every Day Smoker    Packs/day: 0.25    Types: Cigarettes    . Smokeless tobacco: Former Neurosurgeon    Types: Chew     Comment: trying to quit; going to start using patches;   . Alcohol use 0.0 oz/week     Comment: on occasion  . Drug use: Yes    Types: Marijuana     Comment: Previously, but stopped years ago  . Sexual activity: Yes    Partners: Male    Birth control/ protection: Condom   Other Topics Concern  . Not on file   Social History Narrative   Lives with daughter   Caffeine use: daily   Right handed    Relevant past medical, surgical, family and social history reviewed and updated as indicated. Interim medical history since our last visit reviewed. Allergies and medications reviewed and updated.  Review of Systems  Constitutional: Positive for fatigue.  HENT: Negative.   Respiratory: Negative.   Gastrointestinal: Positive for abdominal pain (bloating).  Genitourinary: Positive for frequency.  Musculoskeletal: Negative.   Neurological: Negative.   Psychiatric/Behavioral: Negative.     Per HPI unless specifically indicated above     Objective:    BP 116/84   Pulse 71   Temp 98.7 F (37.1 C)   Wt 209 lb (94.8 kg)   SpO2 98%   BMI 34.78 kg/m  Wt Readings from Last 3 Encounters:  01/05/17 209 lb (94.8 kg)  10/29/16 209 lb 8 oz (95 kg)  10/20/16 211 lb 8 oz (95.9 kg)    Physical Exam  Constitutional: She is oriented to person, place, and time. She appears well-developed and well-nourished. No distress.  HENT:  Head: Atraumatic.  Eyes: Pupils are equal, round, and reactive to light. Conjunctivae are normal. No scleral icterus.  Neck: Normal range of motion. Neck supple. No thyromegaly present.  Cardiovascular: Normal rate and normal heart sounds.   Pulmonary/Chest: Effort normal and breath sounds normal. No respiratory distress.  Abdominal: Soft. Bowel sounds are normal. She exhibits no mass. There is tenderness (very mild, diffuse). There is no guarding.  Musculoskeletal: Normal range of motion.  Neurological: She  is alert and oriented to person, place, and time.  Skin: Skin is warm and dry.  2-3 inch mass right anterior leg above knee, feels lipomatous  1-2 cm mass right elbow with minimal bruising discoloration .5 cm firm mass right mid chest  Psychiatric: She has a normal mood and affect. Her behavior is normal.  Nursing note and vitals reviewed.   Results for orders placed or performed in visit on 01/05/17  Microscopic Examination  Result Value Ref Range   WBC, UA 0-5 0 - 5 /hpf   RBC, UA None seen 0 - 2 /hpf   Epithelial Cells (non renal) 0-10 0 - 10 /hpf   Bacteria, UA None seen None seen/Few  TSH  Result Value Ref Range   TSH 0.084 (L) 0.450 - 4.500 uIU/mL  UA/M w/rflx Culture, Routine  Result Value Ref Range   Specific Gravity, UA 1.020 1.005 - 1.030   pH, UA 5.5 5.0 - 7.5   Color, UA Yellow Yellow   Appearance Ur Clear Clear   Leukocytes, UA Negative Negative   Protein, UA Negative Negative/Trace   Glucose, UA Negative Negative   Ketones, UA Negative Negative   RBC, UA Negative Negative   Bilirubin, UA Negative Negative   Urobilinogen, Ur 0.2 0.2 - 1.0 mg/dL   Nitrite, UA Negative Negative   Microscopic Examination See below:       Assessment & Plan:   Problem List Items Addressed This Visit      Endocrine   Thyroid activity decreased - Primary    Will recheck TSH today and adjust synthroid as needed from there      Relevant Orders   TSH (Completed)     Other   Abdominal pain   Relevant Orders   UA/M w/rflx Culture, Routine (Completed)    Other Visit Diagnoses    Mass       Will refer to general surgery for further evaluation and management   Relevant Orders   Ambulatory referral to General Surgery   Need for immunization against influenza       Relevant Orders   Flu Vaccine QUAD 36+ mos IM (Completed)       Follow up plan: Return for General surgery consult.

## 2017-01-05 NOTE — Telephone Encounter (Signed)
Patient called to check on lab results from todays visit.  Please Advise.  Thank you

## 2017-01-06 LAB — TSH: TSH: 0.084 u[IU]/mL — AB (ref 0.450–4.500)

## 2017-01-06 MED ORDER — LEVOTHYROXINE SODIUM 100 MCG PO TABS
100.0000 ug | ORAL_TABLET | Freq: Every day | ORAL | 3 refills | Status: DC
Start: 2017-01-06 — End: 2019-01-04

## 2017-01-06 NOTE — Telephone Encounter (Signed)
Routing to provider  

## 2017-01-06 NOTE — Telephone Encounter (Signed)
Please call and let her know that her thyroid levels are still low and I have sent over 100 mcg that she should start instead of continuing the 125 mcg synthroid. I have also placed a referral to Endocrinology for her to help her manage this as her levels continue to worsen despite decreasing medications

## 2017-01-06 NOTE — Telephone Encounter (Signed)
Left message to call.

## 2017-01-07 NOTE — Patient Instructions (Signed)
Follow up as needed

## 2017-01-07 NOTE — Assessment & Plan Note (Signed)
Will recheck TSH today and adjust synthroid as needed from there

## 2017-01-07 NOTE — Telephone Encounter (Signed)
Patient notified

## 2017-01-08 ENCOUNTER — Ambulatory Visit: Payer: Self-pay | Admitting: Podiatry

## 2017-01-11 ENCOUNTER — Encounter: Payer: Self-pay | Admitting: *Deleted

## 2017-01-13 ENCOUNTER — Ambulatory Visit: Payer: Medicaid Other | Admitting: General Surgery

## 2017-01-20 ENCOUNTER — Encounter: Payer: Self-pay | Admitting: Internal Medicine

## 2017-01-20 ENCOUNTER — Ambulatory Visit (INDEPENDENT_AMBULATORY_CARE_PROVIDER_SITE_OTHER): Payer: Medicaid Other | Admitting: Internal Medicine

## 2017-01-20 VITALS — BP 100/78 | HR 67 | Ht 65.5 in | Wt 212.0 lb

## 2017-01-20 DIAGNOSIS — Q2112 Patent foramen ovale: Secondary | ICD-10-CM

## 2017-01-20 DIAGNOSIS — Z8673 Personal history of transient ischemic attack (TIA), and cerebral infarction without residual deficits: Secondary | ICD-10-CM | POA: Diagnosis not present

## 2017-01-20 DIAGNOSIS — I480 Paroxysmal atrial fibrillation: Secondary | ICD-10-CM

## 2017-01-20 DIAGNOSIS — Q211 Atrial septal defect: Secondary | ICD-10-CM

## 2017-01-20 MED ORDER — APIXABAN 5 MG PO TABS: 5.0000 mg | ORAL_TABLET | Freq: Two times a day (BID) | ORAL | 3 refills | Status: DC

## 2017-01-20 NOTE — Patient Instructions (Signed)
Medication Instructions:  Your physician has recommended you make the following change in your medication:  STOP taking xarelto.  START taking Eliquis 5mg  twice daily.   Labwork: none  Testing/Procedures: none  Follow-Up: Your physician wants you to follow-up in: 6 months with Dr. Okey DupreEnd.  You will receive a reminder letter in the mail two months in advance. If you don't receive a letter, please call our office to schedule the follow-up appointment.   Any Other Special Instructions Will Be Listed Below (If Applicable).     If you need a refill on your cardiac medications before your next appointment, please call your pharmacy.

## 2017-01-20 NOTE — Progress Notes (Signed)
Follow-up Outpatient Visit Date: 01/20/2017  Primary Care Provider: Dorcas Carrow, DO [address] [place] [address]  Chief Complaint: Follow-up paroxysmal atrial fibrillation and stroke  HPI:  Margaret [name] is a 46 y.o. year-old female with history of paroxysmal atrial fibrillation, stroke, and small PFO, who presents for follow-up of of a-fib and prior stroke. I last saw Margaret [name] in mid July after atrial fibrillation was noted on an event monitor. She was started on rivaroxaban and maintained on low-dose metoprolol, though she was concerned about some fatigue that may have been related to initiation of beta blockade.  Today, Margaret [name] reports that she has been feeling fairly well. She noticed a knot on the right thigh a few weeks ago and is scheduled for general surgery evaluation tomorrow. She wonders if this could be related to rivaroxaban use. She otherwise has been tolerating the medication well without bleeding. She continues to have some fatigue, not significantly changed from her prior visit. She also notes occasional word finding difficulty that has been present ever since her stroke. She denies new focal neurologic deficits. Margaret [name] also denies any palpitations, chest pain, shortness of breath, lightheadedness, and edema. She reports occasional bloating. When asked if she could be pregnant, Margaret [name] states that she is not currently on any birth control medication and inconsistently uses condoms.  Margaret [name] also reports intermittent headaches over the last month. She describes right frontal and midline headaches without photophobia that resolve with acetaminophen/diphenhydramine.  --------------------------------------------------------------------------------------------------  Cardiovascular History & Procedures: Cardiovascular Problems:  Stroke  Paroxysmal atrial fibrillation  Small PFO  Risk Factors:  Stroke  Cath/PCI:  None  CV Surgery:  None  EP  Procedures and Devices:  30-day event monitor (09/16/16): Predominantly sinus rhythm with isolated PACs and PVCs. 2 narrow complex tachycardia episodes concerning for atrial fibrillation +/- atrial flutter or SVT. No prolonged pauses.  Non-Invasive Evaluation(s):  Transcranial Doppler (10/05/16): Small right-to-left shunt with Valsalva.  TEE (09/08/16): Normal LV size and wall thickness. Normal systolic function. Trivial aortic regurgitation. Mild MR. No left atrial thrombus. No right atrial thrombus. Atrial septum with question of small right to left shunt, with appearance of small number of bubbles in the left ventricle after injection of agitated saline.  TTE (09/07/16): Normal LV size and wall thickness. LVEF 60-65% with normal wall motion. Grade 1 diastolic dysfunction. Normal RV size and function. No significant valvular abnormalities.  Recent CV Pertinent Labs: Lab Results  Component Value Date   CHOL 99 (L) 11/12/2016   CHOL 140 09/04/2013   HDL 37 (L) 11/12/2016   HDL 30 (L) 09/04/2013   LDLCALC 33 11/12/2016   LDLCALC 83 09/04/2013   TRIG 143 11/12/2016   TRIG 137 09/04/2013   CHOLHDL 5.1 09/07/2016   INR 1.13 09/06/2016   INR 1.2 09/04/2013   K 3.7 11/12/2016   K 3.8 09/04/2013   MG 1.8 09/02/2013   BUN 6 11/12/2016   BUN 11 09/04/2013   CREATININE 0.76 11/12/2016   CREATININE 0.81 09/04/2013    Past medical and surgical history were reviewed and updated in EPIC.  Current Meds  Medication Sig  . atorvastatin (LIPITOR) 40 MG tablet Take 1 tablet (40 mg total) by mouth daily at 6 PM.  . buPROPion (WELLBUTRIN SR) 150 MG 12 hr tablet 1 pill qAM for 1 week, then 1 pill 2x a day after that  . levothyroxine (SYNTHROID, LEVOTHROID) 100 MCG tablet Take 1 tablet (100 mcg total) by mouth daily.  Marland Kitchen  metoprolol tartrate (LOPRESSOR) 25 MG tablet Take 1 tablet (25 mg total) by mouth 2 (two) times daily.  Marland Kitchen. omeprazole (PRILOSEC) 40 MG capsule Take 1 capsule (40 mg total) by mouth  daily.  . [DISCONTINUED] rivaroxaban (XARELTO) 20 MG TABS tablet Take 1 tablet (20 mg total) by mouth daily with supper.    Allergies: Patient has no known allergies.  Social History   Social History  . Marital status: Divorced    Spouse name: N/A  . Number of children: 2  . Years of education: 12   Occupational History  . [employer]    Social History Main Topics  . Smoking status: Current Every Day Smoker    Packs/day: 0.25    Types: Cigarettes  . Smokeless tobacco: Former NeurosurgeonUser    Types: Chew     Comment: trying to quit; going to start using patches;   . Alcohol use 0.0 oz/week     Comment: on occasion  . Drug use: Yes    Types: Marijuana     Comment: Previously, but stopped years ago  . Sexual activity: Yes    Partners: Male    Birth control/ protection: Condom   Other Topics Concern  . Not on file   Social History Narrative   Lives with daughter   Caffeine use: daily   Right handed    Family History  Problem Relation Age of Onset  . Stroke Father     Review of Systems: A 12-system review of systems was performed and was negative except as noted in the HPI.  --------------------------------------------------------------------------------------------------  Physical Exam: BP 100/78 (BP Location: Left Arm, Patient Position: Sitting, Cuff Size: Large)   Pulse 67   Ht 5' 5.5" (1.664 m)   Wt 212 lb (96.2 kg)   BMI 34.74 kg/m   General:  Obese woman seated comfortably in the exam room. She is accompanied by a toddler. HEENT: No conjunctival pallor or scleral icterus. Moist mucous membranes.  OP clear. Neck: Supple without lymphadenopathy, thyromegaly, JVD, or HJR. Lungs: Normal work of breathing. Clear to auscultation bilaterally without wheezes or crackles. Heart: Regular rate and rhythm without murmurs, rubs, or gallops. Non-displaced PMI. Abd: Bowel sounds present. Soft, NT/ND without hepatosplenomegaly Ext: No lower extremity edema. Radial, PT,  and DP pulses are 2+ bilaterally. Skin: Warm and dry without rash.  EKG:  Normal sinus rhythm with nonspecific T-wave changes. No significant change from prior tracing on 10/20/16.  Lab Results  Component Value Date   WBC 12.2 (H) 10/04/2016   HGB 12.4 10/04/2016   HCT 37.0 10/04/2016   MCV 83.2 10/04/2016   PLT 333 10/04/2016    Lab Results  Component Value Date   NA 144 11/12/2016   K 3.7 11/12/2016   CL 105 11/12/2016   CO2 24 11/12/2016   BUN 6 11/12/2016   CREATININE 0.76 11/12/2016   GLUCOSE 92 11/12/2016   ALT 14 11/12/2016    Lab Results  Component Value Date   CHOL 99 (L) 11/12/2016   HDL 37 (L) 11/12/2016   LDLCALC 33 11/12/2016   TRIG 143 11/12/2016   CHOLHDL 5.1 09/07/2016   --------------------------------------------------------------------------------------------------  ASSESSMENT AND PLAN: Paroxysmal atrial fibrillation Margaret [name] continues to have mild fatigue, which may be related to metoprolol. We discussed switching to an alternative beta blocker or calcium channel blocker, but she does not wish to make any changes today. She seems to be tolerating rivaroxaban well but asked to be switched to a different agent. She  wonders if maybe the lump on her thigh is related to the medication. I stressed the importance of continued anticoagulation, given her stroke and CHADSVASC score of 3. She wishes to try different agent. We have discussed apixaban, dabigitran, and warfarin and have agreed to switch rivaroxaban to apixaban 5 mg twice a day. Given bloating and inconsistent condom use, I recommended that we check a pregnancy test today. Margaret [name] declined. I suggested that she do a home pregnancy test or speak to her primary care provider soon as possible, as we would need to make medication changes were she to become pregnant.  History of stroke No new neurologic symptoms. Margaret [name] continues to have some word finding difficulty at times. She also reports an  intermittent right frontal headache. I suggested that she follow-up with Dr. Anne Hahn (neurology) for further assessment. Continue statin therapy. Smoking cessation encouraged.  PFO Very small PFO noted by TEE and transcranial Doppler. Given that Margaret [name] will be on lifelong anticoagulation, we will defer intervention at this time.  Follow-up: Return to clinic in 6 months.  [name], MD 01/20/2017 4:15 PM

## 2017-01-21 ENCOUNTER — Ambulatory Visit (INDEPENDENT_AMBULATORY_CARE_PROVIDER_SITE_OTHER): Payer: Medicaid Other | Admitting: General Surgery

## 2017-01-21 ENCOUNTER — Encounter: Payer: Self-pay | Admitting: *Deleted

## 2017-01-21 ENCOUNTER — Encounter: Payer: Self-pay | Admitting: General Surgery

## 2017-01-21 ENCOUNTER — Telehealth: Payer: Self-pay | Admitting: Internal Medicine

## 2017-01-21 VITALS — BP 124/82 | HR 80 | Resp 12 | Ht 65.0 in | Wt 212.0 lb

## 2017-01-21 DIAGNOSIS — L728 Other follicular cysts of the skin and subcutaneous tissue: Secondary | ICD-10-CM | POA: Diagnosis not present

## 2017-01-21 DIAGNOSIS — L729 Follicular cyst of the skin and subcutaneous tissue, unspecified: Secondary | ICD-10-CM

## 2017-01-21 NOTE — Telephone Encounter (Signed)
Dr. Luan MooreSankar's office calling for clearance to hold Eliquis for an in office procedure on elbow

## 2017-01-21 NOTE — Patient Instructions (Signed)
Patient to return for excision of right elbow.  The patient is aware to call back for any questions or concerns.

## 2017-01-21 NOTE — Progress Notes (Signed)
Patient ID: Margaret Garibaldionia Lynette Merten, female   DOB: 11/04/1970, 46 y.o.   MRN: [id number]  Chief Complaint  Patient presents with  . Other    HPI Margaret [name] is a 46 y.o. female here today for a evaluation lumps on arm,chest right thigh. Patient states she noticed them about a month ago. No pain. She states the only one that is a problem is the one on her right elbow. Patient states she had a mini stroke in June.   HPI  Past Medical History:  Diagnosis Date  . Asthma    controlled  . Atrial fibrillation (HCC) 09/2016  . Cryptogenic stroke (HCC) 09/23/2016  . CVA (cerebral vascular accident) (HCC)   . PFO (patent foramen ovale)   . Thyroid disease     Past Surgical History:  Procedure Laterality Date  . FRACTURE SURGERY    . TEE WITHOUT CARDIOVERSION N/A 09/08/2016   Procedure: TRANSESOPHAGEAL ECHOCARDIOGRAM (TEE);  Surgeon: Yvonne KendallEnd, Christopher, MD;  Location: [facility] ORS;  Service: Cardiovascular;  Laterality: N/A;    Family History  Problem Relation Age of Onset  . Stroke Father     Social History Social History  Substance Use Topics  . Smoking status: Current Every Day Smoker    Packs/day: 0.25    Types: Cigarettes  . Smokeless tobacco: Former NeurosurgeonUser    Types: Chew     Comment: trying to quit; going to start using patches;   . Alcohol use 0.0 oz/week     Comment: on occasion    No Known Allergies  Current Outpatient Prescriptions  Medication Sig Dispense Refill  . apixaban (ELIQUIS) 5 MG TABS tablet Take 1 tablet (5 mg total) by mouth 2 (two) times daily. 60 tablet 3  . atorvastatin (LIPITOR) 40 MG tablet Take 1 tablet (40 mg total) by mouth daily at 6 PM. 30 tablet 6  . buPROPion (WELLBUTRIN SR) 150 MG 12 hr tablet 1 pill qAM for 1 week, then 1 pill 2x a day after that 60 tablet 3  . levothyroxine (SYNTHROID, LEVOTHROID) 100 MCG tablet Take 1 tablet (100 mcg total) by mouth daily. 90 tablet 3  . metoprolol tartrate (LOPRESSOR) 25 MG tablet Take 1 tablet (25 mg total)  by mouth 2 (two) times daily. 60 tablet 11  . omeprazole (PRILOSEC) 40 MG capsule Take 1 capsule (40 mg total) by mouth daily. 30 capsule 3   No current facility-administered medications for this visit.     Review of Systems Review of Systems  Constitutional: Negative.   Respiratory: Negative.   Cardiovascular: Negative.     Blood pressure 124/82, pulse 80, resp. rate 12, height 5\' 5"  (1.651 m), weight 212 lb (96.2 kg).  Physical Exam Physical Exam  Constitutional: She is oriented to person, place, and time. She appears well-developed and well-nourished.  Cardiovascular: Normal rate and normal heart sounds.  An irregular rhythm present.  Pulmonary/Chest: Effort normal and breath sounds normal.  Neurological: She is alert and oriented to person, place, and time.  Skin: Skin is warm and dry.       Data Reviewed Notes reviewed  Assessment    subcutaneous mass on right elbow, symptomatic, likely a lipoma. Discussed removal and she is agreeable  Right chest wall skin cyst.  and right thigh lipoma- not symptomatic     Plan   Excision lipoma right elbow  The patient is aware to call back for any questions or concerns.     HPI, Physical Exam, Assessment and Plan have  been scribed under the direction and in the presence of Kathreen Cosier, MD  Ples Specter, CMA  I have completed the exam and reviewed the above documentation for accuracy and completeness.  I agree with the above.  Museum/gallery conservator has been used and any errors in dictation or transcription are unintentional.  Amaad Byers G. Evette Cristal, M.D., F.A.C.S.   Gerlene Burdock G 01/25/2017, 2:56 PM

## 2017-01-21 NOTE — Telephone Encounter (Signed)
S/w Dr. Luan MooreSankar's office. Pt scheduled to have 2-3 subcutaneous masses removed off right elbow; in-office procedure  Requesting clearance including need to hold Eliquis.    Per Dr. Luan MooreSankar's OV notes: "Patient to return for excision of right elbow.  Patient will need clearances from Dr. Okey DupreEnd.The patient is aware to call back for any questions or concerns." Routed to Dr. Okey DupreEnd

## 2017-01-22 NOTE — Telephone Encounter (Signed)
Patient is fine to proceed with surgery to her subcutaneous masses on the right elbow. I would like to minimize time off anticoagulation, given history of CVA, PAF, and PFO. Apixaban should be held for 36 hours prior to surgery and restarted as soon as possible from a post-surgical standpoint. Thanks.  Yvonne Kendallhristopher Anacarolina Evelyn, MD [facility] Pager: [phone]

## 2017-01-22 NOTE — Telephone Encounter (Signed)
Routed to Dr. Luan MooreSankar's office, [phone]

## 2017-02-16 ENCOUNTER — Encounter: Payer: Medicaid Other | Admitting: Podiatry

## 2017-02-20 NOTE — Progress Notes (Signed)
This encounter was created in error - please disregard.

## 2017-03-21 ENCOUNTER — Encounter: Payer: Self-pay | Admitting: Emergency Medicine

## 2017-03-21 ENCOUNTER — Emergency Department
Admission: EM | Admit: 2017-03-21 | Discharge: 2017-03-21 | Disposition: A | Discharge status: Home / Self Care | Payer: Medicaid Other | Attending: Student in an Organized Health Care Education/Training Program | Admitting: Student in an Organized Health Care Education/Training Program

## 2017-03-21 ENCOUNTER — Other Ambulatory Visit: Payer: Self-pay

## 2017-03-21 DIAGNOSIS — Z79899 Other long term (current) drug therapy: Secondary | ICD-10-CM | POA: Diagnosis not present

## 2017-03-21 DIAGNOSIS — K429 Umbilical hernia without obstruction or gangrene: Secondary | ICD-10-CM | POA: Insufficient documentation

## 2017-03-21 DIAGNOSIS — Q211 Atrial septal defect: Secondary | ICD-10-CM | POA: Diagnosis not present

## 2017-03-21 DIAGNOSIS — F17210 Nicotine dependence, cigarettes, uncomplicated: Secondary | ICD-10-CM | POA: Diagnosis not present

## 2017-03-21 DIAGNOSIS — J45909 Unspecified asthma, uncomplicated: Secondary | ICD-10-CM | POA: Diagnosis not present

## 2017-03-21 DIAGNOSIS — Z8673 Personal history of transient ischemic attack (TIA), and cerebral infarction without residual deficits: Secondary | ICD-10-CM | POA: Diagnosis not present

## 2017-03-21 DIAGNOSIS — R1033 Periumbilical pain: Secondary | ICD-10-CM | POA: Diagnosis present

## 2017-03-21 DIAGNOSIS — Z7901 Long term (current) use of anticoagulants: Secondary | ICD-10-CM | POA: Insufficient documentation

## 2017-03-21 LAB — COMPREHENSIVE METABOLIC PANEL
ALBUMIN: 3.7 g/dL (ref 3.5–5.0)
ALT: 20 U/L (ref 14–54)
ANION GAP: 8 (ref 5–15)
AST: 26 U/L (ref 15–41)
Alkaline Phosphatase: 105 U/L (ref 38–126)
BUN: <5 mg/dL — ABNORMAL LOW (ref 6–20)
CO2: 27 mmol/L (ref 22–32)
Calcium: 8.9 mg/dL (ref 8.9–10.3)
Chloride: 107 mmol/L (ref 101–111)
Creatinine, Ser: 0.7 mg/dL (ref 0.44–1.00)
GFR calc Af Amer: >60 mL/min (ref >60)
GFR calc non Af Amer: >60 mL/min (ref >60)
GLUCOSE: 106 mg/dL — AB (ref 65–99)
POTASSIUM: 3.2 mmol/L — AB (ref 3.5–5.1)
SODIUM: 142 mmol/L (ref 135–145)
Total Bilirubin: 0.2 mg/dL — ABNORMAL LOW (ref 0.3–1.2)
Total Protein: 7.3 g/dL (ref 6.5–8.1)

## 2017-03-21 LAB — CBC
HEMATOCRIT: 38.9 % (ref 35.0–47.0)
HEMOGLOBIN: 13 g/dL (ref 12.0–16.0)
MCH: 27.8 pg (ref 26.0–34.0)
MCHC: 33.4 g/dL (ref 32.0–36.0)
MCV: 83 fL (ref 80.0–100.0)
Platelets: 314 10*3/uL (ref 150–440)
RBC: 4.68 MIL/uL (ref 3.80–5.20)
RDW: 14.9 % — ABNORMAL HIGH (ref 11.5–14.5)
WBC: 10.2 10*3/uL (ref 3.6–11.0)

## 2017-03-21 LAB — LIPASE, BLOOD: Lipase: 38 U/L (ref 11–51)

## 2017-03-21 MED ORDER — POLYETHYLENE GLYCOL 3350 17 G PO PACK: 17.0000 g | PACK | Freq: Every day | ORAL | 0 refills | Status: DC

## 2017-03-21 NOTE — ED Notes (Signed)
Dr in with pt - has an umbilical hernia which dr was able to reduce.

## 2017-03-21 NOTE — ED Triage Notes (Signed)
Pt c/o mid abdominal pain x 1 month. Pt states palpable "knot" to umbilical region at this time. Pt states she has been "gagging" at certain times. NAD noted at this time.

## 2017-03-21 NOTE — ED Provider Notes (Signed)
[facility] Emergency Department Provider Note    First MD Initiated Contact with Patient 03/21/17 1301     (approximate)  I have reviewed the triage vital signs and the nursing notes.   HISTORY  Chief Complaint Abdominal Pain    HPI Margaret [name] is a 46 y.o. female with no history of previous surgeries presents with a not and intermittent discomfort just above her umbilicus over the past month.  Denies any vomiting.  Denies any diarrhea.  She still passing gas.  Has never had this evaluated.  She is here with her daughter being evaluated for abscess on her right face so she went to be checked out as well.  She denies any active pain at this moment.  Past Medical History:  Diagnosis Date  . Asthma    controlled  . Atrial fibrillation (HCC) 09/2016  . Cryptogenic stroke (HCC) 09/23/2016  . CVA (cerebral vascular accident) (HCC)   . PFO (patent foramen ovale)   . Thyroid disease    Family History  Problem Relation Age of Onset  . Stroke Father    Past Surgical History:  Procedure Laterality Date  . FRACTURE SURGERY    . TEE WITHOUT CARDIOVERSION N/A 09/08/2016   Procedure: TRANSESOPHAGEAL ECHOCARDIOGRAM (TEE);  Surgeon: Yvonne KendallEnd, Christopher, MD;  Location: [facility] ORS;  Service: Cardiovascular;  Laterality: N/A;   Patient Active Problem List   Diagnosis Date Noted  . Paroxysmal atrial fibrillation (HCC) 10/29/2016  . PFO (patent foramen ovale) 09/23/2016  . Hyperlipidemia 09/17/2016  . IFG (impaired fasting glucose) 09/17/2016  . History of stroke 09/06/2016  . Myopia 12/20/2014  . Abdominal pain 12/20/2014  . Depression 12/20/2014  . Right arm pain 11/21/2014  . Thyroid activity decreased 10/18/2014  . Abnormal CBC 10/18/2014  . Tobacco abuse 10/18/2014      Prior to Admission medications   Medication Sig Start Date End Date Taking? Authorizing Provider  apixaban (ELIQUIS) 5 MG TABS tablet Take 1 tablet (5 mg total) by mouth 2 (two)  times daily. 01/20/17   End, Cristal Deerhristopher, MD  atorvastatin (LIPITOR) 40 MG tablet Take 1 tablet (40 mg total) by mouth daily at 6 PM. 10/29/16   Laural BenesJohnson, Megan P, DO  buPROPion [facility](WELLBUTRIN SR) 150 MG 12 hr tablet 1 pill qAM for 1 week, then 1 pill 2x a day after that 10/29/16   Olevia PerchesJohnson, Megan P, DO  levothyroxine (SYNTHROID, LEVOTHROID) 100 MCG tablet Take 1 tablet (100 mcg total) by mouth daily. 01/06/17   Particia NearingLane, Rachel Elizabeth, PA-C  metoprolol tartrate (LOPRESSOR) 25 MG tablet Take 1 tablet (25 mg total) by mouth 2 (two) times daily. 10/04/16 10/04/17  Emily FilbertWilliams, Jonathan E, MD  omeprazole (PRILOSEC) 40 MG capsule Take 1 capsule (40 mg total) by mouth daily. 01/05/17   Particia NearingLane, Rachel Elizabeth, PA-C  polyethylene glycol [facility](MIRALAX / Ethelene HalGLYCOLAX) packet Take 17 g by mouth daily. Mix one tablespoon with 8oz of your favorite juice or water every day until you are having soft formed stools. Then start taking once daily if you didn't have a stool the day before. 03/21/17   Willy Eddyobinson, Taren Dymek, MD    Allergies Patient has no known allergies.    Social History Social History   Tobacco Use  . Smoking status: Current Every Day Smoker    Packs/day: 0.25    Types: Cigarettes  . Smokeless tobacco: Former NeurosurgeonUser    Types: Snuff  . Tobacco comment: trying to quit; going to start using patches;   Substance  Use Topics  . Alcohol use: Yes    Alcohol/week: 0.0 oz    Comment: on occasion  . Drug use: Yes    Types: Marijuana    Comment: Previously, but stopped years ago    Review of Systems Patient denies headaches, rhinorrhea, blurry vision, numbness, shortness of breath, chest pain, edema, cough, abdominal pain, nausea, vomiting, diarrhea, dysuria, fevers, rashes or hallucinations unless otherwise stated above in HPI. ____________________________________________   PHYSICAL EXAM:  VITAL SIGNS: Vitals:   03/21/17 1208  BP: (!) 134/100  Pulse: 82  Resp: 13  Temp: 98.2 F (36.8 C)  SpO2: 100%     Constitutional: Alert and oriented. Well appearing and in no acute distress. Eyes: Conjunctivae are normal.  Head: Atraumatic. Nose: No congestion/rhinnorhea. Mouth/Throat: Mucous membranes are moist.   Neck: No stridor. Painless ROM.  Cardiovascular: Normal rate, regular rhythm. Grossly normal heart sounds.  Good peripheral circulation. Respiratory: Normal respiratory effort.  No retractions. Lungs CTAB. Gastrointestinal: Soft and nontender. No distention. No abdominal bruits. No CVA tenderness.  There is a periumbilical nontender reducible hernia with no overlying cellulitis. Genitourinary:  Musculoskeletal: No lower extremity tenderness nor edema.  No joint effusions. Neurologic:  Normal speech and language. No gross focal neurologic deficits are appreciated. No facial droop Skin:  Skin is warm, dry and intact. No rash noted. Psychiatric: Mood and affect are normal. Speech and behavior are normal.  ____________________________________________   LABS (all labs ordered are listed, but only abnormal results are displayed)  Results for orders placed or performed during the hospital encounter of 03/21/17 (from the past 24 hour(s))  Lipase, blood     Status: None   Collection Time: 03/21/17 12:13 PM  Result Value Ref Range   Lipase 38 11 - 51 U/L  Comprehensive metabolic panel     Status: Abnormal   Collection Time: 03/21/17 12:13 PM  Result Value Ref Range   Sodium 142 135 - 145 mmol/L   Potassium 3.2 (L) 3.5 - 5.1 mmol/L   Chloride 107 101 - 111 mmol/L   CO2 27 22 - 32 mmol/L   Glucose, Bld 106 (H) 65 - 99 mg/dL   BUN <5 (L) 6 - 20 mg/dL   Creatinine, Ser 1.61 0.44 - 1.00 mg/dL   Calcium 8.9 8.9 - 09.6 mg/dL   Total Protein 7.3 6.5 - 8.1 g/dL   Albumin 3.7 3.5 - 5.0 g/dL   AST 26 15 - 41 U/L   ALT 20 14 - 54 U/L   Alkaline Phosphatase 105 38 - 126 U/L   Total Bilirubin 0.2 (L) 0.3 - 1.2 mg/dL   GFR calc non Af Amer >60 >60 mL/min   GFR calc Af Amer >60 >60 mL/min    Anion gap 8 5 - 15  CBC     Status: Abnormal   Collection Time: 03/21/17 12:13 PM  Result Value Ref Range   WBC 10.2 3.6 - 11.0 K/uL   RBC 4.68 3.80 - 5.20 MIL/uL   Hemoglobin 13.0 12.0 - 16.0 g/dL   HCT 04.5 40.9 - 81.1 %   MCV 83.0 80.0 - 100.0 fL   MCH 27.8 26.0 - 34.0 pg   MCHC 33.4 32.0 - 36.0 g/dL   RDW 91.4 (H) 78.2 - 95.6 %   Platelets 314 150 - 440 K/uL   ____________________________________________ ________________________________  RADIOLOGY  I personally reviewed all radiographic images ordered to evaluate for the above acute complaints and reviewed radiology reports and findings.  These findings were personally  discussed with the patient.  Please see medical record for radiology report.  ____________________________________________   PROCEDURES  Procedure(s) performed:  Procedures    Critical Care performed: no ____________________________________________   INITIAL IMPRESSION / ASSESSMENT AND PLAN / ED COURSE  Pertinent labs & imaging results that were available during my care of the patient were reviewed by me and considered in my medical decision making (see chart for details).  DDX: Related hernia, incarcerated hernia periumbilical hernia, musculoskeletal strain, abscess  Margaret [name] is a 46 y.o. who presents to the ED with reducible periumbilical hernia.  Patient is afebrile hematin amply stable.  Her abdominal exam is otherwise benign.  Discussed findings with patient and will give referral to outpatient surgery for elective evaluation.  Discussed signs and symptoms for which she should return immediately to the hospital.  Have discussed with the patient and available family all diagnostics and treatments performed thus far and all questions were answered to the best of my ability. The patient demonstrates understanding and agreement with plan.       ____________________________________________   FINAL CLINICAL IMPRESSION(S) / ED  DIAGNOSES  Final diagnoses:  Periumbilical hernia      NEW MEDICATIONS STARTED DURING THIS VISIT:  This SmartLink is deprecated. Use AVSMEDLIST instead to display the medication list for a patient.   Note:  This document was prepared using Dragon voice recognition software and may include unintentional dictation errors.    Willy Eddyobinson, Treyden Hakim, MD 03/21/17 [phone]

## 2017-04-27 ENCOUNTER — Ambulatory Visit: Payer: Medicaid Other | Admitting: Podiatry

## 2017-04-28 DIAGNOSIS — E042 Nontoxic multinodular goiter: Secondary | ICD-10-CM | POA: Insufficient documentation

## 2017-05-11 ENCOUNTER — Encounter: Payer: Medicaid Other | Admitting: Podiatry

## 2017-05-24 NOTE — Progress Notes (Signed)
This encounter was created in error - please disregard.

## 2017-05-27 ENCOUNTER — Telehealth: Payer: Self-pay | Admitting: *Deleted

## 2017-05-27 NOTE — Telephone Encounter (Signed)
Received "Alternative Medication Request" from patient's pharmacy re: Eliquis with message that "Prescribed medication is not on insurance formulary."  S/w pharmacist. They notified patient about 1 week ago that insurance had changed. Patient is on Medicaid. It is thought patient's plan may have changed to "family planning Medicaid" which does not cover any medications not r/t to family planning. Pharmacy instructed patient to call her social worker to identify the problem and work to fix it. So far the Eliquis still will not go through. Patient was receiving the Eliquis with no problem prior to this refill.

## 2017-05-27 NOTE — Telephone Encounter (Signed)
S/w patient. She said she just got her Medicaid reinstated this morning and she has received her Eliquis medication from her pharmacy. No further action needed.

## 2017-06-08 ENCOUNTER — Ambulatory Visit: Payer: Medicaid Other | Admitting: Podiatry

## 2017-06-15 ENCOUNTER — Encounter: Payer: Self-pay | Admitting: Podiatry

## 2017-06-15 ENCOUNTER — Ambulatory Visit: Payer: Medicaid Other | Admitting: Podiatry

## 2017-06-15 DIAGNOSIS — B351 Tinea unguium: Secondary | ICD-10-CM | POA: Diagnosis not present

## 2017-06-15 DIAGNOSIS — M79676 Pain in unspecified toe(s): Secondary | ICD-10-CM

## 2017-06-15 DIAGNOSIS — L603 Nail dystrophy: Secondary | ICD-10-CM

## 2017-06-16 NOTE — Progress Notes (Signed)
   Subjective: 47 year old female presenting today with a chief complaint of elongated, thickened bilateral great toenails. She reports associated tenderness of the nails. She has not done anything to treat the symptoms. Applying pressure to the toenails increases the pain. Patient is here for further evaluation and treatment.   Past Medical History:  Diagnosis Date  . Asthma    controlled  . Atrial fibrillation (HCC) 09/2016  . Cryptogenic stroke (HCC) 09/23/2016  . CVA (cerebral vascular accident) (HCC)   . PFO (patent foramen ovale)   . Thyroid disease     Objective: Physical Exam General: The patient is alert and oriented x3 in no acute distress.  Dermatology: Hyperkeratotic, discolored, thickened, onychodystrophy of great toenails noted bilaterally.  Skin is warm, dry and supple bilateral lower extremities. Negative for open lesions or macerations.  Vascular: Palpable pedal pulses bilaterally. No edema or erythema noted. Capillary refill within normal limits.  Neurological: Epicritic and protective threshold grossly intact bilaterally.   Musculoskeletal Exam: Range of motion within normal limits to all pedal and ankle joints bilateral. Muscle strength 5/5 in all groups bilateral.   Assessment: #1 dystrophic nails bilateral great toes #2 hyperkeratotic nails bilateral  Plan of Care:  #1 Patient was evaluated. #2 Mechanical debridement of great toenails bilaterally performed using a nail nipper. Filed with dremel without incident.  #3 Discussed temporary nail avulsion procedures. Patient is considering this.  #4 Return to clinic as needed.   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    Felecia ShellingBrent M. Jese Comella, DPM [facility]  Dr. Felecia ShellingBrent M. Lyriq Jarchow, DPM    [address]                                        [place], [place] [address]                Office [phone]  Fax [phone]

## 2017-07-30 ENCOUNTER — Telehealth: Payer: Self-pay

## 2017-07-30 NOTE — Telephone Encounter (Signed)
Please review for refill, Thanks !  

## 2017-08-02 MED ORDER — APIXABAN 5 MG PO TABS: 5.0000 mg | ORAL_TABLET | Freq: Two times a day (BID) | ORAL | 3 refills | Status: DC

## 2017-08-02 NOTE — Telephone Encounter (Signed)
Patient calling to check on status of refill on Eliquis  Stated it was in review for refill

## 2017-08-02 NOTE — Telephone Encounter (Signed)
Contacted patient. Refill was sent in this morning.   apixaban (ELIQUIS) 5 MG TABS tablet 60 tablet 3 08/02/2017    Sig - Route: Take 1 tablet (5 mg total) by mouth 2 (two) times daily. - Oral   Sent to pharmacy as: apixaban (ELIQUIS) 5 MG Tab tablet   E-Prescribing Status: Receipt confirmed by pharmacy (08/02/2017 8:08 AM EDT)   Ph[facility]acy   [facility] [address] - [place], [place] - [address]

## 2017-08-18 NOTE — Progress Notes (Signed)
Cardiology Office Note Date:  08/19/2017  Patient ID:  Margaret, [name] 06-10-1970, MRN [id number] PCP:  [facility], [facility]  Cardiologist:  Dr. Okey Dupre, MD    Chief Complaint: Follow-up  History of Present Illness: Margaret [name] is a 47 y.o. female with history of PAF on Eliquis, stroke, small PFO, hypothyroidism, asthma and polysubstance abuse with ongoing tobacco, alcohol and marijuana use who presents for follow-up of her A. fib.  Patient was admitted to the hospital in 09/2016 with a right frontal lobe stroke.  TTE on 09/07/2016 showed an EF of 60 to 65%, normal wall motion, grade 1 diastolic dysfunction.  Carotid ultrasound on 09/07/2016 showed less than 50% stenosis of the bilateral internal carotid arteries.  TEE on 09/08/2016 showed evidence of a very small atrial level right to left shunt by bubble study without visualization of ASD or PFO.  30-day event monitor and 09/2016 showed a predominant rhythm of sinus with isolated PACs and PVCs.  Two narrow complex tachycardia episodes concerning for A. Fib +/-atrial flutter or SVT.  No prolonged pauses.  Transcranial Doppler on 10/05/2016 showed a small right to left shunt with Valsalva.  Following the diagnosis of her A. fib, she was started on Xarelto and maintained on low-dose metoprolol.  However, the patient was concerned about some fatigue which she felt may have been related to the initiation of beta-blocker.  She was most recently seen in the office on 01/20/2017 for follow-up and was doing fairly well at that time.  She noted a knot on the right thigh several weeks prior to that appointment and was concerned that this may have been related to her Xarelto use.  She continued to note occasional word finding difficulty that had been present since her stroke.  Patient preferred to continue current dose of metoprolol at that time.  At the patient's request, she was transitioned from Xarelto to Eliquis.  Due to her abdominal  bloating, pregnancy test was recommended, however patient declined.  She was advised to follow-up with her PCP or take a home pregnancy test.  Patient was seen in the ED in 03/2017 for abdominal pain.  Labs were unrevealing outside of mild hypokalemia with a potassium of 3.2.  No imaging was performed.  Pregnancy test was not performed.  Patient was advised to follow-up as an outpatient.  She comes in doing well today from a cardiac perspective.  No episodes concerning for recurrence of A. fib.  No chest pain, shortness of breath, diaphoresis, nausea, vomiting, dizziness, presyncope, or syncope.  She continues to note difficulty finding her words though states this "comes and goes" and is unchanged from the time of her stroke.  She notes bilateral knee pain if she has been standing for an extended period of time.  She is yet to have the subcutaneous mass excised from the right elbow.  No recent falls.  No BRBPR or melena.  Past Medical History:  Diagnosis Date  . Asthma    controlled  . CVA (cerebral vascular accident) (HCC)   . Hypothyroidism   . PAF (paroxysmal atrial fibrillation) (HCC) 09/2016   a. 30-day event monitor 6/18: predominant rhythm of sinus with isolated PACs and PVCs.  To narrow complex tachycardia episodes concerning for A. Fib +/-atrial flutter or SVT.  No prolonged pauses; b. CHADS2VASc => 3 (stroke x 2, female); c. Eliquis  . PFO (patent foramen ovale)    a. TTE 6/18: EF of 60 to 65%, normal wall motion,  grade 1 diastolic dysfunction; b. TEE 6/18: evidence of a very small atrial level right to left shunt by bubble study without visualization of ASD or PFO    Past Surgical History:  Procedure Laterality Date  . FRACTURE SURGERY    . TEE WITHOUT CARDIOVERSION N/A 09/08/2016   Procedure: TRANSESOPHAGEAL ECHOCARDIOGRAM (TEE);  Surgeon: [name], MD;  Location: [facility] ORS;  Service: Cardiovascular;  Laterality: N/A;    Current Meds  Medication Sig  . apixaban (ELIQUIS) 5  MG TABS tablet Take 1 tablet (5 mg total) by mouth 2 (two) times daily.  Marland Kitchen atorvastatin (LIPITOR) 40 MG tablet Take 1 tablet (40 mg total) by mouth daily at 6 PM.  . buPROPion (WELLBUTRIN SR) 150 MG 12 hr tablet 1 pill qAM for 1 week, then 1 pill 2x a day after that  . levothyroxine (SYNTHROID, LEVOTHROID) 100 MCG tablet Take 1 tablet (100 mcg total) by mouth daily.  . metoprolol tartrate (LOPRESSOR) 25 MG tablet Take 1 tablet (25 mg total) by mouth 2 (two) times daily.  Marland Kitchen omeprazole (PRILOSEC) 40 MG capsule Take 1 capsule (40 mg total) by mouth daily.  . polyethylene glycol (MIRALAX / GLYCOLAX) packet Take 17 g by mouth daily. Mix one tablespoon with 8oz of your favorite juice or water every day until you are having soft formed stools. Then start taking once daily if you didn't have a stool the day before.    Allergies:   Patient has no known allergies.   Social History:  The patient  reports that she has been smoking cigarettes.  She has been smoking about 0.25 packs per day. She has quit using smokeless tobacco. Her smokeless tobacco use included snuff. She reports that she drinks alcohol. She reports that she has current or past drug history. Drug: Marijuana.   Family History:  The patient's family history includes Stroke in her father.  ROS:   Review of Systems  Constitutional: Positive for malaise/fatigue. Negative for chills, diaphoresis, fever and weight loss.  HENT: Negative for congestion.   Eyes: Negative for discharge and redness.  Respiratory: Negative for cough, hemoptysis, sputum production, shortness of breath and wheezing.   Cardiovascular: Negative for chest pain, palpitations, orthopnea, claudication, leg swelling and PND.  Gastrointestinal: Negative for abdominal pain, blood in stool, heartburn, melena, nausea and vomiting.  Genitourinary: Negative for hematuria.  Musculoskeletal: Negative for falls and myalgias.  Skin: Negative for rash.  Neurological: Negative for  dizziness, tingling, tremors, sensory change, speech change, focal weakness, loss of consciousness and weakness.  Endo/Heme/Allergies: Does not bruise/bleed easily.  Psychiatric/Behavioral: Negative for substance abuse. The patient is not nervous/anxious.      PHYSICAL EXAM:  VS:  BP (!) 130/96 (BP Location: Left Arm, Patient Position: Sitting, Cuff Size: Normal)   Pulse 72   Ht 5' 5.5" (1.664 m)   Wt 220 lb 8 oz (100 kg)   BMI 36.14 kg/m  BMI: Body mass index is 36.14 kg/m.  Physical Exam  Constitutional: She is oriented to person, place, and time. She appears well-developed and well-nourished.  HENT:  Head: Normocephalic and atraumatic.  Eyes: Right eye exhibits no discharge. Left eye exhibits no discharge.  Neck: Normal range of motion. No JVD present.  Cardiovascular: Normal rate, regular rhythm, S1 normal, S2 normal and normal heart sounds. Exam reveals no distant heart sounds, no friction rub, no midsystolic click and no opening snap.  No murmur heard. Pulses:      Posterior tibial pulses are 2+ on the  right side, and 2+ on the left side.  Pulmonary/Chest: Effort normal and breath sounds normal. No respiratory distress. She has no decreased breath sounds. She has no wheezes. She has no rales. She exhibits no tenderness.  Abdominal: Soft. She exhibits no distension. There is no tenderness.  Musculoskeletal: She exhibits no edema.  Neurological: She is alert and oriented to person, place, and time.  Skin: Skin is warm and dry. No cyanosis. Nails show no clubbing.  Psychiatric: She has a normal mood and affect. Her speech is normal and behavior is normal. Judgment and thought content normal.     EKG:  Was ordered and interpreted by me today. Shows NSR, 72 bpm, no acute st/t changes  Recent Labs: 01/05/2017: TSH 0.084 03/21/2017: ALT 20; BUN <5; Creatinine, Ser 0.70; Hemoglobin 13.0; Platelets 314; Potassium 3.2; Sodium 142  09/07/2016: Total CHOL/HDL Ratio 5.1; VLDL  11 11/12/2016: Cholesterol, Total 99; HDL 37; LDL Calculated 33; Triglycerides 143   CrCl cannot be calculated (Patient's most recent lab result is older than the maximum 21 days allowed.).   Wt Readings from Last 3 Encounters:  08/19/17 220 lb 8 oz (100 kg)  03/21/17 198 lb (89.8 kg)  01/21/17 212 lb (96.2 kg)     Other studies reviewed: Additional studies/records reviewed today include: summarized above  ASSESSMENT AND PLAN:  1. PAF: Currently in sinus rhythm with a heart rate of 72 bpm.  Continue Lopressor 25 mg twice daily and Eliquis 5 mg twice daily.  CHADS2VASc at least 3 (stroke x 2, female).  Should she decide to have the subcutaneous mass excised along the right elbow she would need to hold Eliquis for 48 hours prior to the procedure with ideal resumption of Eliquis 24 hours post procedure or when felt to be safe by the performing MD.  2. History of stroke: Followed by neurology. On Eliquis as above. LDL of 33 from 11/2016.  Continue Lipitor 40 mg daily.  3. PFO: Noted on TEE. Intervention has been deferred given the patient will be on lifelong anticoagulation.   4. Hypothyroidism: Two most recent TSH levels have been suppressed. Per PCP.   5. Hypokalemia: Check BMP as above, with recommendation to replete potassium to goal > 2.0.    6. Polysubstance abuse: Cessation is advised.   Disposition: F/u with Dr. Okey Dupre in 6 months.  Current medicines are reviewed at length with the patient today.  The patient did not have any concerns regarding medicines.  Signed, Eula Listen, PA-C 08/19/2017 8:34 AM     [facility] [place] [address] [place], [place] [address] [phone]

## 2017-08-19 ENCOUNTER — Telehealth: Payer: Self-pay | Admitting: Internal Medicine

## 2017-08-19 ENCOUNTER — Ambulatory Visit (INDEPENDENT_AMBULATORY_CARE_PROVIDER_SITE_OTHER): Payer: Medicaid Other | Admitting: Physician Assistant

## 2017-08-19 ENCOUNTER — Encounter: Payer: Self-pay | Admitting: Physician Assistant

## 2017-08-19 VITALS — BP 130/96 | HR 72 | Ht 65.5 in | Wt 220.5 lb

## 2017-08-19 DIAGNOSIS — Q211 Atrial septal defect: Secondary | ICD-10-CM

## 2017-08-19 DIAGNOSIS — E876 Hypokalemia: Secondary | ICD-10-CM | POA: Diagnosis not present

## 2017-08-19 DIAGNOSIS — Z8673 Personal history of transient ischemic attack (TIA), and cerebral infarction without residual deficits: Secondary | ICD-10-CM | POA: Diagnosis not present

## 2017-08-19 DIAGNOSIS — Q2112 Patent foramen ovale: Secondary | ICD-10-CM

## 2017-08-19 DIAGNOSIS — I480 Paroxysmal atrial fibrillation: Secondary | ICD-10-CM | POA: Diagnosis not present

## 2017-08-19 DIAGNOSIS — E039 Hypothyroidism, unspecified: Secondary | ICD-10-CM | POA: Diagnosis not present

## 2017-08-19 MED ORDER — OMEPRAZOLE 40 MG PO CPDR: 40.0000 mg | DELAYED_RELEASE_CAPSULE | Freq: Every day | ORAL | 1 refills | Status: DC

## 2017-08-19 NOTE — Patient Instructions (Signed)
Medication Instructions:  Your physician recommends that you continue on your current medications as directed. Please refer to the Current Medication list given to you today.   Labwork: Your physician recommends that you return for lab work in: TODAY (BMET).   Testing/Procedures: none  Follow-Up: Your physician wants you to follow-up in: 6 MONTHS WITH DR END. You will receive a reminder letter in the mail two months in advance. If you don't receive a letter, please call our office to schedule the follow-up appointment.  If you need a refill on your cardiac medications before your next appointment, please call your pharmacy.   

## 2017-08-19 NOTE — Telephone Encounter (Signed)
Patient saw R. Dunn this morning Patient was told she will need to see a surgeon to have a cyst removed Would like to know the name of the surgeon suggested Please call to advise

## 2017-08-19 NOTE — Telephone Encounter (Signed)
Dr. Luan Moore office was the office that had contacted [place] in 01/2017 when her chart was initially reviewed by Dr. Okey Dupre and she was advised to hold Eliquis prior to scheduled procedure.  It is unclear if she is already established with Dr. Luan Moore practice.

## 2017-08-19 NOTE — Telephone Encounter (Signed)
Routing to [employer] for advice.

## 2017-08-19 NOTE — Telephone Encounter (Signed)
S/w patient. Gave her the name of Evette Cristal and his phone number to call to see about following up with this. She verbalized understanding.

## 2017-08-20 LAB — BASIC METABOLIC PANEL
BUN / CREAT RATIO: 13 (ref 9–23)
BUN: 8 mg/dL (ref 6–24)
CALCIUM: 9.6 mg/dL (ref 8.7–10.2)
CHLORIDE: 103 mmol/L (ref 96–106)
CO2: 25 mmol/L (ref 20–29)
Creatinine, Ser: 0.63 mg/dL (ref 0.57–1.00)
GFR, EST AFRICAN AMERICAN: 125 mL/min/{1.73_m2} (ref >59)
GFR, EST NON AFRICAN AMERICAN: 109 mL/min/{1.73_m2} (ref >59)
Glucose: 88 mg/dL (ref 65–99)
POTASSIUM: 3.7 mmol/L (ref 3.5–5.2)
Sodium: 142 mmol/L (ref 134–144)

## 2017-08-24 ENCOUNTER — Other Ambulatory Visit: Payer: Self-pay | Admitting: *Deleted

## 2017-08-24 DIAGNOSIS — I480 Paroxysmal atrial fibrillation: Secondary | ICD-10-CM

## 2017-08-24 DIAGNOSIS — E876 Hypokalemia: Secondary | ICD-10-CM

## 2017-08-24 MED ORDER — POTASSIUM CHLORIDE ER 10 MEQ PO TBCR: 10.0000 meq | EXTENDED_RELEASE_TABLET | Freq: Every day | ORAL | 0 refills | Status: DC

## 2017-11-01 ENCOUNTER — Other Ambulatory Visit: Payer: Self-pay

## 2017-11-01 ENCOUNTER — Telehealth: Payer: Self-pay | Admitting: Internal Medicine

## 2017-11-01 MED ORDER — METOPROLOL TARTRATE 25 MG PO TABS: 25.0000 mg | ORAL_TABLET | Freq: Two times a day (BID) | ORAL | 4 refills | Status: DC

## 2017-11-01 NOTE — Telephone Encounter (Signed)
metoprolol tartrate (LOPRESSOR) 25 MG tablet 60 tablet 4 11/01/2017 11/01/2018   Sig - Route: Take 1 tablet (25 mg total) by mouth 2 (two) times daily. - Oral   Sent to pharmacy as: metoprolol tartrate (LOPRESSOR) 25 MG tablet   Notes to Pharmacy: Patient needs to contact office to schedule an appointment [phone] Thanks !   E-Prescribing Status: Transmission to pharmacy in progress (11/01/2017 4:24 PM EDT)   Pharmacy   [facility] [address] - [place], [place] - [place]

## 2017-11-01 NOTE — Telephone Encounter (Signed)
°*  STAT* If patient is at the pharmacy, call can be transferred to refill team.   1. Which medications need to be refilled? (please list name of each medication and dose if known) metoprolol tartrate (LOPRESSOR) 25 MG  2. Which pharmacy/location (including street and city if local pharmacy) is medication to be sent to? [facility] on [address]  3. Do they need a 30 day or 90 day supply?   Patient would like to speak with nurse to clarify she should still be taking this medication  Please call to discuss

## 2017-11-03 NOTE — Telephone Encounter (Signed)
S/w patient. She wanted to make sure she was still to be taking the metoprolol and if so then she needed a refill. Advised patient that she is to take the medication and that a refill has been sent. She is due for f/u in November, refused to schedule an appointment at this time. Patient was supposed to have BMET rechecked in June to check potassium level and did not yet. Patient verbalized [facility] to get lab work.

## 2017-11-05 ENCOUNTER — Ambulatory Visit: Payer: Medicaid Other | Admitting: Family Medicine

## 2017-11-05 NOTE — Progress Notes (Deleted)
Patient: Margaret [name], Female    DOB: [birth year], 47 y.o.   MRN: [id number] Visit Date: 11/05/2017  Today's Provider: Lavon Paganini, MD   I, Martha Clan, CMA, am acting as scribe for Lavon Paganini, MD.  No chief complaint on file.  Subjective:    Establish Care Margaret [name] is a 47 y.o. female who presents today as a new patient to establish care. She feels {DESC; WELL/FAIRLY WELL/POORLY:18703}. She reports exercising ***. She reports she is sleeping {DESC; WELL/FAIRLY WELL/POORLY:18703}.  -----------------------------------------------------------------   Review of Systems  Social History      She  reports that she has been smoking cigarettes.  She has been smoking about 0.25 packs per day. She has quit using smokeless tobacco. Her smokeless tobacco use included snuff. She reports that she drinks alcohol. She reports that she has current or past drug history. Drug: Marijuana.       Social History   Socioeconomic History  . Marital status: Divorced    Spouse name: Not on file  . Number of children: 2  . Years of education: 97  . Highest education level: Not on file  Occupational History  . Occupation: [employer]  Social Needs  . Financial resource strain: Not on file  . Food insecurity:    Worry: Not on file    Inability: Not on file  . Transportation needs:    Medical: Not on file    Non-medical: Not on file  Tobacco Use  . Smoking status: Current Every Day Smoker    Packs/day: 0.25    Types: Cigarettes  . Smokeless tobacco: Former Systems developer    Types: Snuff  . Tobacco comment: trying to quit; going to start using patches;   Substance and Sexual Activity  . Alcohol use: Yes    Alcohol/week: 0.0 oz    Comment: on occasion  . Drug use: Yes    Types: Marijuana    Comment: Previously, but stopped years ago  . Sexual activity: Yes    Partners: Male    Birth control/protection: Condom  Lifestyle  . Physical activity:    Days per  week: Not on file    Minutes per session: Not on file  . Stress: Not on file  Relationships  . Social connections:    Talks on phone: Not on file    Gets together: Not on file    Attends religious service: Not on file    Active member of club or organization: Not on file    Attends meetings of clubs or organizations: Not on file    Relationship status: Not on file  Other Topics Concern  . Not on file  Social History Narrative   Lives with daughter   Caffeine use: daily   Right handed    Past Medical History:  Diagnosis Date  . Asthma    controlled  . CVA (cerebral vascular accident) ([place])   . Hypothyroidism   . PAF (paroxysmal atrial fibrillation) ([place]) 09/2016   a. 30-day event monitor 6/18: predominant rhythm of sinus with isolated PACs and PVCs.  To narrow complex tachycardia episodes concerning for A. Fib +/-atrial flutter or SVT.  No prolonged pauses; b. CHADS2VASc => 3 (stroke x 2, female); c. Eliquis  . PFO (patent foramen ovale)    a. TTE 6/18: EF of 60 to 65%, normal wall motion, grade 1 diastolic dysfunction; b. TEE 6/18: evidence of a very small atrial level right to left shunt by  bubble study without visualization of ASD or PFO     Patient Active Problem List   Diagnosis Date Noted  . Paroxysmal atrial fibrillation ([place]) 10/29/2016  . PFO (patent foramen ovale) 09/23/2016  . Hyperlipidemia 09/17/2016  . IFG (impaired fasting glucose) 09/17/2016  . History of stroke 09/06/2016  . Myopia 12/20/2014  . Abdominal pain 12/20/2014  . Depression 12/20/2014  . Right arm pain 11/21/2014  . Thyroid activity decreased 10/18/2014  . Abnormal CBC 10/18/2014  . Tobacco abuse 10/18/2014    Past Surgical History:  Procedure Laterality Date  . FRACTURE SURGERY    . TEE WITHOUT CARDIOVERSION N/A 09/08/2016   Procedure: TRANSESOPHAGEAL ECHOCARDIOGRAM (TEE);  Surgeon: Nelva Bush, MD;  Location: [facility] ORS;  Service: Cardiovascular;  Laterality: N/A;    Family History          Family Status  Relation Name Status  . Mother  Alive  . Father  Deceased        Her family history includes Stroke in her father.      No Known Allergies   Current Outpatient Medications:  .  apixaban (ELIQUIS) 5 MG TABS tablet, Take 1 tablet (5 mg total) by mouth 2 (two) times daily., Disp: 60 tablet, Rfl: 3 .  atorvastatin (LIPITOR) 40 MG tablet, Take 1 tablet (40 mg total) by mouth daily at 6 PM., Disp: 30 tablet, Rfl: 6 .  buPROPion (WELLBUTRIN SR) 150 MG 12 hr tablet, 1 pill qAM for 1 week, then 1 pill 2x a day after that, Disp: 60 tablet, Rfl: 3 .  levothyroxine (SYNTHROID, LEVOTHROID) 100 MCG tablet, Take 1 tablet (100 mcg total) by mouth daily., Disp: 90 tablet, Rfl: 3 .  metoprolol tartrate (LOPRESSOR) 25 MG tablet, Take 1 tablet (25 mg total) by mouth 2 (two) times daily., Disp: 60 tablet, Rfl: 4 .  omeprazole (PRILOSEC) 40 MG capsule, Take 1 capsule (40 mg total) by mouth daily. Please contact primary care physician for further refills., Disp: 30 capsule, Rfl: 1 .  polyethylene glycol (MIRALAX / GLYCOLAX) packet, Take 17 g by mouth daily. Mix one tablespoon with 8oz of your favorite juice or water every day until you are having soft formed stools. Then start taking once daily if you didn't have a stool the day before., Disp: 30 each, Rfl: 0 .  potassium chloride (K-DUR) 10 MEQ tablet, Take 1 tablet (10 mEq total) by mouth daily., Disp: 3 tablet, Rfl: 0   Patient Care Team: Center, [facility] as PCP - General ([place]) Christene Lye, MD (General Surgery)      Objective:   Vitals: There were no vitals taken for this visit.  There were no vitals filed for this visit.   Physical Exam   Depression Screen PHQ 2/9 Scores 10/29/2016 12/20/2014  PHQ - 2 Score 4 -  PHQ- 9 Score 19 -  Exception Documentation - Patient refusal      Assessment & Plan:     Routine Health Maintenance and Physical Exam  Exercise Activities and  Dietary recommendations Goals    None      Immunization History  Administered Date(s) Administered  . DTaP 06/21/1972, 10/04/1972, 06/23/1974, 08/23/1975, 12/01/1976  . IPV 06/21/1972, 10/04/1972, 06/23/1974, 08/23/1975  . Influenza,inj,Quad PF,6+ Mos 01/05/2017  . Influenza-Unspecified 01/21/2015, 03/09/2016  . MMR 06/23/1974, 02/20/1988  . Pneumococcal Polysaccharide-23 04/11/2015  . Td 05/01/1987, 02/07/1998, 09/11/2011  . Tdap 12/20/2014    Health Maintenance  Topic Date Due  . PAP SMEAR  09/05/2014  . INFLUENZA VACCINE  11/04/2017  . TETANUS/TDAP  12/19/2024  . HIV Screening  Completed     Discussed health benefits of physical activity, and encouraged her to engage in regular exercise appropriate for her age and condition.    --------------------------------------------------------------------

## 2017-11-15 ENCOUNTER — Other Ambulatory Visit: Payer: Self-pay | Admitting: Physician Assistant

## 2017-11-15 LAB — HM HIV SCREENING LAB: HM HIV Screening: NEGATIVE

## 2017-11-16 ENCOUNTER — Other Ambulatory Visit: Payer: Self-pay | Admitting: Cardiovascular Disease

## 2017-11-16 ENCOUNTER — Other Ambulatory Visit: Payer: Self-pay | Admitting: Family Medicine

## 2018-01-01 ENCOUNTER — Emergency Department: Payer: Medicaid Other

## 2018-01-01 ENCOUNTER — Emergency Department
Admission: EM | Admit: 2018-01-01 | Discharge: 2018-01-01 | Disposition: A | Discharge status: Home / Self Care | Payer: Medicaid Other | Attending: Emergency Medicine | Admitting: Emergency Medicine

## 2018-01-01 ENCOUNTER — Encounter: Payer: Self-pay | Admitting: Emergency Medicine

## 2018-01-01 ENCOUNTER — Other Ambulatory Visit: Payer: Self-pay

## 2018-01-01 DIAGNOSIS — M542 Cervicalgia: Secondary | ICD-10-CM | POA: Insufficient documentation

## 2018-01-01 DIAGNOSIS — Z7901 Long term (current) use of anticoagulants: Secondary | ICD-10-CM | POA: Insufficient documentation

## 2018-01-01 DIAGNOSIS — F17210 Nicotine dependence, cigarettes, uncomplicated: Secondary | ICD-10-CM | POA: Diagnosis not present

## 2018-01-01 DIAGNOSIS — Y939 Activity, unspecified: Secondary | ICD-10-CM | POA: Insufficient documentation

## 2018-01-01 DIAGNOSIS — J45909 Unspecified asthma, uncomplicated: Secondary | ICD-10-CM | POA: Insufficient documentation

## 2018-01-01 DIAGNOSIS — E039 Hypothyroidism, unspecified: Secondary | ICD-10-CM | POA: Diagnosis not present

## 2018-01-01 DIAGNOSIS — M62838 Other muscle spasm: Secondary | ICD-10-CM | POA: Diagnosis not present

## 2018-01-01 DIAGNOSIS — Y929 Unspecified place or not applicable: Secondary | ICD-10-CM | POA: Insufficient documentation

## 2018-01-01 DIAGNOSIS — R51 Headache: Secondary | ICD-10-CM | POA: Diagnosis not present

## 2018-01-01 DIAGNOSIS — I480 Paroxysmal atrial fibrillation: Secondary | ICD-10-CM | POA: Insufficient documentation

## 2018-01-01 DIAGNOSIS — X58XXXA Exposure to other specified factors, initial encounter: Secondary | ICD-10-CM | POA: Insufficient documentation

## 2018-01-01 DIAGNOSIS — S46912A Strain of unspecified muscle, fascia and tendon at shoulder and upper arm level, left arm, initial encounter: Secondary | ICD-10-CM | POA: Insufficient documentation

## 2018-01-01 DIAGNOSIS — Y999 Unspecified external cause status: Secondary | ICD-10-CM | POA: Diagnosis not present

## 2018-01-01 DIAGNOSIS — Z79899 Other long term (current) drug therapy: Secondary | ICD-10-CM | POA: Insufficient documentation

## 2018-01-01 DIAGNOSIS — S4992XA Unspecified injury of left shoulder and upper arm, initial encounter: Secondary | ICD-10-CM | POA: Diagnosis present

## 2018-01-01 DIAGNOSIS — M5412 Radiculopathy, cervical region: Secondary | ICD-10-CM

## 2018-01-01 LAB — BASIC METABOLIC PANEL
ANION GAP: 8 (ref 5–15)
BUN: 8 mg/dL (ref 6–20)
CALCIUM: 8.8 mg/dL — AB (ref 8.9–10.3)
CHLORIDE: 105 mmol/L (ref 98–111)
CO2: 28 mmol/L (ref 22–32)
Creatinine, Ser: 0.81 mg/dL (ref 0.44–1.00)
GFR calc Af Amer: >60 mL/min (ref >60)
GFR calc non Af Amer: >60 mL/min (ref >60)
GLUCOSE: 124 mg/dL — AB (ref 70–99)
POTASSIUM: 3 mmol/L — AB (ref 3.5–5.1)
Sodium: 141 mmol/L (ref 135–145)

## 2018-01-01 LAB — URINALYSIS, COMPLETE (UACMP) WITH MICROSCOPIC
Bilirubin Urine: NEGATIVE
Glucose, UA: NEGATIVE mg/dL
HGB URINE DIPSTICK: NEGATIVE
KETONES UR: NEGATIVE mg/dL
Leukocytes, UA: NEGATIVE
Nitrite: NEGATIVE
PH: 5 (ref 5.0–8.0)
Protein, ur: NEGATIVE mg/dL
Specific Gravity, Urine: 1.028 (ref 1.005–1.030)

## 2018-01-01 LAB — CBC
HEMATOCRIT: 36.6 % (ref 35.0–47.0)
HEMOGLOBIN: 12.4 g/dL (ref 12.0–16.0)
MCH: 28.5 pg (ref 26.0–34.0)
MCHC: 33.9 g/dL (ref 32.0–36.0)
MCV: 84.1 fL (ref 80.0–100.0)
Platelets: 322 10*3/uL (ref 150–440)
RBC: 4.35 MIL/uL (ref 3.80–5.20)
RDW: 14.3 % (ref 11.5–14.5)
WBC: 12.5 10*3/uL — ABNORMAL HIGH (ref 3.6–11.0)

## 2018-01-01 LAB — POCT PREGNANCY, URINE: Preg Test, Ur: NEGATIVE

## 2018-01-01 MED ORDER — METHOCARBAMOL 500 MG PO TABS: 500.0000 mg | ORAL_TABLET | Freq: Three times a day (TID) | ORAL | 0 refills | Status: DC

## 2018-01-01 MED ORDER — KETOROLAC TROMETHAMINE 30 MG/ML IJ SOLN
30.0000 mg | Freq: Once | INTRAMUSCULAR | Status: AC
  Administered 2018-01-01: 30 mg via INTRAMUSCULAR
  Filled 2018-01-01: qty 1

## 2018-01-01 MED ORDER — NAPROXEN 500 MG PO TABS: 500.0000 mg | ORAL_TABLET | Freq: Two times a day (BID) | ORAL | 2 refills | Status: DC

## 2018-01-01 NOTE — ED Provider Notes (Signed)
[facility] Emergency Department Provider Note   ____________________________________________    I have reviewed the triage vital signs and the nursing notes.   HISTORY  Chief Complaint Shoulder Pain and Headache     HPI Margaret [name] is a 47 y.o. female who presents with complaints of left-sided neck pain with some tingling in her left thumb.  She reports symptoms have been ongoing x1 week.  Occasionally she has a headache primarily on the left side of her head.  No muscle weakness.  No dizziness chest pain or shortness of breath.  She is concerned because when she had a stroke she had tingling in her entire left arm, she does not have that now.  She has not taken anything for this.   Past Medical History:  Diagnosis Date  . Asthma    controlled  . CVA (cerebral vascular accident) (HCC)   . Hypothyroidism   . PAF (paroxysmal atrial fibrillation) (HCC) 09/2016   a. 30-day event monitor 6/18: predominant rhythm of sinus with isolated PACs and PVCs.  To narrow complex tachycardia episodes concerning for A. Fib +/-atrial flutter or SVT.  No prolonged pauses; b. CHADS2VASc => 3 (stroke x 2, female); c. Eliquis  . PFO (patent foramen ovale)    a. TTE 6/18: EF of 60 to 65%, normal wall motion, grade 1 diastolic dysfunction; b. TEE 6/18: evidence of a very small atrial level right to left shunt by bubble study without visualization of ASD or PFO    Patient Active Problem List   Diagnosis Date Noted  . Paroxysmal atrial fibrillation (HCC) 10/29/2016  . PFO (patent foramen ovale) 09/23/2016  . Hyperlipidemia 09/17/2016  . IFG (impaired fasting glucose) 09/17/2016  . History of stroke 09/06/2016  . Myopia 12/20/2014  . Abdominal pain 12/20/2014  . Depression 12/20/2014  . Right arm pain 11/21/2014  . Thyroid activity decreased 10/18/2014  . Abnormal CBC 10/18/2014  . Tobacco abuse 10/18/2014    Past Surgical History:  Procedure Laterality  Date  . FRACTURE SURGERY    . TEE WITHOUT CARDIOVERSION N/A 09/08/2016   Procedure: TRANSESOPHAGEAL ECHOCARDIOGRAM (TEE);  Surgeon: [name], MD;  Location: [facility] ORS;  Service: Cardiovascular;  Laterality: N/A;    Prior to Admission medications   Medication Sig Start Date End Date Taking? Authorizing Provider  apixaban (ELIQUIS) 5 MG TABS tablet Take 1 tablet (5 mg total) by mouth 2 (two) times daily. 08/02/17   Antonieta Iba, MD  atorvastatin (LIPITOR) 40 MG tablet take 1 tablet by mouth once daily AT 6PM 11/17/17   End, Cristal Deer, MD  buPROPion [facility] SR) 150 MG 12 hr tablet 1 pill qAM for 1 week, then 1 pill 2x a day after that 10/29/16   Olevia Perches P, DO  levothyroxine (SYNTHROID, LEVOTHROID) 100 MCG tablet Take 1 tablet (100 mcg total) by mouth daily. 01/06/17   Particia Nearing, PA-C  methocarbamol (ROBAXIN) 500 MG tablet Take 1 tablet (500 mg total) by mouth 3 (three) times daily. 01/01/18   Jene Every, MD  metoprolol tartrate (LOPRESSOR) 25 MG tablet Take 1 tablet (25 mg total) by mouth 2 (two) times daily. 11/01/17 11/01/18  Sondra Barges, PA-C  naproxen (NAPROSYN) 500 MG tablet Take 1 tablet (500 mg total) by mouth 2 (two) times daily with a meal. 01/01/18   Jene Every, MD  omeprazole (PRILOSEC) 40 MG capsule take 1 capsule by mouth once daily .Marland KitchenMarland KitchenPLEASE CONTACT PRIMARY CARE PHYSICIAN FOR MORE REFILLS 11/16/17  Sondra Barges, PA-C  polyethylene glycol [facility] / GLYCOLAX) packet Take 17 g by mouth daily. Mix one tablespoon with 8oz of your favorite juice or water every day until you are having soft formed stools. Then start taking once daily if you didn't have a stool the day before. 03/21/17   Willy Eddy, MD  potassium chloride (K-DUR) 10 MEQ tablet Take 1 tablet (10 mEq total) by mouth daily. 08/24/17 11/22/17  Sondra Barges, PA-C     Allergies Patient has no known allergies.  Family History  Problem Relation Age of Onset  . Stroke Father     Social  History Social History   Tobacco Use  . Smoking status: Current Every Day Smoker    Packs/day: 0.25    Types: Cigarettes  . Smokeless tobacco: Former Neurosurgeon    Types: Snuff  . Tobacco comment: trying to quit; going to start using patches;   Substance Use Topics  . Alcohol use: Yes    Alcohol/week: 0.0 standard drinks    Comment: on occasion  . Drug use: Yes    Types: Marijuana    Comment: Previously, but stopped years ago    Review of Systems  Constitutional: No fever/chills Eyes: No visual changes.  ENT: As above Cardiovascular: Denies chest pain. Respiratory: Denies shortness of breath. Gastrointestinal: No abdominal pain.  No nausea, no vomiting.   Genitourinary: Negative for dysuria. Musculoskeletal: As above. Skin: Negative for rash. Neurological: As above   ____________________________________________   PHYSICAL EXAM:  VITAL SIGNS: ED Triage Vitals  Enc Vitals Group     BP 01/01/18 2137 119/89     Pulse Rate 01/01/18 2137 92     Resp 01/01/18 2137 18     Temp 01/01/18 2137 98.4 F (36.9 C)     Temp Source 01/01/18 2137 Oral     SpO2 01/01/18 2137 99 %     Weight 01/01/18 2138 97.5 kg (215 lb)     Height 01/01/18 2138 1.651 m (5\' 5" )     Head Circumference --      Peak Flow --      Pain Score 01/01/18 2137 6     Pain Loc --      Pain Edu? --      Excl. in GC? --     Constitutional: Alert and oriented. Eyes: Conjunctivae are normal.  PERRLA  Nose: No congestion/rhinnorhea. Mouth/Throat: Mucous membranes are moist.   Neck: No vertebral tenderness to palpation, patient has significant muscle spasm to the left mid trapezius with tenderness to palpation Cardiovascular: Normal rate, regular rhythm. Grossly normal heart sounds.  Good peripheral circulation. Respiratory: Normal respiratory effort.  No retractions. Gastrointestinal: Soft and nontender. No distention.   Musculoskeletal: Warm and well perfused Neurologic:  Normal speech and language. No  gross focal neurologic deficits are appreciated.  Cranial nerves II through XII are normal Skin:  Skin is warm, dry and intact. No rash noted. Psychiatric: Mood and affect are normal. Speech and behavior are normal.  ____________________________________________   LABS (all labs ordered are listed, but only abnormal results are displayed)  Labs Reviewed  BASIC METABOLIC PANEL - Abnormal; Notable for the following components:      Result Value   Potassium 3.0 (*)    Glucose, Bld 124 (*)    Calcium 8.8 (*)    All other components within normal limits  CBC - Abnormal; Notable for the following components:   WBC 12.5 (*)    All other components within  normal limits  URINALYSIS, COMPLETE (UACMP) WITH MICROSCOPIC - Abnormal; Notable for the following components:   Color, Urine YELLOW (*)    APPearance HAZY (*)    Bacteria, UA RARE (*)    All other components within normal limits  POC URINE PREG, ED  POCT PREGNANCY, URINE  CBG MONITORING, ED   ____________________________________________  EKG  ED ECG REPORT I, Jene Every, the attending physician, personally viewed and interpreted this ECG.  Date: 01/01/2018  Rhythm: normal sinus rhythm QRS Axis: normal Intervals: Prolonged QTC ST/T Wave abnormalities: normal Narrative Interpretation: no evidence of acute ischemia  ____________________________________________  RADIOLOGY  CT head normal ____________________________________________   PROCEDURES  Procedure(s) performed: No  Procedures   Critical Care performed: No ____________________________________________   INITIAL IMPRESSION / ASSESSMENT AND PLAN / ED COURSE  Pertinent labs & imaging results that were available during my care of the patient were reviewed by me and considered in my medical decision making (see chart for details).  Patient well-appearing in no acute distress.  Symptoms do not appear to be consistent with CVA.  I suspect radiculopathy  secondary to muscle spasm versus possible cervical source.  No weakness.  Symptoms ongoing for 1 week.  Will treat with IM Toradol, pending CT scan, if normal will discharge home with muscle relaxers, NSAIDs    ____________________________________________   FINAL CLINICAL IMPRESSION(S) / ED DIAGNOSES  Final diagnoses:  Strain of left shoulder, initial encounter  Muscle spasm        Note:  This document was prepared using Dragon voice recognition software and may include unintentional dictation errors.    Jene Every, MD 01/01/18 [phone]

## 2018-01-01 NOTE — ED Notes (Signed)
Patient transported to CT 

## 2018-01-01 NOTE — ED Triage Notes (Signed)
Pt ambulatory to triage with no  Difficulty. Pt reports pain to her left shoulder for about 1 week. Reports pain radiates down her arm to her hand. Pt also reports headache for over a month. Pt reports these symptoms are the same as when she had a stroke approximately a year and a half go. Pt talking in full and complete sentences at this time. No noted neuro deficits at this time.

## 2018-01-12 ENCOUNTER — Other Ambulatory Visit: Payer: Self-pay | Admitting: Family Medicine

## 2018-01-12 NOTE — Telephone Encounter (Signed)
erroneous entry 

## 2018-01-12 NOTE — Telephone Encounter (Signed)
Requested medication (s) are due for refill today: Yes  Requested medication (s) are on the active medication list: Yes  Last refill:  01/06/17  Future visit scheduled: Yes - with another practice.  Notes to clinic:  Pt. Is going to [facility] to see Ms. Pollak.    Requested Prescriptions  Pending Prescriptions Disp Refills   levothyroxine (SYNTHROID, LEVOTHROID) 100 MCG tablet [Pharmacy Med Name: LEVOTHYROXINE 0.100MG  ( ) TAB] 90 tablet 0    Sig: TAKE 1 TABLET BY MOUTH EVERY DAY     Endocrinology:  Hypothyroid Agents Failed - 01/12/2018  2:28 PM      Failed - TSH needs to be rechecked within 3 months after an abnormal result. Refill until TSH is due.      Failed - TSH in normal range and within 360 days    TSH  Date Value Ref Range Status  01/05/2017 0.084 (L) 0.450 - 4.500 uIU/mL Final         Failed - Valid encounter within last 12 months    Recent Outpatient Visits          1 year ago Hypothyroidism, unspecified type   [facility] Particia Nearing, [place]   1 year ago Hypothyroidism, unspecified type   [facility] [place], [place], DO   1 year ago History of stroke   [facility] [place], Megan P, DO   1 year ago Acute suppurative otitis media of right ear without spontaneous rupture of tympanic membrane, recurrence not specified   [facility] [place], Megan P, DO   1 year ago Onychomycosis of toenail   [facility] [place], Oralia Rud, DO      Future Appointments            In 1 month Maryella Shivers [facility]

## 2018-01-12 NOTE — Telephone Encounter (Signed)
Patient has transferred care to Osvaldo Angst, PA [facility] family.Marland Kitchen Has an appointment in November

## 2018-01-15 ENCOUNTER — Other Ambulatory Visit: Payer: Self-pay | Admitting: Family Medicine

## 2018-01-17 NOTE — Telephone Encounter (Signed)
Patient called, left VM to return call to the office to schedule with another provider due to Osvaldo Angst no longer with practice.

## 2018-01-17 NOTE — Telephone Encounter (Signed)
This pt has been dismissed from this practice.

## 2018-01-17 NOTE — Telephone Encounter (Signed)
Requested medication (s) are due for refill today: Yes  Requested medication (s) are on the active medication list: Yes  Last refill:  01/2017  Future visit scheduled: Yes, needs another provider for CPE  Notes to clinic:  Unable to refill per protocol, expired Rx, patient needs OV     Requested Prescriptions  Pending Prescriptions Disp Refills   levothyroxine (SYNTHROID, LEVOTHROID) 100 MCG tablet [Pharmacy Med Name: LEVOTHYROXINE 0.100MG  ( ) TAB] 90 tablet 0    Sig: TAKE 1 TABLET BY MOUTH EVERY DAY     Endocrinology:  Hypothyroid Agents Failed - 01/17/2018  2:13 PM      Failed - TSH needs to be rechecked within 3 months after an abnormal result. Refill until TSH is due.      Failed - TSH in normal range and within 360 days    TSH  Date Value Ref Range Status  01/05/2017 0.084 (L) 0.450 - 4.500 uIU/mL Final         Failed - Valid encounter within last 12 months    Recent Outpatient Visits          1 year ago Hypothyroidism, unspecified type   [facility] Particia Nearing, [place]   1 year ago Hypothyroidism, unspecified type   [facility] [place], [place], DO   1 year ago History of stroke   [facility] [place], Megan P, DO   1 year ago Acute suppurative otitis media of right ear without spontaneous rupture of tympanic membrane, recurrence not specified   [facility] [place], Megan P, DO   1 year ago Onychomycosis of toenail   [facility] [place], Oralia Rud, DO      Future Appointments            In 1 month Maryella Shivers [facility]

## 2018-01-25 ENCOUNTER — Ambulatory Visit: Payer: Medicaid Other | Admitting: Nurse Practitioner

## 2018-01-25 ENCOUNTER — Encounter

## 2018-01-26 ENCOUNTER — Other Ambulatory Visit: Payer: Self-pay

## 2018-01-26 ENCOUNTER — Ambulatory Visit: Payer: Medicaid Other | Admitting: Internal Medicine

## 2018-01-28 ENCOUNTER — Ambulatory Visit: Payer: Medicaid Other | Admitting: Internal Medicine

## 2018-01-28 NOTE — Progress Notes (Deleted)
Follow-up Outpatient Visit Date: 01/28/2018  Primary Care Provider: [facility] [address] [place] [address]  Chief Complaint: ***  HPI:  Ms. Blecher is a 47 y.o. year-old female with history of paroxysmal atrial fibrillation, small PFO, stroke, hypothyroidism, asthma, and polysubstance abuse, who presents for follow-up of atrial fibrillation and PFO.  She was last seen by Eula Listen, PA, in May.  At that time, she was doing well other than continued intermittent difficulty with word finding that had been present ever since her stroke.  No medication changes were made at that time.  --------------------------------------------------------------------------------------------------  Cardiovascular History & Procedures: Cardiovascular Problems:  Stroke  Paroxysmal atrial fibrillation  Small PFO  Risk Factors:  Stroke  Cath/PCI:  None  CV Surgery:  None  EP Procedures and Devices:  30-day event monitor (09/16/16): Predominantly sinus rhythm with isolated PACs and PVCs. 2 narrow complex tachycardia episodes concerning for atrial fibrillation +/- atrial flutter or SVT. No prolonged pauses.  Non-Invasive Evaluation(s):  Transcranial Doppler (10/05/16): Small right-to-left shunt with Valsalva.  TEE (09/08/16): Normal LV size and wall thickness. Normal systolic function. Trivial aortic regurgitation. Mild MR. No left atrial thrombus. No right atrial thrombus. Atrial septum with question of small right to left shunt, with appearance of small number of bubbles in the left ventricle after injection of agitated saline.  TTE (09/07/16): Normal LV size and wall thickness. LVEF 60-65% with normal wall motion. Grade 1 diastolic dysfunction. Normal RV size and function. No significant valvular abnormalities.  Recent CV Pertinent Labs: Lab Results  Component Value Date   CHOL 99 (L) 11/12/2016   CHOL 140 09/04/2013   HDL 37 (L) 11/12/2016    HDL 30 (L) 09/04/2013   LDLCALC 33 11/12/2016   LDLCALC 83 09/04/2013   TRIG 143 11/12/2016   TRIG 137 09/04/2013   CHOLHDL 5.1 09/07/2016   INR 1.13 09/06/2016   INR 1.2 09/04/2013   K 3.0 (L) 01/01/2018   K 3.8 09/04/2013   MG 1.8 09/02/2013   BUN 8 01/01/2018   BUN 8 08/19/2017   BUN 11 09/04/2013   CREATININE 0.81 01/01/2018   CREATININE 0.81 09/04/2013    Past medical and surgical history were reviewed and updated in EPIC.  No outpatient medications have been marked as taking for the 01/28/18 encounter (Appointment) with Shivaan Tierno, Cristal Deer, MD.    Allergies: Patient has no known allergies.  Social History   Tobacco Use  . Smoking status: Current Every Day Smoker    Packs/day: 0.25    Types: Cigarettes  . Smokeless tobacco: Former Neurosurgeon    Types: Snuff  . Tobacco comment: trying to quit; going to start using patches;   Substance Use Topics  . Alcohol use: Yes    Alcohol/week: 0.0 standard drinks    Comment: on occasion  . Drug use: Yes    Types: Marijuana    Comment: Previously, but stopped years ago    Family History  Problem Relation Age of Onset  . Stroke Father     Review of Systems: A 12-system review of systems was performed and was negative except as noted in the HPI.  --------------------------------------------------------------------------------------------------  Physical Exam: There were no vitals taken for this visit.  General:  *** HEENT: No conjunctival pallor or scleral icterus. Moist mucous membranes.  OP clear. Neck: Supple without lymphadenopathy, thyromegaly, JVD, or HJR. No carotid bruit. Lungs: Normal work of breathing. Clear to auscultation bilaterally without wheezes or crackles. Heart: Regular rate  and rhythm without murmurs, rubs, or gallops. Non-displaced PMI. Abd: Bowel sounds present. Soft, NT/ND without hepatosplenomegaly Ext: No lower extremity edema. Radial, PT, and DP pulses are 2+ bilaterally. Skin: Warm and dry  without rash.  EKG:  ***  Lab Results  Component Value Date   WBC 12.5 (H) 01/01/2018   HGB 12.4 01/01/2018   HCT 36.6 01/01/2018   MCV 84.1 01/01/2018   PLT 322 01/01/2018    Lab Results  Component Value Date   NA 141 01/01/2018   K 3.0 (L) 01/01/2018   CL 105 01/01/2018   CO2 28 01/01/2018   BUN 8 01/01/2018   CREATININE 0.81 01/01/2018   GLUCOSE 124 (H) 01/01/2018   ALT 20 03/21/2017    Lab Results  Component Value Date   CHOL 99 (L) 11/12/2016   HDL 37 (L) 11/12/2016   LDLCALC 33 11/12/2016   TRIG 143 11/12/2016   CHOLHDL 5.1 09/07/2016    --------------------------------------------------------------------------------------------------  ASSESSMENT AND PLAN: Cristal Deer Jakory Matsuo, MD 01/28/2018 7:21 AM

## 2018-02-10 ENCOUNTER — Ambulatory Visit: Payer: Medicaid Other | Admitting: Podiatry

## 2018-02-14 ENCOUNTER — Ambulatory Visit: Payer: Medicaid Other | Admitting: Podiatry

## 2018-02-24 ENCOUNTER — Other Ambulatory Visit: Payer: Self-pay | Admitting: Family Medicine

## 2018-02-24 ENCOUNTER — Other Ambulatory Visit: Payer: Self-pay | Admitting: Cardiovascular Disease

## 2018-02-24 ENCOUNTER — Encounter: Payer: Self-pay | Admitting: Physician Assistant

## 2018-02-24 ENCOUNTER — Ambulatory Visit (INDEPENDENT_AMBULATORY_CARE_PROVIDER_SITE_OTHER): Payer: Medicaid Other | Admitting: Physician Assistant

## 2018-02-24 VITALS — BP 128/76 | HR 84 | Temp 98.4°F | Wt 231.6 lb

## 2018-02-24 DIAGNOSIS — Z8673 Personal history of transient ischemic attack (TIA), and cerebral infarction without residual deficits: Secondary | ICD-10-CM | POA: Diagnosis not present

## 2018-02-24 DIAGNOSIS — F331 Major depressive disorder, recurrent, moderate: Secondary | ICD-10-CM

## 2018-02-24 DIAGNOSIS — F1411 Cocaine abuse, in remission: Secondary | ICD-10-CM

## 2018-02-24 DIAGNOSIS — Z720 Tobacco use: Secondary | ICD-10-CM

## 2018-02-24 DIAGNOSIS — I480 Paroxysmal atrial fibrillation: Secondary | ICD-10-CM

## 2018-02-24 DIAGNOSIS — E782 Mixed hyperlipidemia: Secondary | ICD-10-CM

## 2018-02-24 DIAGNOSIS — E039 Hypothyroidism, unspecified: Secondary | ICD-10-CM

## 2018-02-24 DIAGNOSIS — R140 Abdominal distension (gaseous): Secondary | ICD-10-CM

## 2018-02-24 MED ORDER — SERTRALINE HCL 25 MG PO TABS: 25.0000 mg | ORAL_TABLET | Freq: Every day | ORAL | 0 refills | Status: DC

## 2018-02-24 NOTE — Patient Instructions (Signed)

## 2018-02-24 NOTE — Progress Notes (Signed)
Patient: Margaret [name], Female    DOB: 1971/02/11, 47 y.o.   MRN: [id number] Visit Date: 03/14/2018  Today's Provider: Trinna Post, PA-C   Chief Complaint  Patient presents with  . New Patient (Initial Visit)   Subjective:    New Patient Appointment Margaret [name] is a 47 y.o. female who presents today for health maintenance and new patient appointment. She feels fairly well. She reports exercising none. She reports she is sleeping poorly. She was previously seen at [facility] practice and most recently the [facility]. 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 She has two children ages 33 and 71.  CVA: She had a right frontal lobe infarct 09/2016. TEE showed very small atrial level right to left shunt by bubble study without visualization of ASD or PFO. No significant stenosis on carotid doppler. Coag workup negative. A1c 5.9 at that time. Statin initiated for LDL 133. She was unable to receive tPA at time of admission due to presenting 24 hours after symptom onset. Takes eliquis.  Substance Abuse: She has a history of drug use including cocaine. Says she "laced" it. Reports she hasn't used cocaine in at least two years. Denies injecting cocaine. She currently uses marijuana. She says she uses alcohol. Does not specify how much, says "just put occasionally" when questioned specifically about how much she uses.   Hypothyroidism: She is uncertain what dose she should be on. She reports she is currently on 100 mcg synthroid but isn't sure if this is right. Was followed by endocrinology at some point.   Paroxysmal Atrial Fibrillation: is currently followed by Dr. Saunders Revel, takes metoprolol tartrate 25 mg BID. Takes Eliquis as well.   Depression: Currently takes 25 mg zoloft. Reports worsening depression. Was previously on wellbutrin, she is unsure if this is for depression or smoking cessation. Denies SI/HI.   Depression screen [facility] 2/9  02/24/2018 10/29/2016  Decreased Interest 1 2  Down, Depressed, Hopeless 1 2  PHQ - 2 Score 2 4  Altered sleeping 3 3  Tired, decreased energy 2 3  Change in appetite 2 3  Feeling bad or failure about yourself  1 2  Trouble concentrating 3 2  Moving slowly or fidgety/restless 1 2  Suicidal thoughts 0 0  PHQ-9 Score 14 19  Difficult doing work/chores Very difficult -   HLD: She is currently on lipitor 40 mg daily.   She also reports abdominal bloating and pain.   -----------------------------------------------------------------   Review of Systems  Constitutional: Negative.   HENT: Positive for sneezing and tinnitus.   Eyes: Positive for photophobia.  Respiratory: Negative.   Cardiovascular: Positive for leg swelling.  Gastrointestinal: Positive for abdominal distention and abdominal pain.  Endocrine: Negative.   Genitourinary: Negative.   Musculoskeletal: Positive for neck pain.  Skin: Negative.   Allergic/Immunologic: Negative.   Neurological: Positive for headaches.  Hematological: Negative.   Psychiatric/Behavioral: Positive for sleep disturbance.       Depressed    Social History She  reports that she has been smoking cigarettes. She has been smoking about 0.25 packs per day. She has quit using smokeless tobacco.  Her smokeless tobacco use included snuff. She reports that she drinks alcohol. She reports that she has current or past drug history. Drug: Marijuana. Social History   Socioeconomic History  . Marital status: Divorced    Spouse name: Not on file  . Number of children: 2  .  Years of education: 85  . Highest education level: Not on file  Occupational History  . Occupation: [employer]  Social Needs  . Financial resource strain: Not on file  . Food insecurity:    Worry: Not on file    Inability: Not on file  . Transportation needs:    Medical: Not on file    Non-medical: Not on file  Tobacco Use  . Smoking status: Current Every Day Smoker     Packs/day: 0.25    Types: Cigarettes  . Smokeless tobacco: Former Systems developer    Types: Snuff  . Tobacco comment: trying to quit; going to start using patches;   Substance and Sexual Activity  . Alcohol use: Yes    Alcohol/week: 0.0 standard drinks    Comment: on occasion  . Drug use: Yes    Types: Marijuana    Comment: Previously, but stopped years ago  . Sexual activity: Yes    Partners: Male    Birth control/protection: Condom  Lifestyle  . Physical activity:    Days per week: Not on file    Minutes per session: Not on file  . Stress: Not on file  Relationships  . Social connections:    Talks on phone: Not on file    Gets together: Not on file    Attends religious service: Not on file    Active member of club or organization: Not on file    Attends meetings of clubs or organizations: Not on file    Relationship status: Not on file  Other Topics Concern  . Not on file  Social History Narrative   Lives with daughter   Caffeine use: daily   Right handed    Patient Active Problem List   Diagnosis Date Noted  . Paroxysmal atrial fibrillation ([place]) 10/29/2016  . PFO (patent foramen ovale) 09/23/2016  . Hyperlipidemia 09/17/2016  . IFG (impaired fasting glucose) 09/17/2016  . History of stroke 09/06/2016  . Myopia 12/20/2014  . Abdominal pain 12/20/2014  . Depression 12/20/2014  . Right arm pain 11/21/2014  . Thyroid activity decreased 10/18/2014  . Abnormal CBC 10/18/2014  . Tobacco abuse 10/18/2014    Past Surgical History:  Procedure Laterality Date  . FRACTURE SURGERY    . TEE WITHOUT CARDIOVERSION N/A 09/08/2016   Procedure: TRANSESOPHAGEAL ECHOCARDIOGRAM (TEE);  Surgeon: Nelva Bush, MD;  Location: [facility] ORS;  Service: Cardiovascular;  Laterality: N/A;    Family History  Family Status  Relation Name Status  . Mother  Alive  . Father  Deceased   Her family history includes Stroke in her father.     No Known Allergies  Previous Medications    ATORVASTATIN (LIPITOR) 40 MG TABLET    take 1 tablet by mouth once daily AT 6PM   LEVOTHYROXINE (SYNTHROID, LEVOTHROID) 100 MCG TABLET    Take 1 tablet (100 mcg total) by mouth daily.   OMEPRAZOLE (PRILOSEC) 40 MG CAPSULE    take 1 capsule by mouth once daily .Marland KitchenMarland KitchenPLEASE CONTACT PRIMARY CARE PHYSICIAN FOR MORE REFILLS   POLYETHYLENE GLYCOL (MIRALAX / GLYCOLAX) PACKET    Take 17 g by mouth daily. Mix one tablespoon with 8oz of your favorite juice or water every day until you are having soft formed stools. Then start taking once daily if you didn't have a stool the day before.   POTASSIUM CHLORIDE (K-DUR) 10 MEQ TABLET    Take 1 tablet (10 mEq total) by mouth daily.    Patient Care Team: Center,  [place] as PCP - General ([place]) End, Harrell Gave, MD as PCP - Cardiology (Cardiology) Christene Lye, MD (General Surgery)      Objective:   Vitals: BP 128/76 (BP Location: Left Arm, Patient Position: Sitting, Cuff Size: Normal)   Pulse 84   Temp 98.4 F (36.9 C) (Oral)   Wt 231 lb 9.6 oz (105.1 kg)   SpO2 98%   BMI 38.54 kg/m    Physical Exam  Constitutional: She is oriented to person, place, and time. She appears well-developed and well-nourished.  Lying down on exam table with eyes closed upon entering, does not open them even when I speak to her directly. I have to ask her if she wishes to continue the visit, at which point she does sit up and interact.   Cardiovascular: Normal rate and regular rhythm.  Pulmonary/Chest: Effort normal and breath sounds normal.  Abdominal: Soft. Bowel sounds are normal.  Neurological: She is alert and oriented to person, place, and time.  Skin: Skin is warm and dry.  Psychiatric: She has a normal mood and affect. Her behavior is normal.     Depression Screen PHQ 2/9 Scores 02/24/2018 10/29/2016 12/20/2014  PHQ - 2 Score 2 4 -  PHQ- 9 Score 14 19 -  Exception Documentation - - Patient refusal      Assessment &  Plan:     Routine Health Maintenance and Physical Exam  Exercise Activities and Dietary recommendations Goals   None     Immunization History  Administered Date(s) Administered  . DTaP 06/21/1972, 10/04/1972, 06/23/1974, 08/23/1975, 12/01/1976  . IPV 06/21/1972, 10/04/1972, 06/23/1974, 08/23/1975  . Influenza,inj,Quad PF,6+ Mos 01/05/2017  . Influenza-Unspecified 01/21/2015, 03/09/2016  . MMR 06/23/1974, 02/20/1988  . Pneumococcal Polysaccharide-23 04/11/2015  . Td 05/01/1987, 02/07/1998, 09/11/2011  . Tdap 12/20/2014    Health Maintenance  Topic Date Due  . PAP SMEAR  09/05/2014  . INFLUENZA VACCINE  11/04/2017  . TETANUS/TDAP  12/19/2024  . HIV Screening  Completed     Discussed health benefits of physical activity, and encouraged her to engage in regular exercise appropriate for her age and condition.    1. Paroxysmal atrial fibrillation ([place])  Followed by cardiology, on metoprolol and eliquis.  2. Mixed hyperlipidemia  Continue lipitor 40 mg.   - Lipid Profile  3. History of stroke  Small PFO, lifelong anitcoag.   - CBC with Differential  4. Moderate episode of recurrent major depressive disorder (HCC)  - sertraline (ZOLOFT) 25 MG tablet; Take 1 tablet (25 mg total) by mouth daily.  Dispense: 90 tablet; Refill: 0  5. Tobacco abuse  Counseled on smoking cessation.  6. Hypothyroidism, unspecified type  - Comprehensive Metabolic Panel (CMET) - TYV+D7B+A2VOHC  7. History of cocaine abuse ([place])  Reports she no longer uses.   8. Abdominal bloating  Follow up 1-2 weeks for pelvic and abdominal exam.   Return in about 2 weeks (around 03/10/2018) for abdominal bloating, PAP smear .  The entirety of the information documented in the History of Present Illness, Review of Systems and Physical Exam were personally obtained by me. Portions of this information were initially documented by Lynford Humphrey, CMA and reviewed by me for thoroughness and  accuracy.       --------------------------------------------------------------------

## 2018-02-25 ENCOUNTER — Encounter: Payer: Self-pay | Admitting: Nurse Practitioner

## 2018-02-25 ENCOUNTER — Ambulatory Visit (INDEPENDENT_AMBULATORY_CARE_PROVIDER_SITE_OTHER): Payer: Medicaid Other | Admitting: Nurse Practitioner

## 2018-02-25 VITALS — BP 112/84 | HR 75 | Ht 65.0 in | Wt 228.0 lb

## 2018-02-25 DIAGNOSIS — E782 Mixed hyperlipidemia: Secondary | ICD-10-CM

## 2018-02-25 DIAGNOSIS — I480 Paroxysmal atrial fibrillation: Secondary | ICD-10-CM

## 2018-02-25 DIAGNOSIS — Z720 Tobacco use: Secondary | ICD-10-CM

## 2018-02-25 LAB — LIPID PANEL
Chol/HDL Ratio: 3.6 ratio (ref 0.0–4.4)
Cholesterol, Total: 135 mg/dL (ref 100–199)
HDL: 37 mg/dL — ABNORMAL LOW (ref >39)
LDL Calculated: 66 mg/dL (ref 0–99)
Triglycerides: 161 mg/dL — ABNORMAL HIGH (ref 0–149)
VLDL Cholesterol Cal: 32 mg/dL (ref 5–40)

## 2018-02-25 LAB — CBC WITH DIFFERENTIAL/PLATELET
Basophils Absolute: 0.1 10*3/uL (ref 0.0–0.2)
Basos: 1 %
EOS (ABSOLUTE): 0.1 10*3/uL (ref 0.0–0.4)
Eos: 1 %
Hematocrit: 34.9 % (ref 34.0–46.6)
Hemoglobin: 11.7 g/dL (ref 11.1–15.9)
Immature Grans (Abs): 0 10*3/uL (ref 0.0–0.1)
Immature Granulocytes: 0 %
Lymphocytes Absolute: 4.4 10*3/uL — ABNORMAL HIGH (ref 0.7–3.1)
Lymphs: 43 %
MCH: 28.3 pg (ref 26.6–33.0)
MCHC: 33.5 g/dL (ref 31.5–35.7)
MCV: 85 fL (ref 79–97)
Monocytes Absolute: 0.6 10*3/uL (ref 0.1–0.9)
Monocytes: 6 %
Neutrophils Absolute: 4.9 10*3/uL (ref 1.4–7.0)
Neutrophils: 49 %
Platelets: 358 10*3/uL (ref 150–450)
RBC: 4.13 x10E6/uL (ref 3.77–5.28)
RDW: 13.7 % (ref 12.3–15.4)
WBC: 10.1 10*3/uL (ref 3.4–10.8)

## 2018-02-25 LAB — TSH+T4F+T3FREE
Free T4: 1.13 ng/dL (ref 0.82–1.77)
T3, Free: 2.9 pg/mL (ref 2.0–4.4)
TSH: 1.81 u[IU]/mL (ref 0.450–4.500)

## 2018-02-25 LAB — COMPREHENSIVE METABOLIC PANEL
ALT: 14 IU/L (ref 0–32)
AST: 18 IU/L (ref 0–40)
Albumin/Globulin Ratio: 1.3 (ref 1.2–2.2)
Albumin: 3.9 g/dL (ref 3.5–5.5)
Alkaline Phosphatase: 111 IU/L (ref 39–117)
BUN/Creatinine Ratio: 13 (ref 9–23)
BUN: 10 mg/dL (ref 6–24)
Bilirubin Total: 0.3 mg/dL (ref 0.0–1.2)
CO2: 21 mmol/L (ref 20–29)
Calcium: 9.2 mg/dL (ref 8.7–10.2)
Chloride: 105 mmol/L (ref 96–106)
Creatinine, Ser: 0.75 mg/dL (ref 0.57–1.00)
GFR calc Af Amer: 111 mL/min/{1.73_m2} (ref >59)
GFR calc non Af Amer: 96 mL/min/{1.73_m2} (ref >59)
Globulin, Total: 2.9 g/dL (ref 1.5–4.5)
Glucose: 90 mg/dL (ref 65–99)
Potassium: 4 mmol/L (ref 3.5–5.2)
Sodium: 143 mmol/L (ref 134–144)
Total Protein: 6.8 g/dL (ref 6.0–8.5)

## 2018-02-25 MED ORDER — METOPROLOL TARTRATE 25 MG PO TABS: 25.0000 mg | ORAL_TABLET | Freq: Two times a day (BID) | ORAL | 3 refills | Status: DC

## 2018-02-25 MED ORDER — APIXABAN 5 MG PO TABS: 5.0000 mg | ORAL_TABLET | Freq: Two times a day (BID) | ORAL | 3 refills | Status: DC

## 2018-02-25 NOTE — Progress Notes (Signed)
Office Visit    Patient Name: Margaret [name] Date of Encounter: 02/25/2018  Primary Care Provider:  [facility], [facility] Primary Cardiologist:  [name], MD  Chief Complaint    47 year old female with a history of stroke, paroxysmal atrial fibrillation on Eliquis, patent foramen ovale, hypothyroidism, asthma, and remote history of polysubstance abuse with ongoing tobacco abuse, who presents for follow-up of atrial fibrillation.  Past Medical History    Past Medical History:  Diagnosis Date  . Anxiety   . Asthma    controlled  . CVA (cerebral vascular accident) (HCC)    a. 09/2016  . Depression   . Heart murmur   . Hypothyroidism   . PAF (paroxysmal atrial fibrillation) (HCC) 09/2016   a. 30-day event monitor 6/18: predominant rhythm of sinus with isolated PACs and PVCs.  Two narrow complex tachycardia episodes concerning for A. Fib +/-atrial flutter or SVT.  No prolonged pauses; b. CHADS2VASc = 3 (stroke x 2, female)--> Eliquis.  Marland Kitchen PFO (patent foramen ovale)    a. TTE 6/18: EF of 60 to 65%, normal wall motion, grade 1 diastolic dysfunction; b. TEE 6/18: evidence of a very small atrial level right to left shunt by bubble study without visualization of ASD or PFO  . Substance abuse (HCC)    Past use of cocaine   . Tobacco abuse   . Tuberculosis    Past Surgical History:  Procedure Laterality Date  . FRACTURE SURGERY    . TEE WITHOUT CARDIOVERSION N/A 09/08/2016   Procedure: TRANSESOPHAGEAL ECHOCARDIOGRAM (TEE);  Surgeon: [name], MD;  Location: [facility] ORS;  Service: Cardiovascular;  Laterality: N/A;    Allergies  No Known Allergies  History of Present Illness    47 year old female with the above complex past medical history with hospitalization in June 2018 in the setting of slurred speech and right frontal lobe stroke.  Echocardiogram showed normal LV function with grade 1 diastolic dysfunction while carotid ultrasound showed less  than 50% bilateral internal carotid artery stenoses.  Transesophageal echocardiogram was undertaken and showed a very small atrial level right to left shunt consistent with PFO.  This was followed by 30-day event monitoring which showed 2 episodes of narrow complex tachycardia concerning for A. fib/flutter.  In that setting, she was placed on Xarelto and beta-blocker therapy and was later switched from Xarelto to Eliquis.  Other history includes hypothyroidism, asthma, obesity, remote cocaine abuse, and ongoing tobacco abuse with occasional marijuana usage.  She was last seen in clinic in May of this year.  Since then, she has done reasonably well from a cardiac standpoint.  She has some degree of chronic dyspnea on exertion though admits to being very sedentary and has gained a fair amount of weight over the years.  She works as a Lawyer a few days a week and says that that causes her to feel down.  As result, she took a part-time job at [employer] just so she can interact with more people during her workday.  She has continued to struggle with some depression and establish with a new primary care provider just yesterday and was placed on Zoloft.  She has not had any chest pain, palpitations, PND, orthopnea, dizziness, syncope, or early satiety.  She sometimes notes mild ankle edema when she is on her feet all day.  Home Medications    Prior to Admission medications   Medication Sig Start Date End Date Taking? Authorizing Provider  apixaban (ELIQUIS) 5 MG TABS  tablet Take 1 tablet (5 mg total) by mouth 2 (two) times daily. 08/02/17  Yes Antonieta Iba, MD  atorvastatin (LIPITOR) 40 MG tablet take 1 tablet by mouth once daily AT 6PM 11/17/17  Yes End, Cristal Deer, MD  levothyroxine (SYNTHROID, LEVOTHROID) 100 MCG tablet Take 1 tablet (100 mcg total) by mouth daily. 01/06/17  Yes Particia Nearing, PA-C  metoprolol tartrate (LOPRESSOR) 25 MG tablet Take 1 tablet (25 mg total) by mouth 2 (two) times daily.  11/01/17 11/01/18 Yes Dunn, Raymon Mutton, PA-C  omeprazole (PRILOSEC) 40 MG capsule take 1 capsule by mouth once daily .Marland KitchenMarland KitchenPLEASE CONTACT PRIMARY CARE PHYSICIAN FOR MORE REFILLS 11/16/17  Yes Dunn, Raymon Mutton, PA-C  polyethylene glycol [facility] / GLYCOLAX) packet Take 17 g by mouth daily. Mix one tablespoon with 8oz of your favorite juice or water every day until you are having soft formed stools. Then start taking once daily if you didn't have a stool the day before. 03/21/17  Yes Willy Eddy, MD  sertraline (ZOLOFT) 25 MG tablet Take 1 tablet (25 mg total) by mouth daily. 02/24/18 05/25/18 Yes Trey Sailors, PA-C  potassium chloride (K-DUR) 10 MEQ tablet Take 1 tablet (10 mEq total) by mouth daily. 08/24/17 11/22/17  Sondra Barges, PA-C    Review of Systems    She has been dealing with some depression and is now seeking treatment.  She has some degree of chronic dyspnea exertion in the setting of weight gain over the years.  She continues to smoke half a pack a day.  She denies chest pain, palpitations, PND, orthopnea, dizziness, syncope, or early satiety.  She does sometimes note mild, dependent lower extremity swelling.  All other systems reviewed and are otherwise negative except as noted above.  Physical Exam    VS:  BP 112/84 (BP Location: Left Arm, Patient Position: Sitting, Cuff Size: Normal)   Pulse 75   Ht 5\' 5"  (1.651 m)   Wt 228 lb (103.4 kg)   BMI 37.94 kg/m  , BMI Body mass index is 37.94 kg/m. GEN: Well nourished, well developed, in no acute distress. HEENT: normal. Neck: Supple, no JVD, carotid bruits, or masses. Cardiac: RRR, no murmurs, rubs, or gallops. No clubbing, cyanosis, edema.  Radials/DP/PT 2+ and equal bilaterally.  Respiratory:  Respirations regular and unlabored, clear to auscultation bilaterally. GI: Soft, nontender, nondistended, BS + x 4. MS: no deformity or atrophy. Skin: warm and dry, no rash. Neuro:  Strength and sensation are intact. Psych: Normal  affect.  Accessory Clinical Findings    ECG personally reviewed by me today -regular sinus rhythm, 75, nonspecific T changes- no acute changes.  Lab Results  Component Value Date   WBC 10.1 02/24/2018   HGB 11.7 02/24/2018   HCT 34.9 02/24/2018   MCV 85 02/24/2018   PLT 358 02/24/2018   Lab Results  Component Value Date   CREATININE 0.75 02/24/2018   BUN 10 02/24/2018   NA 143 02/24/2018   K 4.0 02/24/2018   CL 105 02/24/2018   CO2 21 02/24/2018   Lab Results  Component Value Date   CHOL 135 02/24/2018   HDL 37 (L) 02/24/2018   LDLCALC 66 02/24/2018   TRIG 161 (H) 02/24/2018   CHOLHDL 3.6 02/24/2018    Lab Results  Component Value Date   ALT 14 02/24/2018   AST 18 02/24/2018   ALKPHOS 111 02/24/2018   BILITOT 0.3 02/24/2018   . Lab Results  Component Value Date   TSH  1.810 02/24/2018     Assessment & Plan    1.  Paroxysmal atrial fibrillation: He was noted on monitoring following stroke.  No recurrent palpitations.  She is in sinus rhythm today.  She remains on beta-blocker and Eliquis therapy and we will refill this today.  She just had labs yesterday which showed low normal H&H and normal renal function.  2.  History of stroke: No residual deficits.  On Eliquis in the setting of above.  Continue statin therapy.  3.  PFO: Small PFO noted on transesophageal echocardiogram.  She is on lifelong anticoagulation in the setting of A. fib.  No intervention required.  4.  Hypothyroidism: TSH was normal yesterday.  She remains on levothyroxine therapy.  5.  Tobacco and marijuana abuse: She still smoking half a pack a day and occasionally will take a hit off of a marijuana cigarette.  Complete cessation advised.  She says she smokes out of stress.  Hopefully treatment for depression will help to put her in a mindset that will allow her to quit.  6.  Depression: Started on Zoloft yesterday.  Followed by primary care.  7.  Morbid obesity: Encouraged exercise and  monitoring of caloric intake with a goal weight loss of 1 pound per week.  8.  Disposition: Follow-up in 6 months or sooner if necessary.   Nicolasa Duckinghristopher , NP 02/25/2018, 12:59 PM

## 2018-02-25 NOTE — Telephone Encounter (Signed)
Please review for refill, Thanks !  

## 2018-02-25 NOTE — Patient Instructions (Signed)
Medication Instructions:  - Your physician recommends that you continue on your current medications as directed. Please refer to the Current Medication list given to you today.  If you need a refill on your cardiac medications before your next appointment, please call your pharmacy.   Lab work: - none ordered  If you have labs (blood work) drawn today and your tests are completely normal, you will receive your results only by: Marland Kitchen. MyChart Message (if you have MyChart) OR . A paper copy in the mail If you have any lab test that is abnormal or we need to change your treatment, we will call you to review the results.  Testing/Procedures: - none ordered  Follow-Up: At [facility], you and your health needs are our priority.  As part of our continuing mission to provide you with exceptional heart care, we have created designated Provider Care Teams.  These Care Teams include your primary Cardiologist (physician) and Advanced Practice Providers (APPs -  Physician Assistants and Nurse Practitioners) who all work together to provide you with the care you need, when you need it. You will need a follow up appointment in 6 months.  Please call our office 2 months in advance to schedule this appointment.  You may see Yvonne Kendallhristopher End, MD or one of the following Advanced Practice Providers on your designated Care Team:   Nicolasa Duckinghristopher Berge, NP Eula Listenyan Dunn, PA-C . Marisue IvanJacquelyn Visser, PA-C  Any Other Special Instructions Will Be Listed Below (If Applicable). - N/A

## 2018-03-14 ENCOUNTER — Ambulatory Visit: Payer: Medicaid Other | Admitting: Physician Assistant

## 2018-03-14 NOTE — Progress Notes (Deleted)
       Patient: Margaret [name] Female    DOB: 12/31/1970   47 y.o.   MRN: [id number] Visit Date: 03/14/2018  Today's Provider: Trey SailorsAdriana M Pollak, PA-C   No chief complaint on file.  Subjective:    HPI Patient is here today for 2 weeks follow-up abdominal bloating and pap smear.    No Known Allergies   Current Outpatient Medications:  .  atorvastatin (LIPITOR) 40 MG tablet, take 1 tablet by mouth once daily AT 6PM, Disp: 30 tablet, Rfl: 6 .  ELIQUIS 5 MG TABS tablet, TAKE 1 TABLET BY MOUTH TWICE DAILY, Disp: 60 tablet, Rfl: 5 .  levothyroxine (SYNTHROID, LEVOTHROID) 100 MCG tablet, Take 1 tablet (100 mcg total) by mouth daily., Disp: 90 tablet, Rfl: 3 .  metoprolol tartrate (LOPRESSOR) 25 MG tablet, Take 1 tablet (25 mg total) by mouth 2 (two) times daily., Disp: 180 tablet, Rfl: 3 .  omeprazole (PRILOSEC) 40 MG capsule, take 1 capsule by mouth once daily .Marland Kitchen.Marland Kitchen.PLEASE CONTACT PRIMARY CARE PHYSICIAN FOR MORE REFILLS, Disp: 30 capsule, Rfl: 3 .  polyethylene glycol (MIRALAX / GLYCOLAX) packet, Take 17 g by mouth daily. Mix one tablespoon with 8oz of your favorite juice or water every day until you are having soft formed stools. Then start taking once daily if you didn't have a stool the day before., Disp: 30 each, Rfl: 0 .  potassium chloride (K-DUR) 10 MEQ tablet, Take 1 tablet (10 mEq total) by mouth daily., Disp: 3 tablet, Rfl: 0 .  sertraline (ZOLOFT) 25 MG tablet, Take 1 tablet (25 mg total) by mouth daily., Disp: 90 tablet, Rfl: 0  Review of Systems  Social History   Tobacco Use  . Smoking status: Current Every Day Smoker    Packs/day: 0.25    Types: Cigarettes  . Smokeless tobacco: Former NeurosurgeonUser    Types: Snuff  . Tobacco comment: trying to quit; going to start using patches;   Substance Use Topics  . Alcohol use: Yes    Alcohol/week: 0.0 standard drinks    Comment: on occasion   Objective:   There were no vitals taken for this visit. There were no vitals filed for  this visit.   Physical Exam      Assessment & Plan:           Trey SailorsAdriana M Pollak, PA-C  [facility]

## 2018-03-15 ENCOUNTER — Telehealth: Payer: Self-pay

## 2018-03-15 NOTE — Telephone Encounter (Signed)
-----   Message from Trey SailorsAdriana M [name], [place]PA-C sent at 03/14/2018  4:15 PM EST ----- Her cholesterol is well controlled. CMEt normal. CBC normal. TSH normal, continue taking 100 mcg synthroid. She no-showed her appointment today, please schedule for follow up. Please inform of late/no-show policy.

## 2018-03-15 NOTE — Telephone Encounter (Signed)
Patient was advised. Patient states that she was at urgent care with her daughter on yesterday  and will call back to reschedule appointment.

## 2018-03-19 ENCOUNTER — Other Ambulatory Visit: Payer: Self-pay

## 2018-03-19 ENCOUNTER — Ambulatory Visit: Payer: Medicaid Other | Admitting: Family Medicine

## 2018-03-19 ENCOUNTER — Encounter: Payer: Self-pay | Admitting: Family Medicine

## 2018-03-19 VITALS — BP 132/101 | HR 84 | Temp 98.2°F | Ht 65.0 in | Wt 231.0 lb

## 2018-03-19 DIAGNOSIS — J0140 Acute pansinusitis, unspecified: Secondary | ICD-10-CM | POA: Diagnosis not present

## 2018-03-19 DIAGNOSIS — E782 Mixed hyperlipidemia: Secondary | ICD-10-CM

## 2018-03-19 DIAGNOSIS — R058 Other specified cough: Secondary | ICD-10-CM

## 2018-03-19 DIAGNOSIS — I480 Paroxysmal atrial fibrillation: Secondary | ICD-10-CM

## 2018-03-19 DIAGNOSIS — I10 Essential (primary) hypertension: Secondary | ICD-10-CM

## 2018-03-19 DIAGNOSIS — R05 Cough: Secondary | ICD-10-CM

## 2018-03-19 MED ORDER — AMOXICILLIN-POT CLAVULANATE 875-125 MG PO TABS: 1.0000 | ORAL_TABLET | Freq: Two times a day (BID) | ORAL | 0 refills | Status: DC

## 2018-03-19 NOTE — Progress Notes (Signed)
Patient: Margaret [name] Female    DOB: 05/13/1970   47 y.o.   MRN: [id number] Visit Date: 03/19/2018  Today's Provider: Megan Mansichard Gilbert Jr, MD   Chief Complaint  Patient presents with  . Headache    started on 03/17/18  . Cough  . Sore Throat  . Nasal Congestion    greenish brown mucus   Subjective:     HPI  Pt reports body aches, cough, sore throat, headaches, a lot of congestion with greenish brown mucus that started on Thursday 03/17/18.     No Known Allergies   Current Outpatient Medications:  .  atorvastatin (LIPITOR) 40 MG tablet, take 1 tablet by mouth once daily AT 6PM, Disp: 30 tablet, Rfl: 6 .  ELIQUIS 5 MG TABS tablet, TAKE 1 TABLET BY MOUTH TWICE DAILY, Disp: 60 tablet, Rfl: 5 .  levothyroxine (SYNTHROID, LEVOTHROID) 100 MCG tablet, Take 1 tablet (100 mcg total) by mouth daily., Disp: 90 tablet, Rfl: 3 .  metoprolol tartrate (LOPRESSOR) 25 MG tablet, Take 1 tablet (25 mg total) by mouth 2 (two) times daily., Disp: 180 tablet, Rfl: 3 .  polyethylene glycol (MIRALAX / GLYCOLAX) packet, Take 17 g by mouth daily. Mix one tablespoon with 8oz of your favorite juice or water every day until you are having soft formed stools. Then start taking once daily if you didn't have a stool the day before., Disp: 30 each, Rfl: 0 .  sertraline (ZOLOFT) 25 MG tablet, Take 1 tablet (25 mg total) by mouth daily., Disp: 90 tablet, Rfl: 0 .  omeprazole (PRILOSEC) 40 MG capsule, take 1 capsule by mouth once daily .Marland Kitchen.Marland Kitchen.PLEASE CONTACT PRIMARY CARE PHYSICIAN FOR MORE REFILLS (Patient not taking: Reported on 03/19/2018), Disp: 30 capsule, Rfl: 3 .  potassium chloride (K-DUR) 10 MEQ tablet, Take 1 tablet (10 mEq total) by mouth daily., Disp: 3 tablet, Rfl: 0  Review of Systems  Constitutional: Positive for fatigue.  HENT: Positive for congestion (greenish brown), sinus pressure, sinus pain and sore throat. Negative for dental problem, drooling, ear discharge, ear pain, facial swelling,  hearing loss, mouth sores, nosebleeds, postnasal drip, rhinorrhea, sneezing, tinnitus, trouble swallowing and voice change.   Respiratory: Positive for cough, chest tightness, shortness of breath and wheezing. Negative for apnea, choking and stridor.   Cardiovascular: Negative.   Endocrine: Negative.   Allergic/Immunologic: Negative.   Neurological: Positive for headaches. Negative for dizziness, tremors, seizures, syncope, facial asymmetry, speech difficulty, weakness, light-headedness and numbness.  Psychiatric/Behavioral: Negative.     Social History   Tobacco Use  . Smoking status: Current Every Day Smoker    Packs/day: 0.25    Types: Cigarettes  . Smokeless tobacco: Former NeurosurgeonUser    Types: Snuff  . Tobacco comment: trying to quit; going to start using patches;   Substance Use Topics  . Alcohol use: Not Currently    Alcohol/week: 0.0 standard drinks    Comment: on occasion      Objective:   BP (!) 132/101 (BP Location: Left Arm, Patient Position: Sitting, Cuff Size: Large)   Pulse 84   Temp 98.2 F (36.8 C) (Oral)   Ht 5\' 5"  (1.651 m)   Wt 231 lb (104.8 kg)   SpO2 96%   BMI 38.44 kg/m  Vitals:   03/19/18 1008  BP: (!) 132/101  Pulse: 84  Temp: 98.2 F (36.8 C)  TempSrc: Oral  SpO2: 96%  Weight: 231 lb (104.8 kg)  Height: 5\' 5"  (1.651 m)  Physical Exam Constitutional:      Appearance: She is well-developed. She is obese.  HENT:     Head: Normocephalic and atraumatic.     Comments: Maxillary and ethmoid and frontal sinuses are mildly tender    Mouth/Throat:     Pharynx: Oropharynx is clear.  Neck:     Musculoskeletal: Neck supple. No neck rigidity.  Cardiovascular:     Rate and Rhythm: Normal rate and regular rhythm.     Heart sounds: Normal heart sounds.  Pulmonary:     Effort: Pulmonary effort is normal.     Breath sounds: Normal breath sounds.  Lymphadenopathy:     Cervical: No cervical adenopathy.  Skin:    General: Skin is warm and dry.    Neurological:     Mental Status: She is alert and oriented to person, place, and time.  Psychiatric:        Mood and Affect: Mood normal.        Speech: Speech normal.        Behavior: Behavior normal.         Assessment & Plan    1. Subacute pansinusitis Treat with Augmentin  2. Cough with sputum Treat with Robitussin and fluids Tylenol.  3. Paroxysmal atrial fibrillation (HCC)   4. Mixed hyperlipidemia   5. Hypertension, unspecified type Blood pressure up today.  Advised patient not to take decongestants.  I think is just because she is feeling poorly.  Low up with primary care in the future.    I have done the exam and reviewed the above chart and it is accurate to the best of my knowledge. Dentist has been used in this note in any air is in the dictation or transcription are unintentional.  Megan Mans, MD  [facility]

## 2018-03-22 ENCOUNTER — Ambulatory Visit: Payer: Self-pay | Admitting: Physician Assistant

## 2018-04-19 ENCOUNTER — Encounter: Payer: Self-pay | Admitting: Physician Assistant

## 2018-04-19 ENCOUNTER — Ambulatory Visit (INDEPENDENT_AMBULATORY_CARE_PROVIDER_SITE_OTHER): Payer: Medicaid Other | Admitting: Physician Assistant

## 2018-04-19 VITALS — BP 116/84 | HR 80 | Temp 98.5°F | Resp 16 | Wt 223.0 lb

## 2018-04-19 DIAGNOSIS — Z111 Encounter for screening for respiratory tuberculosis: Secondary | ICD-10-CM

## 2018-04-19 NOTE — Progress Notes (Signed)
Patient: Margaret [name] Female    DOB: 03/15/1971   48 y.o.   MRN: [id number] Visit Date: 04/19/2018  Today's Provider: Trey SailorsAdriana M Pollak, PA-C   Chief Complaint  Patient presents with  . Follow-up   Subjective:     HPI   Patient reports she is here to get retested for [facility]. Patient reports that she her chest x-ray was negative. Patient reports that she did have [facility] TB [facility] and was normal. Patient reports she was never treated for latent TB. She was born [place] the [place], has no known exposure to TB. Applying for new jobs currently. She denies cough, fever, night sweats, weight loss.   No Known Allergies   Current Outpatient Medications:  .  atorvastatin (LIPITOR) 40 MG tablet, take 1 tablet by mouth once daily AT 6PM, Disp: 30 tablet, Rfl: 6 .  ELIQUIS 5 MG TABS tablet, TAKE 1 TABLET BY MOUTH TWICE DAILY, Disp: 60 tablet, Rfl: 5 .  levothyroxine (SYNTHROID, LEVOTHROID) 100 MCG tablet, Take 1 tablet (100 mcg total) by mouth daily., Disp: 90 tablet, Rfl: 3 .  metoprolol tartrate (LOPRESSOR) 25 MG tablet, Take 1 tablet (25 mg total) by mouth 2 (two) times daily., Disp: 180 tablet, Rfl: 3 .  sertraline (ZOLOFT) 25 MG tablet, Take 1 tablet (25 mg total) by mouth daily., Disp: 90 tablet, Rfl: 0 .  potassium chloride (K-DUR) 10 MEQ tablet, Take 1 tablet (10 mEq total) by mouth daily., Disp: 3 tablet, Rfl: 0  Review of Systems   12 point ROS negative except for HPI.   Social History   Tobacco Use  . Smoking status: Current Every Day Smoker    Packs/day: 0.25    Types: Cigarettes  . Smokeless tobacco: Former NeurosurgeonUser    Types: Snuff  . Tobacco comment: trying to quit; going to start using patches;   Substance Use Topics  . Alcohol use: Not Currently    Alcohol/week: 0.0 standard drinks    Comment: on occasion      Objective:   BP 116/84 (BP Location: Left Arm,  Patient Position: Sitting, Cuff Size: Normal)   Pulse 80   Temp 98.5 F (36.9 C) (Oral)   Resp 16   Wt 223 lb (101.2 kg)   BMI 37.11 kg/m  Vitals:   04/19/18 0842  BP: 116/84  Pulse: 80  Resp: 16  Temp: 98.5 F (36.9 C)  TempSrc: Oral  Weight: 223 lb (101.2 kg)     Physical Exam Constitutional:      Appearance: Normal appearance.  Cardiovascular:     Rate and Rhythm: Normal rate and regular rhythm.     Heart sounds: Normal heart sounds.  Pulmonary:     Effort: Pulmonary effort is normal.     Breath sounds: Normal breath sounds.  Skin:    General: Skin is warm and dry.  Neurological:     General: No focal deficit present.     Mental Status: She is alert and oriented to person, place, and time.  Psychiatric:        Mood and Affect: Mood normal.        Behavior: Behavior normal.         Assessment & Plan    1. Screening-pulmonary TB  Patient is under the impression that she needs to go to [facility] to get CXR for TB screening.  However, her most recent quant gold was negative and so we can screen with this. Order as below.  Needs to return for PAP smear.   - QuantiFERON-TB Gold Plus  The entirety of the information documented in the History of Present Illness, Review of Systems and Physical Exam were personally obtained by me. Portions of this information were initially documented by Rondel Baton, CMA and reviewed by me for thoroughness and accuracy.   Return in about 1 month (around 05/20/2018).     Trey Sailors, PA-C  [facility]

## 2018-04-19 NOTE — Patient Instructions (Signed)
Tuberculosis  Tuberculosis (TB) is an infection that usually affects your lungs. It can affect other parts of your body too. There are two kinds of TB:  · Active TB. This means you have symptoms of TB. It also means your infection can spread to someone (you are contagious).  · Latent TB. This means you do not have any symptoms of TB. It also means you cannot spread TB to someone.  It is important to get treatment no matter what kind of TB you have.  Follow these instructions at home:         Medicines  · Take over-the-counter and prescription medicines only as told by your doctor.  · Take your antibiotic medicine as told by your doctor. Do not stop taking the antibiotic even if you start to feel better. You may need to take antibiotics for up to 6-9 months.  Activity  · Rest as needed. Ask your doctor what activities are safe for you.  · Do not go back to work or school until your doctor says it is safe to do that.  · Avoid close contact with others (especially babies and older people). Do this until your doctor says you will no longer spread TB.  General instructions  · Tell your doctor about all the people you live with and all the people you have close contact with. Those people may need to be tested for TB.  · Do not use any products that have nicotine or tobacco in them. These include cigarettes and e-cigarettes. If you need help quitting, ask your doctor.  · Cover your mouth and nose when you cough or sneeze. Get rid of used tissues as told by your doctor.  · Wash your hands often with soap and water. If you do not have soap and water, use hand sanitizer.  · Keep all follow-up visits as told by your doctor. This is important.  Contact a doctor if:  · You have new symptoms.  · You are not hungry (loss of appetite).  · You feel sick to your stomach (nauseous).  · You throw up (vomit).  · Your pee (urine) is dark yellow.  · Your skin or the white part of your eyes looks kind of yellow (jaundice).  · Your symptoms  get worse.  · Your symptoms do not go away with treatment.  · You have a fever.  Get help right away if:  · You have chest pain.  · You cough up blood.  · You have trouble breathing.  · You feel short of breath.  · You have a headache.  · You have a stiff neck.  Summary  · Tuberculosis (TB) is an infection that usually affects your lungs. It can affect other parts of your body too.  · The two kinds of TB are active TB and latent TB.  · Get treatment no matter what kind of TB you have.  · Take your antibiotic medicine as told by your doctor. Do not stop taking the antibiotic even if you start to feel better. You may need to take antibiotics for up to 6-9 months.  This information is not intended to replace advice given to you by your health care provider. Make sure you discuss any questions you have with your health care provider.  Document Released: 01/18/2009 Document Revised: 10/09/2016 Document Reviewed: 10/09/2016  Elsevier Interactive Patient Education © 2019 Elsevier Inc.

## 2018-04-22 ENCOUNTER — Telehealth: Payer: Self-pay

## 2018-04-22 LAB — QUANTIFERON-TB GOLD PLUS
QuantiFERON Mitogen Value: >10 IU/mL
QuantiFERON Nil Value: 0.03 IU/mL
QuantiFERON TB1 Ag Value: 0.06 IU/mL
QuantiFERON TB2 Ag Value: 0.03 IU/mL
QuantiFERON-TB Gold Plus: NEGATIVE

## 2018-04-22 NOTE — Telephone Encounter (Signed)
-----   Message from Trey Sailors, [place] sent at 04/22/2018  1:46 PM EST ----- TB gold negative.

## 2018-04-22 NOTE — Telephone Encounter (Signed)
Patient advised as below.  

## 2018-04-27 ENCOUNTER — Telehealth: Payer: Self-pay | Admitting: Physician Assistant

## 2018-04-27 NOTE — Telephone Encounter (Signed)
Pt asking for TB Test to be mailed to home address.  Thanks, [employer]

## 2018-05-02 ENCOUNTER — Ambulatory Visit: Payer: Medicaid Other | Admitting: Podiatry

## 2018-05-02 ENCOUNTER — Encounter: Payer: Self-pay | Admitting: Podiatry

## 2018-05-02 DIAGNOSIS — M79676 Pain in unspecified toe(s): Secondary | ICD-10-CM

## 2018-05-02 DIAGNOSIS — B351 Tinea unguium: Secondary | ICD-10-CM | POA: Diagnosis not present

## 2018-05-02 DIAGNOSIS — D689 Coagulation defect, unspecified: Secondary | ICD-10-CM

## 2018-05-02 NOTE — Progress Notes (Signed)
Complaint:  Visit Type: Patient returns to my office for continued preventative foot care services. Complaint: Patient states" my nails have grown long and thick and become painful to walk and wear shoes" Patient has been treated with eliquiss.. The patient presents for preventative foot care services. No changes to ROS  Podiatric Exam: Vascular: dorsalis pedis and posterior tibial pulses are palpable bilateral. Capillary return is immediate. Temperature gradient is WNL. Skin turgor WNL  Sensorium: Normal Semmes Weinstein monofilament test. Normal tactile sensation bilaterally. Nail Exam: Pt has thick disfigured discolored nails with subungual debris noted bilateral entire nail hallux through fifth toenails Ulcer Exam: There is no evidence of ulcer or pre-ulcerative changes or infection. Orthopedic Exam: Muscle tone and strength are WNL. No limitations in general ROM. No crepitus or effusions noted. Foot type and digits show no abnormalities. Bony prominences are unremarkable. Skin: No Porokeratosis. No infection or ulcers  Diagnosis:  Onychomycosis, , Pain in right toe, pain in left toes  Treatment & Plan Procedures and Treatment: Consent by patient was obtained for treatment procedures.   Debridement of mycotic and hypertrophic toenails, 1 through 5 bilateral and clearing of subungual debris. No ulceration, no infection noted.  Return Visit-Office Procedure: Patient instructed to return to the office for a follow up visit 3 months for continued evaluation and treatment.    Helane GuntherGregory Alysandra Lobue DPM

## 2018-05-18 ENCOUNTER — Telehealth: Payer: Self-pay | Admitting: Internal Medicine

## 2018-05-18 ENCOUNTER — Other Ambulatory Visit: Payer: Self-pay

## 2018-05-18 MED ORDER — POTASSIUM CHLORIDE ER 10 MEQ PO TBCR: 10.0000 meq | EXTENDED_RELEASE_TABLET | Freq: Every day | ORAL | 0 refills | Status: DC

## 2018-05-18 MED ORDER — APIXABAN 5 MG PO TABS: 5.0000 mg | ORAL_TABLET | Freq: Two times a day (BID) | ORAL | 1 refills | Status: DC

## 2018-05-18 MED ORDER — ATORVASTATIN CALCIUM 40 MG PO TABS: 40.0000 mg | ORAL_TABLET | Freq: Every day | ORAL | 0 refills | Status: DC

## 2018-05-18 NOTE — Telephone Encounter (Addendum)
Pt's wt 101.2 kg, age 48, serum creatinine 0.75, CrCl 149.74. Last ov w/ Ward Givens, NP 02/25/18. Eliquis 5 mg BID, #180, 1 refill [facility] to [facility].

## 2018-05-18 NOTE — Telephone Encounter (Signed)
°*  STAT* If patient is at the pharmacy, call can be transferred to refill team.   1. Which medications need to be refilled? (please list name of each medication and dose if known)  potassium chloride 10 MEQ 1 tablet daily  Eliquis 5 MG 1 tablet twice daily  Atorvastatin 40 MG 1 tablet daily   2. Which pharmacy/location (including street and city if local pharmacy) is medication to [facility] sent to[facility] [place]  3. Do they need a 30 day or 90 day supply? 90 day

## 2018-05-18 NOTE — Telephone Encounter (Signed)
Please advise for Eliquis 5MG  refill.   Potassium and atorvastatin have already been filled   Requested Prescriptions   Signed Prescriptions Disp Refills  . atorvastatin (LIPITOR) 40 MG tablet 180 tablet 0    Sig: Take 1 tablet (40 mg total) by mouth daily at 6 PM.    Authorizing Provider: Sondra Barges    Ordering User: Margrett Rud  . potassium chloride (K-DUR) 10 MEQ tablet 90 tablet 0    Sig: Take 1 tablet (10 mEq total) by mouth daily.    Authorizing Provider: Sondra Barges    Ordering User: Margrett Rud

## 2018-05-18 NOTE — Telephone Encounter (Signed)
Requested Prescriptions   Signed Prescriptions Disp Refills  . atorvastatin (LIPITOR) 40 MG tablet 180 tablet 0    Sig: Take 1 tablet (40 mg total) by mouth daily at 6 PM.    Authorizing Provider: Sondra Barges    Ordering User: Margrett Rud  . potassium chloride (K-DUR) 10 MEQ tablet 90 tablet 0    Sig: Take 1 tablet (10 mEq total) by mouth daily.    Authorizing Provider: Sondra Barges    Ordering User: Margrett Rud

## 2018-06-17 ENCOUNTER — Other Ambulatory Visit: Payer: Self-pay

## 2018-06-17 ENCOUNTER — Emergency Department
Admission: EM | Admit: 2018-06-17 | Discharge: 2018-06-18 | Disposition: A | Discharge status: Home / Self Care | Payer: Medicaid Other | Attending: Emergency Medicine | Admitting: Emergency Medicine

## 2018-06-17 ENCOUNTER — Encounter: Payer: Self-pay | Admitting: Physician Assistant

## 2018-06-17 ENCOUNTER — Ambulatory Visit: Payer: Medicaid Other | Admitting: Physician Assistant

## 2018-06-17 VITALS — BP 122/85 | HR 85 | Temp 98.6°F | Resp 16

## 2018-06-17 DIAGNOSIS — R1900 Intra-abdominal and pelvic swelling, mass and lump, unspecified site: Secondary | ICD-10-CM | POA: Diagnosis not present

## 2018-06-17 DIAGNOSIS — J45909 Unspecified asthma, uncomplicated: Secondary | ICD-10-CM | POA: Insufficient documentation

## 2018-06-17 DIAGNOSIS — R109 Unspecified abdominal pain: Secondary | ICD-10-CM | POA: Diagnosis not present

## 2018-06-17 DIAGNOSIS — Z79899 Other long term (current) drug therapy: Secondary | ICD-10-CM | POA: Diagnosis not present

## 2018-06-17 DIAGNOSIS — E039 Hypothyroidism, unspecified: Secondary | ICD-10-CM | POA: Insufficient documentation

## 2018-06-17 DIAGNOSIS — F17210 Nicotine dependence, cigarettes, uncomplicated: Secondary | ICD-10-CM | POA: Diagnosis not present

## 2018-06-17 DIAGNOSIS — F329 Major depressive disorder, single episode, unspecified: Secondary | ICD-10-CM | POA: Diagnosis present

## 2018-06-17 DIAGNOSIS — F331 Major depressive disorder, recurrent, moderate: Secondary | ICD-10-CM | POA: Insufficient documentation

## 2018-06-17 DIAGNOSIS — J069 Acute upper respiratory infection, unspecified: Secondary | ICD-10-CM | POA: Diagnosis not present

## 2018-06-17 LAB — COMPREHENSIVE METABOLIC PANEL
ALK PHOS: 92 U/L (ref 38–126)
ALT: 18 U/L (ref 0–44)
AST: 19 U/L (ref 15–41)
Albumin: 3.8 g/dL (ref 3.5–5.0)
Anion gap: 8 (ref 5–15)
BUN: 6 mg/dL (ref 6–20)
CO2: 27 mmol/L (ref 22–32)
CREATININE: 0.66 mg/dL (ref 0.44–1.00)
Calcium: 9 mg/dL (ref 8.9–10.3)
Chloride: 106 mmol/L (ref 98–111)
GFR calc Af Amer: >60 mL/min (ref >60)
GFR calc non Af Amer: >60 mL/min (ref >60)
Glucose, Bld: 106 mg/dL — ABNORMAL HIGH (ref 70–99)
Potassium: 3.3 mmol/L — ABNORMAL LOW (ref 3.5–5.1)
Sodium: 141 mmol/L (ref 135–145)
Total Bilirubin: 0.8 mg/dL (ref 0.3–1.2)
Total Protein: 7.2 g/dL (ref 6.5–8.1)

## 2018-06-17 LAB — URINE DRUG SCREEN, QUALITATIVE (ARMC ONLY)
Amphetamines, Ur Screen: NOT DETECTED
Barbiturates, Ur Screen: NOT DETECTED
Benzodiazepine, Ur Scrn: NOT DETECTED
CANNABINOID 50 NG, UR ~~LOC~~: NOT DETECTED
Cocaine Metabolite,Ur ~~LOC~~: NOT DETECTED
MDMA (Ecstasy)Ur Screen: NOT DETECTED
Methadone Scn, Ur: NOT DETECTED
Opiate, Ur Screen: NOT DETECTED
Phencyclidine (PCP) Ur S: NOT DETECTED
Tricyclic, Ur Screen: NOT DETECTED

## 2018-06-17 LAB — CBC
HCT: 39.4 % (ref 36.0–46.0)
Hemoglobin: 12.7 g/dL (ref 12.0–15.0)
MCH: 27.7 pg (ref 26.0–34.0)
MCHC: 32.2 g/dL (ref 30.0–36.0)
MCV: 85.8 fL (ref 80.0–100.0)
Platelets: 366 10*3/uL (ref 150–400)
RBC: 4.59 MIL/uL (ref 3.87–5.11)
RDW: 14.4 % (ref 11.5–15.5)
WBC: 15.2 10*3/uL — ABNORMAL HIGH (ref 4.0–10.5)
nRBC: 0 % (ref 0.0–0.2)

## 2018-06-17 LAB — TSH: TSH: 5.445 u[IU]/mL — ABNORMAL HIGH (ref 0.350–4.500)

## 2018-06-17 LAB — ETHANOL: Alcohol, Ethyl (B): <10 mg/dL (ref <10)

## 2018-06-17 LAB — ACETAMINOPHEN LEVEL: Acetaminophen (Tylenol), Serum: <10 ug/mL — ABNORMAL LOW (ref 10–30)

## 2018-06-17 LAB — SALICYLATE LEVEL: Salicylate Lvl: <7 mg/dL (ref 2.8–30.0)

## 2018-06-17 LAB — POCT PREGNANCY, URINE: Preg Test, Ur: NEGATIVE

## 2018-06-17 MED ORDER — SERTRALINE HCL 100 MG PO TABS: 100.0000 mg | ORAL_TABLET | Freq: Every day | ORAL | 2 refills | Status: DC

## 2018-06-17 NOTE — ED Notes (Signed)
Pt. Directed to room #2 in [facility].  Pt. Advised of cameras and safety checks.  Pt. Calm and cooperative.  Pt. Directed to come to this nurse for any questions or concerns.

## 2018-06-17 NOTE — ED Notes (Signed)
SOC called report given.  SOC machine set up in patients room.

## 2018-06-17 NOTE — ED Notes (Signed)
Called [facility] for consult 2002

## 2018-06-17 NOTE — ED Provider Notes (Signed)
[name] Emergency Department Provider Note   ____________________________________________   First MD Initiated Contact with Patient 06/17/18 1921     (approximate)  I have reviewed the triage vital signs and the nursing notes.   HISTORY  Chief Complaint Psychiatric Evaluation   HPI Margaret [name] is a 48 y.o. female was complaining of mood swings making it difficult for her to do her job responsibly.  She is increasingly more depressed having trouble sleeping may be overeating a little bit and gaining weight.  She does not have any energy and feels tired a lot.  She also has thyroid disease.  She had a mini stroke in the past and take blood thinners as well.         Past Medical History:  Diagnosis Date  . Anxiety   . Asthma    controlled  . CVA (cerebral vascular accident) (HCC)    a. 09/2016  . Depression   . Heart murmur   . Hypothyroidism   . PAF (paroxysmal atrial fibrillation) (HCC) 09/2016   a. 30-day event monitor 6/18: predominant rhythm of sinus with isolated PACs and PVCs.  Two narrow complex tachycardia episodes concerning for A. Fib +/-atrial flutter or SVT.  No prolonged pauses; b. CHADS2VASc = 3 (stroke x 2, female)--> Eliquis.  Marland Kitchen PFO (patent foramen ovale)    a. TTE 6/18: EF of 60 to 65%, normal wall motion, grade 1 diastolic dysfunction; b. TEE 6/18: evidence of a very small atrial level right to left shunt by bubble study without visualization of ASD or PFO  . Substance abuse (HCC)    Past use of cocaine   . Tobacco abuse   . Tuberculosis     Patient Active Problem List   Diagnosis Date Noted  . Paroxysmal atrial fibrillation (HCC) 10/29/2016  . PFO (patent foramen ovale) 09/23/2016  . Hyperlipidemia 09/17/2016  . IFG (impaired fasting glucose) 09/17/2016  . History of stroke 09/06/2016  . Myopia 12/20/2014  . Abdominal pain 12/20/2014  . Depression 12/20/2014  . Right arm pain 11/21/2014  . Thyroid activity  decreased 10/18/2014  . Abnormal CBC 10/18/2014  . Tobacco abuse 10/18/2014    Past Surgical History:  Procedure Laterality Date  . FRACTURE SURGERY    . TEE WITHOUT CARDIOVERSION N/A 09/08/2016   Procedure: TRANSESOPHAGEAL ECHOCARDIOGRAM (TEE);  Surgeon: [name], MD;  Location: [facility] ORS;  Service: Cardiovascular;  Laterality: N/A;    Prior to Admission medications   Medication Sig Start Date End Date Taking? Authorizing Provider  apixaban (ELIQUIS) 5 MG TABS tablet Take 1 tablet (5 mg total) by mouth 2 (two) times daily. 05/18/18   End, Cristal Deer, MD  atorvastatin (LIPITOR) 40 MG tablet Take 1 tablet (40 mg total) by mouth daily at 6 PM. 05/18/18   Dunn, Raymon Mutton, PA-C  levothyroxine (SYNTHROID, LEVOTHROID) 100 MCG tablet Take 1 tablet (100 mcg total) by mouth daily. 01/06/17   Particia Nearing, PA-C  metoprolol tartrate (LOPRESSOR) 25 MG tablet Take 1 tablet (25 mg total) by mouth 2 (two) times daily. 02/25/18 02/25/19  Creig Hines, NP  potassium chloride (K-DUR) 10 MEQ tablet Take 1 tablet (10 mEq total) by mouth daily. 05/18/18 08/16/18  Sondra Barges, PA-C  sertraline (ZOLOFT) 25 MG tablet Take 1 tablet (25 mg total) by mouth daily. 02/24/18 05/25/18  Trey Sailors, PA-C    Allergies Patient has no known allergies.  Family History  Problem Relation Age of Onset  . Stroke  Father     Social History Social History   Tobacco Use  . Smoking status: Current Every Day Smoker    Packs/day: 0.25    Types: Cigarettes  . Smokeless tobacco: Former Neurosurgeon    Types: Snuff  . Tobacco comment: trying to quit; going to start using patches;   Substance Use Topics  . Alcohol use: Not Currently    Alcohol/week: 0.0 standard drinks    Comment: on occasion  . Drug use: Not Currently    Types: Marijuana    Comment: Previously, but stopped years ago    Review of Systems  Constitutional: No fever/chills Eyes: No visual changes. ENT: No sore throat.  Cardiovascular: Denies chest pain. Respiratory: Denies shortness of breath. Gastrointestinal: No abdominal pain.  No nausea, no vomiting.  No diarrhea.  No constipation. Genitourinary: Negative for dysuria. Musculoskeletal: Negative for back pain. Skin: Negative for rash. Neurological: Negative for headaches, focal weakness Allergic/Immunilogical: **}  ____________________________________________   PHYSICAL EXAM:  VITAL SIGNS: ED Triage Vitals  Enc Vitals Group     BP 06/17/18 1813 (!) 137/100     Pulse Rate 06/17/18 1813 93     Resp 06/17/18 1813 16     Temp 06/17/18 1813 98.3 F (36.8 C)     Temp Source 06/17/18 1813 Oral     SpO2 06/17/18 1813 100 %     Weight --      Height --      Head Circumference --      Peak Flow --      Pain Score 06/17/18 1816 7     Pain Loc --      Pain Edu? --      Excl. in GC? --     Constitutional: Alert and oriented.  Looks a little depressed. Eyes: Conjunctivae are normal.  Head: Atraumatic. Nose: No congestion/rhinnorhea. Mouth/Throat: Mucous membranes are moist.  Oropharynx non-erythematous. Neck: No stridor.   Cardiovascular: Normal rate, regular rhythm. Grossly normal heart sounds.  Good peripheral circulation. Respiratory: Normal respiratory effort.  No retractions. Lungs CTAB. Gastrointestinal: Soft and nontender. No distention. No abdominal bruits. Musculoskeletal: No lower extremity tenderness nor edema.  Neurologic:  Normal speech and language. No gross focal neurologic deficits are appreciated.  Skin:  Skin is warm, dry and intact. No rash noted.   ____________________________________________   LABS (all labs ordered are listed, but only abnormal results are displayed)  Labs Reviewed  COMPREHENSIVE METABOLIC PANEL - Abnormal; Notable for the following components:      Result Value   Potassium 3.3 (*)    Glucose, Bld 106 (*)    All other components within normal limits  ACETAMINOPHEN LEVEL - Abnormal; Notable for  the following components:   Acetaminophen (Tylenol), Serum <10 (*)    All other components within normal limits  CBC - Abnormal; Notable for the following components:   WBC 15.2 (*)    All other components within normal limits  TSH - Abnormal; Notable for the following components:   TSH 5.445 (*)    All other components within normal limits  ETHANOL  SALICYLATE LEVEL  URINE DRUG SCREEN, QUALITATIVE ([facility] ONLY)  POC URINE PREG, ED  POCT PREGNANCY, URINE   ____________________________________________  EKG   ____________________________________________  RADIOLOGY  ED MD interpretation  Official radiology report(s): No results found.  ____________________________________________   PROCEDURES  Procedure(s) performed (including Critical Care):  Procedures   ____________________________________________   INITIAL IMPRESSION / ASSESSMENT AND PLAN / ED COURSE  ____________________________________________   FINAL CLINICAL IMPRESSION(S) / ED DIAGNOSES  Final diagnoses:  Moderate episode of recurrent major depressive disorder [facility])     ED Discharge Orders    None       Note:  This document was prepared using Dragon voice recognition software and may include unintentional dictation errors.    Arnaldo Natal, MD 06/17/18 2152

## 2018-06-17 NOTE — ED Triage Notes (Signed)
Pt states "I want to talk to someone." when asked to clarify pt states it louder. States "i've been up and down with mood swings." when asked how long she has felt this way she states "it's been a minute." denies SI or HI. States she's been fatigued, don't want to go to work, a lot of stressors, been crying more lately.

## 2018-06-17 NOTE — ED Notes (Addendum)
Pt dressed out by this RN and Trenton Founds EDT.   Pt has gray shirt, gray sweat pants, black boots, brown purse, pink color, animal print underwear. Pt informed to turn cell phone off. Cell phone inside of purse.

## 2018-06-17 NOTE — ED Notes (Signed)
Pt. Requested to use phone so she could call mother who is watching her child that she may be here for the night.

## 2018-06-17 NOTE — ED Notes (Signed)
Pt. Returned phone and stated that she would rather just go home tonight.  Pt. Asked was anything said on phone that changed your mind about talking to psychiatrist.  Pt. Stated "No, I just want to go home, I am feeling better".  MD informed of patient request.

## 2018-06-17 NOTE — ED Notes (Signed)
Pt. Advised doctor would be in room to talk to her.

## 2018-06-17 NOTE — Progress Notes (Signed)
Patient: Margaret [name] Female    DOB: Aug 31, 1970   48 y.o.   MRN: [id number] Visit Date: 06/17/2018  Today's Provider: Trey Sailors, PA-C   Chief Complaint  Patient presents with  . URI   Subjective:     URI   This is a new problem. The current episode started 1 to 4 weeks ago. The problem has been gradually worsening. There has been no fever. Associated symptoms include abdominal pain, congestion, coughing, headaches, nausea, sinus pain and a sore throat. Pertinent negatives include no diarrhea or wheezing. Treatments tried: Muscle cream, acid reflux medication.   Patient also complains that she has abdominal pain. She asks if I remember her telling me about this several months ago. She reports she has abdominal pain for a week and it feels like something is stuck there. She says her abdomen is very big and that her abdomen has never been this big.   Wt Readings from Last 3 Encounters:  04/19/18 223 lb (101.2 kg)  03/19/18 231 lb (104.8 kg)  02/25/18 228 lb (103.4 kg)     No Known Allergies   Current Outpatient Medications:  .  apixaban (ELIQUIS) 5 MG TABS tablet, Take 1 tablet (5 mg total) by mouth 2 (two) times daily., Disp: 180 tablet, Rfl: 1 .  atorvastatin (LIPITOR) 40 MG tablet, Take 1 tablet (40 mg total) by mouth daily at 6 PM., Disp: 180 tablet, Rfl: 0 .  levothyroxine (SYNTHROID, LEVOTHROID) 100 MCG tablet, Take 1 tablet (100 mcg total) by mouth daily., Disp: 90 tablet, Rfl: 3 .  metoprolol tartrate (LOPRESSOR) 25 MG tablet, Take 1 tablet (25 mg total) by mouth 2 (two) times daily., Disp: 180 tablet, Rfl: 3 .  potassium chloride (K-DUR) 10 MEQ tablet, Take 1 tablet (10 mEq total) by mouth daily., Disp: 90 tablet, Rfl: 0 .  sertraline (ZOLOFT) 25 MG tablet, Take 1 tablet (25 mg total) by mouth daily., Disp: 90 tablet, Rfl: 0  Review of Systems  Constitutional: Positive for fatigue. Negative for fever.  HENT: Positive for congestion, postnasal drip,  sinus pressure, sinus pain and sore throat.   Respiratory: Positive for cough and shortness of breath ("a little"). Negative for chest tightness and wheezing.   Cardiovascular: Positive for leg swelling ("knee" chronic).  Gastrointestinal: Positive for abdominal pain and nausea. Negative for diarrhea.  Neurological: Positive for headaches.    Social History   Tobacco Use  . Smoking status: Current Every Day Smoker    Packs/day: 0.25    Types: Cigarettes  . Smokeless tobacco: Former Neurosurgeon    Types: Snuff  . Tobacco comment: trying to quit; going to start using patches;   Substance Use Topics  . Alcohol use: Not Currently    Alcohol/week: 0.0 standard drinks    Comment: on occasion      Objective:   BP 122/85 (BP Location: Left Arm, Patient Position: Sitting, Cuff Size: Large)   Pulse 85   Temp 98.6 F (37 C) (Oral)   Resp 16  Vitals:   06/17/18 0829  BP: 122/85  Pulse: 85  Resp: 16  Temp: 98.6 F (37 C)  TempSrc: Oral     Physical Exam Constitutional:      Appearance: Normal appearance.  HENT:     Right Ear: Tympanic membrane and ear canal normal.     Left Ear: Tympanic membrane and ear canal normal.  Cardiovascular:     Rate and Rhythm: Normal rate and regular  rhythm.     Pulses: Normal pulses.     Heart sounds: Normal heart sounds.  Pulmonary:     Effort: Pulmonary effort is normal.     Breath sounds: Normal breath sounds.  Abdominal:     Comments: Abdomen is obese. There is no tenderness. There is a slight dense feeling area superior to her umbilicus.   Skin:    General: Skin is warm and dry.  Neurological:     Mental Status: She is alert and oriented to person, place, and time. Mental status is at baseline.  Psychiatric:        Mood and Affect: Mood normal.        Behavior: Behavior normal.         Assessment & Plan    1. Upper respiratory tract infection, unspecified type  Exam benign.   2. Abdominal pain, unspecified abdominal location   Her abdomen appears obese, it appears like an abnormal obese abdomen. I assured patient I do remember her telling me this and that I dedicated a scheduled follow up which she no showed. Patient says "yes." Potential hernia, though not visible on exam. Will get ultrasound to see if we can confirm.   Patient reports difficulty having bowel movement. Advised capful of miralax dissolved in liquid of choice daily.   - US Abdomen Complete; Future  3. Abdominal mass, unspecified abdominal location  - US Abdomen Complete; Future  The entirety of the information documented in the History of Present Illness, Review of Systems and Physical Exam were personally obtained by me. Portions of this information were initially documented by Hetty Ely, CMA and reviewed by me for thoroughness and accuracy.   I have spent 25 minutes with this patient, >50% of which was spent on counseling and coordination of care.         Trey Sailors, PA-C  [facility]

## 2018-06-17 NOTE — ED Notes (Signed)
Patient assigned to appropriate care area   Introduced self to pt  Patient oriented to unit/care area: Informed that, for their safety, care areas are designed for safety and visiting and phone hours explained to patient. Patient verbalizes understanding, and verbal contract for safety obtained  Environment secured  Pt is very tearful  Pt states "I want to talk to someone." when asked to clarify pt states it louder. States "i've been up and down with mood swings." when asked how long she has felt this way she states "it's been a minute." denies SI or HI. States she's been fatigued, don't want to go to work, a lot of stressors, been crying more lately.

## 2018-06-17 NOTE — ED Notes (Signed)
Patient finished SOC, SOC machine taken out of room.

## 2018-06-17 NOTE — Patient Instructions (Signed)

## 2018-06-17 NOTE — Discharge Instructions (Signed)
The psychiatrist wants to start you on Zoloft 100 mg a day.  Please also follow-up either with your regular therapist or see [facility].  [facility]'s contact information is on the other sheet of paper that included with this packet.  Please return here for any further problems especially if you ever feel suicidal or homicidal.

## 2018-06-17 NOTE — ED Notes (Signed)
Orange Juice and a warm blanket was given at this time.

## 2018-06-18 NOTE — ED Notes (Signed)
Pt. Going home by self. 

## 2018-06-21 ENCOUNTER — Other Ambulatory Visit: Payer: Self-pay

## 2018-06-21 ENCOUNTER — Encounter
Admission: RE | Admit: 2018-06-21 | Discharge: 2018-06-21 | Disposition: A | Discharge status: Home / Self Care | Payer: Medicaid Other | Source: Ambulatory Visit | Attending: Orthopedic Surgery | Admitting: Orthopedic Surgery

## 2018-06-21 ENCOUNTER — Telehealth: Payer: Self-pay | Admitting: Internal Medicine

## 2018-06-21 DIAGNOSIS — Z01812 Encounter for preprocedural laboratory examination: Secondary | ICD-10-CM | POA: Diagnosis not present

## 2018-06-21 HISTORY — DX: Cardiac arrhythmia, unspecified: I49.9

## 2018-06-21 HISTORY — DX: Essential (primary) hypertension: I10

## 2018-06-21 NOTE — Patient Instructions (Signed)
Your procedure is scheduled on: 06/23/18 Report to Day Surgery. [facility]OND FLOOR To find out your arrival time please call [phone] between 1PM - 3PM on 06/22/18.  Remember: Instructions that are not followed completely may result in serious medical risk,  up to and including death, or upon the discretion of your surgeon and anesthesiologist your  surgery may need to be rescheduled.     _X__ 1. Do not eat food after midnight the night before your procedure.                 No gum chewing or hard candies. You may drink clear liquids up to 2 hours                 before you are scheduled to arrive for your surgery- DO not drink clear                 liquids within 2 hours of the start of your surgery.                 Clear Liquids include:  water, apple juice without pulp, clear carbohydrate                 drink such as Clearfast of Gatorade, Black Coffee or Tea (Do not add                 anything to coffee or tea).  __X__2.  On the morning of surgery brush your teeth with toothpaste and water, you                may rinse your mouth with mouthwash if you wish.  Do not swallow any toothpaste of mouthwash.     _X__ 3.  No Alcohol for 24 hours before or after surgery.   _X__ 4.  Do Not Smoke or use e-cigarettes For 24 Hours Prior to Your Surgery.                 Do not use any chewable tobacco products for at least 6 hours prior to                 surgery.  ____  5.  Bring all medications with you on the day of surgery if instructed.   _X___  6.  Notify your doctor if there is any change in your medical condition      (cold, fever, infections).     Do not wear jewelry, make-up, hairpins, clips or nail polish. Do not wear lotions, powders, or perfumes. You may wear deodorant. Do not shave 48 hours prior to surgery. Men may shave face and neck. Do not bring valuables to the hospital.    [facility] is not responsible for any belongings or  valuables.  Contacts, dentures or bridgework may not be worn into surgery. Leave your suitcase in the car. After surgery it may be brought to your room. For patients admitted to the hospital, discharge time is determined by your treatment team.   Patients discharged the day of surgery will not be allowed to drive home.   Please read over the following fact sheets that you were given:   Surgical Site Infection Prevention          X___ Take these medicines the morning of surgery with A SIP OF WATER:    1. LEVOTHYROXINE  2. METOPROLOL  3. SERTRALINE  4.  5.  6.  ____ Fleet Enema (as directed)  _XUse CHG Soap as directed  ____ Use inhalers on the day of surgery  ____ Stop metformin 2 days prior to surgery    ____ Take 1/2 of usual insulin dose the night before surgery. No insulin the morning          of surgery.   _X___ Stop Coumadin/Plavix/aspirin on    STOP ELIQUIS AS INSTRUCTED BY DR [facility] OFFICE.   ____ Stop Anti-inflammatories on    ____ Stop supplements until after surgery.    ____ Bring C-Pap to the hospital.

## 2018-06-21 NOTE — Telephone Encounter (Signed)
Patient calling to get advice on when to hold eliquis for upcoming surgery.  Patient aware we have not received a card/ pharm clearance from Dr. Rosita Kea office and she was advised to call them and have them send a clearance form via fax.

## 2018-06-23 ENCOUNTER — Telehealth: Payer: Self-pay | Admitting: Physician Assistant

## 2018-06-23 ENCOUNTER — Ambulatory Visit: Admission: RE | Admit: 2018-06-23 | Payer: Medicaid Other | Source: Ambulatory Visit | Admitting: Orthopedic Surgery

## 2018-06-23 ENCOUNTER — Encounter: Admission: RE | Payer: Self-pay | Source: Ambulatory Visit

## 2018-06-23 SURGERY — ARTHROSCOPY, KNEE, WITH MEDIAL MENISCECTOMY
Anesthesia: Choice | Laterality: Left

## 2018-06-23 NOTE — Telephone Encounter (Signed)
Signed.

## 2018-06-23 NOTE — Telephone Encounter (Signed)
Per Ricki Rodriguez looking for hernia. [place] tech is going to change the order and send back to [place] for provider to sign.

## 2018-06-23 NOTE — Telephone Encounter (Signed)
Raynelle Fanning with [facility], [facility], [facility] - [phone]  Has questions on tomorrow's ultrasound of pt.  Please advise.  Thanks, [employer]

## 2018-06-24 ENCOUNTER — Telehealth: Payer: Self-pay | Admitting: Physician Assistant

## 2018-06-24 ENCOUNTER — Ambulatory Visit: Admission: RE | Admit: 2018-06-24 | Payer: Medicaid Other | Source: Ambulatory Visit

## 2018-06-24 NOTE — Telephone Encounter (Signed)
Requesting a copy of her TB testing that was recently done.  She needs to come by on Mon - 3-23.  Thanks, [employer]

## 2018-06-24 NOTE — Telephone Encounter (Signed)
Printed and placed up front ready for pick up.

## 2018-06-28 ENCOUNTER — Ambulatory Visit
Admission: RE | Admit: 2018-06-28 | Discharge: 2018-06-28 | Disposition: A | Discharge status: Home / Self Care | Payer: Medicaid Other | Source: Ambulatory Visit | Attending: Physician Assistant | Admitting: Physician Assistant

## 2018-06-28 ENCOUNTER — Telehealth: Payer: Self-pay

## 2018-06-28 ENCOUNTER — Ambulatory Visit: Payer: Medicaid Other

## 2018-06-28 ENCOUNTER — Other Ambulatory Visit: Payer: Self-pay

## 2018-06-28 DIAGNOSIS — R109 Unspecified abdominal pain: Secondary | ICD-10-CM | POA: Diagnosis not present

## 2018-06-28 DIAGNOSIS — R1900 Intra-abdominal and pelvic swelling, mass and lump, unspecified site: Secondary | ICD-10-CM | POA: Diagnosis present

## 2018-06-28 DIAGNOSIS — K439 Ventral hernia without obstruction or gangrene: Secondary | ICD-10-CM

## 2018-06-28 NOTE — Telephone Encounter (Signed)
-----   Message from Trey Sailors, [place] sent at 06/28/2018  9:27 AM EDT ----- Likely small hernia containing fat and not intestinal wall. Follow up with surgery if she wants evaluation, may not even require procedure. If she wants I will place referral but they are likely not scheduling non emergent visits immediately, she may have to wait.

## 2018-06-28 NOTE — Telephone Encounter (Signed)
Referral placed.

## 2018-06-28 NOTE — Telephone Encounter (Signed)
Patient advised as directed below. She is asking if referral can be put in.

## 2018-06-29 ENCOUNTER — Ambulatory Visit: Payer: Medicaid Other

## 2018-07-07 ENCOUNTER — Encounter: Payer: Self-pay | Admitting: *Deleted

## 2018-07-29 ENCOUNTER — Telehealth: Payer: Self-pay | Admitting: *Deleted

## 2018-07-29 ENCOUNTER — Other Ambulatory Visit: Payer: Self-pay | Admitting: *Deleted

## 2018-07-29 MED ORDER — APIXABAN 5 MG PO TABS: 5.0000 mg | ORAL_TABLET | Freq: Two times a day (BID) | ORAL | 1 refills | Status: DC

## 2018-07-29 NOTE — Telephone Encounter (Signed)
Virtual Visit Pre-Appointment Phone Call  "(Name), I am calling you today to discuss your upcoming appointment. We are currently trying to limit exposure to the virus that causes COVID-19 by seeing patients at home rather than in the office."  1. "What is the BEST phone number to call the day of the visit?" - include this in appointment notes  2. "Do you have or have access to (through a family member/friend) a smartphone with video capability that we can use for your visit?" a. If yes - list this number in appt notes as "cell" (if different from BEST phone #) and list the appointment type as a VIDEO visit in appointment notes b. If no - list the appointment type as a PHONE visit in appointment notes  3. Confirm consent - "In the setting of the current Covid19 crisis, you are scheduled for a (VIDEO) visit with your provider on (08/05/2018) at (3:30 PM).  Just as we do with many in-office visits, in order for you to participate in this visit, we must obtain consent.  If you'd like, I can send this to your mychart (if signed up) or email for you to review.  Otherwise, I can obtain your verbal consent now.  All virtual visits are billed to your insurance company just like a normal visit would be.  By agreeing to a virtual visit, we'd like you to understand that the technology does not allow for your provider to perform an examination, and thus may limit your provider's ability to fully assess your condition. If your provider identifies any concerns that need to be evaluated in person, we will make arrangements to do so.  Finally, though the technology is pretty good, we cannot assure that it will always work on either your or our end, and in the setting of a video visit, we may have to convert it to a phone-only visit.  In either situation, we cannot ensure that we have a secure connection.  Are you willing to proceed?" YES 4. Advise patient to be prepared - "Two hours prior to your appointment, go ahead  and check your blood pressure, pulse, oxygen saturation, and your weight (if you have the equipment to check those) and write them all down. When your visit starts, your provider will ask you for this information. If you have an Apple Watch or Kardia device, please plan to have heart rate information ready on the day of your appointment. Please have a pen and paper handy nearby the day of the visit as well."  5. Give patient instructions for MyChart download to smartphone OR [email or web address] as below if video visit (depending on what platform provider is using)  6. Inform patient they will receive a phone call 15 minutes prior to their appointment time (may be from unknown caller ID) so they should be prepared to answer    TELEPHONE CALL NOTE  Shunteria Bish has been deemed a candidate for a follow-up tele-health visit to limit community exposure during the Covid-19 pandemic. I spoke with the patient via phone to ensure availability of phone/video source, confirm preferred email & phone number, and discuss instructions and expectations.  I reminded Charlesetta Garibaldi to be prepared with any vital sign and/or heart rhythm information that could potentially be obtained via home monitoring, at the time of her visit. I reminded Mirrah Striano to expect a phone call prior to her visit.  Fontella Shan C, CMA 07/29/2018 1:14 PM   INSTRUCTIONS FOR DOWNLOADING  THE MYCHART APP TO SMARTPHONE  - The patient must first make sure to have activated MyChart and know their login information - If Apple, go to [employer] and type in MyChart in the search bar and download the app. If Android, ask patient to go to [employer] and type in [facility] in the search bar and download the app. The app is free but as with any other app downloads, their phone may require them to verify saved payment information or Apple/Android password.  - The patient will need to then log into the app with their MyChart  username and password, and select [facility] as their healthcare provider to link the account. When it is time for your visit, go to the MyChart app, find appointments, and click Begin Video Visit. Be sure to Select Allow for your device to access the Microphone and Camera for your visit. You will then be connected, and your provider will be with you shortly.  **If they have any issues connecting, or need assistance please contact MyChart service desk [phone])**  **If using a computer, in order to ensure the best quality for their visit they will need to use either of the following Internet Browsers: [employer], or Google Chrome**  IF USING DOXIMITY or DOXY.ME - The patient will receive a link just prior to their visit by text.     FULL LENGTH CONSENT FOR TELE-HEALTH VISIT   I hereby voluntarily request, consent and authorize [facility] and its employed or contracted physicians, physician assistants, nurse practitioners or other licensed health care professionals (the Practitioner), to provide me with telemedicine health care services (the "Services") as deemed necessary by the treating Practitioner. I acknowledge and consent to receive the Services by the Practitioner via telemedicine. I understand that the telemedicine visit will involve communicating with the Practitioner through live audiovisual communication technology and the disclosure of certain medical information by electronic transmission. I acknowledge that I have been given the opportunity to request an in-person assessment or other available alternative prior to the telemedicine visit and am voluntarily participating in the telemedicine visit.  I understand that I have the right to withhold or withdraw my consent to the use of telemedicine in the course of my care at any time, without affecting my right to future care or treatment, and that the Practitioner or I may terminate the telemedicine visit at any  time. I understand that I have the right to inspect all information obtained and/or recorded in the course of the telemedicine visit and may receive copies of available information for a reasonable fee.  I understand that some of the potential risks of receiving the Services via telemedicine include:  Marland Kitchen Delay or interruption in medical evaluation due to technological equipment failure or disruption; . Information transmitted may not be sufficient (e.g. poor resolution of images) to allow for appropriate medical decision making by the Practitioner; and/or  . In rare instances, security protocols could fail, causing a breach of personal health information.  Furthermore, I acknowledge that it is my responsibility to provide information about my medical history, conditions and care that is complete and accurate to the best of my ability. I acknowledge that Practitioner's advice, recommendations, and/or decision may be based on factors not within their control, such as incomplete or inaccurate data provided by me or distortions of diagnostic images or specimens that may result from electronic transmissions. I understand that the practice of medicine is not an exact science and that Practitioner  makes no warranties or guarantees regarding treatment outcomes. I acknowledge that I will receive a copy of this consent concurrently upon execution via email to the email address I last provided but may also request a printed copy by calling the office of [facility].    I understand that my insurance will be billed for this visit.   I have read or had this consent read to me. . I understand the contents of this consent, which adequately explains the benefits and risks of the Services being provided via telemedicine.  . I have been provided ample opportunity to ask questions regarding this consent and the Services and have had my questions answered to my satisfaction. . I give my informed consent for the services  to be provided through the use of telemedicine in my medical care  By participating in this telemedicine visit I agree to the above.

## 2018-08-10 NOTE — Progress Notes (Signed)
Erroneous encounter

## 2018-08-11 ENCOUNTER — Other Ambulatory Visit: Payer: Self-pay

## 2018-08-11 ENCOUNTER — Encounter: Payer: Self-pay | Admitting: Internal Medicine

## 2018-08-11 ENCOUNTER — Encounter: Payer: Medicaid Other | Admitting: Internal Medicine

## 2018-08-12 ENCOUNTER — Other Ambulatory Visit: Payer: Self-pay

## 2018-08-12 MED ORDER — APIXABAN 5 MG PO TABS: 5.0000 mg | ORAL_TABLET | Freq: Two times a day (BID) | ORAL | 1 refills | Status: DC

## 2018-08-12 NOTE — Telephone Encounter (Signed)
*  STAT* If patient is at the pharmacy, call can be transferred to refill team.   1. Which medications need to be refilled? (please list name of each medication and dose if known) Eliquis  2. Which pharmacy/location (including street and city if local pharmacy) is medication to be sent to? [facility]  3. Do they need a 30 day or 90 day supply? 30

## 2018-08-12 NOTE — Telephone Encounter (Signed)
Please review for refill, Thanks !  

## 2018-08-12 NOTE — Telephone Encounter (Signed)
47yo, 104.8kg, Scr 0.66 on 06/17/18 Last OV 02/25/18 Indication afib

## 2018-08-13 NOTE — Progress Notes (Signed)
Virtual Visit via Video Note   This visit type was conducted due to national recommendations for restrictions regarding the COVID-19 Pandemic (e.g. social distancing) in an effort to limit this patient's exposure and mitigate transmission in our community.  Due to her co-morbid illnesses, this patient is at least at moderate risk for complications without adequate follow up.  This format is felt to be most appropriate for this patient at this time.  All issues noted in this document were discussed and addressed.  A limited physical exam was performed with this format.  Please refer to the patient's chart for her consent [facility]   ID:  Margaret [name], DOB 1970/08/06, MRN [id number]  Patient Location: Home Provider Location: Home  PCP:  Trey Sailors, PA-C  Cardiologist:  [name], MD Electrophysiologist:  None   Evaluation Performed:  Follow-Up Visit  Chief Complaint:  Shortness of breath and fatigue  History of Present Illness:    Margaret [name] is a 48 y.o. female with history of paroxysmal atrial fibrillation complicated by stroke, PFO, hypothyroidism, asthma, and remote history of polysubstance abuse.  We are speaking today for follow-up of her atrial fibrillation.  She was last seen in our office in 02/2018 by Ward Givens, NP.  At that time, she noted chronic exertional weight gain and progressive weight gain due to a sedentary lifestyle.  She was also dealing with depression at that time of her last visit with [place] and had just been started on sertraline by her PCP.  Today, Margaret [name] reports that she has felt short of breath with activity over the last month.  She denies chest pain, palpitations, orthopnea, and edema.  She has put on a bit of weight over this time and wonders if this is contributing to her dyspnea.  She also feels generally more fatigued and depressed.  Abilify was recently added by her psychiatrist,  though she has not picked up this prescription yet and wonders about potential cardiac side effects.  She remains on sertraline, which had initially helped improve her mood.  She does not exercise regularly.  She remains compliant with her medications and denies significant bleeding on apixaban.  She has not developed any focal neurologic deficits since our last visit.  The patient does not have symptoms concerning for COVID-19 infection (fever, chills, cough, or new shortness of breath).    Past Medical History:  Diagnosis Date  . Anxiety   . Asthma    controlled  . CVA (cerebral vascular accident) (HCC)    a. 09/2016  . Depression   . Dysrhythmia   . Heart murmur   . Hypertension   . Hypothyroidism   . PAF (paroxysmal atrial fibrillation) (HCC) 09/2016   a. 30-day event monitor 6/18: predominant rhythm of sinus with isolated PACs and PVCs.  Two narrow complex tachycardia episodes concerning for A. Fib +/-atrial flutter or SVT.  No prolonged pauses; b. CHADS2VASc = 3 (stroke x 2, female)--> Eliquis.  Marland Kitchen PFO (patent foramen ovale)    a. TTE 6/18: EF of 60 to 65%, normal wall motion, grade 1 diastolic dysfunction; b. TEE 6/18: evidence of a very small atrial level right to left shunt by bubble study without visualization of ASD or PFO  . Substance abuse (HCC)    Past use of cocaine   . Tobacco abuse    Past Surgical History:  Procedure Laterality Date  . FRACTURE SURGERY    .  TEE WITHOUT CARDIOVERSION N/A 09/08/2016   Procedure: TRANSESOPHAGEAL ECHOCARDIOGRAM (TEE);  Surgeon: Yvonne KendallEnd, Clebert Wenger, MD;  Location: [facility] ORS;  Service: Cardiovascular;  Laterality: N/A;     Current Meds  Medication Sig  . apixaban (ELIQUIS) 5 MG TABS tablet Take 1 tablet (5 mg total) by mouth 2 (two) times daily.  . ARIPiprazole (ABILIFY) 2 MG tablet Take 2 mg by mouth daily.   Marland Kitchen. atorvastatin (LIPITOR) 40 MG tablet Take 1 tablet (40 mg total) by mouth daily at 6 PM.  . levothyroxine (SYNTHROID, LEVOTHROID) 100  MCG tablet Take 1 tablet (100 mcg total) by mouth daily.  . metoprolol tartrate (LOPRESSOR) 25 MG tablet Take 1 tablet (25 mg total) by mouth 2 (two) times daily.  . potassium chloride (K-DUR) 10 MEQ tablet Take 1 tablet (10 mEq total) by mouth daily.  . sertraline (ZOLOFT) 100 MG tablet Take 100 mg by mouth daily.     Allergies:   Patient has no known allergies.   Social History   Tobacco Use  . Smoking status: Current Every Day Smoker    Packs/day: 0.25    Types: Cigarettes  . Smokeless tobacco: Former NeurosurgeonUser    Types: Snuff  . Tobacco comment: trying to quit; going to start using patches;   Substance Use Topics  . Alcohol use: Not Currently    Alcohol/week: 0.0 standard drinks    Comment: on occasion  . Drug use: Not Currently    Types: Marijuana    Comment: Previously, but stopped years ago     Family Hx: The patient's family history includes Stroke in her father.  ROS:   Please see the history of present illness.   All other systems reviewed and are negative.   Prior CV studies:   The following studies were reviewed today:  EP Procedures and Devices:  30-day event monitor (09/16/16): Predominantly sinus rhythm with isolated PACs and PVCs. 2 narrow complex tachycardia episodes concerning for atrial fibrillation +/- atrial flutter or SVT. No prolonged pauses.  Non-Invasive Evaluation(s):  Transcranial Doppler (10/05/16): Small right-to-left shunt with Valsalva.  TEE (09/08/16): Normal LV size and wall thickness. Normal systolic function. Trivial aortic regurgitation. Mild MR. No left atrial thrombus. No right atrial thrombus. Atrial septum with question of small right to left shunt, with appearance of small number of bubbles in the left ventricle after injection of agitated saline.  TTE (09/07/16): Normal LV size and wall thickness. LVEF 60-65% with normal wall motion. Grade 1 diastolic dysfunction. Normal RV size and function. No significant valvular   Labs/Other Tests and  Data Reviewed:    EKG:  No ECG reviewed.  Recent Labs: 06/17/2018: ALT 18; BUN 6; Creatinine, Ser 0.66; Hemoglobin 12.7; Platelets 366; Potassium 3.3; Sodium 141; TSH 5.445   Recent Lipid Panel Lab Results  Component Value Date/Time   CHOL 135 02/24/2018 11:30 AM   CHOL 140 09/04/2013 04:05 AM   TRIG 161 (H) 02/24/2018 11:30 AM   TRIG 137 09/04/2013 04:05 AM   HDL 37 (L) 02/24/2018 11:30 AM   HDL 30 (L) 09/04/2013 04:05 AM   CHOLHDL 3.6 02/24/2018 11:30 AM   CHOLHDL 5.1 09/07/2016 04:42 AM   LDLCALC 66 02/24/2018 11:30 AM   LDLCALC 83 09/04/2013 04:05 AM    Wt Readings from Last 3 Encounters:  08/15/18 215 lb (97.5 kg)  06/21/18 231 lb (104.8 kg)  04/19/18 223 lb (101.2 kg)     Objective:    Vital Signs:  Ht 5' 5.5" (1.664 m)  Wt 215 lb (97.5 kg)   BMI 35.23 kg/m    VITAL SIGNS:  reviewed GEN:  no acute distress  ASSESSMENT & PLAN:    Paroxysmal atrial fibrillation: No palpitations or other symptoms to suggest recurrence.  Ms. Kosiba also has not had any new focal neurologic deficits to suggest recurrent stroke.  She is tolerating apixaban well, which should be continued indefinitely.  Dyspnea on exertion: I suspect this is most likely due to weight gain and inactivity.  The patient does not report other signs or symptoms of heart failure.  However, if exertional dyspnea worsens or new concerns arise, will need to see her in the office for clinical evaluation and consideration of additional testing.  PFO: Incidentally noted during work-up for stroke.  Given relatively small right to left shunt and plans for indefinite anticoagulation in the setting of paroxysmal atrial fibrillation, I do not think that PFO closure is indicated at this time.  Tobacco use:  Smoking cessation was encouraged.  COVID-19 Education: The signs and symptoms of COVID-19 were discussed with the patient and how to seek care for testing (follow up with PCP or arrange E-visit).  The importance  of social distancing was discussed today.  Time:   Today, I have spent 10 minutes with the patient with telehealth technology discussing the above problems.     Medication Adjustments/Labs and Tests Ordered: Current medicines are reviewed at length with the patient today.  Concerns regarding medicines are outlined above.   Tests Ordered: None.  Medication Changes: None.  Disposition:  Follow up in 4 month(s)  Signed, [name], MD  08/15/2018 11:31 AM    [facility]

## 2018-08-15 ENCOUNTER — Encounter: Payer: Self-pay | Admitting: Internal Medicine

## 2018-08-15 ENCOUNTER — Other Ambulatory Visit: Payer: Self-pay

## 2018-08-15 ENCOUNTER — Telehealth (INDEPENDENT_AMBULATORY_CARE_PROVIDER_SITE_OTHER): Payer: Medicaid Other | Admitting: Internal Medicine

## 2018-08-15 VITALS — Ht 65.5 in | Wt 215.0 lb

## 2018-08-15 DIAGNOSIS — Q2112 Patent foramen ovale: Secondary | ICD-10-CM

## 2018-08-15 DIAGNOSIS — R0609 Other forms of dyspnea: Secondary | ICD-10-CM | POA: Diagnosis not present

## 2018-08-15 DIAGNOSIS — Z720 Tobacco use: Secondary | ICD-10-CM | POA: Diagnosis not present

## 2018-08-15 DIAGNOSIS — Q211 Atrial septal defect: Secondary | ICD-10-CM | POA: Diagnosis not present

## 2018-08-15 DIAGNOSIS — Z7189 Other specified counseling: Secondary | ICD-10-CM

## 2018-08-15 DIAGNOSIS — I480 Paroxysmal atrial fibrillation: Secondary | ICD-10-CM

## 2018-08-15 NOTE — Patient Instructions (Signed)
Medication Instructions:  Your physician recommends that you continue on your current medications as directed. Please refer to the Current Medication list given to you today.  If you need a refill on your cardiac medications before your next appointment, please call your pharmacy.   Lab work: none If you have labs (blood work) drawn today and your tests are completely normal, you will receive your results only by: Marland Kitchen. MyChart Message (if you have MyChart) OR . A paper copy in the mail If you have any lab test that is abnormal or we need to change your treatment, we will call you to review the results.  Testing/Procedures: none  Follow-Up: [facility], you and your health needs are our priority.  As part of our continuing mission to provide you with exceptional heart care, we have created designated Provider Care Teams.  These Care Teams include your primary Cardiologist (physician) and Advanced Practice Providers (APPs -  Physician Assistants and Nurse Practitioners) who all work together to provide you with the care you need, when you need it. You will need a follow up appointment in 4 months.  Please call our office 2 months in advance to schedule this appointment.  You may see Yvonne Kendallhristopher End, MD or one of the following Advanced Practice Providers on your designated Care Team:   Nicolasa Duckinghristopher Berge, NP Eula Listenyan Dunn, PA-C . Marisue IvanJacquelyn Visser, PA-C  Any Other Special Instructions Will Be Listed Below (If Applicable).  Dr End recommends you try to lose weight. Below are some helpful tips and suggestions.     Exercising to Lose Weight Exercise is structured, repetitive physical activity to improve fitness and health. Getting regular exercise is important for everyone. It is especially important if you are overweight. Being overweight increases your risk of heart disease, stroke, diabetes, high blood pressure, and several types of cancer. Reducing your calorie intake and exercising can help  you lose weight. Exercise is usually categorized as moderate or vigorous intensity. To lose weight, most people need to do a certain amount of moderate-intensity or vigorous-intensity exercise each week. Moderate-intensity exercise  Moderate-intensity exercise is any activity that gets you moving enough to burn at least three times more energy (calories) than if you were sitting. Examples of moderate exercise include:  Walking a mile in 15 minutes.  Doing light yard work.  Biking at an easy pace. Most people should get at least 150 minutes (2 hours and 30 minutes) a week of moderate-intensity exercise to maintain their body weight. Vigorous-intensity exercise Vigorous-intensity exercise is any activity that gets you moving enough to burn at least six times more calories than if you were sitting. When you exercise at this intensity, you should be working hard enough that you are not able to carry on a conversation. Examples of vigorous exercise include:  Running.  Playing a team sport, such as football, basketball, and soccer.  Jumping rope. Most people should get at least 75 minutes (1 hour and 15 minutes) a week of vigorous-intensity exercise to maintain their body weight. How can exercise affect me? When you exercise enough to burn more calories than you eat, you lose weight. Exercise also reduces body fat and builds muscle. The more muscle you have, the more calories you burn. Exercise also:  Improves mood.  Reduces stress and tension.  Improves your overall fitness, flexibility, and endurance.  Increases bone strength. The amount of exercise you need to lose weight depends on:  Your age.  The type of exercise.  Any  health conditions you have.  Your overall physical ability. Talk to your health care provider about how much exercise you need and what types of activities are safe for you. What actions can I take to lose weight? Nutrition   Make changes to your diet as  told by your health care provider or diet and nutrition specialist (dietitian). This may include: ? Eating fewer calories. ? Eating more protein. ? Eating less unhealthy fats. ? Eating a diet that includes fresh fruits and vegetables, whole grains, low-fat dairy products, and lean protein. ? Avoiding foods with added fat, salt, and sugar.  Drink plenty of water while you exercise to prevent dehydration or heat stroke. Activity  Choose an activity that you enjoy and set realistic goals. Your health care provider can help you make an exercise plan that works for you.  Exercise at a moderate or vigorous intensity most days of the week. ? The intensity of exercise may vary from person to person. You can tell how intense a workout is for you by paying attention to your breathing and heartbeat. Most people will notice their breathing and heartbeat get faster with more intense exercise.  Do resistance training twice each week, such as: ? Push-ups. ? Sit-ups. ? Lifting weights. ? Using resistance bands.  Getting short amounts of exercise can be just as helpful as long structured periods of exercise. If you have trouble finding time to exercise, try to include exercise in your daily routine. ? Get up, stretch, and walk around every 30 minutes throughout the day. ? Go for a walk during your lunch break. ? Park your car farther away from your destination. ? If you take public transportation, get off one stop early and walk the rest of the way. ? Make phone calls while standing up and walking around. ? Take the stairs instead of elevators or escalators.  Wear comfortable clothes and shoes with good support.  Do not exercise so much that you hurt yourself, feel dizzy, or get very short of breath. Where to find more information  U.S. Department of Health and Human Services: ThisPath.fi  Centers for Disease Control and Prevention (CDC): FootballExhibition.com.br Contact a health care provider:  Before  starting a new exercise program.  If you have questions or concerns about your weight.  If you have a medical problem that keeps you from exercising. Get help right away if you have any of the following while exercising:  Injury.  Dizziness.  Difficulty breathing or shortness of breath that does not go away when you stop exercising.  Chest pain.  Rapid heartbeat. Summary  Being overweight increases your risk of heart disease, stroke, diabetes, high blood pressure, and several types of cancer.  Losing weight happens when you burn more calories than you eat.  Reducing the amount of calories you eat in addition to getting regular moderate or vigorous exercise each week helps you lose weight. This information is not intended to replace advice given to you by your health care provider. Make sure you discuss any questions you have with your health care provider. Document Released: 04/25/2010 Document Revised: 04/05/2017 Document Reviewed: 04/05/2017 Elsevier Interactive Patient Education  2019 [employer].

## 2018-08-26 ENCOUNTER — Ambulatory Visit: Payer: Medicaid Other | Admitting: Internal Medicine

## 2018-10-03 ENCOUNTER — Other Ambulatory Visit: Payer: Self-pay

## 2018-10-03 ENCOUNTER — Inpatient Hospital Stay: Admission: RE | Admit: 2018-10-03 | Payer: Medicaid Other | Source: Ambulatory Visit

## 2018-10-03 MED ORDER — POTASSIUM CHLORIDE ER 10 MEQ PO TBCR: 10.0000 meq | EXTENDED_RELEASE_TABLET | Freq: Every day | ORAL | 1 refills | Status: DC

## 2018-10-11 ENCOUNTER — Inpatient Hospital Stay
Admission: RE | Admit: 2018-10-11 | Discharge: 2018-10-11 | Disposition: A | Discharge status: Home / Self Care | Payer: Medicaid Other | Source: Ambulatory Visit

## 2018-10-11 NOTE — Pre-Procedure Instructions (Signed)
PATIENT NO SHOW FOR PAT APPT. PATIENT UNABLE TO DO PHONE INTERVIEW AT THIS TIME. STATED SHE MAY NOT DO SURGERY OM 7/216. INSTRUCTED TO CALL DR [facility] OFFICE. STATES I WILL HAVE IT. NOTIFIED CASEY AND SHE IS TO CALL BACK RE R/S PAT APPT OR NOT. PATIENT AND CASEY AWARE SHE WILL HAVE TO HAVE LABS/EKG PREOP ALSO

## 2018-10-17 ENCOUNTER — Other Ambulatory Visit
Admission: RE | Admit: 2018-10-17 | Discharge: 2018-10-17 | Disposition: A | Discharge status: Home / Self Care | Payer: Medicaid Other | Source: Ambulatory Visit | Attending: Orthopedic Surgery | Admitting: Orthopedic Surgery

## 2018-10-20 ENCOUNTER — Ambulatory Visit: Admission: RE | Admit: 2018-10-20 | Payer: Medicaid Other | Source: Home / Self Care | Admitting: Orthopedic Surgery

## 2018-10-20 ENCOUNTER — Encounter: Admission: RE | Payer: Self-pay | Source: Home / Self Care

## 2018-10-20 SURGERY — ARTHROSCOPY, KNEE, WITH LATERAL MENISCECTOMY
Anesthesia: Choice | Laterality: Left

## 2018-11-02 ENCOUNTER — Other Ambulatory Visit: Payer: Self-pay | Admitting: Cardiovascular Disease

## 2018-11-02 NOTE — Telephone Encounter (Signed)
Please review for refill.  

## 2018-11-02 NOTE — Telephone Encounter (Signed)
Pt's age 48, wt 97.5 kg, SCr 0.66, CrCl 164.94, last ov w/ CE 08/15/18.

## 2018-11-08 ENCOUNTER — Telehealth: Payer: Self-pay

## 2018-11-08 NOTE — Telephone Encounter (Signed)
Patient calling that she is feeling a little lightheaded and body ache (neck). She is also complaining about a sore throat. She is also SOB. Patient with history of asthma. She said that she only needs a muscle relaxer, inhaler, antibiotic. CB#[phone]. Advised patient that she needs a telephone or video visit with provider and that if she feels that symptoms are worsening that she needs to go the ED.

## 2018-11-08 NOTE — Telephone Encounter (Signed)
Needs visit

## 2018-11-08 NOTE — Telephone Encounter (Signed)
Patient got mad because she wants to be seen in person. "I cannot believe that having a Primary doctor I need to go to an urgent care, because my primary Dr. Delano Metz want to see in person she wants to do a virtual visit." She also said that she didn't go to work today and that she needs a doctors note, told her that urgent care can give her the note. Tried to explained patient why we are not seeing patient's in person with this symptoms and patient interrupted and said I'm just going to call urgent care and go there and hung up.

## 2018-11-10 ENCOUNTER — Ambulatory Visit: Payer: Medicaid Other | Admitting: Physician Assistant

## 2018-11-10 ENCOUNTER — Ambulatory Visit (INDEPENDENT_AMBULATORY_CARE_PROVIDER_SITE_OTHER): Payer: Medicaid Other | Admitting: Physician Assistant

## 2018-11-10 ENCOUNTER — Other Ambulatory Visit: Payer: Self-pay

## 2018-11-10 DIAGNOSIS — R21 Rash and other nonspecific skin eruption: Secondary | ICD-10-CM | POA: Diagnosis not present

## 2018-11-10 DIAGNOSIS — M542 Cervicalgia: Secondary | ICD-10-CM

## 2018-11-10 DIAGNOSIS — R29818 Other symptoms and signs involving the nervous system: Secondary | ICD-10-CM

## 2018-11-10 MED ORDER — CYCLOBENZAPRINE HCL 5 MG PO TABS: 5.0000 mg | ORAL_TABLET | Freq: Every day | ORAL | 0 refills | Status: DC

## 2018-11-10 MED ORDER — VALACYCLOVIR HCL 1 G PO TABS: ORAL_TABLET | ORAL | 0 refills | Status: DC

## 2018-11-10 MED ORDER — VALACYCLOVIR HCL 1 G PO TABS: 1000.0000 mg | ORAL_TABLET | Freq: Two times a day (BID) | ORAL | 0 refills | Status: DC
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
## 2018-11-21 ENCOUNTER — Encounter: Payer: Self-pay | Admitting: Physician Assistant

## 2018-11-21 ENCOUNTER — Telehealth: Payer: Self-pay | Admitting: Physician Assistant

## 2018-11-21 DIAGNOSIS — F331 Major depressive disorder, recurrent, moderate: Secondary | ICD-10-CM

## 2018-11-21 MED ORDER — SERTRALINE HCL 100 MG PO TABS: 100.0000 mg | ORAL_TABLET | Freq: Every day | ORAL | 1 refills | Status: DC

## 2018-11-21 NOTE — Telephone Encounter (Signed)
pt needs refill on   Sertraline 100 mg  [facility]  CB#  [phone]  [place]

## 2018-11-21 NOTE — Telephone Encounter (Signed)
Please review

## 2018-11-21 NOTE — Telephone Encounter (Signed)
Filled

## 2018-12-30 ENCOUNTER — Ambulatory Visit: Payer: Medicaid Other | Admitting: Podiatry

## 2019-01-03 ENCOUNTER — Other Ambulatory Visit: Payer: Self-pay | Admitting: Physician Assistant

## 2019-01-03 NOTE — Telephone Encounter (Signed)
[facility] faxed refill request for the following medications:  levothyroxine (SYNTHROID, LEVOTHROID) 100 MCG tablet    Please advise. 

## 2019-01-04 MED ORDER — LEVOTHYROXINE SODIUM 100 MCG PO TABS: 100.0000 ug | ORAL_TABLET | Freq: Every day | ORAL | 3 refills | Status: DC

## 2019-01-04 NOTE — Telephone Encounter (Signed)
Called [facility]

## 2019-01-04 NOTE — Telephone Encounter (Signed)
Can we change to 90 days with no refills please.

## 2019-01-11 ENCOUNTER — Other Ambulatory Visit: Payer: Self-pay | Admitting: Internal Medicine

## 2019-01-11 ENCOUNTER — Other Ambulatory Visit: Payer: Self-pay | Admitting: Physician Assistant

## 2019-01-18 ENCOUNTER — Ambulatory Visit: Payer: Medicaid Other | Admitting: Internal Medicine

## 2019-01-18 NOTE — Progress Notes (Deleted)
Follow-up Outpatient Visit Date: 01/18/2019  Primary Care Provider: Trey Sailors, PA-C [address] [place] [address]  Chief Complaint: ***  HPI:  Ms. Pridgen is a 48 y.o. year-old female with history of paroxysmal atrial fibrillation complicated by stroke, PFO, hypothyroidism, asthma, and remote history of polysubstance abuse, who presents for follow-up of shortness of breath and fatigue.  We last spoke in May via video.  At that time, she reported a 1 month history of dyspnea on exertion.  Symptoms were felt to be due to inactivity and associated weight gain.  No further testing/intervention was recommended at that time.  --------------------------------------------------------------------------------------------------  Cardiovascular History & Procedures: Cardiovascular Problems:  Stroke  Paroxysmal atrial fibrillation  Small PFO  Risk Factors:  Stroke  Cath/PCI:  None  CV Surgery:  None  EP Procedures and Devices:  30-day event monitor (09/16/16): Predominantly sinus rhythm with isolated PACs and PVCs. 2 narrow complex tachycardia episodes concerning for atrial fibrillation +/- atrial flutter or SVT. No prolonged pauses.  Non-Invasive Evaluation(s):  Transcranial Doppler (10/05/16): Small right-to-left shunt with Valsalva.  TEE (09/08/16): Normal LV size and wall thickness. Normal systolic function. Trivial aortic regurgitation. Mild MR. No left atrial thrombus. No right atrial thrombus. Atrial septum with question of small right to left shunt, with appearance of small number of bubbles in the left ventricle after injection of agitated saline.  TTE (09/07/16): Normal LV size and wall thickness. LVEF 60-65% with normal wall motion. Grade 1 diastolic dysfunction. Normal RV size and function. No significant valvular abnormalities.  Recent CV Pertinent Labs: Lab Results  Component Value Date   CHOL 135 02/24/2018   CHOL 140 09/04/2013   HDL 37  (L) 02/24/2018   HDL 30 (L) 09/04/2013   LDLCALC 66 02/24/2018   LDLCALC 83 09/04/2013   TRIG 161 (H) 02/24/2018   TRIG 137 09/04/2013   CHOLHDL 3.6 02/24/2018   CHOLHDL 5.1 09/07/2016   INR 1.13 09/06/2016   INR 1.2 09/04/2013   K 3.3 (L) 06/17/2018   K 3.8 09/04/2013   MG 1.8 09/02/2013   BUN 6 06/17/2018   BUN 10 02/24/2018   BUN 11 09/04/2013   CREATININE 0.66 06/17/2018   CREATININE 0.81 09/04/2013    Past medical and surgical history were reviewed and updated in EPIC.  No outpatient medications have been marked as taking for the 01/18/19 encounter (Appointment) with Philbert Ocallaghan, Cristal Deer, MD.    Allergies: Patient has no known allergies.  Social History   Tobacco Use  . Smoking status: Current Every Day Smoker    Packs/day: 0.25    Types: Cigarettes  . Smokeless tobacco: Former Neurosurgeon    Types: Snuff  . Tobacco comment: trying to quit; going to start using patches;   Substance Use Topics  . Alcohol use: Not Currently    Alcohol/week: 0.0 standard drinks    Comment: on occasion  . Drug use: Not Currently    Types: Marijuana    Comment: Previously, but stopped years ago    Family History  Problem Relation Age of Onset  . Stroke Father     Review of Systems: A 12-system review of systems was performed and was negative except as noted in the HPI.  --------------------------------------------------------------------------------------------------  Physical Exam: There were no vitals taken for this visit.  General:  *** HEENT: No conjunctival pallor or scleral icterus. Moist mucous membranes.  OP clear. Neck: Supple without lymphadenopathy, thyromegaly, JVD, or HJR. No carotid bruit. Lungs: Normal work of breathing.  Clear to auscultation bilaterally without wheezes or crackles. Heart: Regular rate and rhythm without murmurs, rubs, or gallops. Non-displaced PMI. Abd: Bowel sounds present. Soft, NT/ND without hepatosplenomegaly Ext: No lower extremity edema.  Radial, PT, and DP pulses are 2+ bilaterally. Skin: Warm and dry without rash.  EKG:  ***  Lab Results  Component Value Date   WBC 15.2 (H) 06/17/2018   HGB 12.7 06/17/2018   HCT 39.4 06/17/2018   MCV 85.8 06/17/2018   PLT 366 06/17/2018    Lab Results  Component Value Date   NA 141 06/17/2018   K 3.3 (L) 06/17/2018   CL 106 06/17/2018   CO2 27 06/17/2018   BUN 6 06/17/2018   CREATININE 0.66 06/17/2018   GLUCOSE 106 (H) 06/17/2018   ALT 18 06/17/2018    Lab Results  Component Value Date   CHOL 135 02/24/2018   HDL 37 (L) 02/24/2018   LDLCALC 66 02/24/2018   TRIG 161 (H) 02/24/2018   CHOLHDL 3.6 02/24/2018    --------------------------------------------------------------------------------------------------  ASSESSMENT AND PLAN: Harrell Gave Kiffany Schelling, MD 01/18/2019 1:34 PM

## 2019-01-25 NOTE — Progress Notes (Deleted)
Follow-up Outpatient Visit Date: 01/27/2019  Primary Care Provider: Trey Sailors, PA-C [address] [place] [address] [place] [address]  Chief Complaint: ***  HPI:  Ms. Grealish is a 48 y.o. year-old female with history of paroxysmal atrial fibrillation complicated by stroke, PFO, hypothyroidism, asthma, and remote history of polysubstance abuse, who presents for follow-up of shortness of breath and fatigue.  We last spoke in May via video.  At that time, she reported a 1 month history of dyspnea on exertion.  Symptoms were felt to be due to inactivity and associated weight gain.  No further testing/intervention was recommended at that time.  --------------------------------------------------------------------------------------------------  Cardiovascular History & Procedures: Cardiovascular Problems:  Stroke  Paroxysmal atrial fibrillation  Small PFO  Risk Factors:  Stroke  Cath/PCI:  None  CV Surgery:  None  EP Procedures and Devices:  30-day event monitor (09/16/16): Predominantly sinus rhythm with isolated PACs and PVCs. 2 narrow complex tachycardia episodes concerning for atrial fibrillation +/- atrial flutter or SVT. No prolonged pauses.  Non-Invasive Evaluation(s):  Transcranial Doppler (10/05/16): Small right-to-left shunt with Valsalva.  TEE (09/08/16): Normal LV size and wall thickness. Normal systolic function. Trivial aortic regurgitation. Mild MR. No left atrial thrombus. No right atrial thrombus. Atrial septum with question of small right to left shunt, with appearance of small number of bubbles in the left ventricle after injection of agitated saline.  TTE (09/07/16): Normal LV size and wall thickness. LVEF 60-65% with normal wall motion. Grade 1 diastolic dysfunction. Normal RV size and function. No significant valvular abnormalities.  Recent CV Pertinent Labs: Lab Results  Component Value Date   CHOL 135 02/24/2018   CHOL 140 09/04/2013   HDL 37  (L) 02/24/2018   HDL 30 (L) 09/04/2013   LDLCALC 66 02/24/2018   LDLCALC 83 09/04/2013   TRIG 161 (H) 02/24/2018   TRIG 137 09/04/2013   CHOLHDL 3.6 02/24/2018   CHOLHDL 5.1 09/07/2016   INR 1.13 09/06/2016   INR 1.2 09/04/2013   K 3.3 (L) 06/17/2018   K 3.8 09/04/2013   MG 1.8 09/02/2013   BUN 6 06/17/2018   BUN 10 02/24/2018   BUN 11 09/04/2013   CREATININE 0.66 06/17/2018   CREATININE 0.81 09/04/2013    Past medical and surgical history were reviewed and updated in EPIC.  No outpatient medications have been marked as taking for the 01/27/19 encounter (Appointment) with Essence Merle, Cristal Deer, MD.    Allergies: Patient has no known allergies.  Social History   Tobacco Use  . Smoking status: Current Every Day Smoker    Packs/day: 0.25    Types: Cigarettes  . Smokeless tobacco: Former Neurosurgeon    Types: Snuff  . Tobacco comment: trying to quit; going to start using patches;   Substance Use Topics  . Alcohol use: Not Currently    Alcohol/week: 0.0 standard drinks    Comment: on occasion  . Drug use: Not Currently    Types: Marijuana    Comment: Previously, but stopped years ago    Family History  Problem Relation Age of Onset  . Stroke Father     Review of Systems: A 12-system review of systems was performed and was negative except as noted in the HPI.  --------------------------------------------------------------------------------------------------  Physical Exam: There were no vitals taken for this visit.  General:  *** HEENT: No conjunctival pallor or scleral icterus. Moist mucous membranes.  OP clear. Neck: Supple without lymphadenopathy, thyromegaly, JVD, or HJR. No carotid bruit. Lungs: Normal work of breathing.  Clear to auscultation bilaterally without wheezes or crackles. Heart: Regular rate and rhythm without murmurs, rubs, or gallops. Non-displaced PMI. Abd: Bowel sounds present. Soft, NT/ND without hepatosplenomegaly Ext: No lower extremity edema.  Radial, PT, and DP pulses are 2+ bilaterally. Skin: Warm and dry without rash.  EKG:  ***  Lab Results  Component Value Date   WBC 15.2 (H) 06/17/2018   HGB 12.7 06/17/2018   HCT 39.4 06/17/2018   MCV 85.8 06/17/2018   PLT 366 06/17/2018    Lab Results  Component Value Date   NA 141 06/17/2018   K 3.3 (L) 06/17/2018   CL 106 06/17/2018   CO2 27 06/17/2018   BUN 6 06/17/2018   CREATININE 0.66 06/17/2018   GLUCOSE 106 (H) 06/17/2018   ALT 18 06/17/2018    Lab Results  Component Value Date   CHOL 135 02/24/2018   HDL 37 (L) 02/24/2018   LDLCALC 66 02/24/2018   TRIG 161 (H) 02/24/2018   CHOLHDL 3.6 02/24/2018    --------------------------------------------------------------------------------------------------  ASSESSMENT AND PLAN: Harrell Gave Tyshana Nishida, MD 01/25/2019 7:51 AM

## 2019-01-27 ENCOUNTER — Ambulatory Visit: Payer: Medicaid Other | Admitting: Internal Medicine

## 2019-02-02 ENCOUNTER — Ambulatory Visit: Payer: Medicaid Other | Admitting: Internal Medicine

## 2019-02-02 NOTE — Progress Notes (Deleted)
Follow-up Outpatient Visit Date: 02/02/2019  Primary Care Provider: Trey Sailors, PA-C [address] [place] [address] [place] [address]  Chief Complaint: ***  HPI:  Margaret [name] is a 48 y.o. year-old female with history of paroxysmal atrial fibrillation complicated by stroke, PFO, hypothyroidism, asthma, and remote history of polysubstance abuse, who presents for follow-up of shortness of breath and fatigue.  We last spoke in May via video.  At that time, she reported a 1 month history of dyspnea on exertion.  Symptoms were felt to be due to inactivity and associated weight gain.  No further testing/intervention was recommended at that time.  --------------------------------------------------------------------------------------------------  Cardiovascular History & Procedures: Cardiovascular Problems:  Stroke  Paroxysmal atrial fibrillation  Small PFO  Risk Factors:  Stroke  Cath/PCI:  None  CV Surgery:  None  EP Procedures and Devices:  30-day event monitor (09/16/16): Predominantly sinus rhythm with isolated PACs and PVCs. 2 narrow complex tachycardia episodes concerning for atrial fibrillation +/- atrial flutter or SVT. No prolonged pauses.  Non-Invasive Evaluation(s):  Transcranial Doppler (10/05/16): Small right-to-left shunt with Valsalva.  TEE (09/08/16): Normal LV size and wall thickness. Normal systolic function. Trivial aortic regurgitation. Mild MR. No left atrial thrombus. No right atrial thrombus. Atrial septum with question of small right to left shunt, with appearance of small number of bubbles in the left ventricle after injection of agitated saline.  TTE (09/07/16): Normal LV size and wall thickness. LVEF 60-65% with normal wall motion. Grade 1 diastolic dysfunction. Normal RV size and function. No significant valvular abnormalities.  Recent CV Pertinent Labs: Lab Results  Component Value Date   CHOL 135 02/24/2018   CHOL 140 09/04/2013   HDL 37  (L) 02/24/2018   HDL 30 (L) 09/04/2013   LDLCALC 66 02/24/2018   LDLCALC 83 09/04/2013   TRIG 161 (H) 02/24/2018   TRIG 137 09/04/2013   CHOLHDL 3.6 02/24/2018   CHOLHDL 5.1 09/07/2016   INR 1.13 09/06/2016   INR 1.2 09/04/2013   K 3.3 (L) 06/17/2018   K 3.8 09/04/2013   MG 1.8 09/02/2013   BUN 6 06/17/2018   BUN 10 02/24/2018   BUN 11 09/04/2013   CREATININE 0.66 06/17/2018   CREATININE 0.81 09/04/2013    Past medical and surgical history were reviewed and updated in EPIC.  No outpatient medications have been marked as taking for the 02/02/19 encounter (Appointment) with Fiore Detjen, Cristal Deer, MD.    Allergies: Patient has no known allergies.  Social History   Tobacco Use  . Smoking status: Current Every Day Smoker    Packs/day: 0.25    Types: Cigarettes  . Smokeless tobacco: Former Neurosurgeon    Types: Snuff  . Tobacco comment: trying to quit; going to start using patches;   Substance Use Topics  . Alcohol use: Not Currently    Alcohol/week: 0.0 standard drinks    Comment: on occasion  . Drug use: Not Currently    Types: Marijuana    Comment: Previously, but stopped years ago    Family History  Problem Relation Age of Onset  . Stroke Father     Review of Systems: A 12-system review of systems was performed and was negative except as noted in the HPI.  --------------------------------------------------------------------------------------------------  Physical Exam: There were no vitals taken for this visit.  General:  *** HEENT: No conjunctival pallor or scleral icterus. Moist mucous membranes.  OP clear. Neck: Supple without lymphadenopathy, thyromegaly, JVD, or HJR. No carotid bruit. Lungs: Normal work of breathing.  Clear to auscultation bilaterally without wheezes or crackles. Heart: Regular rate and rhythm without murmurs, rubs, or gallops. Non-displaced PMI. Abd: Bowel sounds present. Soft, NT/ND without hepatosplenomegaly Ext: No lower extremity edema.  Radial, PT, and DP pulses are 2+ bilaterally. Skin: Warm and dry without rash.  EKG:  ***  Lab Results  Component Value Date   WBC 15.2 (H) 06/17/2018   HGB 12.7 06/17/2018   HCT 39.4 06/17/2018   MCV 85.8 06/17/2018   PLT 366 06/17/2018    Lab Results  Component Value Date   NA 141 06/17/2018   K 3.3 (L) 06/17/2018   CL 106 06/17/2018   CO2 27 06/17/2018   BUN 6 06/17/2018   CREATININE 0.66 06/17/2018   GLUCOSE 106 (H) 06/17/2018   ALT 18 06/17/2018    Lab Results  Component Value Date   CHOL 135 02/24/2018   HDL 37 (L) 02/24/2018   LDLCALC 66 02/24/2018   TRIG 161 (H) 02/24/2018   CHOLHDL 3.6 02/24/2018    --------------------------------------------------------------------------------------------------  ASSESSMENT AND PLAN: Margaret Gave Averianna Brugger, MD 02/02/2019 7:19 AM

## 2019-02-15 ENCOUNTER — Ambulatory Visit: Payer: Medicaid Other | Admitting: Internal Medicine

## 2019-02-16 ENCOUNTER — Other Ambulatory Visit: Payer: Self-pay | Admitting: Physician Assistant

## 2019-02-16 ENCOUNTER — Other Ambulatory Visit: Payer: Self-pay | Admitting: *Deleted

## 2019-02-16 MED ORDER — ATORVASTATIN CALCIUM 40 MG PO TABS: ORAL_TABLET | ORAL | 1 refills | Status: DC

## 2019-02-17 NOTE — Progress Notes (Deleted)
Cardiology Office Note    Date:  02/17/2019   ID:  Margaret [name], DOB Dec 14, 1970, MRN [id number]  PCP:  Trinna Post, PA-C  Cardiologist:  Nelva Bush, MD  Electrophysiologist:  None   Chief Complaint: Follow-up  History of Present Illness:   Margaret [name] is a 48 y.o. female with history of PAF on Eliquis complicated by stroke, PFO, hypothyroidism, asthma, depression, and remote history of polysubstance abuse with ongoing tobacco abuse who presents for follow-up of her A. Fib.  Patient was admitted to the hospital in 09/2016 in the setting of slurred speech with right frontal lobe stroke.  Echo at that time showed normal LV systolic function with grade 1 diastolic dysfunction.  Carotid artery ultrasound showed less than 50% bilateral ICA stenoses.  TEE showed a very small atrial level right to left shunt consistent with PFO.  Outpatient cardiac monitoring showed 2 episodes of narrow complex tachycardia concerning for A. fib/flutter.  In that setting, she was placed on Xarelto and beta-blocker.  She was subsequently transitioned from Xarelto to Eliquis.  She was most recently seen virtually in 08/2018 noting shortness of breath with activity over the prior month.  It was noted she had put on excess weight over the past several months in the setting of relatively sedentary lifestyle.  She continued to feel like she was depressed.  She denied any palpitations or symptoms concerning for recurrence of A. fib.  She was tolerating Eliquis without issues.  Her dyspnea was felt to be most likely related to weight gain and sedentary lifestyle.  She did not have any symptoms concerning for heart failure.  ***   Labs: 06/2018 - TSH 5.445, Hgb 12.7, PLT 366, potassium 3.3, BUN 6, serum creatinine 0.66, albumin 3.8, AST/ALT normal 02/2018 - total cholesterol 135, triglycerides 161, HDL 37, LDL 66  Past Medical History:  Diagnosis Date  . Anxiety   . Asthma    controlled  . CVA (cerebral  vascular accident) ([place])    a. 09/2016  . Depression   . Dysrhythmia   . Heart murmur   . Hypertension   . Hypothyroidism   . PAF (paroxysmal atrial fibrillation) ([place]) 09/2016   a. 30-day event monitor 6/18: predominant rhythm of sinus with isolated PACs and PVCs.  Two narrow complex tachycardia episodes concerning for A. Fib +/-atrial flutter or SVT.  No prolonged pauses; b. CHADS2VASc = 3 (stroke x 2, female)--> Eliquis.  Marland Kitchen PFO (patent foramen ovale)    a. TTE 6/18: EF of 60 to 65%, normal wall motion, grade 1 diastolic dysfunction; b. TEE 6/18: evidence of a very small atrial level right to left shunt by bubble study without visualization of ASD or PFO  . Substance abuse ([place])    Past use of cocaine   . Tobacco abuse     Past Surgical History:  Procedure Laterality Date  . FRACTURE SURGERY    . TEE WITHOUT CARDIOVERSION N/A 09/08/2016   Procedure: TRANSESOPHAGEAL ECHOCARDIOGRAM (TEE);  Surgeon: Nelva Bush, MD;  Location: [facility] ORS;  Service: Cardiovascular;  Laterality: N/A;    Current Medications: No outpatient medications have been marked as taking for the 02/21/19 encounter (Appointment) with Rise Mu, PA-C.    Allergies:   Patient has no known allergies.   Social History   Socioeconomic History  . Marital status: Divorced    Spouse name: Not on file  . Number of children: 2  . Years of education: 38  . Highest education  level: Not on file  Occupational History  . Occupation: [employer]  Social Needs  . Financial resource strain: Not on file  . Food insecurity    Worry: Not on file    Inability: Not on file  . Transportation needs    Medical: Not on file    Non-medical: Not on file  Tobacco Use  . Smoking status: Current Every Day Smoker    Packs/day: 0.25    Types: Cigarettes  . Smokeless tobacco: Former Neurosurgeon    Types: Snuff  . Tobacco comment: trying to quit; going to start using patches;   Substance and Sexual Activity  . Alcohol use: Not  Currently    Alcohol/week: 0.0 standard drinks    Comment: on occasion  . Drug use: Not Currently    Types: Marijuana    Comment: Previously, but stopped years ago  . Sexual activity: Yes    Partners: Male    Birth control/protection: Condom  Lifestyle  . Physical activity    Days per week: Not on file    Minutes per session: Not on file  . Stress: Not on file  Relationships  . Social Musician on phone: Not on file    Gets together: Not on file    Attends religious service: Not on file    Active member of club or organization: Not on file    Attends meetings of clubs or organizations: Not on file    Relationship status: Not on file  Other Topics Concern  . Not on file  Social History Narrative   Lives with daughter   Caffeine use: daily   Right handed     Family History:  The patient's family history includes Stroke in her father.  ROS:   ROS   EKGs/Labs/Other Studies Reviewed:    Studies reviewed were summarized above. The additional studies were reviewed today: As above.   EKG:  EKG is ordered today.  The EKG ordered today demonstrates ***  Recent Labs: 06/17/2018: ALT 18; BUN 6; Creatinine, Ser 0.66; Hemoglobin 12.7; Platelets 366; Potassium 3.3; Sodium 141; TSH 5.445  Recent Lipid Panel    Component Value Date/Time   CHOL 135 02/24/2018 1130   CHOL 140 09/04/2013 0405   TRIG 161 (H) 02/24/2018 1130   TRIG 137 09/04/2013 0405   HDL 37 (L) 02/24/2018 1130   HDL 30 (L) 09/04/2013 0405   CHOLHDL 3.6 02/24/2018 1130   CHOLHDL 5.1 09/07/2016 0442   VLDL 11 09/07/2016 0442   VLDL 27 09/04/2013 0405   LDLCALC 66 02/24/2018 1130   LDLCALC 83 09/04/2013 0405    PHYSICAL EXAM:    VS:  There were no vitals taken for this visit.  BMI: There is no height or weight on file to calculate BMI.  Physical Exam  Wt Readings from Last 3 Encounters:  08/15/18 215 lb (97.5 kg)  06/21/18 231 lb (104.8 kg)  04/19/18 223 lb (101.2 kg)     ASSESSMENT &  PLAN:   1. ***  Disposition: F/u with Dr. Okey Dupre or an APP in ***.   Medication Adjustments/Labs and Tests Ordered: Current medicines are reviewed at length with the patient today.  Concerns regarding medicines are outlined above. Medication changes, Labs and Tests ordered today are summarized above and listed in the Patient Instructions accessible in Encounters.   Signed, Eula Listen, PA-C 02/17/2019 3:14 PM     [facility] [address] [place], [place]  [address] [phone] 

## 2019-02-21 ENCOUNTER — Ambulatory Visit: Payer: Medicaid Other | Admitting: Physician Assistant

## 2019-02-23 ENCOUNTER — Telehealth: Payer: Medicaid Other | Admitting: Internal Medicine

## 2019-02-23 ENCOUNTER — Other Ambulatory Visit: Payer: Self-pay

## 2019-02-23 NOTE — Progress Notes (Deleted)
{Choose 1 Note Type (Telehealth Visit or Telephone Visit):[phone]}   Date:  02/23/2019   ID:  Margaret [name], DOB 10/26/1970, MRN [id number]  {Patient [phone]::"Home"} {Provider Location:[phone]::"Home"}  PCP:  Trinna Post, PA-C  Cardiologist:  Nelva Bush, MD *** Electrophysiologist:  None   Evaluation Performed:  {Choose Visit Type:[phone]::"Follow-Up Visit"}  Chief Complaint:  ***  History of Present Illness:    Margaret [name] is a 48 y.o. female with history of paroxysmal atrial fibrillation complicated by stroke, PFO, hypothyroidism, asthma, and remote history of polysubstance abuse, who presents for follow-up of shortness of breath and fatigue.  We last spoke in May via video.  At that time, she reported a 1 month history of dyspnea on exertion.  Symptoms were felt to be due to inactivity and associated weight gain.  No further testing/intervention was recommended at that time.  PFO closure has not been pursued, as the PFO itself appears quite small and the patient is on lifelong anticoagulation in the setting of paroxysmal atrial fibrillation.  The patient {does/does not:200015} have symptoms concerning for COVID-19 infection (fever, chills, cough, or new shortness of breath).    Past Medical History:  Diagnosis Date  . Anxiety   . Asthma    controlled  . CVA (cerebral vascular accident) ([place])    a. 09/2016  . Depression   . Dysrhythmia   . Heart murmur   . Hypertension   . Hypothyroidism   . PAF (paroxysmal atrial fibrillation) ([place]) 09/2016   a. 30-day event monitor 6/18: predominant rhythm of sinus with isolated PACs and PVCs.  Two narrow complex tachycardia episodes concerning for A. Fib +/-atrial flutter or SVT.  No prolonged pauses; b. CHADS2VASc = 3 (stroke x 2, female)--> Eliquis.  Marland Kitchen PFO (patent foramen ovale)    a. TTE 6/18: EF of 60 to 65%, normal wall motion, grade 1 diastolic dysfunction; b. TEE 6/18: evidence of a very small atrial  level right to left shunt by bubble study without visualization of ASD or PFO  . Substance abuse ([place])    Past use of cocaine   . Tobacco abuse    Past Surgical History:  Procedure Laterality Date  . FRACTURE SURGERY    . TEE WITHOUT CARDIOVERSION N/A 09/08/2016   Procedure: TRANSESOPHAGEAL ECHOCARDIOGRAM (TEE);  Surgeon: Nelva Bush, MD;  Location: [facility] ORS;  Service: Cardiovascular;  Laterality: N/A;     No outpatient medications have been marked as taking for the 02/23/19 encounter (Appointment) with Ezmeralda Stefanick, Harrell Gave, MD.     Allergies:   Patient has no known allergies.   Social History   Tobacco Use  . Smoking status: Current Every Day Smoker    Packs/day: 0.25    Types: Cigarettes  . Smokeless tobacco: Former Systems developer    Types: Snuff  . Tobacco comment: trying to quit; going to start using patches;   Substance Use Topics  . Alcohol use: Not Currently    Alcohol/week: 0.0 standard drinks    Comment: on occasion  . Drug use: Not Currently    Types: Marijuana    Comment: Previously, but stopped years ago     Family Hx: The patient's family history includes Stroke in her father.  ROS:   Please see the history of present illness.    *** All other systems reviewed and are negative.   Prior CV studies:   The following studies were reviewed today:  EP Procedures and Devices:  30-day event monitor (09/16/16): Predominantly sinus rhythm with isolated  PACs and PVCs. 2 narrow complex tachycardia episodes concerning for atrial fibrillation +/- atrial flutter or SVT. No prolonged pauses.  Non-Invasive Evaluation(s):  Transcranial Doppler (10/05/16): Small right-to-left shunt with Valsalva.  TEE (09/08/16): Normal LV size and wall thickness. Normal systolic function. Trivial aortic regurgitation. Mild MR. No left atrial thrombus. No right atrial thrombus. Atrial septum with question of small right to left shunt, with appearance of small number of bubbles in the left ventricle  after injection of agitated saline.  TTE (09/07/16): Normal LV size and wall thickness. LVEF 60-65% with normal wall motion. Grade 1 diastolic dysfunction. Normal RV size and function. No significant valvular abnormalities.  Labs/Other Tests and Data Reviewed:    EKG:  {EKG/Telemetry Strips Reviewed:[phone]}  Recent Labs: 06/17/2018: ALT 18; BUN 6; Creatinine, Ser 0.66; Hemoglobin 12.7; Platelets 366; Potassium 3.3; Sodium 141; TSH 5.445   Recent Lipid Panel Lab Results  Component Value Date/Time   CHOL 135 02/24/2018 11:30 AM   CHOL 140 09/04/2013 04:05 AM   TRIG 161 (H) 02/24/2018 11:30 AM   TRIG 137 09/04/2013 04:05 AM   HDL 37 (L) 02/24/2018 11:30 AM   HDL 30 (L) 09/04/2013 04:05 AM   CHOLHDL 3.6 02/24/2018 11:30 AM   CHOLHDL 5.1 09/07/2016 04:42 AM   LDLCALC 66 02/24/2018 11:30 AM   LDLCALC 83 09/04/2013 04:05 AM    Wt Readings from Last 3 Encounters:  08/15/18 215 lb (97.5 kg)  06/21/18 231 lb (104.8 kg)  04/19/18 223 lb (101.2 kg)     Objective:    Vital Signs:  There were no vitals taken for this visit.   {HeartCare Virtual Exam (Optional):[phone]::"VITAL SIGNS:  reviewed"}  ASSESSMENT & PLAN:    1. ***  COVID-19 Education: The signs and symptoms of COVID-19 were discussed with the patient and how to seek care for testing (follow up with PCP or arrange E-visit).  ***The importance of social distancing was discussed today.  Time:   Today, I have spent *** minutes with the patient with telehealth technology discussing the above problems.     Medication Adjustments/Labs and Tests Ordered: Current medicines are reviewed at length with the patient today.  Concerns regarding medicines are outlined above.   Tests Ordered: No orders of the defined types were placed in this encounter.   Medication Changes: No orders of the defined types were placed in this encounter.   Follow Up:  {F/U [phone]} {follow up:15908}  Signed, [name], MD   02/23/2019 5:49 AM    [facility]

## 2019-02-28 ENCOUNTER — Telehealth: Payer: Self-pay

## 2019-02-28 NOTE — Telephone Encounter (Signed)
Please review..it looks like she is due for an office visit.  (Last TSH was done in the ER 06/17/2018)   Ref Range & Units 73mo ago   TSH 0.350 - 4.500 uIU/mL 5.445High      Copied from [place] #[id number]. Topic: Appointment Scheduling - Scheduling Inquiry for Clinic >> Feb 28, 2019  9:55 AM Margaret [name] wrote: Patient calling to inquire when she should come in to recheck thyroid.

## 2019-02-28 NOTE — Telephone Encounter (Signed)
Can schedule OV.

## 2019-02-28 NOTE — Telephone Encounter (Signed)
LMTCB 02/28/2019.  Please schedule OV when pt calls back.    Thanks,   -Mickel Baas

## 2019-03-23 ENCOUNTER — Encounter: Payer: Self-pay | Admitting: Nurse Practitioner

## 2019-03-23 ENCOUNTER — Ambulatory Visit (INDEPENDENT_AMBULATORY_CARE_PROVIDER_SITE_OTHER): Payer: Medicaid Other | Admitting: Nurse Practitioner

## 2019-03-23 ENCOUNTER — Other Ambulatory Visit: Payer: Self-pay

## 2019-03-23 ENCOUNTER — Ambulatory Visit: Payer: Medicaid Other | Admitting: Nurse Practitioner

## 2019-03-23 VITALS — BP 120/86 | HR 71 | Ht 65.0 in | Wt 233.0 lb

## 2019-03-23 DIAGNOSIS — Q211 Atrial septal defect: Secondary | ICD-10-CM

## 2019-03-23 DIAGNOSIS — E782 Mixed hyperlipidemia: Secondary | ICD-10-CM

## 2019-03-23 DIAGNOSIS — Z720 Tobacco use: Secondary | ICD-10-CM | POA: Diagnosis not present

## 2019-03-23 DIAGNOSIS — Q2112 Patent foramen ovale: Secondary | ICD-10-CM

## 2019-03-23 DIAGNOSIS — I480 Paroxysmal atrial fibrillation: Secondary | ICD-10-CM | POA: Diagnosis not present

## 2019-03-23 NOTE — Patient Instructions (Signed)
Medication Instructions:  Your physician recommends that you continue on your current medications as directed. Please refer to the Current Medication list given to you today.  *If you need a refill on your cardiac medications before your next appointment, please call your pharmacy*  Lab Work: None ordered  If you have labs (blood work) drawn today and your tests are completely normal, you will receive your results only by: Marland Kitchen MyChart Message (if you have MyChart) OR . A paper copy in the mail If you have any lab test that is abnormal or we need to change your treatment, we will call you to review the results.  Testing/Procedures: None ordered   Follow-Up: At [facility], you and your health needs are our priority.  As part of our continuing mission to provide you with exceptional heart care, we have created designated Provider Care Teams.  These Care Teams include your primary Cardiologist (physician) and Advanced Practice Providers (APPs -  Physician Assistants and Nurse Practitioners) who all work together to provide you with the care you need, when you need it.  Your next appointment:   6 month(s)  The format for your next appointment:   In Person  Provider:    You may see Nelva Bush, MD or Murray Hodgkins, NP.

## 2019-03-23 NOTE — Progress Notes (Signed)
Office Visit    Patient Name: Margaret [name] Date of Encounter: 03/23/2019  Primary Care Provider:  Trinna Post, PA-C Primary Cardiologist:  Nelva Bush, MD  Chief Complaint    48 year old female with a history of stroke, paroxysmal atrial fibrillation on Eliquis, patent foreman ovale, hypothyroidism, asthma, and remote history of polysubstance abuse with ongoing tobacco abuse, who presents for follow-up of atrial fibrillation.  Past Medical History    Past Medical History:  Diagnosis Date  . Anxiety   . Asthma    controlled  . CVA (cerebral vascular accident) ([place])    a. 09/2016  . Depression   . Dysrhythmia   . Heart murmur   . Hypertension   . Hypothyroidism   . PAF (paroxysmal atrial fibrillation) ([place]) 09/2016   a. 30-day event monitor 6/18: predominant rhythm of sinus with isolated PACs and PVCs.  Two narrow complex tachycardia episodes concerning for A. Fib +/-atrial flutter or SVT.  No prolonged pauses; b. CHADS2VASc = 3 (stroke x 2, female)--> Eliquis.  Marland Kitchen PFO (patent foramen ovale)    a. TTE 6/18: EF of 60 to 65%, normal wall motion, grade 1 diastolic dysfunction; b. TEE 6/18: evidence of a very small atrial level right to left shunt by bubble study without visualization of ASD or PFO  . Substance abuse ([place])    Past use of cocaine   . Tobacco abuse    Past Surgical History:  Procedure Laterality Date  . FRACTURE SURGERY    . TEE WITHOUT CARDIOVERSION N/A 09/08/2016   Procedure: TRANSESOPHAGEAL ECHOCARDIOGRAM (TEE);  Surgeon: Nelva Bush, MD;  Location: [facility] ORS;  Service: Cardiovascular;  Laterality: N/A;    Allergies  No Known Allergies  History of Present Illness    48 year old female with the above complex past medical history with hospitalization in June 2018 in the setting of slurred speech and right frontal lobe stroke.  Echocardiogram showed normal LV function with grade 1 diastolic dysfunction mild carotid ultrasound showed less than  50% bilateral internal carotid artery stenoses.  Transesophageal echocardiogram was undertaken and showed a very small atrial level right to left shunt consistent with PFO.  This was followed by a 30-day event monitor which showed 2 episodes of narrow complex tachycardia concerning for atrial fibrillation/flutter.  In that setting, she was placed on Xarelto and beta-blocker therapy was later switched from Xarelto to Eliquis.  Other history includes hypothyroidism, asthma, obesity, remote cocaine abuse, and tobacco and marijuana usage.  She was last seen via telemedicine visit in May.  At that time, she reported some increase in dyspnea on exertion in the setting of reduced activity and weight gain.  She was advised to contact us if symptoms worsen. 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  She denies chest pain, PND, orthopnea, dizziness, syncope, edema, or early satiety.  1 day about a month ago or so, she had a brief run of palpitations which resolved after sitting down eating something.  Home Medications    Prior to Admission medications   Medication Sig Start Date End Date Taking? Authorizing Provider  atorvastatin (LIPITOR) 40 MG tablet TAKE 1 TABLET BY MOUTH EVERY DAY AT 6 PM 02/16/19   Dunn, Areta Haber, PA-C  cyclobenzaprine (FLEXERIL) 5 MG tablet Take 1 tablet (5 mg total) by mouth at bedtime. 11/10/18   Carles Collet  M, PA-C  ELIQUIS 5 MG TABS tablet TAKE 1 TABLET BY MOUTH TWICE DAILY 11/02/18   End, Cristal Deerhristopher, MD  levothyroxine (SYNTHROID) 100 MCG tablet Take 1 tablet (100 mcg total) by mouth daily. 01/04/19   Trey SailorsPollak, Adriana M, PA-C  metoprolol tartrate (LOPRESSOR) 25 MG tablet Take 1 tablet (25 mg total) by mouth 2 (two) times daily. 02/25/18 02/25/19  Creig HinesBerge, Marquett Bertoli Ronald, NP  potassium chloride (K-DUR) 10 MEQ tablet Take 1 tablet (10 mEq total) by  mouth daily. 10/03/18 01/01/19  Iran OuchArida, Muhammad A, MD  sertraline (ZOLOFT) 100 MG tablet Take 1 tablet (100 mg total) by mouth daily. 11/21/18 05/20/19  Trey SailorsPollak, Adriana M, PA-C  valACYclovir (VALTREX) 1000 MG tablet Take 1 tablet (1,000 mg total) by mouth 2 (two) times daily. 11/10/18   Trey SailorsPollak, Adriana M, PA-C    Review of Systems    Some baseline level of dyspnea on exertion at higher levels of activity though otherwise stable.  She had one episode of palpitations about a month ago which was fairly short-lived.  She denies chest pain, PND, orthopnea, dizziness, syncope, edema, or early satiety.  All other systems reviewed and are otherwise negative except as noted above.  Physical Exam    VS:  BP 120/86   Pulse 71   Ht 5\' 5"  (1.651 m)   Wt 233 lb (105.7 kg)   SpO2 98%   BMI 38.77 kg/m  , BMI Body mass index is 38.77 kg/m. GEN: Well nourished, well developed, in no acute distress. HEENT: normal. Neck: Supple, no JVD, carotid bruits, or masses. Cardiac: RRR, no murmurs, rubs, or gallops. No clubbing, cyanosis, edema.  Radials/DP/PT 2+ and equal bilaterally.  Respiratory:  Respirations regular and unlabored, clear to auscultation bilaterally. GI: Soft, nontender, nondistended, BS + x 4. MS: no deformity or atrophy. Skin: warm and dry, no rash. Neuro:  Strength and sensation are intact. Psych: Normal affect.  Accessory Clinical Findings    ECG personally reviewed by me today -regular sinus rhythm, 71, nonspecific T changes - no acute changes.  Lab Results  Component Value Date   WBC 15.2 (H) 06/17/2018   HGB 12.7 06/17/2018   HCT 39.4 06/17/2018   MCV 85.8 06/17/2018   PLT 366 06/17/2018   Lab Results  Component Value Date   CREATININE 0.66 06/17/2018   BUN 6 06/17/2018   NA 141 06/17/2018   K 3.3 (L) 06/17/2018   CL 106 06/17/2018   CO2 27 06/17/2018   Lab Results  Component Value Date   ALT 18 06/17/2018   AST 19 06/17/2018   ALKPHOS 92 06/17/2018   BILITOT 0.8  06/17/2018   Lab Results  Component Value Date   CHOL 135 02/24/2018   HDL 37 (L) 02/24/2018   LDLCALC 66 02/24/2018   TRIG 161 (H) 02/24/2018   CHOLHDL 3.6 02/24/2018    Lab Results  Component Value Date   HGBA1C 5.8 (H) 09/08/2016    Assessment & Plan    1.  Paroxysmal atrial fibrillation: Patient is maintaining sinus rhythm.  She did have one brief episode of palpitations about a month ago though thinks that her blood sugar was just low as symptoms resolved after eating something.  She remains on beta-blocker and Eliquis therapy.  She is due for follow-up lab work but has not for me today that she will have follow-up annual labs next Tuesday with primary care.  2.  History of stroke: No residual deficits.  She remains on Eliquis and statin therapy.  Due for labs next Tuesday.  3.  PFO: Small PFO noted on transesophageal echocardiogram at the time of stroke in 2018.  She remains on lifelong anticoagulation in the setting of A. fib and thus no intervention is required.  4.  Tobacco and marijuana abuse: She continues to smoke about half a pack a day.  She rarely uses marijuana-maybe once every couple of months.  Complete cessation advised.  We did discuss potentially using nicotine patches when she sets her mind to quitting.  She had not considered them before.  5.  Hypothyroidism: She remains on levothyroxine.  TSH was mildly elevated in March.  Due for follow-up labs next week with primary care.  6.  Morbid obesity: She is up 18 pounds since May.  She attributes this to less activity in the setting of COVID-19.  She says she is now trying to cut her calories back some.  7.  Hyperlipidemia:  LDL 66 last year - due for labs next week.  cont statin rx.  8.  Disposition: Patient will have follow-up labs with primary care next Tuesday.  Otherwise, follow-up here in 6 months or sooner if necessary.   Nicolasa Ducking, NP 03/23/2019, 4:01 PM

## 2019-03-23 NOTE — Progress Notes (Deleted)
Office Visit    Patient Name: Margaret [name] Date of Encounter: 03/23/2019  Primary Care Provider:  Trey Sailors, PA-C Primary Cardiologist:  [name], MD  Chief Complaint    48 year old female with a history of paroxysmal atrial fibrillation complicated by stroke, patent foramen ovale, hypothyroidism, asthma, and remote history of polysubstance abuse, who presents for follow-up of PAF.  Past Medical History    Past Medical History:  Diagnosis Date  . Anxiety   . Asthma    controlled  . CVA (cerebral vascular accident) ([facility])    a. 09/2016  . Depression   . Dysrhythmia   . Heart murmur   . Hypertension   . Hypothyroidism   . PAF (paroxysmal atrial fibrillation) ([facility]) 09/2016   a. 30-day event monitor 6/18: predominant rhythm of sinus with isolated PACs and PVCs.  Two narrow complex tachycardia episodes concerning for A. Fib +/-atrial flutter or SVT.  No prolonged pauses; b. CHADS2VASc = 3 (stroke x 2, female)--> Eliquis.  Marland Kitchen PFO (patent foramen ovale)    a. TTE 6/18: EF of 60 to 65%, normal wall motion, grade 1 diastolic dysfunction; b. TEE 6/18: evidence of a very small atrial level right to left shunt by bubble study without visualization of ASD or PFO  . Substance abuse ([facility])    Past use of cocaine   . Tobacco abuse    Past Surgical History:  Procedure Laterality Date  . FRACTURE SURGERY    . TEE WITHOUT CARDIOVERSION N/A 09/08/2016   Procedure: TRANSESOPHAGEAL ECHOCARDIOGRAM (TEE);  Surgeon: [name], MD;  Location: [facility] ORS;  Service: Cardiovascular;  Laterality: N/A;    Allergies  No Known Allergies  History of Present Illness    48 year old female with the above complex past medical history with hospitalization in June 2018 in the setting of slurred speech and right frontal lobe stroke.  Echocardiogram showed normal LV function with grade 1 diastolic dysfunction, while carotid ultrasound showed less than 50% bilateral internal carotid artery  stenoses.  Transesophageal echocardiogram was undertaken and showed a very small atrial level right to left shunt consistent with PFO.  This was followed by 30-day event monitor, which showed 2 episodes of narrow complex tachycardia concerning for atrial fibrillation/flutter.  In that setting, she was placed on Xarelto and beta-blocker therapy was later switched from Xarelto to Eliquis.  Other history includes hypothyroidism, asthma, obesity, remote cocaine abuse, and ongoing tobacco abuse with occasional marijuana usage.  She was last seen via telemedicine visit in May of this year, at which time she reported a 1 month history of dyspnea on exertion, increased fatigue, and depression.  She also reported weight gain in the setting of inactivity.  In office visit was advised if symptoms worsen.  Home Medications    Prior to Admission medications   Medication Sig Start Date End Date Taking? Authorizing Provider  atorvastatin (LIPITOR) 40 MG tablet TAKE 1 TABLET BY MOUTH EVERY DAY AT 6 PM 02/16/19   Dunn, Raymon Mutton, PA-C  cyclobenzaprine (FLEXERIL) 5 MG tablet Take 1 tablet (5 mg total) by mouth at bedtime. 11/10/18   Pollak, Adriana M, PA-C  ELIQUIS 5 MG TABS tablet TAKE 1 TABLET BY MOUTH TWICE DAILY 11/02/18   End, Cristal Deer, MD  levothyroxine (SYNTHROID) 100 MCG tablet Take 1 tablet (100 mcg total) by mouth daily. 01/04/19   Trey Sailors, PA-C  metoprolol tartrate (LOPRESSOR) 25 MG tablet Take 1 tablet (25 mg total) by mouth 2 (two) times daily.  02/25/18 02/25/19  Theora Gianotti, NP  potassium chloride (K-DUR) 10 MEQ tablet Take 1 tablet (10 mEq total) by mouth daily. 10/03/18 01/01/19  Wellington Hampshire, MD  sertraline (ZOLOFT) 100 MG tablet Take 1 tablet (100 mg total) by mouth daily. 11/21/18 05/20/19  Trinna Post, PA-C  valACYclovir (VALTREX) 1000 MG tablet Take 1 tablet (1,000 mg total) by mouth 2 (two) times daily. 11/10/18   Trinna Post, PA-C    Review of Systems    ***.   All other systems reviewed and are otherwise negative except as noted above.  Physical Exam    VS:  There were no vitals taken for this visit. , BMI There is no height or weight on file to calculate BMI. GEN: Well nourished, well developed, in no acute distress. HEENT: normal. Neck: Supple, no JVD, carotid bruits, or masses. Cardiac: RRR, no murmurs, rubs, or gallops. No clubbing, cyanosis, edema.  Radials/DP/PT 2+ and equal bilaterally.  Respiratory:  Respirations regular and unlabored, clear to auscultation bilaterally. GI: Soft, nontender, nondistended, BS + x 4. MS: no deformity or atrophy. Skin: warm and dry, no rash. Neuro:  Strength and sensation are intact. Psych: Normal affect.  Accessory Clinical Findings    ECG personally reviewed by me today - *** - no acute changes.  Lab Results  Component Value Date   WBC 15.2 (H) 06/17/2018   HGB 12.7 06/17/2018   HCT 39.4 06/17/2018   MCV 85.8 06/17/2018   PLT 366 06/17/2018   Lab Results  Component Value Date   CREATININE 0.66 06/17/2018   BUN 6 06/17/2018   NA 141 06/17/2018   K 3.3 (L) 06/17/2018   CL 106 06/17/2018   CO2 27 06/17/2018   Lab Results  Component Value Date   ALT 18 06/17/2018   AST 19 06/17/2018   ALKPHOS 92 06/17/2018   BILITOT 0.8 06/17/2018   Lab Results  Component Value Date   CHOL 135 02/24/2018   HDL 37 (L) 02/24/2018   LDLCALC 66 02/24/2018   TRIG 161 (H) 02/24/2018   CHOLHDL 3.6 02/24/2018    Lab Results  Component Value Date   HGBA1C 5.8 (H) 09/08/2016    Assessment & Plan    1.  ***   Murray Hodgkins, NP 03/23/2019, 8:07 AM

## 2019-03-28 ENCOUNTER — Encounter: Payer: Medicaid Other | Admitting: Physician Assistant

## 2019-03-29 ENCOUNTER — Telehealth: Payer: Self-pay

## 2019-03-29 NOTE — Telephone Encounter (Signed)
Pt should be able to view the results on Mychart.  Tried calling pt but her voicemail is not set up.    Thanks,   -Mickel Baas

## 2019-03-29 NOTE — Telephone Encounter (Signed)
Copied from [place] [id number]. Topic: General - Inquiry >> Mar 29, 2019  2:10 PM Lennox Solders wrote: Reason for CRM: pt would copy of tb blood work

## 2019-03-30 ENCOUNTER — Telehealth: Payer: Self-pay | Admitting: Physician Assistant

## 2019-03-30 NOTE — Telephone Encounter (Signed)
Patient is calling to see if the office received her TB test [phone]

## 2019-04-03 NOTE — Telephone Encounter (Signed)
Contacted patient and she was advised that her results was left up front to be picked up.

## 2019-04-03 NOTE — Telephone Encounter (Signed)
The only TB test I see is from 04/19/2018.  Tried calling pt to get more information but her voicemail box is not set up.   Thanks,   -Mickel Baas

## 2019-04-04 NOTE — Telephone Encounter (Signed)
Patient came by office yesterday,04/03/2019 and picked up TB results from 04/19/2018.

## 2019-04-11 ENCOUNTER — Ambulatory Visit: Payer: Medicaid Other | Admitting: Family Medicine

## 2019-04-12 ENCOUNTER — Ambulatory Visit: Payer: Medicaid Other | Admitting: Nurse Practitioner

## 2019-04-27 ENCOUNTER — Ambulatory Visit (INDEPENDENT_AMBULATORY_CARE_PROVIDER_SITE_OTHER): Payer: Medicaid Other | Admitting: Physician Assistant

## 2019-04-27 ENCOUNTER — Ambulatory Visit: Payer: Medicaid Other | Admitting: Podiatry

## 2019-04-27 ENCOUNTER — Telehealth: Payer: Self-pay

## 2019-04-27 DIAGNOSIS — R5383 Other fatigue: Secondary | ICD-10-CM

## 2019-04-27 DIAGNOSIS — E039 Hypothyroidism, unspecified: Secondary | ICD-10-CM

## 2019-04-27 DIAGNOSIS — M542 Cervicalgia: Secondary | ICD-10-CM | POA: Diagnosis not present

## 2019-04-27 DIAGNOSIS — M791 Myalgia, unspecified site: Secondary | ICD-10-CM

## 2019-04-27 DIAGNOSIS — R110 Nausea: Secondary | ICD-10-CM | POA: Diagnosis not present

## 2019-04-27 MED ORDER — ONDANSETRON HCL 4 MG PO TABS: 4.0000 mg | ORAL_TABLET | Freq: Three times a day (TID) | ORAL | 0 refills | Status: DC | PRN

## 2019-04-27 NOTE — Progress Notes (Signed)
Patient: Margaret [name] Female    DOB: Oct 01, 1970   49 y.o.   MRN: [id number] Visit Date: 04/27/2019  Today's Provider: Trey Sailors, PA-C   Chief Complaint  Patient presents with  . Hand Pain  . Abdominal Cramping   Subjective:    I, Margaret [name],CMA am acting as a Neurosurgeon for [place].  Virtual Visit via Telephone Note  I connected with Margaret [name] on 04/27/19 at  3:40 PM EST by telephone and verified that I am speaking with the correct person using two identifiers.  Location: Patient: Home Provider: Office   I discussed the limitations, risks, security and privacy concerns of performing an evaluation and management service by telephone and the availability of in person appointments. I also discussed with the patient that there may be a patient responsible charge related to this service. The patient expressed understanding and agreed to proceed.   Hand Pain  The incident occurred 6 to 12 hours ago. There was no injury mechanism. The pain is present in the right fingers and right hand. The quality of the pain is described as cramping. The pain radiates to the back. Pertinent negatives include no chest pain, numbness or tingling. Nothing aggravates the symptoms. She has tried acetaminophen for the symptoms.  Abdominal Cramping This is a new problem. The current episode started yesterday. The problem occurs intermittently. The problem has been unchanged. The pain is located in the LLQ and RLQ. The pain is at a severity of 5/10. The quality of the pain is aching. Associated symptoms include headaches and nausea. Pertinent negatives include no constipation, diarrhea or vomiting. The pain is relieved by bowel movements. She has tried acetaminophen for the symptoms.   Reports she is having nausea without vomiting as well as myalgias ongoing since this past Monday. She also reports feeling light headed and moving. Denies fevers. She reports having a sharp abdominal  pain on Monday but this was relieved by bowel movement. Denies coughing. Reports she got the COVID vaccine at beginning of January, this was the first dose.   No Known Allergies   Current Outpatient Medications:  .  atorvastatin (LIPITOR) 40 MG tablet, TAKE 1 TABLET BY MOUTH EVERY DAY AT 6 PM, Disp: 90 tablet, Rfl: 0 .  cyclobenzaprine (FLEXERIL) 5 MG tablet, Take 1 tablet (5 mg total) by mouth at bedtime., Disp: 30 tablet, Rfl: 0 .  ELIQUIS 5 MG TABS tablet, TAKE 1 TABLET BY MOUTH TWICE DAILY, Disp: 60 tablet, Rfl: 6 .  levothyroxine (SYNTHROID) 100 MCG tablet, Take 1 tablet (100 mcg total) by mouth daily., Disp: 90 tablet, Rfl: 3 .  sertraline (ZOLOFT) 100 MG tablet, Take 1 tablet (100 mg total) by mouth daily., Disp: 90 tablet, Rfl: 1 .  valACYclovir (VALTREX) 1000 MG tablet, Take 1 tablet (1,000 mg total) by mouth 2 (two) times daily., Disp: 60 tablet, Rfl: 0 .  metoprolol tartrate (LOPRESSOR) 25 MG tablet, Take 1 tablet (25 mg total) by mouth 2 (two) times daily., Disp: 180 tablet, Rfl: 3 .  potassium chloride (K-DUR) 10 MEQ tablet, Take 1 tablet (10 mEq total) by mouth daily., Disp: 90 tablet, Rfl: 1  Review of Systems  Cardiovascular: Negative for chest pain.  Gastrointestinal: Positive for nausea. Negative for constipation, diarrhea and vomiting.  Neurological: Positive for headaches. Negative for tingling and numbness.    Social History   Tobacco Use  . Smoking status: Current Every Day Smoker    Packs/day: 0.25  Types: Cigarettes  . Smokeless tobacco: Former Systems developer    Types: Snuff  . Tobacco comment: trying to quit; going to start using patches;   Substance Use Topics  . Alcohol use: Not Currently    Alcohol/week: 0.0 standard drinks    Comment: on occasion      Objective:   There were no vitals taken for this visit. There were no vitals filed for this visit.There is no height or weight on file to calculate BMI.   Physical Exam   No results found for any visits  on 04/27/19.     Assessment & Plan    1. Neck pain  - Ambulatory referral to Chiropractic  2. Other fatigue   3. Nausea  Advised on COVID testing scheduling either on line or on the phone. Phone number provided. Work note written.   - ondansetron (ZOFRAN) 4 MG tablet; Take 1 tablet (4 mg total) by mouth every 8 (eight) hours as needed for nausea or vomiting.  Dispense: 20 tablet; Refill: 0  4. Myalgia  May take tylenol for PAIn.  5. Hypothyroidism, unspecified type  - TSH+T4F+T3Free  I discussed the assessment and treatment plan with the patient. The patient was provided an opportunity to ask questions and all were answered. The patient agreed with the plan and demonstrated an understanding of the instructions.   The patient was advised to call back or seek an in-person evaluation if the symptoms worsen or if the condition fails to improve as anticipated.  I provided 25 minutes of non-face-to-face time during this encounter.  The entirety of the information documented in the History of Present Illness, Review of Systems and Physical Exam were personally obtained by me. Portions of this information were initially documented by [facility] and reviewed by me for thoroughness and accuracy.      Trinna Post, PA-C  [facility]

## 2019-04-27 NOTE — Telephone Encounter (Signed)
Patient's call was returned and she wanted to have a telephone visit.

## 2019-04-27 NOTE — Telephone Encounter (Signed)
Copied from CRM [id number]. Topic: Appointment Scheduling - Scheduling Inquiry for Clinic >> Apr 27, 2019  2:07 PM Daphine Deutscher D wrote: Reason for CRM: Pt called saying she has an appt with Ricki Rodriguez at 3:40 virtual.   She called and changed her email address.   She was calling to say she has not received a email or text yet.  I verified her email and that's when she realized it was wrong.

## 2019-04-28 ENCOUNTER — Ambulatory Visit: Payer: Medicaid Other | Attending: Internal Medicine

## 2019-04-28 DIAGNOSIS — Z20822 Contact with and (suspected) exposure to covid-19: Secondary | ICD-10-CM

## 2019-04-29 LAB — NOVEL CORONAVIRUS, NAA: SARS-CoV-2, NAA: NOT DETECTED

## 2019-05-04 ENCOUNTER — Ambulatory Visit: Payer: Medicaid Other | Admitting: Podiatry

## 2019-05-04 ENCOUNTER — Telehealth: Payer: Self-pay

## 2019-05-04 NOTE — Telephone Encounter (Signed)
Letter and COVID results was faxed to patients job as patient requested.

## 2019-05-04 NOTE — Telephone Encounter (Signed)
Copied from CRM [id number]. Topic: General - Other >> May 04, 2019  3:51 PM Daphine Deutscher D wrote: Reason for CRM: Pt called asking for a note to be faxed to her work saying that she needs to be out of work.  She said Adriana sent a letter in her My Chart but she can not download it or print it to give to her employer.  She ask that the same note that Ricki Rodriguez put in her My Chart needs to be faxed to the number below.  She ask that her negative results for covid be sent also.   Fax [phone] attn Joni Reining or Casimiro Needle

## 2019-05-16 ENCOUNTER — Other Ambulatory Visit: Payer: Self-pay | Admitting: Orthopedic Surgery

## 2019-05-23 ENCOUNTER — Encounter
Admission: RE | Admit: 2019-05-23 | Discharge: 2019-05-23 | Disposition: A | Discharge status: Home / Self Care | Payer: Medicaid Other | Source: Ambulatory Visit | Attending: Orthopedic Surgery | Admitting: Orthopedic Surgery

## 2019-05-23 ENCOUNTER — Ambulatory Visit (INDEPENDENT_AMBULATORY_CARE_PROVIDER_SITE_OTHER): Payer: Medicaid Other | Admitting: Physician Assistant

## 2019-05-23 ENCOUNTER — Other Ambulatory Visit: Payer: Self-pay

## 2019-05-23 DIAGNOSIS — K439 Ventral hernia without obstruction or gangrene: Secondary | ICD-10-CM | POA: Diagnosis not present

## 2019-05-23 NOTE — Patient Instructions (Signed)
Your COVID test is scheduled on:  Tuesday 05/30/19.  Drive up before any time 5:36-14:43 am in front of [address].  Your procedure is scheduled on: Thursday 06/01/19 Report to Same Day Surgery.  Enter through [employer], then take elevator to 2nd floor.  Check in with surgery information desk. To find out your arrival time, call [phone] 1:00-3:00 PM on Wednesday 05/31/19  Remember: Instructions that are not followed completely may result in serious medical risk, up to and including death, or upon the discretion of your surgeon and anesthesiologist your surgery may need to be rescheduled.   __x__ 1. Do not eat food (including mints, candies, chewing gum) after midnight the night before your procedure. You may drink clear liquids up to 2 hours before you are scheduled to arrive at the hospital for your procedure.  Do not drink anything within 2 hours of your scheduled arrival to the hospital.  Approved clear liquids:  --Water or Apple juice without pulp  --Clear carbohydrate beverage such as Gatorade or Powerade  --Black Coffee or Clear Tea (No milk, no creamers, do not add anything to the coffee or tea)   __x__ 2. Finish your provided Ensure pre-surgery drink 2 hours before your scheduled arrival on the day of surgery.  __x__ 3. No Alcohol or smoking for 24 hours before or after surgery.  __x__ 4. Notify your doctor if there is any change in your medical condition (cold, fever, infections).  __x__ 5. On the morning of surgery brush your teeth with toothpaste and water.  You may rinse your mouth with mouthwash if you wish.  Do not swallow any toothpaste or mouthwash.  Please read over the following fact sheets that you were given:  [facility] Preparing for Surgery and/or MRSA Information, How to Use an Incentive Spirometer, Advance Directives Booklet  __x__ Use CHG Soap provided as directed on instruction sheet.  Do not wear jewelry, make-up, hairpins, clips or nail  polish on the day of surgery. Do not wear lotions, powders, deodorant, or perfumes.  Do not shave below the face/neck 48 hours prior to surgery.  Do not bring valuables to the hospital.  [facility] is not responsible for any belongings or valuables.   For patients discharged on the day of surgery, you will NOT be permitted to drive yourself home.  You must have a responsible adult with you for 24 hours after surgery.  __x__ Take these medicines on the morning of surgery with a SMALL SIP OF WATER:  1. Levothyroxine (Synthroid)  2. Metoprolol (Lopressor)  __x__ Follow recommendations from Cardiologist, Pulmonologist or PCP regarding stopping Eliquis before surgery, or any blood thinners such as Coumadin, Plavix, Effient, Pradaxa, and Pletal.  __x__ STARTING TODAY: Do not take any Anti-inflammatories such as Advil, Ibuprofen, Motrin, Aleve, Naproxen, Naprosyn, BC/Goodies powders or aspirin products. You may take Tylenol if needed.   __x__ STARTING TODAY: Do not take any over the counter supplements until after surgery. You may continue to take your multivitamin.

## 2019-05-23 NOTE — Progress Notes (Signed)
Patient: Margaret [name] Female    DOB: 1970-09-17   49 y.o.   MRN: [id number] Visit Date: 05/23/2019  Today's Provider: Trey Sailors, PA-C   Chief Complaint  Patient presents with  . Hernia   Subjective:    Virtual Visit via Telephone Note  I connected with [name] on 05/23/19 at 11:00 AM EST by telephone and verified that I am speaking with the correct person using two identifiers.  Location: Patient: home Provider: office   I discussed the limitations, risks, security and privacy concerns of performing an evaluation and management service by telephone and the availability of in person appointments. I also discussed with the patient that there may be a patient responsible charge related to this service. The patient expressed understanding and agreed to proceed.   HPI  Hernia Patient presents today virtually for hernia. Patient had abdominal pain last year and underwent US abdomen in 06/2018. This revealed 1 cm fat containing ventral hernia but was otherwise normal. Patient wants referral to a surgeon to explain what is going on. She is concerned she might be pregnant because the umbilicus might be a sign of pregnancy.  No Known Allergies   Current Outpatient Medications:  .  atorvastatin (LIPITOR) 40 MG tablet, TAKE 1 TABLET BY MOUTH EVERY DAY AT 6 PM (Patient taking differently: Take 40 mg by mouth at bedtime. ), Disp: 90 tablet, Rfl: 0 .  ciclopirox (PENLAC) 8 % solution, Apply 1 application topically daily. Put on toes for 7 days then wipe off, Disp: , Rfl:  .  cyclobenzaprine (FLEXERIL) 5 MG tablet, Take 1 tablet (5 mg total) by mouth at bedtime. (Patient not taking: Reported on 05/17/2019), Disp: 30 tablet, Rfl: 0 .  ELIQUIS 5 MG TABS tablet, TAKE 1 TABLET BY MOUTH TWICE DAILY (Patient taking differently: Take 5 mg by mouth 2 (two) times daily. ), Disp: 60 tablet, Rfl: 6 .  levothyroxine (SYNTHROID) 100 MCG tablet, Take 1 tablet (100 mcg total) by mouth daily.  (Patient taking differently: Take 125 mcg by mouth daily. ), Disp: 90 tablet, Rfl: 3 .  metoprolol tartrate (LOPRESSOR) 25 MG tablet, Take 1 tablet (25 mg total) by mouth 2 (two) times daily., Disp: 180 tablet, Rfl: 3 .  Multiple Vitamins-Minerals (MULTIVITAMIN WITH MINERALS) tablet, Take 1 tablet by mouth daily., Disp: , Rfl:  .  nicotine (NICODERM CQ - DOSED IN MG/24 HOURS) 14 mg/24hr patch, Place 14 mg onto the skin daily as needed., Disp: , Rfl:  .  ondansetron (ZOFRAN) 4 MG tablet, Take 1 tablet (4 mg total) by mouth every 8 (eight) hours as needed for nausea or vomiting., Disp: 20 tablet, Rfl: 0 .  prazosin (MINIPRESS) 2 MG capsule, Take 2 mg by mouth daily as needed (Sleep). In the evening, Disp: , Rfl:  .  sertraline (ZOLOFT) 100 MG tablet, Take 1 tablet (100 mg total) by mouth daily. (Patient taking differently: Take 25 mg by mouth daily. ), Disp: 90 tablet, Rfl: 1 .  valACYclovir (VALTREX) 1000 MG tablet, Take 1 tablet (1,000 mg total) by mouth 2 (two) times daily. (Patient not taking: Reported on 05/17/2019), Disp: 60 tablet, Rfl: 0  Review of Systems  Social History   Tobacco Use  . Smoking status: Current Every Day Smoker    Packs/day: 0.25    Types: Cigarettes  . Smokeless tobacco: Former Neurosurgeon    Types: Snuff  . Tobacco comment: trying to quit; going to start using patches;  Substance Use Topics  . Alcohol use: Not Currently    Alcohol/week: 0.0 standard drinks    Comment: on occasion      Objective:   There were no vitals taken for this visit. There were no vitals filed for this visit.There is no height or weight on file to calculate BMI.   Physical Exam   No results found for any visits on 05/23/19.     Assessment & Plan    1. Ventral hernia without obstruction or gangrene  Counseled what a hernia is and that hers is fat containing. Counseled that there may be no surgical intervention necessary for this. Counseled that I certainly would not think she is  pregnant based on these symptoms alone considering these symptoms have been present since 06/2018. Patient would like to see the surgeon.   - Ambulatory referral to General Surgery  I discussed the assessment and treatment plan with the patient. The patient was provided an opportunity to ask questions and all were answered. The patient agreed with the plan and demonstrated an understanding of the instructions.   The patient was advised to call back or seek an in-person evaluation if the symptoms worsen or if the condition fails to improve as anticipated.  I provided 15 minutes of non-face-to-face time during this encounter.  The entirety of the information documented in the History of Present Illness, Review of Systems and Physical Exam were personally obtained by me. Portions of this information were initially documented by [facility] and reviewed by me for thoroughness and accuracy.       Trena Platt Cummings,acting as a Education administrator for Trinna Post, PA-C.,have documented all relevant documentation on the behalf of Trinna Post, PA-C,as directed by  Trinna Post, PA-C while in the presence of Trinna Post, PA-C.  Trinna Post, PA-C  [facility]

## 2019-05-24 NOTE — Pre-Procedure Instructions (Signed)
Patient to stop Eliquis on 05/30/19 per Dr. Rosita Kea. Patient was notified by [place] from Dr. Neomia Glass office.

## 2019-05-25 ENCOUNTER — Ambulatory Visit: Payer: Medicaid Other | Admitting: Surgery

## 2019-05-30 ENCOUNTER — Other Ambulatory Visit: Payer: Self-pay

## 2019-05-30 ENCOUNTER — Other Ambulatory Visit: Payer: Self-pay | Admitting: *Deleted

## 2019-05-30 ENCOUNTER — Other Ambulatory Visit
Admission: RE | Admit: 2019-05-30 | Discharge: 2019-05-30 | Disposition: A | Discharge status: Home / Self Care | Payer: Medicaid Other | Source: Ambulatory Visit | Attending: Orthopedic Surgery | Admitting: Orthopedic Surgery

## 2019-05-30 DIAGNOSIS — U071 COVID-19: Secondary | ICD-10-CM | POA: Diagnosis not present

## 2019-05-30 DIAGNOSIS — Z01812 Encounter for preprocedural laboratory examination: Secondary | ICD-10-CM | POA: Diagnosis present

## 2019-05-30 LAB — CBC
HCT: 37.4 % (ref 36.0–46.0)
Hemoglobin: 12.1 g/dL (ref 12.0–15.0)
MCH: 27.7 pg (ref 26.0–34.0)
MCHC: 32.4 g/dL (ref 30.0–36.0)
MCV: 85.6 fL (ref 80.0–100.0)
Platelets: 367 10*3/uL (ref 150–400)
RBC: 4.37 MIL/uL (ref 3.87–5.11)
RDW: 14.5 % (ref 11.5–15.5)
WBC: 12.2 10*3/uL — ABNORMAL HIGH (ref 4.0–10.5)
nRBC: 0 % (ref 0.0–0.2)

## 2019-05-30 LAB — SARS CORONAVIRUS 2 (TAT 6-24 HRS): SARS Coronavirus 2: POSITIVE — AB

## 2019-05-30 LAB — BASIC METABOLIC PANEL
Anion gap: 7 (ref 5–15)
BUN: 10 mg/dL (ref 6–20)
CO2: 30 mmol/L (ref 22–32)
Calcium: 9.3 mg/dL (ref 8.9–10.3)
Chloride: 102 mmol/L (ref 98–111)
Creatinine, Ser: 0.74 mg/dL (ref 0.44–1.00)
GFR calc Af Amer: >60 mL/min (ref >60)
GFR calc non Af Amer: >60 mL/min (ref >60)
Glucose, Bld: 83 mg/dL (ref 70–99)
Potassium: 3.4 mmol/L — ABNORMAL LOW (ref 3.5–5.1)
Sodium: 139 mmol/L (ref 135–145)

## 2019-05-30 MED ORDER — METOPROLOL TARTRATE 25 MG PO TABS: 25.0000 mg | ORAL_TABLET | Freq: Two times a day (BID) | ORAL | 1 refills | Status: DC

## 2019-05-30 NOTE — Telephone Encounter (Signed)
Requested Prescriptions   Signed Prescriptions Disp Refills  . metoprolol tartrate (LOPRESSOR) 25 MG tablet 180 tablet 1    Sig: Take 1 tablet (25 mg total) by mouth 2 (two) times daily.    Authorizing Provider: Creig Hines    Ordering User: Kendrick Fries

## 2019-05-31 ENCOUNTER — Telehealth: Payer: Self-pay | Admitting: Physician Assistant

## 2019-05-31 ENCOUNTER — Encounter: Payer: Self-pay | Admitting: Physician Assistant

## 2019-05-31 ENCOUNTER — Other Ambulatory Visit: Payer: Self-pay | Admitting: Physician Assistant

## 2019-05-31 DIAGNOSIS — U071 COVID-19: Secondary | ICD-10-CM

## 2019-05-31 NOTE — Telephone Encounter (Signed)
Called to discuss with patient about Covid symptoms and the use of bamlanivimab or casirivimab/imdevimab, a monoclonal antibody infusion for those with mild to moderate Covid symptoms and at a high risk of hospitalization.  Pt is qualified for this infusion at the [facility] infusion center due to BMI>35. Also has a history of CVD (stroke and afib)   Message left to call back and sent a mychart message. Of note, she is scheduled for knee surgery tomorrow. I will CC Dr. Garret Reddish PA-C  [facility]

## 2019-05-31 NOTE — Progress Notes (Signed)
  I connected by phone with Margaret [name] on 05/31/2019 at 12:20 PM to discuss the potential use of an new treatment for mild to moderate COVID-19 viral infection in non-hospitalized patients.  This patient is a 49 y.o. female that meets the FDA criteria for Emergency Use Authorization of bamlanivimab or casirivimab\imdevimab.  Has a (+) direct SARS-CoV-2 viral test result  Has mild or moderate COVID-19   Is ? 48 years of age and weighs ? 40 kg  Is NOT hospitalized due to COVID-19  Is NOT requiring oxygen therapy or requiring an increase in baseline oxygen flow rate due to COVID-19  Is within 10 days of symptom onset  Has at least one of the high risk factor(s) for progression to severe COVID-19 and/or hospitalization as defined in EUA.  Specific high risk criteria : BMI >/= 35   I have spoken and communicated the following to the patient or parent/caregiver:  1. FDA has authorized the emergency use of bamlanivimab and casirivimab\imdevimab for the treatment of mild to moderate COVID-19 in adults and pediatric patients with positive results of direct SARS-CoV-2 viral testing who are 36 years of age and older weighing at least 40 kg, and who are at high risk for progressing to severe COVID-19 and/or hospitalization.  2. The significant known and potential risks and benefits of bamlanivimab and casirivimab\imdevimab, and the extent to which such potential risks and benefits are unknown.  3. Information on available alternative treatments and the risks and benefits of those alternatives, including clinical trials.  4. Patients treated with bamlanivimab and casirivimab\imdevimab should continue to self-isolate and use infection control measures (e.g., wear mask, isolate, social distance, avoid sharing personal items, clean and disinfect "high touch" surfaces, and frequent handwashing) according to CDC guidelines.   5. The patient or parent/caregiver has the option to accept or refuse  bamlanivimab or casirivimab\imdevimab .  After reviewing this information with the patient, The patient agreed to proceed with receiving the bamlanimivab infusion and will be provided a copy of the Fact sheet prior to receiving the infusion.   Sx onset 2/23 ( got second vaccine on Monday 2/22.) Set up for Mab infusion on Friday 2/26 @ 12:30pm given transportation issues.   [name] PA-C 05/31/2019 12:20 PM

## 2019-06-01 ENCOUNTER — Encounter: Admission: RE | Payer: Self-pay | Source: Ambulatory Visit

## 2019-06-01 ENCOUNTER — Ambulatory Visit (HOSPITAL_COMMUNITY)
Admission: RE | Admit: 2019-06-01 | Discharge: 2019-06-01 | Disposition: A | Discharge status: Home / Self Care | Payer: Medicaid Other | Source: Ambulatory Visit | Attending: Pulmonary Disease | Admitting: Pulmonary Disease

## 2019-06-01 ENCOUNTER — Ambulatory Visit: Admission: RE | Admit: 2019-06-01 | Payer: Medicaid Other | Source: Ambulatory Visit | Admitting: Orthopedic Surgery

## 2019-06-01 DIAGNOSIS — U071 COVID-19: Secondary | ICD-10-CM | POA: Diagnosis present

## 2019-06-01 SURGERY — ARTHROSCOPY, KNEE, WITH MEDIAL MENISCECTOMY
Anesthesia: Choice | Site: Knee | Laterality: Left

## 2019-06-01 MED ORDER — ALBUTEROL SULFATE HFA 108 (90 BASE) MCG/ACT IN AERS: 2.0000 | INHALATION_SPRAY | Freq: Once | RESPIRATORY_TRACT | Status: DC | PRN

## 2019-06-01 MED ORDER — EPINEPHRINE 0.3 MG/0.3ML IJ SOAJ: 0.3000 mg | Freq: Once | INTRAMUSCULAR | Status: DC | PRN

## 2019-06-01 MED ORDER — METHYLPREDNISOLONE SODIUM SUCC 125 MG IJ SOLR: 125.0000 mg | Freq: Once | INTRAMUSCULAR | Status: DC | PRN

## 2019-06-01 MED ORDER — FAMOTIDINE IN NACL 20-0.9 MG/50ML-% IV SOLN: 20.0000 mg | Freq: Once | INTRAVENOUS | Status: DC | PRN

## 2019-06-01 MED ORDER — SODIUM CHLORIDE 0.9 % IV SOLN
700.0000 mg | Freq: Once | INTRAVENOUS | Status: AC
  Administered 2019-06-01: 700 mg via INTRAVENOUS
  Filled 2019-06-01: qty 20

## 2019-06-01 MED ORDER — SODIUM CHLORIDE 0.9 % IV SOLN
INTRAVENOUS | Status: DC | PRN
  Administered 2019-06-01: 250 mL via INTRAVENOUS

## 2019-06-01 MED ORDER — DIPHENHYDRAMINE HCL 50 MG/ML IJ SOLN: 50.0000 mg | Freq: Once | INTRAMUSCULAR | Status: DC | PRN

## 2019-06-01 NOTE — Discharge Instructions (Signed)

## 2019-06-01 NOTE — Progress Notes (Signed)
  Diagnosis: COVID-19  Physician: Dr. Patrick Wright  Procedure: Covid Infusion Clinic Med: bamlanivimab infusion - Provided patient with bamlanimivab fact sheet for patients, parents and caregivers prior to infusion.  Complications: No immediate complications noted.  Discharge: Discharged home   Ally Yow 06/01/2019   

## 2019-06-02 ENCOUNTER — Other Ambulatory Visit: Payer: Self-pay | Admitting: Physician Assistant

## 2019-06-02 NOTE — Telephone Encounter (Signed)
Medication was refilled today by another provider.

## 2019-06-02 NOTE — Telephone Encounter (Signed)
Requested medication (s) are due for refill today: yes  Requested medication (s) are on the active medication list: yes  Last refill: 02/16/2019  Future visit scheduled: no  Notes to clinic:  Script per Eula Listen, PA-C    Requested Prescriptions  Pending Prescriptions Disp Refills   atorvastatin (LIPITOR) 40 MG tablet [Pharmacy Med Name: ATORVASTATIN 40MG  TABLETS] 90 tablet 0    Sig: TAKE 1 TABLET BY MOUTH EVERY DAY AT 6 PM      There is no refill protocol information for this order

## 2019-06-02 NOTE — Telephone Encounter (Signed)
Pt states she has been out of the medication for a couple of days and is requesting to have this medication filled today. Please advise.

## 2019-06-29 ENCOUNTER — Telehealth: Payer: Self-pay

## 2019-06-29 NOTE — Telephone Encounter (Signed)
Copied from CRM [id number]. Topic: General - Inquiry >> Jun 29, 2019 12:08 PM Leary Roca wrote: Reason for CRM: Patient would like to be referred to a skin doctor. Please advise

## 2019-06-29 NOTE — Telephone Encounter (Signed)
Patient schedule appointment for tomorrow,06/30/2019.

## 2019-06-30 ENCOUNTER — Ambulatory Visit (INDEPENDENT_AMBULATORY_CARE_PROVIDER_SITE_OTHER): Payer: Medicaid Other | Admitting: Physician Assistant

## 2019-06-30 DIAGNOSIS — R21 Rash and other nonspecific skin eruption: Secondary | ICD-10-CM

## 2019-06-30 MED ORDER — TRIAMCINOLONE ACETONIDE 0.1 % EX CREA: 1.0000 "application " | TOPICAL_CREAM | Freq: Two times a day (BID) | CUTANEOUS | 0 refills | Status: DC

## 2019-06-30 NOTE — Progress Notes (Signed)
Subjective:    Patient ID: Margaret [name], female    DOB: [birth year], 49 y.o.   MRN: [id number]  MARLEAH BEEVER is a 49 y.o. female presenting on 06/30/2019 for Rash  Virtual Visit via Telephone Note  I connected with Margaret [name] on 06/30/19 at 11:20 AM EDT by telephone and verified that I am speaking with the correct person using two identifiers.   I discussed the limitations, risks, security and privacy concerns of performing an evaluation and management service by telephone and the availability of in person appointments. I also discussed with the patient that there may be a patient responsible charge related to this service. The patient expressed understanding and agreed to proceed.    HPI  Patient reports that she has a rash on her chest that has been present for several days. It happened after she got her hair done. It does not itch. It showed up two days ago. Has not gotten bigger. Is not bleeding. Reports the rash is slightly red with many different bumps. She has tried an antifungal cream on the rash.  Social History   Tobacco Use  . Smoking status: Current Every Day Smoker    Packs/day: 0.25    Types: Cigarettes  . Smokeless tobacco: Former Systems developer    Types: Snuff  . Tobacco comment: trying to quit; going to start using patches;   Substance Use Topics  . Alcohol use: Not Currently    Alcohol/week: 0.0 standard drinks    Comment: on occasion  . Drug use: Not Currently    Types: Marijuana    Comment: Previously, but stopped years ago    Review of Systems Per HPI unless specifically indicated above     Objective:    There were no vitals taken for this visit.  Wt Readings from Last 3 Encounters:  05/23/19 238 lb (108 kg)  03/23/19 233 lb (105.7 kg)  08/15/18 215 lb (97.5 kg)    Physical Exam Results for orders placed or performed during the hospital encounter of 05/30/19  SARS CORONAVIRUS 2 (TAT 6-24 HRS) Nasopharyngeal Nasopharyngeal Swab   Specimen:  Nasopharyngeal Swab  Result Value Ref Range   SARS Coronavirus 2 POSITIVE (A) NEGATIVE  Basic metabolic panel  Result Value Ref Range   Sodium 139 135 - 145 mmol/L   Potassium 3.4 (L) 3.5 - 5.1 mmol/L   Chloride 102 98 - 111 mmol/L   CO2 30 22 - 32 mmol/L   Glucose, Bld 83 70 - 99 mg/dL   BUN 10 6 - 20 mg/dL   Creatinine, Ser 0.74 0.44 - 1.00 mg/dL   Calcium 9.3 8.9 - 10.3 mg/dL   GFR calc non Af Amer >60 >60 mL/min   GFR calc Af Amer >60 >60 mL/min   Anion gap 7 5 - 15  CBC  Result Value Ref Range   WBC 12.2 (H) 4.0 - 10.5 K/uL   RBC 4.37 3.87 - 5.11 MIL/uL   Hemoglobin 12.1 12.0 - 15.0 g/dL   HCT 37.4 36.0 - 46.0 %   MCV 85.6 80.0 - 100.0 fL   MCH 27.7 26.0 - 34.0 pg   MCHC 32.4 30.0 - 36.0 g/dL   RDW 14.5 11.5 - 15.5 %   Platelets 367 150 - 400 K/uL   nRBC 0.0 0.0 - 0.2 %      Assessment & Plan:  1. Rash  - triamcinolone cream (KENALOG) 0.1 %; Apply 1 application topically 2 (two) times daily.  Dispense: 30  g; Refill: 0  F/u PRN   Osvaldo Angst, PA-C [facility] 06/30/2019, 12:25 PM

## 2019-07-04 ENCOUNTER — Ambulatory Visit: Payer: Self-pay | Admitting: Physician Assistant

## 2019-07-05 ENCOUNTER — Other Ambulatory Visit: Payer: Self-pay

## 2019-07-05 ENCOUNTER — Emergency Department
Admission: EM | Admit: 2019-07-05 | Discharge: 2019-07-05 | Disposition: A | Discharge status: Home / Self Care | Payer: Medicaid Other | Attending: Emergency Medicine | Admitting: Emergency Medicine

## 2019-07-05 ENCOUNTER — Encounter: Payer: Self-pay | Admitting: Emergency Medicine

## 2019-07-05 ENCOUNTER — Emergency Department: Payer: Medicaid Other

## 2019-07-05 DIAGNOSIS — Z7901 Long term (current) use of anticoagulants: Secondary | ICD-10-CM | POA: Insufficient documentation

## 2019-07-05 DIAGNOSIS — S4991XA Unspecified injury of right shoulder and upper arm, initial encounter: Secondary | ICD-10-CM | POA: Diagnosis present

## 2019-07-05 DIAGNOSIS — M549 Dorsalgia, unspecified: Secondary | ICD-10-CM | POA: Insufficient documentation

## 2019-07-05 DIAGNOSIS — Y92512 Supermarket, store or market as the place of occurrence of the external cause: Secondary | ICD-10-CM | POA: Insufficient documentation

## 2019-07-05 DIAGNOSIS — I10 Essential (primary) hypertension: Secondary | ICD-10-CM | POA: Insufficient documentation

## 2019-07-05 DIAGNOSIS — Z79899 Other long term (current) drug therapy: Secondary | ICD-10-CM | POA: Insufficient documentation

## 2019-07-05 DIAGNOSIS — T07XXXA Unspecified multiple injuries, initial encounter: Secondary | ICD-10-CM

## 2019-07-05 DIAGNOSIS — Y999 Unspecified external cause status: Secondary | ICD-10-CM | POA: Insufficient documentation

## 2019-07-05 DIAGNOSIS — F17210 Nicotine dependence, cigarettes, uncomplicated: Secondary | ICD-10-CM | POA: Diagnosis not present

## 2019-07-05 DIAGNOSIS — Y939 Activity, unspecified: Secondary | ICD-10-CM | POA: Diagnosis not present

## 2019-07-05 DIAGNOSIS — W19XXXA Unspecified fall, initial encounter: Secondary | ICD-10-CM | POA: Diagnosis not present

## 2019-07-05 DIAGNOSIS — I4891 Unspecified atrial fibrillation: Secondary | ICD-10-CM | POA: Diagnosis not present

## 2019-07-05 DIAGNOSIS — M79672 Pain in left foot: Secondary | ICD-10-CM | POA: Diagnosis not present

## 2019-07-05 MED ORDER — HYDROCODONE-ACETAMINOPHEN 5-325 MG PO TABS: 1.0000 | ORAL_TABLET | Freq: Four times a day (QID) | ORAL | 0 refills | Status: DC | PRN

## 2019-07-05 NOTE — ED Triage Notes (Signed)
States she fell last pm  Having pain to neck,head and lower back  Also having pain to left foot

## 2019-07-05 NOTE — Discharge Instructions (Addendum)
Call make an appointment with your primary care provider if any continued problems or concerns.  Begin taking the Norco every 6 hours as needed for moderate pain.  You may use ice or heat to your back, neck and shoulder area.  You may need to use ice to your foot to reduce swelling and help reduce pain.  X-rays today were negative for any acute bony injury and you will be sore for the next 3 to 4 days even with medication.  Try to move as much as possible to avoid increased stiffness and soreness.

## 2019-07-05 NOTE — ED Triage Notes (Signed)
C/O fall last evening.  Arrives to ED to for evaluation of neck and back pain.  Patient is AAOx3.  Skin warm and dry. NAD. Ambulates with easy and steady gait.
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
## 2019-07-10 ENCOUNTER — Telehealth: Payer: Self-pay

## 2019-07-10 DIAGNOSIS — M79606 Pain in leg, unspecified: Secondary | ICD-10-CM

## 2019-07-10 NOTE — Telephone Encounter (Signed)
Message address in another message.

## 2019-07-10 NOTE — Telephone Encounter (Signed)
You can call and discuss it. She will need to follow up if she is continuing to have any issues from the ER visit.

## 2019-07-10 NOTE — Telephone Encounter (Signed)
Please advise     Source Subject Topic  [name], Margaret Caffey (Patient) Margaret [name] (Patient) General - Other  Summary: [place]  Patient would like a callback from [facility] nurse in regards to medication from her recent fall and er visit. Please advise

## 2019-07-10 NOTE — Telephone Encounter (Signed)
Copied from CRM [id number]. Topic: General - Other >> Jul 10, 2019  2:29 PM Dalphine Handing A wrote: Patient would like a callback from [facility] nurse in regards to medication from her recent fall and er visit. Please advise

## 2019-07-10 NOTE — Telephone Encounter (Signed)
Patient reports she is sore in her neck, shoulder and back and is requesting medication. I advised patient that she will need appointment in order for her to be prescribed any medication and patient agreed. Then patient states that she would like a referral to the chiropractic office due to leg pain as well and I advised patient that she could see if the office that she was referred too would see her again since it was placed this year and if not just call the office back. Patient states she will try that first and see how that goes.FYI

## 2019-07-12 NOTE — Telephone Encounter (Signed)
Chiropractor referral placed.

## 2019-07-12 NOTE — Addendum Note (Signed)
Addended by: Trey Sailors on: 07/12/2019 12:41 PM   Modules accepted: Orders

## 2019-07-13 NOTE — Telephone Encounter (Signed)
Called and informed patient that the referral has been placed. She gave verbal understanding.

## 2019-08-02 ENCOUNTER — Encounter: Payer: Self-pay | Admitting: *Deleted

## 2019-08-17 ENCOUNTER — Encounter: Payer: Self-pay | Admitting: Surgery

## 2019-08-17 ENCOUNTER — Ambulatory Visit (INDEPENDENT_AMBULATORY_CARE_PROVIDER_SITE_OTHER): Payer: Medicaid Other | Admitting: Surgery

## 2019-08-17 ENCOUNTER — Ambulatory Visit: Payer: Self-pay | Admitting: Surgery

## 2019-08-17 ENCOUNTER — Other Ambulatory Visit: Payer: Self-pay

## 2019-08-17 VITALS — BP 118/87 | HR 78 | Temp 97.2°F | Resp 12 | Ht 65.0 in | Wt 240.0 lb

## 2019-08-17 DIAGNOSIS — K436 Other and unspecified ventral hernia with obstruction, without gangrene: Secondary | ICD-10-CM

## 2019-08-17 NOTE — Progress Notes (Signed)
Patient ID: Margaret [name], female   DOB: 09/12/1970, 49 y.o.   MRN: [id number]  Chief Complaint: Ventral hernia  History of Present Illness Margaret [name] is a 49 y.o. female with patient has known supraumbilical bulge for many years.  Associated discomfort, no real pain.  Reports nausea. Denies fevers, denies any issues with bowel activity. She has a desire to become more active and pursue some weight loss/fitness. She had a previous ultrasound of the area in March 2020 which showed a slightly larger than 1 cm neck.  Past Medical History Past Medical History:  Diagnosis Date  . Anxiety   . Asthma    controlled  . CVA (cerebral vascular accident) ([facility])    a. 09/2016  . Depression   . Dysrhythmia   . Hypertension   . Hypothyroidism   . Morbid obesity ([facility])   . PAF (paroxysmal atrial fibrillation) ([facility]) 09/2016   a. 30-day event monitor 6/18: predominant rhythm of sinus with isolated PACs and PVCs.  Two narrow complex tachycardia episodes concerning for A. Fib +/-atrial flutter or SVT.  No prolonged pauses; b. CHADS2VASc = 3 (stroke x 2, female)--> Eliquis.  Marland Kitchen PFO (patent foramen ovale)    a. TTE 6/18: EF of 60 to 65%, normal wall motion, grade 1 diastolic dysfunction; b. TEE 6/18: evidence of a very small atrial level right to left shunt by bubble study without visualization of ASD or PFO  . Substance abuse ([facility])    Past use of cocaine   . Tobacco abuse       Past Surgical History:  Procedure Laterality Date  . FRACTURE SURGERY    . TEE WITHOUT CARDIOVERSION N/A 09/08/2016   Procedure: TRANSESOPHAGEAL ECHOCARDIOGRAM (TEE);  Surgeon: [name], MD;  Location: [facility] ORS;  Service: Cardiovascular;  Laterality: N/A;    No Known Allergies  Current Outpatient Medications  Medication Sig Dispense Refill  . atorvastatin (LIPITOR) 40 MG tablet TAKE 1 TABLET BY MOUTH EVERY DAY AT 6 PM 90 tablet 0  . ciclopirox (PENLAC) 8 % solution Apply 1 application topically daily. Put on toes for 7  days then wipe off    . cyclobenzaprine (FLEXERIL) 5 MG tablet Take 1 tablet (5 mg total) by mouth at bedtime. 30 tablet 0  . ELIQUIS 5 MG TABS tablet TAKE 1 TABLET BY MOUTH TWICE DAILY (Patient taking differently: Take 5 mg by mouth 2 (two) times daily. ) 60 tablet 6  . HYDROcodone-acetaminophen (NORCO/VICODIN) 5-325 MG tablet Take 1 tablet by mouth every 6 (six) hours as needed for moderate pain. 12 tablet 0  . levothyroxine (SYNTHROID) 100 MCG tablet Take 1 tablet (100 mcg total) by mouth daily. (Patient taking differently: Take 125 mcg by mouth daily. ) 90 tablet 3  . metoprolol tartrate (LOPRESSOR) 25 MG tablet Take 1 tablet (25 mg total) by mouth 2 (two) times daily. 180 tablet 1  . Multiple Vitamins-Minerals (MULTIVITAMIN WITH MINERALS) tablet Take 1 tablet by mouth daily.    . prazosin (MINIPRESS) 2 MG capsule Take 2 mg by mouth daily as needed (Sleep). In the evening    . REXULTI 2 MG TABS tablet Take 2 mg by mouth daily.    Marland Kitchen triamcinolone cream (KENALOG) 0.1 % Apply 1 application topically 2 (two) times daily. 30 g 0  . valACYclovir (VALTREX) 1000 MG tablet Take 1 tablet (1,000 mg total) by mouth 2 (two) times daily. 60 tablet 0  . sertraline (ZOLOFT) 100 MG tablet Take 1 tablet (100 mg total)  by mouth daily. (Patient taking differently: Take 25 mg by mouth daily. ) 90 tablet 1   No current facility-administered medications for this visit.    Family History Family History  Problem Relation Age of Onset  . Stroke Father       Social History Social History   Tobacco Use  . Smoking status: Current Every Day Smoker    Packs/day: 0.25    Types: Cigarettes  . Smokeless tobacco: Former Neurosurgeon    Types: Snuff  . Tobacco comment: trying to quit; going to start using patches;   Substance Use Topics  . Alcohol use: Not Currently    Alcohol/week: 0.0 standard drinks    Comment: on occasion  . Drug use: Not Currently    Types: Marijuana    Comment: Previously, but stopped years ago         Review of Systems  Constitutional: Negative.   HENT: Negative for ear pain, sinus pain and sore throat.   Eyes: Positive for blurred vision.  Respiratory: Negative.  Negative for cough and shortness of breath.   Cardiovascular: Negative.  Negative for chest pain.  Gastrointestinal: Negative for abdominal pain, blood in stool, constipation, diarrhea, heartburn, melena, nausea and vomiting.  Musculoskeletal: Positive for joint pain.  Skin: Negative for itching and rash.  Neurological: Negative for seizures.  Endo/Heme/Allergies: Negative.       Physical Exam Blood pressure 118/87, pulse 78, temperature (!) 97.2 F (36.2 C), resp. rate 12, height 5\' 5"  (1.651 m), weight 240 lb (108.9 kg), last menstrual period 05/07/2016, SpO2 97 %. Last Weight  Most recent update: 08/17/2019  2:00 PM   Weight  108.9 kg (240 lb)            CONSTITUTIONAL: Well developed, and nourished, appropriately responsive and aware without distress.   EYES: Sclera non-icteric.   EARS, NOSE, MOUTH AND THROAT: Mask worn.    Hearing is intact to voice.  NECK: Trachea is midline, and there is no jugular venous distension.  LYMPH NODES:  Lymph nodes in the neck are not enlarged. RESPIRATORY:  Lungs are clear, and breath sounds are equal bilaterally. Normal respiratory effort without pathologic use of accessory muscles. CARDIOVASCULAR: Heart is regular in rate and rhythm. GI: The abdomen is soft, nontender, and nondistended. There is a palpable 2 to 2.5 cm mass consistent with the protruding adipose tissue via the 1 cm plus fascial neck. No other palpable masses. I did not appreciate hepatosplenomegaly. There were normal bowel sounds. MUSCULOSKELETAL:  Symmetrical muscle tone appreciated in all four extremities.    SKIN: Skin turgor is normal. No pathologic skin lesions appreciated.  NEUROLOGIC:  Motor and sensation appear grossly normal.  Cranial nerves are grossly without defect. PSYCH:  Alert and oriented  to person, place and time. Affect is appropriate for situation.  Data Reviewed I have personally reviewed what is currently available of the patient's imaging, recent labs and medical records.   Labs:  CBC Latest Ref Rng & Units 05/30/2019 06/17/2018 02/24/2018  WBC 4.0 - 10.5 K/uL 12.2(H) 15.2(H) 10.1  Hemoglobin 12.0 - 15.0 g/dL 02/26/2018 67.6 19.5  Hematocrit 36.0 - 46.0 % 37.4 39.4 34.9  Platelets 150 - 400 K/uL 367 366 358   CMP Latest Ref Rng & Units 05/30/2019 06/17/2018 02/24/2018  Glucose 70 - 99 mg/dL 83 02/26/2018) 90  BUN 6 - 20 mg/dL 10 6 10   Creatinine 0.44 - 1.00 mg/dL 267(T 2.45  Sodium 135 - 145 mmol/L 139 141 143  Potassium 3.5 - 5.1 mmol/L 3.4(L) 3.3(L) 4.0  Chloride 98 - 111 mmol/L 102 106 105  CO2 22 - 32 mmol/L 30 27 21   Calcium 8.9 - 10.3 mg/dL 9.3 9.0 9.2  Total Protein 6.5 - 8.1 g/dL - 7.2 6.8  Total Bilirubin 0.3 - 1.2 mg/dL - 0.8 0.3  Alkaline Phos 38 - 126 U/L - 92 111  AST 15 - 41 U/L - 19 18  ALT 0 - 44 U/L - 18 14      Imaging: Radiology review: Ultrasound images from March 2020. Within last 24 hrs: No results found.  Assessment    Epigastric/supraumbilical hernia. (Ventral hernia)/not incisional. Patient Active Problem List   Diagnosis Date Noted  . Dyspnea on exertion 08/15/2018  . Paroxysmal atrial fibrillation ([place]) 10/29/2016  . PFO (patent foramen ovale) 09/23/2016  . Hyperlipidemia 09/17/2016  . IFG (impaired fasting glucose) 09/17/2016  . History of stroke 09/06/2016  . Myopia 12/20/2014  . Abdominal pain 12/20/2014  . Depression 12/20/2014  . Right arm pain 11/21/2014  . Thyroid activity decreased 10/18/2014  . Abnormal CBC 10/18/2014  . Tobacco abuse 10/18/2014    Plan    Open ventral hernia repair, possibly with mesh.  We will need to hold her Eliquis for 72 hrs/or 3 days preop.  I discussed possibility of incarceration, strangulation, enlargement in size over time, and the risk of emergency surgery in the face of  strangulation.  Also discussed the risk of surgery including recurrence which can be up to 30% in the case of complex hernias, use of prosthetic materials (mesh) and the increased risk of infxn, post-op infxn and the possible need for re-operation and removal of mesh if used, possibility of post-op SBO or ileus, and the risks of general anesthetic including MI, CVA, sudden death or even reaction to anesthetic medications. The patient and family understands the risks, any and all questions were answered to the patient's satisfaction.   Face-to-face time spent with the patient and accompanying care providers(if present) was 30 minutes, with more than 50% of the time spent counseling, educating, and coordinating care of the patient.      Ronny Bacon M.D., FACS 08/17/2019, 4:05 PM

## 2019-08-17 NOTE — H&P (View-Only) (Signed)
Patient ID: Margaret [name], female   DOB: 07/23/1970, 48 y.o.   MRN: [id number]  Chief Complaint: Ventral hernia  History of Present Illness Margaret [name] is a 48 y.o. female with patient has known supraumbilical bulge for many years.  Associated discomfort, no real pain.  Reports nausea. Denies fevers, denies any issues with bowel activity. She has a desire to become more active and pursue some weight loss/fitness. She had a previous ultrasound of the area in March 2020 which showed a slightly larger than 1 cm neck.  Past Medical History Past Medical History:  Diagnosis Date  . Anxiety   . Asthma    controlled  . CVA (cerebral vascular accident) (HCC)    a. 09/2016  . Depression   . Dysrhythmia   . Hypertension   . Hypothyroidism   . Morbid obesity (HCC)   . PAF (paroxysmal atrial fibrillation) (HCC) 09/2016   a. 30-day event monitor 6/18: predominant rhythm of sinus with isolated PACs and PVCs.  Two narrow complex tachycardia episodes concerning for A. Fib +/-atrial flutter or SVT.  No prolonged pauses; b. CHADS2VASc = 3 (stroke x 2, female)--> Eliquis.  . PFO (patent foramen ovale)    a. TTE 6/18: EF of 60 to 65%, normal wall motion, grade 1 diastolic dysfunction; b. TEE 6/18: evidence of a very small atrial level right to left shunt by bubble study without visualization of ASD or PFO  . Substance abuse (HCC)    Past use of cocaine   . Tobacco abuse       Past Surgical History:  Procedure Laterality Date  . FRACTURE SURGERY    . TEE WITHOUT CARDIOVERSION N/A 09/08/2016   Procedure: TRANSESOPHAGEAL ECHOCARDIOGRAM (TEE);  Surgeon: End, Christopher, MD;  Location: [facility] ORS;  Service: Cardiovascular;  Laterality: N/A;    No Known Allergies  Current Outpatient Medications  Medication Sig Dispense Refill  . atorvastatin (LIPITOR) 40 MG tablet TAKE 1 TABLET BY MOUTH EVERY DAY AT 6 PM 90 tablet 0  . ciclopirox (PENLAC) 8 % solution Apply 1 application topically daily. Put on toes for 7  days then wipe off    . cyclobenzaprine (FLEXERIL) 5 MG tablet Take 1 tablet (5 mg total) by mouth at bedtime. 30 tablet 0  . ELIQUIS 5 MG TABS tablet TAKE 1 TABLET BY MOUTH TWICE DAILY (Patient taking differently: Take 5 mg by mouth 2 (two) times daily. ) 60 tablet 6  . HYDROcodone-acetaminophen (NORCO/VICODIN) 5-325 MG tablet Take 1 tablet by mouth every 6 (six) hours as needed for moderate pain. 12 tablet 0  . levothyroxine (SYNTHROID) 100 MCG tablet Take 1 tablet (100 mcg total) by mouth daily. (Patient taking differently: Take 125 mcg by mouth daily. ) 90 tablet 3  . metoprolol tartrate (LOPRESSOR) 25 MG tablet Take 1 tablet (25 mg total) by mouth 2 (two) times daily. 180 tablet 1  . Multiple Vitamins-Minerals (MULTIVITAMIN WITH MINERALS) tablet Take 1 tablet by mouth daily.    . prazosin (MINIPRESS) 2 MG capsule Take 2 mg by mouth daily as needed (Sleep). In the evening    . REXULTI 2 MG TABS tablet Take 2 mg by mouth daily.    . triamcinolone cream (KENALOG) 0.1 % Apply 1 application topically 2 (two) times daily. 30 g 0  . valACYclovir (VALTREX) 1000 MG tablet Take 1 tablet (1,000 mg total) by mouth 2 (two) times daily. 60 tablet 0  . sertraline (ZOLOFT) 100 MG tablet Take 1 tablet (100 mg total)   by mouth daily. (Patient taking differently: Take 25 mg by mouth daily. ) 90 tablet 1   No current facility-administered medications for this visit.    Family History Family History  Problem Relation Age of Onset  . Stroke Father       Social History Social History   Tobacco Use  . Smoking status: Current Every Day Smoker    Packs/day: 0.25    Types: Cigarettes  . Smokeless tobacco: Former Neurosurgeon    Types: Snuff  . Tobacco comment: trying to quit; going to start using patches;   Substance Use Topics  . Alcohol use: Not Currently    Alcohol/week: 0.0 standard drinks    Comment: on occasion  . Drug use: Not Currently    Types: Marijuana    Comment: Previously, but stopped years ago         Review of Systems  Constitutional: Negative.   HENT: Negative for ear pain, sinus pain and sore throat.   Eyes: Positive for blurred vision.  Respiratory: Negative.  Negative for cough and shortness of breath.   Cardiovascular: Negative.  Negative for chest pain.  Gastrointestinal: Negative for abdominal pain, blood in stool, constipation, diarrhea, heartburn, melena, nausea and vomiting.  Musculoskeletal: Positive for joint pain.  Skin: Negative for itching and rash.  Neurological: Negative for seizures.  Endo/Heme/Allergies: Negative.       Physical Exam Blood pressure 118/87, pulse 78, temperature (!) 97.2 F (36.2 C), resp. rate 12, height 5\' 5"  (1.651 m), weight 240 lb (108.9 kg), last menstrual period 05/07/2016, SpO2 97 %. Last Weight  Most recent update: 08/17/2019  2:00 PM   Weight  108.9 kg (240 lb)            CONSTITUTIONAL: Well developed, and nourished, appropriately responsive and aware without distress.   EYES: Sclera non-icteric.   EARS, NOSE, MOUTH AND THROAT: Mask worn.    Hearing is intact to voice.  NECK: Trachea is midline, and there is no jugular venous distension.  LYMPH NODES:  Lymph nodes in the neck are not enlarged. RESPIRATORY:  Lungs are clear, and breath sounds are equal bilaterally. Normal respiratory effort without pathologic use of accessory muscles. CARDIOVASCULAR: Heart is regular in rate and rhythm. GI: The abdomen is soft, nontender, and nondistended. There is a palpable 2 to 2.5 cm mass consistent with the protruding adipose tissue via the 1 cm plus fascial neck. No other palpable masses. I did not appreciate hepatosplenomegaly. There were normal bowel sounds. MUSCULOSKELETAL:  Symmetrical muscle tone appreciated in all four extremities.    SKIN: Skin turgor is normal. No pathologic skin lesions appreciated.  NEUROLOGIC:  Motor and sensation appear grossly normal.  Cranial nerves are grossly without defect. PSYCH:  Alert and oriented  to person, place and time. Affect is appropriate for situation.  Data Reviewed I have personally reviewed what is currently available of the patient's imaging, recent labs and medical records.   Labs:  CBC Latest Ref Rng & Units 05/30/2019 06/17/2018 02/24/2018  WBC 4.0 - 10.5 K/uL 12.2(H) 15.2(H) 10.1  Hemoglobin 12.0 - 15.0 g/dL 02/26/2018 67.6 19.5  Hematocrit 36.0 - 46.0 % 37.4 39.4 34.9  Platelets 150 - 400 K/uL 367 366 358   CMP Latest Ref Rng & Units 05/30/2019 06/17/2018 02/24/2018  Glucose 70 - 99 mg/dL 83 02/26/2018) 90  BUN 6 - 20 mg/dL 10 6 10   Creatinine 0.44 - 1.00 mg/dL 267(T 2.45  Sodium 135 - 145 mmol/L 139 141 143  Potassium 3.5 - 5.1 mmol/L 3.4(L) 3.3(L) 4.0  Chloride 98 - 111 mmol/L 102 106 105  CO2 22 - 32 mmol/L 30 27 21   Calcium 8.9 - 10.3 mg/dL 9.3 9.0 9.2  Total Protein 6.5 - 8.1 g/dL - 7.2 6.8  Total Bilirubin 0.3 - 1.2 mg/dL - 0.8 0.3  Alkaline Phos 38 - 126 U/L - 92 111  AST 15 - 41 U/L - 19 18  ALT 0 - 44 U/L - 18 14      Imaging: Radiology review: Ultrasound images from March 2020. Within last 24 hrs: No results found.  Assessment    Epigastric/supraumbilical hernia. (Ventral hernia)/not incisional. Patient Active Problem List   Diagnosis Date Noted  . Dyspnea on exertion 08/15/2018  . Paroxysmal atrial fibrillation ([place]) 10/29/2016  . PFO (patent foramen ovale) 09/23/2016  . Hyperlipidemia 09/17/2016  . IFG (impaired fasting glucose) 09/17/2016  . History of stroke 09/06/2016  . Myopia 12/20/2014  . Abdominal pain 12/20/2014  . Depression 12/20/2014  . Right arm pain 11/21/2014  . Thyroid activity decreased 10/18/2014  . Abnormal CBC 10/18/2014  . Tobacco abuse 10/18/2014    Plan    Open ventral hernia repair, possibly with mesh.  We will need to hold her Eliquis for 72 hrs/or 3 days preop.  I discussed possibility of incarceration, strangulation, enlargement in size over time, and the risk of emergency surgery in the face of  strangulation.  Also discussed the risk of surgery including recurrence which can be up to 30% in the case of complex hernias, use of prosthetic materials (mesh) and the increased risk of infxn, post-op infxn and the possible need for re-operation and removal of mesh if used, possibility of post-op SBO or ileus, and the risks of general anesthetic including MI, CVA, sudden death or even reaction to anesthetic medications. The patient and family understands the risks, any and all questions were answered to the patient's satisfaction.   Face-to-face time spent with the patient and accompanying care providers(if present) was 30 minutes, with more than 50% of the time spent counseling, educating, and coordinating care of the patient.      Ronny Bacon M.D., FACS 08/17/2019, 4:05 PM

## 2019-08-17 NOTE — Patient Instructions (Addendum)
Once you have a surgery date, please STOP taking Eliquis 2 days prior to surgery. They will let you know when to resume Eliquis after surgery.   Our surgery scheduler will contact you to schedule your surgery. Please have the blue sheet available when she calls you.   Call the office if you have any questions or concerns.  Ventral Hernia  A ventral hernia is a bulge of tissue from inside the abdomen that pushes through a weak area of the muscles that form the front wall of the abdomen. The tissues inside the abdomen are inside a sac (peritoneum). These tissues include the small intestine, large intestine, and the fatty tissue that covers the intestines (omentum). Sometimes, the bulge that forms a hernia contains intestines. Other hernias contain only fat. Ventral hernias do not go away without surgical treatment. There are several types of ventral hernias. You may have:  A hernia at an incision site from previous abdominal surgery (incisional hernia).  A hernia just above the belly button (epigastric hernia), or at the belly button (umbilical hernia). These types of hernias can develop from heavy lifting or straining.  A hernia that comes and goes (reducible hernia). It may be visible only when you lift or strain. This type of hernia can be pushed back into the abdomen (reduced).  A hernia that traps abdominal tissue inside the hernia (incarcerated hernia). This type of hernia does not reduce.  A hernia that cuts off blood flow to the tissues inside the hernia (strangulated hernia). The tissues can start to die if this happens. This is a very painful bulge that cannot be reduced. A strangulated hernia is a medical emergency. What are the causes? This condition is caused by abdominal tissue putting pressure on an area of weakness in the abdominal muscles. What increases the risk? The following factors may make you more likely to develop this condition:  Being female.  Being 88 or  older.  Being overweight or obese.  Having had previous abdominal surgery, especially if there was an infection after surgery.  Having had an injury to the abdominal wall.  Having had several pregnancies.  Having a buildup of fluid inside the abdomen (ascites). What are the signs or symptoms? The only symptom of a ventral hernia may be a painless bulge in the abdomen. A reducible hernia may be visible only when you strain, cough, or lift. Other symptoms may include:  Dull pain.  A feeling of pressure. Signs and symptoms of a strangulated hernia may include:  Increasing pain.  Nausea and vomiting.  Pain when pressing on the hernia.  The skin over the hernia turning red or purple.  Constipation.  Blood in the stool (feces). How is this diagnosed? This condition may be diagnosed based on:  Your symptoms.  Your medical history.  A physical exam. You may be asked to cough or strain while standing. These actions increase the pressure inside your abdomen and force the hernia through the opening in your muscles. Your health care provider may try to reduce the hernia by pressing on it.  Imaging studies, such as an ultrasound or CT scan. How is this treated? This condition is treated with surgery. If you have a strangulated hernia, surgery is done as soon as possible. If your hernia is small and not incarcerated, you may be asked to lose some weight before surgery. Follow these instructions at home:  Follow instructions from your health care provider about eating or drinking restrictions.  If you are  overweight, your health care provider may recommend that you increase your activity level and eat a healthier diet.  Do not lift anything that is heavier than 10 lb (4.5 kg).  Return to your normal activities as told by your health care provider. Ask your health care provider what activities are safe for you. You may need to avoid activities that increase pressure on your  hernia.  Take over-the-counter and prescription medicines only as told by your health care provider.  Keep all follow-up visits as told by your health care provider. This is important. Contact a health care provider if:  Your hernia gets larger.  Your hernia becomes painful. Get help right away if:  Your hernia becomes increasingly painful.  You have pain along with any of the following: ? Changes in skin color in the area of the hernia. ? Nausea. ? Vomiting. ? Fever. Summary  A ventral hernia is a bulge of tissue from inside the abdomen that pushes through a weak area of the muscles that form the front wall of the abdomen.  This condition is treated with surgery, which may be urgent depending on your hernia.  Do not lift anything that is heavier than 10 lb (4.5 kg), and follow activity instructions from your health care provider. This information is not intended to replace advice given to you by your health care provider. Make sure you discuss any questions you have with your health care provider. Document Revised: 05/05/2017 Document Reviewed: 10/12/2016 Elsevier Patient Education  [place].

## 2019-08-18 ENCOUNTER — Telehealth: Payer: Self-pay | Admitting: Surgery

## 2019-08-18 NOTE — Telephone Encounter (Signed)
Received call back from patient, she is aware of all dates regarding her surgery and voices understanding.

## 2019-08-18 NOTE — Telephone Encounter (Signed)
Outgoing call is made, left message for patient to call.  Please advise of Pre-Admission date/time, COVID Testing date and Surgery date.  Surgery Date: 08/30/19 Preadmission Testing Date: 08/24/19 (phone 8a-1p) Covid Testing Date: 08/28/19 - patient advised to go to [facility] ([address] [facility]) between 8a-1p  Patient also needs to call [phone], between 1-3:00pm the day before surgery, to find out what time to arrive for surgery.

## 2019-08-22 ENCOUNTER — Emergency Department
Admission: EM | Admit: 2019-08-22 | Discharge: 2019-08-22 | Disposition: A | Discharge status: Home / Self Care | Payer: Medicaid Other | Attending: Emergency Medicine | Admitting: Emergency Medicine

## 2019-08-22 ENCOUNTER — Emergency Department: Payer: Medicaid Other

## 2019-08-22 ENCOUNTER — Other Ambulatory Visit: Payer: Self-pay

## 2019-08-22 ENCOUNTER — Ambulatory Visit: Payer: Self-pay

## 2019-08-22 ENCOUNTER — Emergency Department
Admission: EM | Admit: 2019-08-22 | Discharge: 2019-08-22 | Discharge status: Absent without leave | Payer: Medicaid Other

## 2019-08-22 ENCOUNTER — Telehealth: Payer: Self-pay | Admitting: Internal Medicine

## 2019-08-22 DIAGNOSIS — R519 Headache, unspecified: Secondary | ICD-10-CM | POA: Insufficient documentation

## 2019-08-22 DIAGNOSIS — Z7901 Long term (current) use of anticoagulants: Secondary | ICD-10-CM | POA: Diagnosis not present

## 2019-08-22 DIAGNOSIS — Z79899 Other long term (current) drug therapy: Secondary | ICD-10-CM | POA: Diagnosis not present

## 2019-08-22 DIAGNOSIS — I10 Essential (primary) hypertension: Secondary | ICD-10-CM | POA: Insufficient documentation

## 2019-08-22 DIAGNOSIS — E039 Hypothyroidism, unspecified: Secondary | ICD-10-CM | POA: Insufficient documentation

## 2019-08-22 DIAGNOSIS — F17210 Nicotine dependence, cigarettes, uncomplicated: Secondary | ICD-10-CM | POA: Diagnosis not present

## 2019-08-22 LAB — POCT PREGNANCY, URINE: Preg Test, Ur: NEGATIVE

## 2019-08-22 MED ORDER — METHOCARBAMOL 500 MG PO TABS: 500.0000 mg | ORAL_TABLET | Freq: Three times a day (TID) | ORAL | 0 refills | Status: AC | PRN

## 2019-08-22 MED ORDER — BUTALBITAL-APAP-CAFFEINE 50-325-40 MG PO TABS: 1.0000 | ORAL_TABLET | Freq: Four times a day (QID) | ORAL | 0 refills | Status: AC | PRN

## 2019-08-22 NOTE — ED Provider Notes (Signed)
Emergency Department Provider Note  ____________________________________________  Time seen: Approximately 8:52 PM  I have reviewed the triage vital signs and the nursing notes.   HISTORY  Chief Complaint Headache   Historian Patient     HPI Margaret [name] is a 49 y.o. female presents to the emergency department with left-sided frontal headache.  Patient states that she has had upper back pain that reproduces when she palpates the muscles and some neck discomfort.  Patient does state that she has had some tingling in her left hand when she moves her neck.  Patient states that she has a history of "mini strokes" and wanted to make sure everything is okay.  Patient states that she does not want to be treated for headache while in the emergency department and she will pick up prescriptions on the way home.  She denies weakness of the upper extremities.  No chest pain, chest tightness or abdominal pain.  Patient would like a work note.   Past Medical History:  Diagnosis Date  . Anxiety   . Asthma    controlled  . CVA (cerebral vascular accident) (HCC)    a. 09/2016  . Depression   . Dysrhythmia   . Hypertension   . Hypothyroidism   . Morbid obesity (HCC)   . PAF (paroxysmal atrial fibrillation) (HCC) 09/2016   a. 30-day event monitor 6/18: predominant rhythm of sinus with isolated PACs and PVCs.  Two narrow complex tachycardia episodes concerning for A. Fib +/-atrial flutter or SVT.  No prolonged pauses; b. CHADS2VASc = 3 (stroke x 2, female)--> Eliquis.  Marland Kitchen PFO (patent foramen ovale)    a. TTE 6/18: EF of 60 to 65%, normal wall motion, grade 1 diastolic dysfunction; b. TEE 6/18: evidence of a very small atrial level right to left shunt by bubble study without visualization of ASD or PFO  . Substance abuse (HCC)    Past use of cocaine   . Tobacco abuse      Immunizations up to date:  Yes.     Past Medical History:  Diagnosis Date  . Anxiety   . Asthma    controlled  .  CVA (cerebral vascular accident) (HCC)    a. 09/2016  . Depression   . Dysrhythmia   . Hypertension   . Hypothyroidism   . Morbid obesity (HCC)   . PAF (paroxysmal atrial fibrillation) (HCC) 09/2016   a. 30-day event monitor 6/18: predominant rhythm of sinus with isolated PACs and PVCs.  Two narrow complex tachycardia episodes concerning for A. Fib +/-atrial flutter or SVT.  No prolonged pauses; b. CHADS2VASc = 3 (stroke x 2, female)--> Eliquis.  Marland Kitchen PFO (patent foramen ovale)    a. TTE 6/18: EF of 60 to 65%, normal wall motion, grade 1 diastolic dysfunction; b. TEE 6/18: evidence of a very small atrial level right to left shunt by bubble study without visualization of ASD or PFO  . Substance abuse (HCC)    Past use of cocaine   . Tobacco abuse     Patient Active Problem List   Diagnosis Date Noted  . Dyspnea on exertion 08/15/2018  . Paroxysmal atrial fibrillation (HCC) 10/29/2016  . PFO (patent foramen ovale) 09/23/2016  . Hyperlipidemia 09/17/2016  . IFG (impaired fasting glucose) 09/17/2016  . History of stroke 09/06/2016  . Myopia 12/20/2014  . Abdominal pain 12/20/2014  . Depression 12/20/2014  . Right arm pain 11/21/2014  . Thyroid activity decreased 10/18/2014  . Abnormal CBC 10/18/2014  .  Tobacco abuse 10/18/2014    Past Surgical History:  Procedure Laterality Date  . FRACTURE SURGERY    . TEE WITHOUT CARDIOVERSION N/A 09/08/2016   Procedure: TRANSESOPHAGEAL ECHOCARDIOGRAM (TEE);  Surgeon: [name], MD;  Location: [facility] ORS;  Service: Cardiovascular;  Laterality: N/A;    Prior to Admission medications   Medication Sig Start Date End Date Taking? Authorizing Provider  atorvastatin (LIPITOR) 40 MG tablet TAKE 1 TABLET BY MOUTH EVERY DAY AT 6 PM 06/02/19   Dunn, Raymon Mutton, PA-C  butalbital-acetaminophen-caffeine (FIORICET) [phone] MG tablet Take 1-2 tablets by mouth every 6 (six) hours as needed for up to 4 days for headache. 08/22/19 08/26/19  Orvil Feil, PA-C   ciclopirox [facility]) 8 % solution Apply 1 application topically daily. Put on toes for 7 days then wipe off 05/01/19   [provider]  cyclobenzaprine (FLEXERIL) 5 MG tablet Take 1 tablet (5 mg total) by mouth at bedtime. 11/10/18   Pollak, Adriana M, PA-C  ELIQUIS 5 MG TABS tablet TAKE 1 TABLET BY MOUTH TWICE DAILY Patient taking differently: Take 5 mg by mouth 2 (two) times daily.  11/02/18   End, Cristal Deer, MD  HYDROcodone-acetaminophen (NORCO/VICODIN) 5-325 MG tablet Take 1 tablet by mouth every 6 (six) hours as needed for moderate pain. 07/05/19   Tommi Rumps, PA-C  levothyroxine (SYNTHROID) 100 MCG tablet Take 1 tablet (100 mcg total) by mouth daily. Patient taking differently: Take 125 mcg by mouth daily.  01/04/19   Trey Sailors, PA-C  methocarbamol (ROBAXIN) 500 MG tablet Take 1 tablet (500 mg total) by mouth every 8 (eight) hours as needed for up to 5 days. 08/22/19 08/27/19  Orvil Feil, PA-C  metoprolol tartrate (LOPRESSOR) 25 MG tablet Take 1 tablet (25 mg total) by mouth 2 (two) times daily. 05/30/19 05/29/20  Creig Hines, NP  Multiple Vitamins-Minerals (MULTIVITAMIN WITH MINERALS) tablet Take 1 tablet by mouth daily.    [provider]  prazosin (MINIPRESS) 2 MG capsule Take 2 mg by mouth daily as needed (Sleep). In the evening    [provider]  REXULTI 2 MG TABS tablet Take 2 mg by mouth daily. 05/22/19   [provider]  sertraline (ZOLOFT) 100 MG tablet Take 1 tablet (100 mg total) by mouth daily. Patient taking differently: Take 25 mg by mouth daily.  11/21/18 05/23/19  Trey Sailors, PA-C  triamcinolone cream (KENALOG) 0.1 % Apply 1 application topically 2 (two) times daily. 06/30/19   Trey Sailors, PA-C  valACYclovir (VALTREX) 1000 MG tablet Take 1 tablet (1,000 mg total) by mouth 2 (two) times daily. 11/10/18   Trey Sailors, PA-C    Allergies Patient has no known allergies.  Family History  Problem  Relation Age of Onset  . Stroke Father     Social History Social History   Tobacco Use  . Smoking status: Current Every Day Smoker    Packs/day: 0.25    Types: Cigarettes  . Smokeless tobacco: Former Neurosurgeon    Types: Snuff  . Tobacco comment: trying to quit; going to start using patches;   Substance Use Topics  . Alcohol use: Not Currently    Alcohol/week: 0.0 standard drinks    Comment: on occasion  . Drug use: Not Currently    Types: Marijuana    Comment: Previously, but stopped years ago     Review of Systems  Constitutional: No fever/chills Eyes:  No discharge ENT: No upper respiratory complaints. Respiratory: no cough.  No SOB/ use of accessory muscles to breath Gastrointestinal:   No nausea, no vomiting.  No diarrhea.  No constipation. Musculoskeletal: Negative for musculoskeletal pain. Headache: Patient has headache.  Skin: Negative for rash, abrasions, lacerations, ecchymosis.   ____________________________________________   PHYSICAL EXAM:  VITAL SIGNS: ED Triage Vitals  Enc Vitals Group     BP 08/22/19 1649 119/80     Pulse Rate 08/22/19 1649 88     Resp 08/22/19 1649 16     Temp 08/22/19 1649 98.8 F (37.1 C)     Temp Source 08/22/19 1649 Oral     SpO2 08/22/19 1649 97 %     Weight 08/22/19 1650 240 lb (108.9 kg)     Height 08/22/19 1650 5\' 5"  (1.651 m)     Head Circumference --      Peak Flow --      Pain Score 08/22/19 1650 8     Pain Loc --      Pain Edu? --      Excl. in [place]? --      Constitutional: Alert and oriented. Well appearing and in no acute distress. Eyes: Conjunctivae are normal. PERRL. EOMI. Head: Atraumatic. ENT:      Nose: No congestion/rhinnorhea.      Mouth/Throat: Mucous membranes are moist.  Neck: No stridor.  Full range of motion.  Patient has paraspinal muscle tenderness along the cervical spine and the upper trapezius. Cardiovascular: Normal rate, regular rhythm. Normal S1 and S2.  Good peripheral  circulation. Respiratory: Normal respiratory effort without tachypnea or retractions. Lungs CTAB. Good air entry to the bases with no decreased or absent breath sounds Gastrointestinal: Bowel sounds x 4 quadrants. Soft and nontender to palpation. No guarding or rigidity. No distention. Musculoskeletal: Full range of motion to all extremities. No obvious deformities noted Neurologic:  Normal for age. No gross focal neurologic deficits are appreciated.  Skin:  Skin is warm, dry and intact. No rash noted. Psychiatric: Mood and affect are normal for age. Speech and behavior are normal.   ____________________________________________   LABS (all labs ordered are listed, but only abnormal results are displayed)  Labs Reviewed  POC URINE PREG, ED  POCT PREGNANCY, URINE   ____________________________________________  EKG   ____________________________________________  RADIOLOGY I, Lannie Fields, personally viewed and evaluated these images (plain radiographs) as part of my medical decision making, as well as reviewing the written report by the radiologist.  CT Head Wo Contrast  Result Date: 08/22/2019 CLINICAL DATA:  Headache EXAM: CT HEAD WITHOUT CONTRAST TECHNIQUE: Contiguous axial images were obtained from the base of the skull through the vertex without intravenous contrast. COMPARISON:  01/01/2018 FINDINGS: Brain: No acute intracranial abnormality. Specifically, no hemorrhage, hydrocephalus, mass lesion, acute infarction, or significant intracranial injury. Vascular: No hyperdense vessel or unexpected calcification. Skull: No acute calvarial abnormality. Sinuses/Orbits: Visualized paranasal sinuses and mastoids clear. Orbital soft tissues unremarkable. Other: None IMPRESSION: No acute intracranial abnormality. Electronically Signed   By: Rolm Baptise M.D.   On: 08/22/2019 18:22    ____________________________________________    PROCEDURES  Procedure(s) performed:      Procedures     Medications - No data to display   ____________________________________________   INITIAL IMPRESSION / ASSESSMENT AND PLAN / ED COURSE  Pertinent labs & imaging results that were available during my care of the patient were reviewed by me and considered in my medical decision making (see chart for details).      Assessment and plan Headache:  49 year old  female presents to the emergency department with headache and paraspinal muscle tenderness along the cervical spine and upper trapezius.  Vital signs were reassuring at triage.  On physical exam, patient was resting on her stomach and seemed to be comfortable using her cell phone.  CT head revealed no evidence of intracranial bleed or other acute intracranial abnormality.  I offered to treat patient's headache in the emergency department and she stated that she just needed a prescription for headache to be picked up at her pharmacy.  She was discharged with Fioricet and with Robaxin.  Return precautions were given to return with new or worsening symptoms.  A work note was provided at her request.   ____________________________________________  FINAL CLINICAL IMPRESSION(S) / ED DIAGNOSES  Final diagnoses:  Acute nonintractable headache, unspecified headache type      NEW MEDICATIONS STARTED DURING THIS VISIT:  ED Discharge Orders         Ordered    methocarbamol (ROBAXIN) 500 MG tablet  Every 8 hours PRN     08/22/19 1855    butalbital-acetaminophen-caffeine (FIORICET) 50-325-40 MG tablet  Every 6 hours PRN     08/22/19 1855              This chart was dictated using voice recognition software/Dragon. Despite best efforts to proofread, errors can occur which can change the meaning. Any change was purely unintentional.     Gasper Lloyd 08/22/19 1025    Phineas Semen, MD 08/22/19 2138

## 2019-08-22 NOTE — ED Triage Notes (Signed)
Pt to the er for headache to the left side of the head and to the center. Pt also feels as if she has pain in the left shoulder and numbness from the left elbow to the fingers. Pt reports that she had a mini stroke 4 years ago. Pt reports if she does not lay on the left side she has pt states she has been taking aleve. Pt did not take her meds this am.

## 2019-08-22 NOTE — ED Notes (Signed)
POC preg is negative.

## 2019-08-22 NOTE — Telephone Encounter (Signed)
Pt c/o of Chest Pain: STAT if CP now or developed within 24 hours  1. Are you having CP right now? A little bit  2. Are you experiencing any other symptoms (ex. SOB, nausea, vomiting, sweating)? Pain in shoulder into elbow and into fingers on left arm, vomiting a couple days ago, tired and weak  3. How long have you been experiencing CP? Couple days  4. Is your CP continuous or coming and going? Comes and goes  5. Have you taken Nitroglycerin? no ?

## 2019-08-22 NOTE — Telephone Encounter (Signed)
Patient calling back. Says she chose not to stay at the Emergency room but decided to go to Urgent Care instead. She is almost to Urgent Care. Says she wanted to know what we thought she should do and I advised she should go to the Emergency room for evaluation. States she is almost to Urgent Care and will go there instead.  Advised strongly she needed emergent medical evaluation and she verbalized understanding.

## 2019-08-22 NOTE — Telephone Encounter (Signed)
Attempted to reach patient but no answer. Looks like patient is in the ED. Left message to call back if any further evaluation is needed and otherwise to continue with the emergency room at this time.

## 2019-08-22 NOTE — Telephone Encounter (Signed)
Pt. Reports she started having headaches, blurred vision and numbness to both arms 1 week ago. States she has had a stroke 4 years ago. At home now and having blurred vision now. Instructed to call 911. Refuses. States "I'll go to the ED in a little bit." Instructed not to drive. Verbalizes understanding.  Reason for Disposition . Headache  (and neurologic deficit)  Answer Assessment - Initial Assessment Questions 1. SYMPTOM: "What is the main symptom you are concerned about?" (e.g., weakness, numbness)     Headaches, arms feel numb, vision changes 2. ONSET: "When did this start?" (minutes, hours, days; while sleeping)     1 week ago 3. LAST NORMAL: "When was the last time you were normal (no symptoms)?"     More than a week ago 4. PATTERN "Does this come and go, or has it been constant since it started?"  "Is it present now?"     Comes and goes 5. CARDIAC SYMPTOMS: "Have you had any of the following symptoms: chest pain, difficulty breathing, palpitations?"     No 6. NEUROLOGIC SYMPTOMS: "Have you had any of the following symptoms: headache, dizziness, vision loss, double vision, changes in speech, unsteady on your feet?"     Headache, vision change - blurry 7. OTHER SYMPTOMS: "Do you have any other symptoms?"     Vomiting  8. PREGNANCY: "Is there any chance you are pregnant?" "When was your last menstrual period?"     No  Protocols used: NEUROLOGIC DEFICIT-A-AH

## 2019-08-22 NOTE — ED Notes (Signed)
See triage note  Presents with left sided h/a   Pain also moves from left shoulder into arm  Good pulses  No n/v or fever

## 2019-08-24 ENCOUNTER — Encounter: Payer: Self-pay | Admitting: Nurse Practitioner

## 2019-08-24 ENCOUNTER — Telehealth: Payer: Self-pay | Admitting: *Deleted

## 2019-08-24 ENCOUNTER — Inpatient Hospital Stay: Admission: RE | Admit: 2019-08-24 | Payer: Medicaid Other | Source: Ambulatory Visit

## 2019-08-24 ENCOUNTER — Ambulatory Visit: Payer: Medicaid Other | Admitting: Physician Assistant

## 2019-08-24 ENCOUNTER — Other Ambulatory Visit: Payer: Self-pay

## 2019-08-24 ENCOUNTER — Ambulatory Visit: Payer: Medicaid Other | Admitting: Nurse Practitioner

## 2019-08-24 ENCOUNTER — Ambulatory Visit (INDEPENDENT_AMBULATORY_CARE_PROVIDER_SITE_OTHER): Payer: Medicaid Other | Admitting: Nurse Practitioner

## 2019-08-24 VITALS — BP 110/80 | HR 88 | Ht 65.5 in | Wt 239.0 lb

## 2019-08-24 DIAGNOSIS — I480 Paroxysmal atrial fibrillation: Secondary | ICD-10-CM | POA: Diagnosis not present

## 2019-08-24 DIAGNOSIS — Q2112 Patent foramen ovale: Secondary | ICD-10-CM

## 2019-08-24 DIAGNOSIS — I10 Essential (primary) hypertension: Secondary | ICD-10-CM

## 2019-08-24 DIAGNOSIS — R5383 Other fatigue: Secondary | ICD-10-CM | POA: Diagnosis not present

## 2019-08-24 DIAGNOSIS — Q211 Atrial septal defect: Secondary | ICD-10-CM | POA: Diagnosis not present

## 2019-08-24 DIAGNOSIS — E782 Mixed hyperlipidemia: Secondary | ICD-10-CM

## 2019-08-24 MED ORDER — ATORVASTATIN CALCIUM 40 MG PO TABS: 40.0000 mg | ORAL_TABLET | Freq: Every day | ORAL | 0 refills | Status: DC

## 2019-08-24 MED ORDER — APIXABAN 5 MG PO TABS: 5.0000 mg | ORAL_TABLET | Freq: Two times a day (BID) | ORAL | 0 refills | Status: DC

## 2019-08-24 MED ORDER — METOPROLOL TARTRATE 25 MG PO TABS: 25.0000 mg | ORAL_TABLET | Freq: Two times a day (BID) | ORAL | 1 refills | Status: DC

## 2019-08-24 NOTE — Telephone Encounter (Signed)
Patient called and is scheduled for surgery, patient has decided that she wants to hold off from having her surgery at this time and would like to cancel her surgery with Dr. Claudine Mouton on 08/30/2019.

## 2019-08-24 NOTE — Progress Notes (Signed)
Office Visit    Patient Name: Margaret [name] Date of Encounter: 08/24/2019  Primary Care Provider:  Trey Sailors, PA-C Primary Cardiologist:  [name], MD  Chief Complaint    49 y/o ? w/ a h/o PAF on eliquis, stroke, PFO, hypothyroidism, asthma, remote history of polysubstance abuse, tobacco abuse, and recent COVID-19 infection (February 2021), who presents for follow-up related to neck pain and L arm numbness.  Past Medical History    Past Medical History:  Diagnosis Date   Anxiety    Asthma    controlled   CVA (cerebral vascular accident) (HCC)    a. 09/2016   Depression    Dysrhythmia    Hypertension    Hypothyroidism    Morbid obesity (HCC)    PAF (paroxysmal atrial fibrillation) (HCC) 09/2016   a. 30-day event monitor 6/18: predominant rhythm of sinus with isolated PACs and PVCs.  Two narrow complex tachycardia episodes concerning for A. Fib +/-atrial flutter or SVT.  No prolonged pauses; b. CHADS2VASc = 3 (stroke x 2, female)--> Eliquis.   PFO (patent foramen ovale)    a. TTE 6/18: EF of 60 to 65%, normal wall motion, grade 1 diastolic dysfunction; b. TEE 6/18: evidence of a very small atrial level right to left shunt by bubble study without visualization of ASD or PFO   Substance abuse (HCC)    Past use of cocaine    Tobacco abuse    Past Surgical History:  Procedure Laterality Date   FRACTURE SURGERY     TEE WITHOUT CARDIOVERSION N/A 09/08/2016   Procedure: TRANSESOPHAGEAL ECHOCARDIOGRAM (TEE);  Surgeon: [name], MD;  Location: [facility] ORS;  Service: Cardiovascular;  Laterality: N/A;    Allergies  No Known Allergies  History of Present Illness    49 year old female with the above complex past medical history with hospitalization in June 2018 in the setting of slurred speech and right frontal lobe stroke.  Echocardiogram showed normal LV function with grade 1 diastolic dysfunction.  Carotid ultrasound showed less than 50%  bilateral internal carotid artery stenoses.  Transesophageal echocardiogram was undertaken and showed a very small atrial level right to left shunt consistent with PFO.  This was followed by 30-day event monitor which showed 2 episodes of narrow complex tachycardia concerning for atrial flutter/fibrillation.  In that setting, she was placed on Xarelto and beta-blocker therapy and was later switched from Xarelto to Eliquis.  Other history includes hypothyroidism, asthma, obesity, remote cocaine abuse, tobacco abuse, and marijuana usage.  She was last seen in clinic in December 2020, at which time she had recently returned to work as a Lawyer and was noting some dyspnea on exertion.  In February, she was tested + for COVID-19 on routine preoperative swab prior to planned knee arthroscopy.  Surgery was canceled and she was subsequently treated with bamlanivimab.  She says she is not really sure if she was ever symptomatic.  She does work in a Garment/textile technologist facility and was exposed to a coworker who had previously been Covid positive.  She felt as though she recovered pretty well but recently has noted some fatigue.  On May 18, she was experiencing pain in her neck and shoulders as well as intermittent sharp and fleeting pain with paresthesias from her left elbow down to her fingertips on the left hand.  She was concerned about TIA/stroke symptoms.  She called our office and subsequently went to the emergency department.  She initially left the ER but then  after further discussion with our office she returned.  Head CT was normal.  Shoulder pain was reproducible with palpation.  She was discharged home on Robaxin and Fioricet.  She has noted improvement in shoulder/neck pain with this therapy but notes that symptoms return in between doses.  She has an appointment with a chiropractor this afternoon.  She has not had chest pain, dyspnea, PND, orthopnea, dizziness, syncope, edema, or early satiety, but has continued to  feel somewhat fatigued.  She initially wished to have a lab work drawn today but subsequently decided to reach out to her primary care provider as there are other labs she would also like to have drawn.  Home Medications    Prior to Admission medications   Medication Sig Start Date End Date Taking? Authorizing Provider  atorvastatin (LIPITOR) 40 MG tablet TAKE 1 TABLET BY MOUTH EVERY DAY AT 6 PM 06/02/19   Dunn, Areta Haber, PA-C  butalbital-acetaminophen-caffeine (FIORICET) [phone] MG tablet Take 1-2 tablets by mouth every 6 (six) hours as needed for up to 4 days for headache. 08/22/19 08/26/19  Lannie Fields, PA-C  ciclopirox [facility]) 8 % solution Apply 1 application topically daily. Put on toes for 7 days then wipe off 05/01/19   [provider]  cyclobenzaprine (FLEXERIL) 5 MG tablet Take 1 tablet (5 mg total) by mouth at bedtime. 11/10/18   Pollak, Adriana M, PA-C  ELIQUIS 5 MG TABS tablet TAKE 1 TABLET BY MOUTH TWICE DAILY Patient taking differently: Take 5 mg by mouth 2 (two) times daily.  11/02/18   End, Harrell Gave, MD  HYDROcodone-acetaminophen (NORCO/VICODIN) 5-325 MG tablet Take 1 tablet by mouth every 6 (six) hours as needed for moderate pain. 07/05/19   Johnn Hai, PA-C  levothyroxine (SYNTHROID) 100 MCG tablet Take 1 tablet (100 mcg total) by mouth daily. Patient taking differently: Take 125 mcg by mouth daily.  01/04/19   Trinna Post, PA-C  methocarbamol (ROBAXIN) 500 MG tablet Take 1 tablet (500 mg total) by mouth every 8 (eight) hours as needed for up to 5 days. 08/22/19 08/27/19  Lannie Fields, PA-C  metoprolol tartrate (LOPRESSOR) 25 MG tablet Take 1 tablet (25 mg total) by mouth 2 (two) times daily. 05/30/19 05/29/20  Theora Gianotti, NP  Multiple Vitamins-Minerals (MULTIVITAMIN WITH MINERALS) tablet Take 1 tablet by mouth daily.    [provider]  prazosin (MINIPRESS) 2 MG capsule Take 2 mg by mouth daily as needed (Sleep). In the evening     [provider]  REXULTI 2 MG TABS tablet Take 2 mg by mouth daily. 05/22/19   [provider]  sertraline (ZOLOFT) 100 MG tablet Take 1 tablet (100 mg total) by mouth daily. Patient taking differently: Take 25 mg by mouth daily.  11/21/18 05/23/19  Trinna Post, PA-C  triamcinolone cream (KENALOG) 0.1 % Apply 1 application topically 2 (two) times daily. 06/30/19   Trinna Post, PA-C  valACYclovir (VALTREX) 1000 MG tablet Take 1 tablet (1,000 mg total) by mouth 2 (two) times daily. 11/10/18   Trinna Post, PA-C    Review of Systems    Recent neck and shoulder pain with sharp shooting and fleeting left forearm pain and paresthesias.  She has also been experiencing some fatigue.  She denies chest pain, palpitations, dyspnea, PND, orthopnea, dizziness, syncope, edema, or early satiety.  All other systems reviewed and are otherwise negative except as noted above.  Physical Exam    VS:  BP 110/80 (BP  Location: Left Arm, Patient Position: Sitting, Cuff Size: Large)    Pulse 88    Ht 5' 5.5" (1.664 m)    Wt 239 lb (108.4 kg)    LMP 05/07/2016    SpO2 98%    BMI 39.17 kg/m  , BMI Body mass index is 39.17 kg/m. GEN: Well nourished, well developed, in no acute distress. HEENT: normal. Neck: Supple, no JVD, carotid bruits, or masses. Cardiac: RRR, no murmurs, rubs, or gallops. No clubbing, cyanosis, edema.  Radials/PT 2+ and equal bilaterally.  Respiratory:  Respirations regular and unlabored, clear to auscultation bilaterally. GI: Soft, nontender, nondistended, BS + x 4. MS: no deformity or atrophy. Skin: warm and dry, no rash. Neuro:  Strength and sensation are intact. Psych: Normal affect.  Accessory Clinical Findings    ECG personally reviewed by me today -regular sinus rhythm, 88, nonspecific T changes - no acute changes.  Lab Results  Component Value Date   WBC 12.2 (H) 05/30/2019   HGB 12.1 05/30/2019   HCT 37.4 05/30/2019   MCV 85.6 05/30/2019   PLT 367  05/30/2019   Lab Results  Component Value Date   CREATININE 0.74 05/30/2019   BUN 10 05/30/2019   NA 139 05/30/2019   K 3.4 (L) 05/30/2019   CL 102 05/30/2019   CO2 30 05/30/2019   Lab Results  Component Value Date   ALT 18 06/17/2018   AST 19 06/17/2018   ALKPHOS 92 06/17/2018   BILITOT 0.8 06/17/2018   Lab Results  Component Value Date   CHOL 135 02/24/2018   HDL 37 (L) 02/24/2018   LDLCALC 66 02/24/2018   TRIG 161 (H) 02/24/2018   CHOLHDL 3.6 02/24/2018    Lab Results  Component Value Date   HGBA1C 5.8 (H) 09/08/2016    Assessment & Plan    1.  Fatigue: Patient notes increased fatigue over the past few days.  She has not been experiencing any chest pain or dyspnea.  She was recently seen in the emergency department on the 18th due to trapezius pain and left forearm pain and paresthesias.  Trapezius pain was reproducible.  Head CT was negative.  She has continued to have fatigue.  I offered to draw lab work today however, she prefers to have this performed through her primary care provider.    2.  Trapezius and left arm pain: As above, recently seen in the emergency department.  Trapezius pain was reproducible with palpation.  She notes that left arm are very brief and seem to improve with repositioning of her upper body and head.  Question cervical radiculopathy.  She is actually going to see a chiropractor this afternoon.  Imaging may be warranted.  3.  Paroxysmal atrial fibrillation: Maintaining sinus rhythm.  She denies any palpitations.  She remains on beta-blocker and Eliquis but notes that she recently ran out of Eliquis.  We will refill today.    4.  History of stroke: No residual deficits.  She remains on Eliquis and statin therapy.  5.  PFO: Small PFO noted on transesophageal echocardiogram the time of stroke 2018.  As she remains on lifelong anticoagulation setting of atrial fibrillation, no intervention is required at this time.  6.  Tobacco and marijuana  abuse: Cessation advised.  7.  Hypothyroidism: On levothyroxine.  She plans to have a battery of follow-up labs with primary care within the next week or so.  8.  Morbid obesity: She wishes to lose weight and has been walking regularly.  9.  Hyperlipidemia: LDL of 66 in November 2019.  She is due for follow-up labs and will follow up with primary care.  She ran out of her atorvastatin and I will refill.  10.  Disposition: Follow-up in 6 months or sooner if necessary.  Nicolasa Ducking, NP 08/24/2019, 3:52 PM

## 2019-08-24 NOTE — Telephone Encounter (Signed)
I did call the patient back and when asked to reschedule her surgery she is now refusing.  When asked why she said she was "scared" and does not want to reschedule. Central scheduling and pre-admissions is called and all appointments and surgery cancelled at patient's request.

## 2019-08-24 NOTE — Patient Instructions (Signed)
Medication Instructions:  Your physician recommends that you continue on your current medications as directed. Please refer to the Current Medication list given to you today.  *If you need a refill on your cardiac medications before your next appointment, please call your pharmacy*   Lab Work: None ordered  If you have labs (blood work) drawn today and your tests are completely normal, you will receive your results only by: Marland Kitchen MyChart Message (if you have MyChart) OR . A paper copy in the mail If you have any lab test that is abnormal or we need to change your treatment, we will call you to review the results.   Testing/Procedures: None ordered    Follow-Up: At [facility], you and your health needs are our priority.  As part of our continuing mission to provide you with exceptional heart care, we have created designated Provider Care Teams.  These Care Teams include your primary Cardiologist (physician) and Advanced Practice Providers (APPs -  Physician Assistants and Nurse Practitioners) who all work together to provide you with the care you need, when you need it.  We recommend signing up for the patient portal called "MyChart".  Sign up information is provided on this After Visit Summary.  MyChart is used to connect with patients for Virtual Visits (Telemedicine).  Patients are able to view lab/test results, encounter notes, upcoming appointments, etc.  Non-urgent messages can be sent to your provider as well.   To learn more about what you can do with MyChart, go to [email or web address].    Your next appointment:   6 month(s)  The format for your next appointment:   In Person  Provider:    You may see [name], MD or Nicolasa Ducking, NP  Eula Listen, PA-C  Marisue Ivan, PA-C    Other Instructions

## 2019-08-25 DIAGNOSIS — Z9151 Personal history of suicidal behavior: Secondary | ICD-10-CM

## 2019-08-25 DIAGNOSIS — R7611 Nonspecific reaction to tuberculin skin test without active tuberculosis: Secondary | ICD-10-CM

## 2019-08-28 ENCOUNTER — Ambulatory Visit: Payer: Medicaid Other

## 2019-08-28 ENCOUNTER — Other Ambulatory Visit: Payer: Medicaid Other

## 2019-08-28 ENCOUNTER — Telehealth: Payer: Self-pay | Admitting: Surgery

## 2019-08-28 NOTE — Telephone Encounter (Signed)
Outgoing call is made, left message for patient to call.  Please advise patient of Pre-Admission date/time, COVID Testing date and Surgery date.  Surgery Date: 09/08/19 Preadmission Testing Date: 09/05/19 (phone 8a-1p) Covid Testing Date: 09/06/19 - [facility] [facility]) between 8a-1p  Also patient needs to call [phone], between 1-3:00pm the day before surgery, to find out what time to arrive for surgery.

## 2019-08-29 NOTE — Telephone Encounter (Signed)
Outgoing call is made again.  Patient is now informed of her dates regarding her surgery and voices understanding.

## 2019-08-30 ENCOUNTER — Ambulatory Visit: Admit: 2019-08-30 | Payer: Medicaid Other | Admitting: Surgery

## 2019-08-30 SURGERY — REPAIR, HERNIA, EPIGASTRIC, ADULT
Anesthesia: General

## 2019-09-01 ENCOUNTER — Telehealth: Payer: Self-pay | Admitting: Physician Assistant

## 2019-09-01 NOTE — Telephone Encounter (Signed)
Dismissal letter for no show policy complete.

## 2019-09-05 ENCOUNTER — Encounter
Admission: RE | Admit: 2019-09-05 | Discharge: 2019-09-05 | Disposition: A | Discharge status: Home / Self Care | Payer: Medicaid Other | Source: Ambulatory Visit | Attending: Surgery | Admitting: Surgery

## 2019-09-05 ENCOUNTER — Telehealth: Payer: Self-pay | Admitting: *Deleted

## 2019-09-05 ENCOUNTER — Other Ambulatory Visit: Payer: Self-pay

## 2019-09-05 ENCOUNTER — Other Ambulatory Visit
Admission: RE | Admit: 2019-09-05 | Discharge: 2019-09-05 | Disposition: A | Discharge status: Home / Self Care | Payer: Medicaid Other | Source: Ambulatory Visit | Attending: Surgery | Admitting: Surgery

## 2019-09-05 ENCOUNTER — Other Ambulatory Visit: Payer: Self-pay | Admitting: Surgery

## 2019-09-05 DIAGNOSIS — Z01812 Encounter for preprocedural laboratory examination: Secondary | ICD-10-CM | POA: Insufficient documentation

## 2019-09-05 DIAGNOSIS — Z20822 Contact with and (suspected) exposure to covid-19: Secondary | ICD-10-CM | POA: Diagnosis not present

## 2019-09-05 DIAGNOSIS — K436 Other and unspecified ventral hernia with obstruction, without gangrene: Secondary | ICD-10-CM

## 2019-09-05 HISTORY — DX: Unspecified osteoarthritis, unspecified site: M19.90

## 2019-09-05 HISTORY — DX: Headache, unspecified: R51.9

## 2019-09-05 LAB — COMPREHENSIVE METABOLIC PANEL
ALT: 23 U/L (ref 0–44)
AST: 24 U/L (ref 15–41)
Albumin: 3.6 g/dL (ref 3.5–5.0)
Alkaline Phosphatase: 102 U/L (ref 38–126)
Anion gap: 11 (ref 5–15)
BUN: 13 mg/dL (ref 6–20)
CO2: 26 mmol/L (ref 22–32)
Calcium: 9.1 mg/dL (ref 8.9–10.3)
Chloride: 104 mmol/L (ref 98–111)
Creatinine, Ser: 0.69 mg/dL (ref 0.44–1.00)
GFR calc Af Amer: >60 mL/min (ref >60)
GFR calc non Af Amer: >60 mL/min (ref >60)
Glucose, Bld: 94 mg/dL (ref 70–99)
Potassium: 3.2 mmol/L — ABNORMAL LOW (ref 3.5–5.1)
Sodium: 141 mmol/L (ref 135–145)
Total Bilirubin: 0.4 mg/dL (ref 0.3–1.2)
Total Protein: 7.2 g/dL (ref 6.5–8.1)

## 2019-09-05 LAB — CBC WITH DIFFERENTIAL/PLATELET
Abs Immature Granulocytes: 0.04 10*3/uL (ref 0.00–0.07)
Basophils Absolute: 0.1 10*3/uL (ref 0.0–0.1)
Basophils Relative: 1 %
Eosinophils Absolute: 0.1 10*3/uL (ref 0.0–0.5)
Eosinophils Relative: 1 %
HCT: 36.3 % (ref 36.0–46.0)
Hemoglobin: 11.8 g/dL — ABNORMAL LOW (ref 12.0–15.0)
Immature Granulocytes: 0 %
Lymphocytes Relative: 40 %
Lymphs Abs: 5 10*3/uL — ABNORMAL HIGH (ref 0.7–4.0)
MCH: 27.6 pg (ref 26.0–34.0)
MCHC: 32.5 g/dL (ref 30.0–36.0)
MCV: 85 fL (ref 80.0–100.0)
Monocytes Absolute: 0.7 10*3/uL (ref 0.1–1.0)
Monocytes Relative: 6 %
Neutro Abs: 6.4 10*3/uL (ref 1.7–7.7)
Neutrophils Relative %: 52 %
Platelets: 353 10*3/uL (ref 150–400)
RBC: 4.27 MIL/uL (ref 3.87–5.11)
RDW: 14.6 % (ref 11.5–15.5)
Smear Review: NORMAL
WBC: 12.3 10*3/uL — ABNORMAL HIGH (ref 4.0–10.5)
nRBC: 0 % (ref 0.0–0.2)

## 2019-09-05 LAB — SARS CORONAVIRUS 2 (TAT 6-24 HRS): SARS Coronavirus 2: NEGATIVE

## 2019-09-05 MED ORDER — POTASSIUM CHLORIDE ER 8 MEQ PO TBCR: 8.0000 meq | EXTENDED_RELEASE_TABLET | Freq: Two times a day (BID) | ORAL | 0 refills | Status: DC

## 2019-09-05 NOTE — Patient Instructions (Signed)
Your procedure is scheduled on: Friday June 4th  Report to Day Surgery. To find out your arrival time please call [phone] between 1PM - 3PM on Thursday September 07, 2019.  Remember: Instructions that are not followed completely may result in serious medical risk,  up to and including death, or upon the discretion of your surgeon and anesthesiologist your  surgery may need to be rescheduled.     _X__ 1. Do not eat food after midnight the night before your procedure.                 No gum chewing or hard candies. You may drink clear liquids up to 2 hours                 before you are scheduled to arrive for your surgery- DO not drink clear                 liquids within 2 hours of the start of your surgery.                 Clear Liquids include:  water, apple juice without pulp, clear Gatorade, G2 or                  Gatorade Zero (avoid Red/Purple/Blue), Black Coffee or Tea (Do not add                 anything to coffee or tea).  __X__2.  On the morning of surgery brush your teeth with toothpaste and water, you                may rinse your mouth with mouthwash if you wish.  Do not swallow any toothpaste of mouthwash.     _X__ 3.  No Alcohol for 24 hours before or after surgery.   _X__ 4.  Do Not Smoke or use e-cigarettes For 24 Hours Prior to Your Surgery.                 Do not use any chewable tobacco products for at least 6 hours prior to                 Surgery.  _X__  5.  Do not use any recreational drugs (marijuana, cocaine, heroin, ecstacy, MDMA or other)                For at least one week prior to your surgery.  Combination of these drugs with anesthesia                May have life threatening results.  __X__ 6.  Notify your doctor if there is any change in your medical condition      (cold, fever, infections).     Do not wear jewelry, make-up, hairpins, clips or nail polish. Do not wear lotions, powders, or perfumes. You may wear deodorant. Do  not shave 48 hours prior to surgery. Men may shave face and neck. Do not bring valuables to the hospital.    [facility] is not responsible for any belongings or valuables.  Contacts, dentures or bridgework may not be worn into surgery. Leave your suitcase in the car. After surgery it may be brought to your room. For patients admitted to the hospital, discharge time is determined by your treatment team.   Patients discharged the day of surgery will not be allowed to drive home.   Make arrangements for someone to be with you for the first  24 hours of your Same Day Discharge.     ____ Take these medicines the morning of surgery with A SIP OF WATER:    1. metoprolol tartrate (LOPRESSOR) 25 MG  2. levothyroxine (SYNTHROID) 100 mcg   3. sertraline (ZOLOFT) 100 MG  4. REXULTI 2 MG  5.HYDROcodone-acetaminophen (NORCO/VICODIN) 5-325 MG (if needed for pain)    __X__ Use CHG Soap as directed  __X__ Stop apixaban (ELIQUIS) 5 MG 3 days before your surgery as instructed by your doctor.  __X__ Stop Anti-inflammatories such Ibuprofen, Aleve, naproxen, aspirin and or BC powders.    __X__ Stop supplements until after surgery.    __X__ Do not start any herbal supplements before your surgery.

## 2019-09-05 NOTE — Telephone Encounter (Signed)
Patient called and wanted to talk directly to you in regards to her surgery that she is having done with you on 64/21. Patient didn't want to speak with a nurse

## 2019-09-06 ENCOUNTER — Other Ambulatory Visit: Payer: Medicaid Other

## 2019-09-07 ENCOUNTER — Telehealth: Payer: Self-pay | Admitting: Surgery

## 2019-09-07 ENCOUNTER — Ambulatory Visit
Admission: RE | Admit: 2019-09-07 | Discharge: 2019-09-07 | Disposition: A | Discharge status: Home / Self Care | Payer: Medicaid Other | Source: Ambulatory Visit | Attending: Surgery | Admitting: Surgery

## 2019-09-07 ENCOUNTER — Other Ambulatory Visit: Payer: Self-pay

## 2019-09-07 DIAGNOSIS — K436 Other and unspecified ventral hernia with obstruction, without gangrene: Secondary | ICD-10-CM | POA: Insufficient documentation

## 2019-09-07 MED ORDER — IOHEXOL 300 MG/ML  SOLN
100.0000 mL | Freq: Once | INTRAMUSCULAR | Status: AC | PRN
  Administered 2019-09-07: 100 mL via INTRAVENOUS

## 2019-09-07 NOTE — Telephone Encounter (Signed)
Update regarding surgery: Patient returns call.  She is informed of her new surgery date for 09/13/19.  But when told of her new Covid testing date for 09/11/19 she started getting very rude and aggressive with her tone. In earlier conversation she was informed that if her surgery was rescheduled that she would have to do another Covid test and that this would be scheduled for 09/11/19 and at the time was in agreement with that and voiced understanding.  But now patient very angry, frustrated with the whole surgery process.  She doesn't understand why she has to do another Covid test.  She is informed that we are following CDC guidelines with Covid testing prior to patient having surgery.  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  Patient aggressive with conversation, would not let me speak and hung up.  At this point, not sure what patient will be doing.  Her surgery to date has already been scheduled and rescheduled three times.

## 2019-09-07 NOTE — Telephone Encounter (Signed)
Surgery update, rescheduled again now to 09/13/19.  Outgoing call is made, left message for patient to call.  Please advise patient of Pre-Admission date/time, COVID Testing date and Surgery date.  Surgery Date: 09/13/19 Preadmission Testing Date: 09/05/19 (phone already done) Covid Testing Date: 09/11/19 - patient [facility] ([address] [facility]) between 8a-1p.    Also patient needs to call [phone], between 1-3:00pm the day before surgery, to find out what time to arrive for surgery.

## 2019-09-08 ENCOUNTER — Telehealth: Payer: Self-pay | Admitting: Surgery

## 2019-09-08 NOTE — Telephone Encounter (Signed)
Patient called and is asking if her ct results are in. Please call patient and advise.

## 2019-09-08 NOTE — Telephone Encounter (Signed)
Per  Dr Claudine Mouton, " she has gall stones and we can talk about this on Tuesday if she wants an appointment."  Pt voiced understanding, virtual appointment made for 09/12/19 at 11:30am.

## 2019-09-08 NOTE — Telephone Encounter (Signed)
Message sent to Dr Claudine Mouton, awaiting response.

## 2019-09-11 ENCOUNTER — Other Ambulatory Visit: Payer: Self-pay

## 2019-09-11 ENCOUNTER — Telehealth: Payer: Self-pay | Admitting: Surgery

## 2019-09-11 ENCOUNTER — Other Ambulatory Visit
Admission: RE | Admit: 2019-09-11 | Discharge: 2019-09-11 | Disposition: A | Discharge status: Home / Self Care | Payer: Medicaid Other | Source: Ambulatory Visit | Attending: Surgery | Admitting: Surgery

## 2019-09-11 DIAGNOSIS — Z01812 Encounter for preprocedural laboratory examination: Secondary | ICD-10-CM | POA: Insufficient documentation

## 2019-09-11 DIAGNOSIS — Z20822 Contact with and (suspected) exposure to covid-19: Secondary | ICD-10-CM | POA: Diagnosis not present

## 2019-09-11 LAB — SARS CORONAVIRUS 2 (TAT 6-24 HRS): SARS Coronavirus 2: NEGATIVE

## 2019-09-11 NOTE — Telephone Encounter (Signed)
The patient will need to stop her Eliquis 2 days prior to surgery. She will hold it today and tomorrow. They will let her know when she may resume this.

## 2019-09-11 NOTE — Telephone Encounter (Signed)
Patient called back and was given the information about her Eliquis.

## 2019-09-11 NOTE — Telephone Encounter (Signed)
I have left detailed messages at both contact numbers for the patient about this.

## 2019-09-11 NOTE — Telephone Encounter (Signed)
Patient is calling and is asking about what she needs to do about her Eliquis. Please call patient and advise.

## 2019-09-12 ENCOUNTER — Telehealth (INDEPENDENT_AMBULATORY_CARE_PROVIDER_SITE_OTHER): Payer: Medicaid Other | Admitting: Surgery

## 2019-09-12 DIAGNOSIS — K8010 Calculus of gallbladder with chronic cholecystitis without obstruction: Secondary | ICD-10-CM | POA: Diagnosis not present

## 2019-09-12 NOTE — Progress Notes (Signed)
We finally connected over the phone, and I reviewed that I believe her gallstones were related more to her symptomatology than her hernia. She then explained that she had had what sounds like common duct stones and ERCPs to clear her common duct.  She never proceeded with cholecystectomy because she had a stroke. She is in agreement of proceeding with her cholecystectomy, understanding that I will be addressing her small fascial defect in the midst of the operation.  Our plan is to proceed with robotic cholecystectomy.  She is holding her Eliquis.  The risks, benefits, potential complications, alternative treatment options, evaluations and diagnoses, and likely outcomes were discussed in detail with the patient. The possibility of anesthetic complications, bleeding, infection, finding a normal appearing gallbladder, trauma to adjacent organs, or injury to surrounding structures, bile leak or obstruction,  the need for additional procedures, reaction to medication, pulmonary aspiration, the possible need to convert to an open procedure, and issues requiring transfusion or further operations were discussed with the patient. Questions sought, and answered to satisfaction.  No guarantees were ever spoken or implied.  The patient and/or family concurred with the proposed plan, and gave informed consent.

## 2019-09-13 ENCOUNTER — Encounter: Payer: Self-pay | Admitting: Surgery

## 2019-09-13 ENCOUNTER — Ambulatory Visit: Payer: Medicaid Other | Admitting: Certified Registered"

## 2019-09-13 ENCOUNTER — Other Ambulatory Visit: Payer: Self-pay

## 2019-09-13 ENCOUNTER — Ambulatory Visit
Admission: RE | Admit: 2019-09-13 | Discharge: 2019-09-13 | Disposition: A | Discharge status: Home / Self Care | Payer: Medicaid Other | Attending: Surgery | Admitting: Surgery

## 2019-09-13 ENCOUNTER — Encounter
Admission: RE | Disposition: A | Discharge status: Home / Self Care | Payer: Self-pay | Source: Home / Self Care | Attending: Surgery

## 2019-09-13 DIAGNOSIS — Q211 Atrial septal defect: Secondary | ICD-10-CM | POA: Diagnosis not present

## 2019-09-13 DIAGNOSIS — Z79899 Other long term (current) drug therapy: Secondary | ICD-10-CM | POA: Diagnosis not present

## 2019-09-13 DIAGNOSIS — E785 Hyperlipidemia, unspecified: Secondary | ICD-10-CM | POA: Diagnosis not present

## 2019-09-13 DIAGNOSIS — Z79890 Hormone replacement therapy (postmenopausal): Secondary | ICD-10-CM | POA: Diagnosis not present

## 2019-09-13 DIAGNOSIS — Z7901 Long term (current) use of anticoagulants: Secondary | ICD-10-CM | POA: Insufficient documentation

## 2019-09-13 DIAGNOSIS — F329 Major depressive disorder, single episode, unspecified: Secondary | ICD-10-CM | POA: Insufficient documentation

## 2019-09-13 DIAGNOSIS — J45909 Unspecified asthma, uncomplicated: Secondary | ICD-10-CM | POA: Diagnosis not present

## 2019-09-13 DIAGNOSIS — K439 Ventral hernia without obstruction or gangrene: Secondary | ICD-10-CM | POA: Insufficient documentation

## 2019-09-13 DIAGNOSIS — Z6839 Body mass index (BMI) 39.0-39.9, adult: Secondary | ICD-10-CM | POA: Insufficient documentation

## 2019-09-13 DIAGNOSIS — I480 Paroxysmal atrial fibrillation: Secondary | ICD-10-CM | POA: Diagnosis not present

## 2019-09-13 DIAGNOSIS — K8010 Calculus of gallbladder with chronic cholecystitis without obstruction: Secondary | ICD-10-CM | POA: Diagnosis present

## 2019-09-13 DIAGNOSIS — Z823 Family history of stroke: Secondary | ICD-10-CM | POA: Insufficient documentation

## 2019-09-13 DIAGNOSIS — E039 Hypothyroidism, unspecified: Secondary | ICD-10-CM | POA: Insufficient documentation

## 2019-09-13 DIAGNOSIS — Z8673 Personal history of transient ischemic attack (TIA), and cerebral infarction without residual deficits: Secondary | ICD-10-CM | POA: Insufficient documentation

## 2019-09-13 DIAGNOSIS — F17210 Nicotine dependence, cigarettes, uncomplicated: Secondary | ICD-10-CM | POA: Diagnosis not present

## 2019-09-13 DIAGNOSIS — F419 Anxiety disorder, unspecified: Secondary | ICD-10-CM | POA: Insufficient documentation

## 2019-09-13 DIAGNOSIS — K436 Other and unspecified ventral hernia with obstruction, without gangrene: Secondary | ICD-10-CM

## 2019-09-13 DIAGNOSIS — I10 Essential (primary) hypertension: Secondary | ICD-10-CM | POA: Insufficient documentation

## 2019-09-13 HISTORY — PX: XI ROBOTIC ASSISTED VENTRAL HERNIA: SHX6789

## 2019-09-13 LAB — URINE DRUG SCREEN, QUALITATIVE (ARMC ONLY)
Amphetamines, Ur Screen: NOT DETECTED
Barbiturates, Ur Screen: NOT DETECTED
Benzodiazepine, Ur Scrn: NOT DETECTED
Cannabinoid 50 Ng, Ur ~~LOC~~: POSITIVE — AB
Cocaine Metabolite,Ur ~~LOC~~: NOT DETECTED
MDMA (Ecstasy)Ur Screen: NOT DETECTED
Methadone Scn, Ur: NOT DETECTED
Opiate, Ur Screen: NOT DETECTED
Phencyclidine (PCP) Ur S: NOT DETECTED
Tricyclic, Ur Screen: NOT DETECTED

## 2019-09-13 SURGERY — REPAIR, HERNIA, VENTRAL, ROBOT-ASSISTED
Anesthesia: General

## 2019-09-13 MED ORDER — SUCCINYLCHOLINE CHLORIDE 20 MG/ML IJ SOLN
INTRAMUSCULAR | Status: DC | PRN
  Administered 2019-09-13: 120 mg via INTRAVENOUS

## 2019-09-13 MED ORDER — FENTANYL CITRATE (PF) 100 MCG/2ML IJ SOLN
INTRAMUSCULAR | Status: AC
  Filled 2019-09-13: qty 2

## 2019-09-13 MED ORDER — BUPIVACAINE LIPOSOME 1.3 % IJ SUSP
INTRAMUSCULAR | Status: AC
  Filled 2019-09-13: qty 20

## 2019-09-13 MED ORDER — OXYCODONE HCL 5 MG/5ML PO SOLN: 5.0000 mg | Freq: Once | ORAL | Status: AC | PRN

## 2019-09-13 MED ORDER — BUPIVACAINE LIPOSOME 1.3 % IJ SUSP
INTRAMUSCULAR | Status: DC | PRN
  Administered 2019-09-13: 20 mL

## 2019-09-13 MED ORDER — BUPIVACAINE LIPOSOME 1.3 % IJ SUSP: 20.0000 mL | Freq: Once | INTRAMUSCULAR | Status: DC

## 2019-09-13 MED ORDER — MIDAZOLAM HCL 2 MG/2ML IJ SOLN
INTRAMUSCULAR | Status: AC
  Filled 2019-09-13: qty 2

## 2019-09-13 MED ORDER — METOPROLOL TARTRATE 5 MG/5ML IV SOLN
INTRAVENOUS | Status: DC | PRN
  Administered 2019-09-13 (×2): 2.5 mg via INTRAVENOUS

## 2019-09-13 MED ORDER — APIXABAN 5 MG PO TABS: 5.0000 mg | ORAL_TABLET | Freq: Two times a day (BID) | ORAL | 0 refills | Status: DC

## 2019-09-13 MED ORDER — CHLORHEXIDINE GLUCONATE CLOTH 2 % EX PADS: 6.0000 | MEDICATED_PAD | Freq: Once | CUTANEOUS | Status: DC

## 2019-09-13 MED ORDER — EPINEPHRINE PF 1 MG/ML IJ SOLN
INTRAMUSCULAR | Status: AC
  Filled 2019-09-13: qty 1

## 2019-09-13 MED ORDER — GABAPENTIN 300 MG PO CAPS: 300.0000 mg | ORAL_CAPSULE | ORAL | Status: AC

## 2019-09-13 MED ORDER — EPHEDRINE 5 MG/ML INJ
INTRAVENOUS | Status: AC
  Filled 2019-09-13: qty 10

## 2019-09-13 MED ORDER — CEFAZOLIN SODIUM-DEXTROSE 2-4 GM/100ML-% IV SOLN
2.0000 g | INTRAVENOUS | Status: AC
  Administered 2019-09-13: 2 g via INTRAVENOUS

## 2019-09-13 MED ORDER — GABAPENTIN 300 MG PO CAPS
ORAL_CAPSULE | ORAL | Status: AC
  Administered 2019-09-13: 300 mg via ORAL
  Filled 2019-09-13: qty 1

## 2019-09-13 MED ORDER — FENTANYL CITRATE (PF) 100 MCG/2ML IJ SOLN
INTRAMUSCULAR | Status: DC | PRN
  Administered 2019-09-13 (×4): 50 ug via INTRAVENOUS

## 2019-09-13 MED ORDER — INDOCYANINE GREEN 25 MG IV SOLR
2.5000 mg | Freq: Once | INTRAVENOUS | Status: AC
  Administered 2019-09-13: 2.5 mg via INTRAVENOUS

## 2019-09-13 MED ORDER — ACETAMINOPHEN 500 MG PO TABS
ORAL_TABLET | ORAL | Status: AC
  Administered 2019-09-13: 1000 mg via ORAL
  Filled 2019-09-13: qty 2

## 2019-09-13 MED ORDER — ORAL CARE MOUTH RINSE: 15.0000 mL | Freq: Once | OROMUCOSAL | Status: AC

## 2019-09-13 MED ORDER — BUPIVACAINE-EPINEPHRINE (PF) 0.25% -1:200000 IJ SOLN
INTRAMUSCULAR | Status: DC | PRN
  Administered 2019-09-13: 30 mL via PERINEURAL

## 2019-09-13 MED ORDER — ROCURONIUM BROMIDE 100 MG/10ML IV SOLN
INTRAVENOUS | Status: DC | PRN
  Administered 2019-09-13: 10 mg via INTRAVENOUS
  Administered 2019-09-13: 40 mg via INTRAVENOUS

## 2019-09-13 MED ORDER — CHLORHEXIDINE GLUCONATE 0.12 % MT SOLN
OROMUCOSAL | Status: AC
  Administered 2019-09-13: 15 mL via OROMUCOSAL
  Filled 2019-09-13: qty 15

## 2019-09-13 MED ORDER — BUPIVACAINE HCL (PF) 0.25 % IJ SOLN
INTRAMUSCULAR | Status: AC
  Filled 2019-09-13: qty 30

## 2019-09-13 MED ORDER — SUCCINYLCHOLINE CHLORIDE 200 MG/10ML IV SOSY
PREFILLED_SYRINGE | INTRAVENOUS | Status: AC
  Filled 2019-09-13: qty 10

## 2019-09-13 MED ORDER — MIDAZOLAM HCL 2 MG/2ML IJ SOLN
INTRAMUSCULAR | Status: DC | PRN
  Administered 2019-09-13: 2 mg via INTRAVENOUS

## 2019-09-13 MED ORDER — LIDOCAINE HCL (CARDIAC) PF 100 MG/5ML IV SOSY
PREFILLED_SYRINGE | INTRAVENOUS | Status: DC | PRN
  Administered 2019-09-13: 50 mg via INTRAVENOUS

## 2019-09-13 MED ORDER — ROCURONIUM BROMIDE 10 MG/ML (PF) SYRINGE
PREFILLED_SYRINGE | INTRAVENOUS | Status: AC
  Filled 2019-09-13: qty 10

## 2019-09-13 MED ORDER — FAMOTIDINE 20 MG PO TABS
ORAL_TABLET | ORAL | Status: AC
  Administered 2019-09-13: 20 mg via ORAL
  Filled 2019-09-13: qty 1

## 2019-09-13 MED ORDER — HYDROCODONE-ACETAMINOPHEN 5-325 MG PO TABS
1.0000 | ORAL_TABLET | Freq: Four times a day (QID) | ORAL | 0 refills | Status: DC | PRN
Start: 2019-09-13 — End: 2020-01-22

## 2019-09-13 MED ORDER — SUGAMMADEX SODIUM 200 MG/2ML IV SOLN
INTRAVENOUS | Status: DC | PRN
  Administered 2019-09-13: 200 mg via INTRAVENOUS

## 2019-09-13 MED ORDER — CHLORHEXIDINE GLUCONATE 0.12 % MT SOLN: 15.0000 mL | Freq: Once | OROMUCOSAL | Status: AC

## 2019-09-13 MED ORDER — CHLORHEXIDINE GLUCONATE CLOTH 2 % EX PADS
6.0000 | MEDICATED_PAD | Freq: Once | CUTANEOUS | Status: AC
  Administered 2019-09-13: 6 via TOPICAL

## 2019-09-13 MED ORDER — ACETAMINOPHEN 500 MG PO TABS: 1000.0000 mg | ORAL_TABLET | ORAL | Status: AC

## 2019-09-13 MED ORDER — FAMOTIDINE 20 MG PO TABS: 20.0000 mg | ORAL_TABLET | Freq: Once | ORAL | Status: AC

## 2019-09-13 MED ORDER — LACTATED RINGERS IV SOLN: INTRAVENOUS | Status: DC

## 2019-09-13 MED ORDER — CEFAZOLIN SODIUM-DEXTROSE 2-4 GM/100ML-% IV SOLN
INTRAVENOUS | Status: AC
  Filled 2019-09-13: qty 100

## 2019-09-13 MED ORDER — OXYCODONE HCL 5 MG PO TABS
ORAL_TABLET | ORAL | Status: AC
  Administered 2019-09-13: 5 mg via ORAL
  Filled 2019-09-13: qty 1

## 2019-09-13 MED ORDER — EPHEDRINE SULFATE 50 MG/ML IJ SOLN
INTRAMUSCULAR | Status: DC | PRN
  Administered 2019-09-13: 10 mg via INTRAVENOUS

## 2019-09-13 MED ORDER — LIDOCAINE HCL (PF) 2 % IJ SOLN
INTRAMUSCULAR | Status: AC
  Filled 2019-09-13: qty 5

## 2019-09-13 MED ORDER — PROPOFOL 10 MG/ML IV BOLUS
INTRAVENOUS | Status: DC | PRN
  Administered 2019-09-13: 200 mg via INTRAVENOUS

## 2019-09-13 MED ORDER — ONDANSETRON HCL 4 MG/2ML IJ SOLN
INTRAMUSCULAR | Status: DC | PRN
  Administered 2019-09-13: 4 mg via INTRAVENOUS

## 2019-09-13 MED ORDER — DEXAMETHASONE SODIUM PHOSPHATE 10 MG/ML IJ SOLN
INTRAMUSCULAR | Status: AC
  Filled 2019-09-13: qty 1

## 2019-09-13 MED ORDER — FENTANYL CITRATE (PF) 100 MCG/2ML IJ SOLN: 25.0000 ug | INTRAMUSCULAR | Status: DC | PRN

## 2019-09-13 MED ORDER — METOPROLOL TARTRATE 5 MG/5ML IV SOLN
INTRAVENOUS | Status: AC
  Filled 2019-09-13: qty 5

## 2019-09-13 MED ORDER — ONDANSETRON HCL 4 MG/2ML IJ SOLN
INTRAMUSCULAR | Status: AC
  Filled 2019-09-13: qty 2

## 2019-09-13 MED ORDER — DEXAMETHASONE SODIUM PHOSPHATE 10 MG/ML IJ SOLN
INTRAMUSCULAR | Status: DC | PRN
  Administered 2019-09-13: 5 mg via INTRAVENOUS

## 2019-09-13 MED ORDER — OXYCODONE HCL 5 MG PO TABS: 5.0000 mg | ORAL_TABLET | Freq: Once | ORAL | Status: AC | PRN

## 2019-09-13 MED ORDER — IBUPROFEN 800 MG PO TABS: 800.0000 mg | ORAL_TABLET | Freq: Three times a day (TID) | ORAL | 0 refills | Status: DC | PRN

## 2019-09-13 SURGICAL SUPPLY — 55 items
ADH SKN CLS APL DERMABOND .7 (GAUZE/BANDAGES/DRESSINGS) ×1
APL PRP STRL LF DISP 70% ISPRP (MISCELLANEOUS) ×1
BAG SPEC RTRVL LRG 6X4 10 (ENDOMECHANICALS)
CANISTER SUCT 1200ML W/VALVE (MISCELLANEOUS) ×2 IMPLANT
CHLORAPREP W/TINT 26 (MISCELLANEOUS) ×2 IMPLANT
CLIP VESOLOCK MED LG 6/CT (CLIP) ×2 IMPLANT
COVER TIP SHEARS 8 DVNC (MISCELLANEOUS) ×1 IMPLANT
COVER TIP SHEARS 8MM DA VINCI (MISCELLANEOUS) ×2
COVER WAND RF STERILE (DRAPES) ×1 IMPLANT
DECANTER SPIKE VIAL GLASS SM (MISCELLANEOUS) ×2 IMPLANT
DEFOGGER SCOPE WARMER CLEARIFY (MISCELLANEOUS) ×2 IMPLANT
DERMABOND ADVANCED (GAUZE/BANDAGES/DRESSINGS) ×1
DERMABOND ADVANCED .7 DNX12 (GAUZE/BANDAGES/DRESSINGS) ×1 IMPLANT
DEVICE SECURE STRAP 25 ABSORB (INSTRUMENTS) ×1 IMPLANT
DRAPE ARM DVNC X/XI (DISPOSABLE) ×4 IMPLANT
DRAPE COLUMN DVNC XI (DISPOSABLE) ×1 IMPLANT
DRAPE DA VINCI XI ARM (DISPOSABLE) ×8
DRAPE DA VINCI XI COLUMN (DISPOSABLE) ×2
ELECT REM PT RETURN 9FT ADLT (ELECTROSURGICAL) ×2
ELECTRODE REM PT RTRN 9FT ADLT (ELECTROSURGICAL) ×1 IMPLANT
GLOVE ORTHO TXT STRL SZ7.5 (GLOVE) ×7 IMPLANT
GOWN STRL REUS W/ TWL LRG LVL3 (GOWN DISPOSABLE) ×4 IMPLANT
GOWN STRL REUS W/TWL LRG LVL3 (GOWN DISPOSABLE) ×10
GRASPER SUT TROCAR 14GX15 (MISCELLANEOUS) ×1 IMPLANT
IRRIGATION STRYKERFLOW (MISCELLANEOUS) IMPLANT
IRRIGATOR STRYKERFLOW (MISCELLANEOUS)
IV NS 1000ML (IV SOLUTION)
IV NS 1000ML BAXH (IV SOLUTION) IMPLANT
IV NS IRRIG 3000ML ARTHROMATIC (IV SOLUTION) IMPLANT
KIT PINK PAD W/HEAD ARE REST (MISCELLANEOUS) ×2
KIT PINK PAD W/HEAD ARM REST (MISCELLANEOUS) ×1 IMPLANT
KIT TURNOVER KIT A (KITS) ×2 IMPLANT
LABEL OR SOLS (LABEL) ×2 IMPLANT
NDL INSUFFLATION 14GA 120MM (NEEDLE) ×1 IMPLANT
NEEDLE HYPO 22GX1.5 SAFETY (NEEDLE) ×2 IMPLANT
NEEDLE INSUFFLATION 14GA 120MM (NEEDLE) ×2 IMPLANT
NS IRRIG 500ML POUR BTL (IV SOLUTION) ×2 IMPLANT
PACK LAP CHOLECYSTECTOMY (MISCELLANEOUS) ×2 IMPLANT
PENCIL ELECTRO HAND CTR (MISCELLANEOUS) ×1 IMPLANT
POUCH ENDO CATCH 10MM SPEC (MISCELLANEOUS) ×1 IMPLANT
POUCH SPECIMEN RETRIEVAL 10MM (ENDOMECHANICALS) ×1 IMPLANT
SCISSORS METZENBAUM CVD 33 (INSTRUMENTS) ×2 IMPLANT
SEAL CANN UNIV 5-8 DVNC XI (MISCELLANEOUS) ×4 IMPLANT
SEAL XI 5MM-8MM UNIVERSAL (MISCELLANEOUS) ×8
SET TUBE SMOKE EVAC HIGH FLOW (TUBING) ×2 IMPLANT
SOLUTION ELECTROLUBE (MISCELLANEOUS) ×2 IMPLANT
SUT MNCRL 4-0 (SUTURE) ×4
SUT MNCRL 4-0 27XMFL (SUTURE) ×2
SUT STRATAFIX 0 PDS+ CT-2 23 (SUTURE)
SUT VICRYL 0 AB UR-6 (SUTURE) ×2 IMPLANT
SUT VLOC 90 2/L VL 12 GS22 (SUTURE) ×1 IMPLANT
SUTURE MNCRL 4-0 27XMF (SUTURE) ×1 IMPLANT
SUTURE STRATFX 0 PDS+ CT-2 23 (SUTURE) ×1 IMPLANT
TROCAR XCEL 12X100 BLDLESS (ENDOMECHANICALS) ×2 IMPLANT
TROCAR Z-THREAD FIOS 11X100 BL (TROCAR) ×2 IMPLANT

## 2019-09-13 NOTE — Op Note (Signed)
Robotic cholecystectomy  Pre-operative Diagnosis: Chronic calculus cholecystitis  Post-operative Diagnosis:  Same.  Procedure: Robotic assisted laparoscopic cholecystectomy.  Epigastric hernia repair.   Surgeon: Campbell Lerner, M.D., FACS  Anesthesia: General. with endotracheal tube  Findings: 8 mm epigastric fascial defect with incarcerated falciform ligament with lipomatous tissue.  Estimated Blood Loss: 5 mL         Drains: None         Specimens: Gallbladder           Complications: none  Procedure Details  The patient was seen again in the Holding Room.  2.5 mg dose of ICG was administered intravenously.  The benefits, complications, treatment options, risks and expected outcomes were discussed with the patient. The likelihood of improving the patient's symptoms with return to their baseline status is good.  The patient and/or family concurred with the proposed plan, giving informed consent, again alternatives reviewed.  The patient was taken to Operating Room, identified, and the procedure verified as robotic assisted laparoscopic cholecystectomy.  Prior to the induction of general anesthesia, antibiotic prophylaxis was administered. VTE prophylaxis was in place. General endotracheal anesthesia was then administered and tolerated well. The patient was positioned in the supine position.  After the induction, the abdomen was prepped with Chloraprep and draped in the sterile fashion.  A Time Out was held and the above information confirmed.  Supra-umbilical local infiltration with quarter percent Marcaine with epinephrine is utilized, overlying the hernia mass, the lipomatous tissue was excised utilizing electrosurgery at the neck.  The fascial defect of approximately 8 mm was then identified and utilized as port site for an 8.5 mm robotic trocar, I advanced an optical port under direct visualization into the peritoneal cavity.  Once the peritoneum was penetrated, insufflation was  transferred.  The trocar was then advanced into the abdominal cavity under direct visualization. Pneumoperitoneum was then continued with CO2 at 14 mmHg or less and tolerated well without any adverse changes in the patient's vital signs.  Two 8.5-mm ports were placed in the right lower quadrant and laterally, and one to the left lower quadrant, all under direct vision. All skin incisions  were infiltrated with a local anesthetic agent before making the incision and placing the trocars.  The patient was positioned  in reverse Trendelenburg, tilted the patient's left side down.  Da Vinci XI robot was then positioned on to the patient's left side, and docked.  The gallbladder was identified, the fundus grasped via the arm 4 Prograsp and retracted cephalad. Adhesions were lysed with scissors and cautery. The infundibulum was identified grasped and retracted laterally, exposing the peritoneum overlying the triangle of Calot. This was then opened and dissected using cautery & scissors. An extended critical view of the cystic duct and cystic artery was obtained, aided by the ICG via FireFly which enabled ready visualization of the ductal anatomy.    The cystic duct was clearly identified and dissected to isolation.   Artery well isolated and clipped, and the cystic duct/artery were triple clipped and divided with scissors, leaving two on the remaining stumps.   The gallbladder was taken from the gallbladder fossa in a retrograde fashion with the electrocautery. The gallbladder was removed and placed in an Endocatch bag. Hemostasis was confirmed.  The robot was undocked and moved away from the operative field.  The gallbladder and Endocatch sac were then removed through the supra umbilical port site.   Inspection of the right upper quadrant was performed. No bleeding, bile duct injury  or leak, or bowel injury was noted. The supra-umbilical/epigastric port site fascia was closed with interrumpted 0 Vicryl sutures  using PMI/cone under direct visualization, x3 to ensure adequate closure of the prior fascial defect. Pneumoperitoneum was released and ports removed.  4-0 subcuticular Monocryl was used to close the skin. Dermabond was  applied.  The patient was then extubated and brought to the recovery room in stable condition. Sponge, lap, and needle counts were correct at closure and at the conclusion of the case.               Ronny Bacon, M.D., [facility] 09/13/2019 12:06 PM

## 2019-09-13 NOTE — Discharge Instructions (Signed)

## 2019-09-13 NOTE — Transfer of Care (Signed)
Immediate Anesthesia Transfer of Care Note  Patient: Margaret [name]  Procedure(s) Performed: XI ROBOTIC ASSISTED EPIGATRIC HERNIA (N/A ) XI ROBOTIC ASSISTED LAPAROSCOPIC CHOLECYSTECTOMY (N/A )  Patient Location: PACU  Anesthesia Type:General  Level of Consciousness: drowsy  Airway & Oxygen Therapy: Patient Spontanous Breathing and Patient connected to face mask oxygen  Post-op Assessment: Report given to RN  Post vital signs: stable  Last Vitals:  Vitals Value Taken Time  BP 136/92 09/13/19 1215  Temp 35.7 C 09/13/19 1215  Pulse 74 09/13/19 1217  Resp 17 09/13/19 1217  SpO2 98 % 09/13/19 1217  Vitals shown include unvalidated device data.  Last Pain:  Vitals:   09/13/19 0838  TempSrc: Tympanic  PainSc: 0-No pain         Complications: No apparent anesthesia complications

## 2019-09-13 NOTE — Anesthesia Postprocedure Evaluation (Signed)
Anesthesia Post Note  Patient: Margaret [name]  Procedure(s) Performed: XI ROBOTIC ASSISTED EPIGATRIC HERNIA (N/A ) XI ROBOTIC ASSISTED LAPAROSCOPIC CHOLECYSTECTOMY (N/A )  Patient location during evaluation: PACU Anesthesia Type: General Level of consciousness: awake and alert Pain management: pain level controlled Vital Signs Assessment: post-procedure vital signs reviewed and stable Respiratory status: spontaneous breathing, nonlabored ventilation, respiratory function stable and patient connected to nasal cannula oxygen Cardiovascular status: blood pressure returned to baseline and stable Postop Assessment: no apparent nausea or vomiting Anesthetic complications: no     Last Vitals:  Vitals:   09/13/19 1345 09/13/19 1351  BP: 123/89 132/90  Pulse: 79 70  Resp: 20 18  Temp: (!) 36.1 C (!) 36.1 C  SpO2: 95% 97%    Last Pain:  Vitals:   09/13/19 1351  TempSrc: Temporal  PainSc: 3                  Cleda Mccreedy [name]

## 2019-09-13 NOTE — Interval H&P Note (Signed)
History and Physical Interval Note:  09/13/2019 9:02 AM  Margaret [name]  has presented today for surgery, with the diagnosis of Epigastric hernia.  The various methods of treatment have been discussed with the patient and family. After consideration of risks, benefits and other options for treatment, the patient has consented to  Procedure(s): XI ROBOTIC ASSISTED EPIGATRIC HERNIA (N/A) INSERTION OF MESH possible (N/A) XI ROBOTIC ASSISTED LAPAROSCOPIC CHOLECYSTECTOMY (N/A) as a surgical intervention.  The patient's history has been reviewed, patient examined, no change in status, stable for surgery.  I have reviewed the patient's chart and labs.  Questions were answered to the patient's satisfaction.     Campbell Lerner, M.D., [facility]  09/13/2019 ; 9:03 AM

## 2019-09-13 NOTE — Anesthesia Preprocedure Evaluation (Signed)
Anesthesia Evaluation  Patient identified by MRN, date of birth, ID band Patient awake    Reviewed: Allergy & Precautions, H&P , NPO status , Patient's Chart, lab work & pertinent test results  History of Anesthesia Complications Negative for: history of anesthetic complications  Airway Mallampati: III  TM Distance: >3 FB Neck ROM: full    Dental  (+) Chipped, Poor Dentition   Pulmonary neg shortness of breath, asthma , Current Smoker and Patient abstained from smoking.,    Pulmonary exam normal        Cardiovascular Exercise Tolerance: Good hypertension, (-) angina(-) DOE Normal cardiovascular exam+ dysrhythmias Atrial Fibrillation      Neuro/Psych  Headaches, CVA negative psych ROS   GI/Hepatic negative GI ROS, Neg liver ROS, neg GERD  ,  Endo/Other  Hypothyroidism   Renal/GU      Musculoskeletal  (+) Arthritis ,   Abdominal   Peds  Hematology negative hematology ROS (+)   Anesthesia Other Findings Past Medical History: No date: Anxiety No date: Arthritis No date: Asthma     Comment:  controlled No date: CVA (cerebral vascular accident) (HCC)     Comment:  a. 09/2016 No date: Depression No date: Dysrhythmia No date: Headache No date: Hypertension No date: Hypothyroidism No date: Morbid obesity (HCC) 09/2016: PAF (paroxysmal atrial fibrillation) (HCC)     Comment:  a. 30-day event monitor 6/18: predominant rhythm of               sinus with isolated PACs and PVCs.  Two narrow complex               tachycardia episodes concerning for A. Fib +/-atrial               flutter or SVT.  No prolonged pauses; b. CHADS2VASc = 3               (stroke x 2, female)--> Eliquis. No date: PFO (patent foramen ovale)     Comment:  a. TTE 6/18: EF of 60 to 65%, normal wall motion, grade               1 diastolic dysfunction; b. TEE 6/18: evidence of a very               small atrial level right to left shunt by bubble  study               without visualization of ASD or PFO No date: Substance abuse (HCC)     Comment:  Past use of cocaine  No date: Tobacco abuse  Past Surgical History: No date: FRACTURE SURGERY 09/08/2016: TEE WITHOUT CARDIOVERSION; N/A     Comment:  Procedure: TRANSESOPHAGEAL ECHOCARDIOGRAM (TEE);                Surgeon: [name], MD;  Location: [facility] ORS;                Service: Cardiovascular;  Laterality: N/A;     Reproductive/Obstetrics negative OB ROS                             Anesthesia Physical Anesthesia Plan  ASA: III  Anesthesia Plan: General ETT   Post-op Pain Management:    Induction: Intravenous  PONV Risk Score and Plan: Ondansetron, Dexamethasone, Midazolam and Treatment may vary due to age or medical condition  Airway Management Planned: Oral ETT  Additional Equipment:   Intra-op Plan:  Post-operative Plan: Extubation in OR  Informed Consent: I have reviewed the patients History and Physical, chart, labs and discussed the procedure including the risks, benefits and alternatives for the proposed anesthesia with the patient or authorized representative who has indicated his/her understanding and acceptance.     Dental Advisory Given  Plan Discussed with: Anesthesiologist, CRNA and Surgeon  Anesthesia Plan Comments: (Patient consented for risks of anesthesia including but not limited to:  - adverse reactions to medications - damage to eyes, teeth, lips or other oral mucosa - nerve damage due to positioning  - sore throat or hoarseness - Damage to heart, brain, nerves, lungs, other parts of body or loss of life  Patient voiced understanding.)        Anesthesia Quick Evaluation

## 2019-09-13 NOTE — Anesthesia Procedure Notes (Signed)
Procedure Name: Intubation Date/Time: 09/13/2019 10:38 AM Performed by: Jaye Beagle, CRNA Pre-anesthesia Checklist: Patient identified, Emergency Drugs available, Suction available, Patient being monitored and Timeout performed Patient Re-evaluated:Patient Re-evaluated prior to induction Oxygen Delivery Method: Circle system utilized Preoxygenation: Pre-oxygenation with 100% oxygen Induction Type: IV induction Ventilation: Mask ventilation without difficulty Laryngoscope Size: McGraph and 3 Grade View: Grade II Tube size: 7.0 mm Number of attempts: 1 Airway Equipment and Method: Stylet Secured at: 21 cm Tube secured with: Tape Dental Injury: Teeth and Oropharynx as per pre-operative assessment

## 2019-09-14 ENCOUNTER — Telehealth: Payer: Self-pay | Admitting: Surgery

## 2019-09-14 ENCOUNTER — Encounter: Payer: Self-pay | Admitting: Surgery

## 2019-09-14 LAB — POCT I-STAT, CHEM 8
BUN: 7 mg/dL (ref 6–20)
Calcium, Ion: 1.22 mmol/L (ref 1.15–1.40)
Chloride: 103 mmol/L (ref 98–111)
Creatinine, Ser: 0.7 mg/dL (ref 0.44–1.00)
Glucose, Bld: 114 mg/dL — ABNORMAL HIGH (ref 70–99)
HCT: 37 % (ref 36.0–46.0)
Hemoglobin: 12.6 g/dL (ref 12.0–15.0)
Potassium: 3.4 mmol/L — ABNORMAL LOW (ref 3.5–5.1)
Sodium: 143 mmol/L (ref 135–145)
TCO2: 27 mmol/L (ref 22–32)

## 2019-09-14 NOTE — Telephone Encounter (Signed)
Called patient with no answer, left vm to call back to the office.

## 2019-09-14 NOTE — Telephone Encounter (Signed)
Patient is calling and is asking if one of the nurses would give her a call regarding some of her medications. Please call patient and advise.

## 2019-09-14 NOTE — Telephone Encounter (Signed)
Pt called back states yesterday she had surgery and medications were sent to her pharmacy. Pt states her friend went to pick up her medications. Per pt she states "my friend told me that the pharmacist told her she could not take Hydrocodone and Eliquis together". Pt wants to make sure if that is true. If it is to change pain medication.   Spoke to Dr Claudine Mouton, per Dr Claudine Mouton that is not true and patient can take both medications.   Advised pt of this, she voiced understanding and has no further concerns.

## 2019-09-15 LAB — SURGICAL PATHOLOGY

## 2019-09-19 ENCOUNTER — Telehealth: Payer: Self-pay

## 2019-09-19 NOTE — Telephone Encounter (Signed)
Copied from CRM [id number]. Topic: General - Other >> Sep 19, 2019 12:15 PM Herby Abraham C wrote: Reason for CRM: pt called in upset because she received a dismissal letter. Pt says that she's not understanding why the letter was sent out. Pt would like to discuss further.    CB: [phone]

## 2019-09-25 ENCOUNTER — Other Ambulatory Visit: Payer: Self-pay

## 2019-09-25 MED ORDER — ATORVASTATIN CALCIUM 40 MG PO TABS: 40.0000 mg | ORAL_TABLET | Freq: Every day | ORAL | 0 refills | Status: DC

## 2019-09-28 ENCOUNTER — Other Ambulatory Visit: Payer: Self-pay

## 2019-09-28 ENCOUNTER — Ambulatory Visit (INDEPENDENT_AMBULATORY_CARE_PROVIDER_SITE_OTHER): Payer: Self-pay | Admitting: Surgery

## 2019-09-28 ENCOUNTER — Encounter: Payer: Self-pay | Admitting: Surgery

## 2019-09-28 VITALS — BP 106/74 | HR 77 | Temp 97.0°F | Resp 12 | Ht 65.5 in | Wt 241.6 lb

## 2019-09-28 DIAGNOSIS — Z9049 Acquired absence of other specified parts of digestive tract: Secondary | ICD-10-CM | POA: Insufficient documentation

## 2019-09-28 NOTE — Patient Instructions (Signed)
Please call our office if you have questions or concerns. Increase water daily. Work on keeping your urine clear.   Fiber Content in Foods  See the following list for the dietary fiber content of some common foods. High-fiber foods High-fiber foods contain 4 grams or more (4g or more) of fiber per serving. They include:  Artichoke (fresh) -- 1 medium has 10.3g of fiber.  Baked beans, plain or vegetarian (canned) --  cup has 5.2g of fiber.  Blackberries or raspberries (fresh) --  cup has 4g of fiber.  Bran cereal --  cup has 8.6g of fiber.  Bulgur (cooked) --  cup has 4g of fiber.  Kidney beans (canned) --  cup has 6.8g of fiber.  Lentils (cooked) --  cup has 7.8g of fiber.  Pear (fresh) -- 1 medium has 5.1g of fiber.  Peas (frozen) --  cup has 4.4g of fiber.  Pinto beans (canned) --  cup has 5.5g of fiber.  Pinto beans (dried and cooked) --  cup has 7.7g of fiber.  Potato with skin (baked) -- 1 medium has 4.4g of fiber.  Quinoa (cooked) --  cup has 5g of fiber.  Soybeans (canned, frozen, or fresh) --  cup has 5.1g of fiber. Moderate-fiber foods Moderate-fiber foods contain 1-4 grams (1-4g) of fiber per serving. They include:  Almonds -- 1 oz. has 3.5g of fiber.  Apple with skin -- 1 medium has 3.3g of fiber.  Applesauce, sweetened --  cup has 1.5g of fiber.  Bagel, plain -- one 4-inch (10-cm) bagel has 2g of fiber.  Banana -- 1 medium has 3.1g of fiber.  Broccoli (cooked) --  cup has 2.5g of fiber.  Carrots (cooked) --  cup has 2.3g of fiber.  Corn (canned or frozen) --  cup has 2.1g of fiber.  Corn tortilla -- one 6-inch (15-cm) tortilla has 1.5g of fiber.  Green beans (canned) --  cup has 2g of fiber.  Instant oatmeal --  cup has about 2g of fiber.  Long-grain brown rice (cooked) -- 1 cup has 3.5g of fiber.  Macaroni, enriched (cooked) -- 1 cup has 2.5g of fiber.  Melon -- 1 cup has 1.4g of fiber.  Multigrain cereal --  cup has  about 2-4g of fiber.  Orange -- 1 small has 3.1g of fiber.  Potatoes, mashed --  cup has 1.6g of fiber.  Raisins -- 1/4 cup has 1.6g of fiber.  Squash --  cup has 2.9g of fiber.  Sunflower seeds --  cup has 1.1g of fiber.  Tomato -- 1 medium has 1.5g of fiber.  Vegetable or soy patty -- 1 has 3.4g of fiber.  Whole-wheat bread -- 1 slice has 2g of fiber.  Whole-wheat spaghetti --  cup has 3.2g of fiber. Low-fiber foods Low-fiber foods contain less than 1 gram (less than 1g) of fiber per serving. They include:  Egg -- 1 large.  Flour tortilla -- one 6-inch (15-cm) tortilla.  Fruit juice --  cup.  Lettuce -- 1 cup.  Meat, poultry, or fish -- 1 oz.  Milk -- 1 cup.  Spinach (raw) -- 1 cup.  White bread -- 1 slice.  White rice --  cup.  Yogurt --  cup. Actual amounts of fiber in foods may be different depending on processing. Talk with your dietitian about how much fiber you need in your diet. This information is not intended to replace advice given to you by your health care provider. Make sure you discuss any questions  you have with your health care provider. Document Revised: 11/14/2015 Document Reviewed: 05/16/2015 Elsevier Patient Education  [place].

## 2019-09-28 NOTE — Progress Notes (Signed)
 [facility] OFFICE [facility]ISIT  09/28/2019  HPI: Margaret [name] is a 49 y.o. female 15 days s/p robotic cholecystectomy with repair of ventral hernia.  Postoperative complaints of bloating, mild nausea postprandially.  Vital signs: BP 106/74   Pulse 77   Temp (!) 97 F (36.1 C) (Oral)   Resp 12   Ht 5' 5.5" (1.664 m)   Wt 241 lb 9.6 oz (109.6 kg)   LMP 05/07/2016   SpO2 98%   BMI 39.59 kg/m    Physical Exam: Constitutional: Appears well. Abdomen: Benign, soft, nontender. Skin: All incisions are clean, dry and intact, healing well.  Assessment/Plan: This is a 49 y.o. female 15 days s/p robotic cholecystectomy with repair of ventral hernia.  Patient Active Problem List   Diagnosis Date Noted  . Dyspnea on exertion 08/15/2018  . Paroxysmal atrial fibrillation (HCC) 10/29/2016  . PFO (patent foramen ovale) 09/23/2016  . Hyperlipidemia 09/17/2016  . IFG (impaired fasting glucose) 09/17/2016  . History of stroke 09/06/2016  . Myopia 12/20/2014  . Abdominal pain 12/20/2014  . Depression 12/20/2014  . Right arm pain 11/21/2014  . 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 Thyroid activity decreased 10/18/2014  . Abnormal CBC 10/18/2014  . Tobacco abuse 10/18/2014  . Previous known suicide attempt 04/20/2014    -We discussed a return to work schedule, and her avoidance of lifting/weight restrictions.  I believe she understands.  We discussed at length various options regarding dietary supplements, fiber intake and water consumption.  We encouraged to increase her water consumption and fiber intake.  She takes some various vitamin supplements.  Follow-up as needed.   Campbell Lerner M.D., FACS 09/28/2019, 1:24 PM

## 2019-10-20 ENCOUNTER — Telehealth: Payer: Self-pay

## 2019-10-20 NOTE — Telephone Encounter (Signed)
Yes, dismissed for three documented no shows.
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
## 2019-10-20 NOTE — Telephone Encounter (Signed)
Left message advising pt she was dismissed from the practice and will need to establish care with another provider.   Thanks,   -Vernona Rieger

## 2019-10-31 ENCOUNTER — Telehealth: Payer: Self-pay | Admitting: Surgery

## 2019-10-31 NOTE — Telephone Encounter (Signed)
Patient called back and I advise her that julie spoke with Dr Claudine Mouton and to contact her primary care physician

## 2019-10-31 NOTE — Telephone Encounter (Signed)
Called patient with no answer. Left vm to call back to the office.  When patient calls back, she is to be made aware that I have spoke to Dr Claudine Mouton in regards to this and she is to contact her primary care provider.

## 2019-10-31 NOTE — Telephone Encounter (Signed)
Patient is calling complaining of sweating and nausea, restless, slight pain in her stomach. Please call patient and advise.

## 2019-11-28 ENCOUNTER — Ambulatory Visit (INDEPENDENT_AMBULATORY_CARE_PROVIDER_SITE_OTHER): Payer: Medicaid Other | Admitting: Podiatry

## 2019-11-28 ENCOUNTER — Other Ambulatory Visit: Payer: Self-pay

## 2019-11-28 DIAGNOSIS — B351 Tinea unguium: Secondary | ICD-10-CM | POA: Diagnosis not present

## 2019-11-28 DIAGNOSIS — M79674 Pain in right toe(s): Secondary | ICD-10-CM

## 2019-11-28 DIAGNOSIS — M79675 Pain in left toe(s): Secondary | ICD-10-CM

## 2019-11-28 DIAGNOSIS — Z79899 Other long term (current) drug therapy: Secondary | ICD-10-CM

## 2019-11-28 MED ORDER — TERBINAFINE HCL 250 MG PO TABS: 250.0000 mg | ORAL_TABLET | Freq: Every day | ORAL | 0 refills | Status: DC

## 2019-11-28 NOTE — Progress Notes (Signed)
   SUBJECTIVE Patient presents to office today complaining of elongated, thickened nails that cause pain while ambulating in shoes.  She is unable to trim her own nails. Patient is here for further evaluation and treatment.  Past Medical History:  Diagnosis Date  . Anxiety   . Arthritis   . Asthma    controlled  . CVA (cerebral vascular accident) (HCC)    a. 09/2016  . Depression   . Dysrhythmia   . Headache   . Hypertension   . Hypothyroidism   . Morbid obesity (HCC)   . PAF (paroxysmal atrial fibrillation) (HCC) 09/2016   a. 30-day event monitor 6/18: predominant rhythm of sinus with isolated PACs and PVCs.  Two narrow complex tachycardia episodes concerning for A. Fib +/-atrial flutter or SVT.  No prolonged pauses; b. CHADS2VASc = 3 (stroke x 2, female)--> Eliquis.  Marland Kitchen PFO (patent foramen ovale)    a. TTE 6/18: EF of 60 to 65%, normal wall motion, grade 1 diastolic dysfunction; b. TEE 6/18: evidence of a very small atrial level right to left shunt by bubble study without visualization of ASD or PFO  . Substance abuse (HCC)    Past use of cocaine   . Tobacco abuse     OBJECTIVE General Patient is awake, alert, and oriented x 3 and in no acute distress. Derm Skin is dry and supple bilateral. Negative open lesions or macerations. Remaining integument unremarkable. Nails are tender, long, thickened and dystrophic with subungual debris, consistent with onychomycosis, 1-5 bilateral. No signs of infection noted. Vasc  DP and PT pedal pulses palpable bilaterally. Temperature gradient within normal limits.  Neuro Epicritic and protective threshold sensation grossly intact bilaterally.  Musculoskeletal Exam No symptomatic pedal deformities noted bilateral. Muscular strength within normal limits.  ASSESSMENT 1. Onychodystrophic nails 1-5 bilateral with hyperkeratosis of nails.  2. Onychomycosis of nail due to dermatophyte bilateral 3. Pain in foot bilateral  PLAN OF CARE 1. Patient  evaluated today.  2. Instructed to maintain good pedal hygiene and foot care.  3. Mechanical debridement of nails 1-5 bilaterally performed using a nail nipper. Filed with dremel without incident.  4.  Order placed for hepatic function panel  5.  Prescription for Lamisil 250 mg #90.  If LFT is abnormal, discontinue 6.  Return to clinic in 3 mos.    Felecia Shelling, DPM [facility]  Dr. Felecia Shelling, DPM    [address]                                        [place], [place] [address]                Office [phone]  Fax [phone]

## 2019-12-01 ENCOUNTER — Telehealth: Payer: Self-pay | Admitting: Physician Assistant

## 2019-12-01 NOTE — Telephone Encounter (Signed)
Patient is calling to see if her TB skin test is still valid Patient was advised that her last TB test was 04/19/18.

## 2019-12-04 NOTE — Telephone Encounter (Signed)
Typically these are good for 1 yr, correct?

## 2019-12-06 LAB — HEPATIC FUNCTION PANEL
ALT: 21 IU/L (ref 0–32)
AST: 21 IU/L (ref 0–40)
Albumin: 4.1 g/dL (ref 3.8–4.8)
Alkaline Phosphatase: 119 IU/L (ref 48–121)
Bilirubin Total: 0.3 mg/dL (ref 0.0–1.2)
Bilirubin, Direct: 0.1 mg/dL (ref 0.00–0.40)
Total Protein: 6.8 g/dL (ref 6.0–8.5)

## 2019-12-12 ENCOUNTER — Telehealth: Payer: Self-pay

## 2019-12-12 DIAGNOSIS — I480 Paroxysmal atrial fibrillation: Secondary | ICD-10-CM

## 2019-12-12 MED ORDER — APIXABAN 5 MG PO TABS: 5.0000 mg | ORAL_TABLET | Freq: Two times a day (BID) | ORAL | 1 refills | Status: DC

## 2019-12-12 NOTE — Telephone Encounter (Signed)
Prescription refill request for Eliquis received. Indication: PAF Last office visit:08/24/19 Scr: 0.69 Age: 49 Weight: 241 lbs

## 2019-12-12 NOTE — Addendum Note (Signed)
Addended by: Cheree Ditto on: 12/12/2019 01:18 PM   Modules accepted: Orders

## 2019-12-12 NOTE — Telephone Encounter (Signed)
*  STAT* If patient is at the pharmacy, call can be transferred to refill team.   1. Which medications need to be refilled? (please list name of each medication and dose if known) Eliquis 5MG   2. Which pharmacy/location (including street and city if local pharmacy) is medication to be sent to?  3. Do they need a 30 day or 90 day supply? 90

## 2019-12-12 NOTE — Telephone Encounter (Signed)
Refill sent to pharmacy.   

## 2019-12-27 ENCOUNTER — Ambulatory Visit: Payer: Medicaid Other | Admitting: Internal Medicine

## 2019-12-28 ENCOUNTER — Other Ambulatory Visit: Payer: Self-pay

## 2019-12-28 ENCOUNTER — Encounter: Payer: Self-pay | Admitting: Family

## 2019-12-28 ENCOUNTER — Ambulatory Visit (INDEPENDENT_AMBULATORY_CARE_PROVIDER_SITE_OTHER): Payer: Medicaid Other | Admitting: Family

## 2019-12-28 ENCOUNTER — Other Ambulatory Visit: Payer: Self-pay | Admitting: Surgery

## 2019-12-28 VITALS — BP 120/84 | HR 74 | Ht 65.0 in | Wt 244.4 lb

## 2019-12-28 DIAGNOSIS — I480 Paroxysmal atrial fibrillation: Secondary | ICD-10-CM

## 2019-12-28 DIAGNOSIS — R519 Headache, unspecified: Secondary | ICD-10-CM

## 2019-12-28 DIAGNOSIS — Z7901 Long term (current) use of anticoagulants: Secondary | ICD-10-CM

## 2019-12-28 DIAGNOSIS — Z720 Tobacco use: Secondary | ICD-10-CM

## 2019-12-28 NOTE — Patient Instructions (Signed)
Medication Instructions:  No changes  *If you need a refill on your cardiac medications before your next appointment, please call your pharmacy*   Lab Work: No labs needed at this time.  If you have labs (blood work) drawn today and your tests are completely normal, you will receive your results only by: Marland Kitchen MyChart Message (if you have MyChart) OR . A paper copy in the mail If you have any lab test that is abnormal or we need to change your treatment, we will call you to review the results.   Testing/Procedures: None   Follow-Up: At [facility], you and your health needs are our priority.  As part of our continuing mission to provide you with exceptional heart care, we have created designated Provider Care Teams.  These Care Teams include your primary Cardiologist (physician) and Advanced Practice Providers (APPs -  Physician Assistants and Nurse Practitioners) who all work together to provide you with the care you need, when you need it.  We recommend signing up for the patient portal called "MyChart".  Sign up information is provided on this After Visit Summary.  MyChart is used to connect with patients for Virtual Visits (Telemedicine).  Patients are able to view lab/test results, encounter notes, upcoming appointments, etc.  Non-urgent messages can be sent to your provider as well.   To learn more about what you can do with MyChart, go to [email or web address].    Your next appointment:   3 month(s)  The format for your next appointment:   In Person  Provider:   You may see [name], MD or one of the following Advanced Practice Providers on your designated Care Team:    Nicolasa Ducking, NP  Eula Listen, PA-C  Marisue Ivan, PA-C  Cadence [place], [place]

## 2019-12-28 NOTE — Progress Notes (Signed)
Office Visit    Patient Name: Margaret [name] Date of Encounter: 12/28/2019  Primary Care Provider:  [name], FNP  - New patient appt upcoming Primary Cardiologist:  [name], MD Electrophysiologist:  None   Chief Complaint    Margaret [name] is a 49 y.o. female with a hx of PAF on Eliquis, PFO, hypothyroidism, asthma, remote history of polysubstance abuse, tobacco abuse, COVID 19 (05/2019) presents today for headache.   Past Medical History    Past Medical History:  Diagnosis Date  . Anxiety   . Arthritis   . Asthma    controlled  . CVA (cerebral vascular accident) (HCC)    a. 09/2016  . Depression   . Dysrhythmia   . Headache   . Hypertension   . Hypothyroidism   . Morbid obesity (HCC)   . PAF (paroxysmal atrial fibrillation) (HCC) 09/2016   a. 30-day event monitor 6/18: predominant rhythm of sinus with isolated PACs and PVCs.  Two narrow complex tachycardia episodes concerning for A. Fib +/-atrial flutter or SVT.  No prolonged pauses; b. CHADS2VASc = 3 (stroke x 2, female)--> Eliquis.  Marland Kitchen PFO (patent foramen ovale)    a. TTE 6/18: EF of 60 to 65%, normal wall motion, grade 1 diastolic dysfunction; b. TEE 6/18: evidence of a very small atrial level right to left shunt by bubble study without visualization of ASD or PFO  . Substance abuse (HCC)    Past use of cocaine   . Tobacco abuse    Past Surgical History:  Procedure Laterality Date  . FRACTURE SURGERY    . TEE WITHOUT CARDIOVERSION N/A 09/08/2016   Procedure: TRANSESOPHAGEAL ECHOCARDIOGRAM (TEE);  Surgeon: [name], MD;  Location: [facility] ORS;  Service: Cardiovascular;  Laterality: N/A;  . XI ROBOTIC ASSISTED VENTRAL HERNIA N/A 09/13/2019   Procedure: XI ROBOTIC ASSISTED EPIGATRIC HERNIA;  Surgeon: Campbell Lerner, MD;  Location: [facility] ORS;  Service: General;  Laterality: N/A;    Allergies  No Known Allergies  History of Present Illness    Margaret [name] is a 50 y.o. female with a hx of  PAF on  Eliquis, PFO, hypothyroidism, asthma, remote history of polysubstance abuse (cocaine, marijuana), tobacco abuse, COVID 19 (05/2019)  last seen 08/24/19 by Ward Givens, NP.  She was hospitalized 09/2016 due to slurred speech and right frontal lobe stroke. Echo with normal LVEF, gr1DD. Carotid duplex <50% bilateral internal carotid artery stenoses. TEE with small atrial level right to left shunt consistent with PFO. Followed by 30 day monitor with 2 episodes of narrow complex tachycardia concerning for atrial flutter/fibrillation. She was placed on Xarelto and beta blocker therapy and later switched from Xarelto to Eliquis.  Seen 03/2019 and had returned to work as [employer]. 05/2019 tested positive for COVID19 on routine preop swab prior to planned knee arthroscopy. Surgery cancelled and treated outpatient with bamlanivimab.   Seen in ED 08/22/19 for pain in neck and should and intermittent paresthesias from left elbow down to her fingertips on left hand. CT head normal. Shoulder pain reproducible with palpation. Discharged on Robaxin and Fioricet.   Seen in follow up 08/24/19 nothing fatigue and left arm pain. She was offered lab work but preferred to have it through her PCP.   Since last seen she has undergone robotic cholecystectomy with repair of ventral hernia. Reports her abdominal pain has improved.   Chief complaint today of migraine headache for 1.5 months. Tells me this is similar to symptoms prior to CVA  in 2018. Tells me she takes Tylenol and Ibuprofen which give some relief. Takes them only when she needs them and not regularly per her report. Tells me it has been every day. Reports they have been worsening.   Reports an occasional blurry vision at night time for the last 1.5-2 months. Reports pain in her left hip going to her foot but no numbness nor weakness to her extremities. Reports "a little slur" in her speech though on exam today no slurred speech noted.   Drinks [employer] throughout the  day or Dr. Reino Kent. Only drinks water with her medications. Tells me she often skips meals.   Reports "a little bit of" body aches which has been going on for a week and a half. 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 Specifically, no hemorrhage, hydrocephalus, mass lesion, acute infarction, or significant intracranial injury.   Vascular: No hyperdense vessel or unexpected calcification.   Skull: No acute calvarial abnormality.   Sinuses/Orbits: Visualized paranasal sinuses and mastoids clear. Orbital soft tissues unremarkable.   Other: None   IMPRESSION: No acute intracranial abnormality.  EKG:  No EKG today.  Recent Labs: 09/05/2019: Platelets 353 09/13/2019: BUN 7; Creatinine, Ser 0.70; Hemoglobin 12.6; Potassium 3.4; Sodium 143 12/05/2019: ALT 21  Recent Lipid Panel    Component Value Date/Time   CHOL 135 02/24/2018 1130   CHOL 140 09/04/2013 0405   TRIG 161 (H) 02/24/2018 1130   TRIG 137 09/04/2013 0405   HDL 37 (L) 02/24/2018 1130   HDL 30 (L) 09/04/2013 0405   CHOLHDL 3.6 02/24/2018 1130   CHOLHDL 5.1 09/07/2016 0442   VLDL 11 09/07/2016 0442   VLDL 27 09/04/2013 0405   LDLCALC 66 02/24/2018 1130   LDLCALC 83 09/04/2013 0405    Home Medications   Current Meds  Medication Sig  . apixaban (ELIQUIS) 5 MG TABS tablet Take 1 tablet (5 mg total) by mouth 2 (two) times daily.  Marland Kitchen atorvastatin (LIPITOR) 40 MG tablet Take 1 tablet (40 mg total) by mouth daily.  . ciclopirox (PENLAC) 8 % solution Apply 1 application topically daily. Put on toes for 7 days then wipe off  . HYDROcodone-acetaminophen (NORCO/VICODIN) 5-325 MG tablet Take 1 tablet by mouth every 6 (six) hours as needed for moderate pain.  Marland Kitchen levothyroxine (SYNTHROID) 100 MCG tablet Take 1 tablet (100 mcg  total) by mouth daily.  . metoprolol tartrate (LOPRESSOR) 25 MG tablet Take 1 tablet (25 mg total) by mouth 2 (two) times daily.  . prazosin (MINIPRESS) 2 MG capsule Take 2 mg by mouth daily as needed (Sleep). In the evening  . sertraline (ZOLOFT) 100 MG tablet Take 1 tablet (100 mg total) by mouth daily.  Marland Kitchen triamcinolone cream (KENALOG) 0.1 % Apply 1 application topically 2 (two) times daily.  . valACYclovir (VALTREX) 1000 MG tablet Take 1 tablet (1,000 mg total) by mouth 2 (two) times daily.    Review of Systems    Review of Systems  Constitutional: Negative for chills, fever and malaise/fatigue.       (+) headache  Cardiovascular: Negative for chest pain, dyspnea on exertion, irregular heartbeat, leg swelling, near-syncope, orthopnea, palpitations and syncope.  Respiratory: Negative for cough, shortness of breath and wheezing.   Musculoskeletal:       (+)  body aches "a little bit"  Gastrointestinal: Negative for melena, nausea and vomiting.  Genitourinary: Negative for hematuria.  Neurological: Negative for dizziness, light-headedness and weakness.   All other systems reviewed and are otherwise negative except as noted above.  Physical Exam    VS:  BP 120/84 (BP Location: Left Arm, Patient Position: Sitting, Cuff Size: Large)   Pulse 74   Ht 5\' 5"  (1.651 m)   Wt 244 lb 6 oz (110.8 kg)   LMP 05/07/2016   SpO2 99%   BMI 40.67 kg/m  , BMI Body mass index is 40.67 kg/m. GEN: Well nourished, overweight, well developed, in no acute distress. HEENT: normal. Neck: Supple, no JVD, carotid bruits, or masses. Cardiac: RRR, no murmurs, rubs, or gallops. No clubbing, cyanosis, edema.  Radials/DP/PT 2+ and equal bilaterally.  Respiratory:  Respirations regular and unlabored, clear to auscultation bilaterally. GI: Soft, nontender, nondistended, BS + x 4. MS: No deformity or atrophy. Skin: Warm and dry, no rash. Neuro:  Strength and sensation are intact. Cranial nerves II-XII grossly  intact.  Bilateral upper and lower extremities with equal strength. No aphasia, slurred speech.  Psych: Normal affect.  Assessment & Plan    1. Headache - Reports 92-month history of daily headache which is worsening.  She reports no known history of migraine.  Denies aura.  Using Tylenol and ibuprofen intermittently with some relief.  Notably she only drinks Dr. 3-month and [facility], anticipate dehydration is contributory.  Does endorse stress with her job.  She is somewhat concerned she reports her symptom onset of her stroke in 2018 was associated with headache.  Her blood pressure is normal in clinic today.  Her neurological exam is normal.  She had CT head 08/2019 with no acute findings.  We did discuss that if she has continued headaches without relief we would recommend a repeat CT scan though she tells me she is "not waiting 5 hours in the ED "and was very dissatisfied when we discussed that we do not order outpatient CT heads in the cardiology setting.  Reviewed ED return precautions.  Reviewed need for increased hydration.  Discussed that she may use Tylenol or ibuprofen as needed.  Encouraged to contact her new PCP office to see if they can get her an earlier appointment.  2. PAF - No evidence of recurrence. Continue Eliquis 5mg  BID. Does not meet criteria for reduced dose.   3. Hx of CVA -continue statin, Eliquis.  No aspirin secondary to Eliquis.  Neurological exam with no acute findings today.  4. PFO - Small PFI on TEE at time of CVA 2018. Remains on lifelong anticoagulation in setting of atrial fib, no intervention at this time.   5. Tobacco and marijuana use - Smoking cessation encouraged. Recommend utilization of 1800QUITNOW.  6. Hypothyroidism - Continue to follow with PCP.   7. Morbid obesity - Weight loss via diet and exercise encouraged.   8. HLD, LDL goal <70 - Continue Atorvastatin 40mg  daily. Due for repeat lipid panel, she politely declines today. Anticipate it will be  collected at upcoming new patient appt with PCP.   Disposition: Follow up in 3 month(s) with Dr. or APP   2019, NP 12/28/2019, 10:28 AM

## 2020-01-22 ENCOUNTER — Other Ambulatory Visit (HOSPITAL_COMMUNITY)
Admission: RE | Admit: 2020-01-22 | Discharge: 2020-01-22 | Disposition: A | Discharge status: Home / Self Care | Payer: Medicaid Other | Source: Ambulatory Visit | Attending: Family Medicine | Admitting: Family Medicine

## 2020-01-22 ENCOUNTER — Other Ambulatory Visit: Payer: Self-pay

## 2020-01-22 ENCOUNTER — Encounter: Payer: Self-pay | Admitting: Family Medicine

## 2020-01-22 ENCOUNTER — Ambulatory Visit (INDEPENDENT_AMBULATORY_CARE_PROVIDER_SITE_OTHER): Payer: Medicaid Other | Admitting: Family Medicine

## 2020-01-22 VITALS — BP 124/94 | HR 82 | Temp 98.5°F | Ht 65.5 in | Wt 247.4 lb

## 2020-01-22 DIAGNOSIS — E039 Hypothyroidism, unspecified: Secondary | ICD-10-CM | POA: Diagnosis not present

## 2020-01-22 DIAGNOSIS — Z113 Encounter for screening for infections with a predominantly sexual mode of transmission: Secondary | ICD-10-CM | POA: Diagnosis not present

## 2020-01-22 DIAGNOSIS — Z7689 Persons encountering health services in other specified circumstances: Secondary | ICD-10-CM | POA: Diagnosis not present

## 2020-01-22 DIAGNOSIS — R7989 Other specified abnormal findings of blood chemistry: Secondary | ICD-10-CM

## 2020-01-22 DIAGNOSIS — E782 Mixed hyperlipidemia: Secondary | ICD-10-CM | POA: Diagnosis not present

## 2020-01-22 DIAGNOSIS — L918 Other hypertrophic disorders of the skin: Secondary | ICD-10-CM | POA: Insufficient documentation

## 2020-01-22 DIAGNOSIS — Z8673 Personal history of transient ischemic attack (TIA), and cerebral infarction without residual deficits: Secondary | ICD-10-CM

## 2020-01-22 DIAGNOSIS — Z1159 Encounter for screening for other viral diseases: Secondary | ICD-10-CM | POA: Insufficient documentation

## 2020-01-22 DIAGNOSIS — R519 Headache, unspecified: Secondary | ICD-10-CM

## 2020-01-22 NOTE — Assessment & Plan Note (Signed)
Has been managing headaches with acetaminophen.  Reports migraines when she had met with her cardiology provider on 12/28/2019 that they were present for approx 1.5 months.  Reports headaches have improved since then.    Plan: 1. See Hx of stroke A/P

## 2020-01-22 NOTE — Assessment & Plan Note (Signed)
Status unknown.  Recheck labs.  Continue meds without changes today.  Followup after labs.  

## 2020-01-22 NOTE — Assessment & Plan Note (Signed)
Labs to be drawn for evaluation of lipids.  Current smoker.  Has not met with Neurology since her stroke in 2018, but does report an increase in headaches.  Will refer to establish and for evaluation of new headaches after stroke.  Plan: 1. Referral to Neurology placed

## 2020-01-22 NOTE — Patient Instructions (Signed)
Have your labs drawn and we will contact you with the results.  We will plan to see you back in 1 month for your physical and PAP testing  You will receive a survey after today's visit either digitally by e-mail or paper by USPS mail. Your experiences and feedback matter to [place].  Please respond so we know how we are doing as we provide care for you.  Call us with any questions/concerns/needs.  It is my goal to be available to you for your health concerns.  Thanks for choosing me to be a partner in your healthcare needs!  Charlaine Dalton, FNP-C Family Nurse Practitioner [facility] Phone: [phone]

## 2020-01-22 NOTE — Assessment & Plan Note (Signed)
Abnormal WBC on last labs 05/2019.  Will have repeat labs drawn today.

## 2020-01-22 NOTE — Assessment & Plan Note (Signed)
Requested.  Urine and blood sent to lab for evaluation of HIV, RPR, GC/CT/Trich.  Will call when results are received.

## 2020-01-22 NOTE — Assessment & Plan Note (Signed)
Skin tags noted under bilateral breasts, along line of friction from underwire on bra.  Requesting referral to dermatology for removal.    Plan: 1. Referral to dermatology placed

## 2020-01-22 NOTE — Progress Notes (Signed)
Subjective:    Patient ID: Margaret [name], female    DOB: 1970-04-12, 49 y.o.   MRN: [id number]  Margaret [name] is a 49 y.o. female presenting on 01/22/2020 for Establish Care   HPI  Ms. Funches presents to clinic for new patient establishment for primary care.  Previous PCP was at [facility].  Records will not be requested, as records are available in EMR.  Past medical, family, and surgical history reviewed w/ pt.  She has acute concerns today for a referral to dermatology for skin tag removal.   Depression screen [facility] 2/9 03/19/2018 02/24/2018 10/29/2016  Decreased Interest _0 Down, Depressed, Hopeless _1 PHQ - 2 Score _2 Altered sleeping - 3 3  Tired, decreased energy - 2 3  Change in appetite - 2 3  Feeling bad or failure about yourself  - 1 2  Trouble concentrating - 3 2  Moving slowly or fidgety/restless - 1 2  Suicidal thoughts - 0 0  PHQ-9 Score - 14 19  Difficult doing work/chores - Very difficult -    Social History   Tobacco Use  . Smoking status: Current Every Day Smoker    Packs/day: 0.25    Types: Cigarettes  . Smokeless tobacco: Former Systems developer    Types: Snuff  . Tobacco comment: trying to quit; going to start using patches;   Vaping Use  . Vaping Use: Never used  Substance Use Topics  . Alcohol use: Not Currently    Alcohol/week: 0.0 standard drinks    Comment: on occasion  . Drug use: Not Currently    Types: Marijuana    Comment: Previously, but stopped years ago    Review of Systems  Constitutional: Negative.   HENT: Negative.   Eyes: Negative.   Respiratory: Negative.   Cardiovascular: Negative.   Gastrointestinal: Negative.   Endocrine: Negative.   Genitourinary: Negative.   Musculoskeletal: Negative.   Skin: Negative.        Skin tags under bilateral breasts  Allergic/Immunologic: Negative.   Neurological: Negative.   Hematological: Negative.   Psychiatric/Behavioral: Negative.    Per HPI unless specifically  indicated above     Objective:    BP (!) 124/94 (BP Location: Right Arm, Patient Position: Sitting, Cuff Size: Large)   Pulse 82   Temp 98.5 F (36.9 C) (Oral)   Ht 5' 5.5" (1.664 m)   Wt 247 lb 6.4 oz (112.2 kg)   LMP 05/07/2016   BMI 40.54 kg/m   Wt Readings from Last 3 Encounters:  01/22/20 247 lb 6.4 oz (112.2 kg)  12/28/19 244 lb 6 oz (110.8 kg)  09/28/19 241 lb 9.6 oz (109.6 kg)    Physical Exam Vitals and nursing note reviewed.  Constitutional:      General: She is not in acute distress.    Appearance: Normal appearance. She is well-developed and well-groomed. She is not ill-appearing or toxic-appearing.  HENT:     Head: Normocephalic and atraumatic.     Nose:     Comments: Lizbeth Bark is in place, covering mouth and nose. Eyes:     General: Lids are normal. Vision grossly intact.        Right eye: No discharge.        Left eye: No discharge.     Extraocular Movements: Extraocular movements intact.     Conjunctiva/sclera: Conjunctivae normal.     Pupils: Pupils are equal, round, and reactive to light.  Cardiovascular:     Rate and Rhythm: Normal rate and regular rhythm.     Pulses: Normal pulses.     Heart sounds: Normal heart sounds. No murmur heard.  No friction rub. No gallop.   Pulmonary:     Effort: Pulmonary effort is normal. No respiratory distress.     Breath sounds: Normal breath sounds.  Skin:    General: Skin is warm and dry.     Capillary Refill: Capillary refill takes less than 2 seconds.          Comments: Skin tags under bilateral breasts.  Appears to be lipoma near right elbow, right mid thigh  Neurological:     General: No focal deficit present.     Mental Status: She is alert and oriented to person, place, and time.     Cranial Nerves: No cranial nerve deficit.     Motor: No weakness.     Gait: Gait normal.  Psychiatric:        Attention and Perception: Attention and perception normal.        Mood and Affect: Mood and affect normal.         Speech: Speech normal.        Behavior: Behavior normal. Behavior is cooperative.        Thought Content: Thought content normal.        Cognition and Memory: Cognition and memory normal.        Judgment: Judgment normal.    Results for orders placed or performed in visit on 11/28/19  Hepatic Function Panel  Result Value Ref Range   Total Protein 6.8 6.0 - 8.5 g/dL   Albumin 4.1 3.8 - 4.8 g/dL   Bilirubin Total 0.3 0.0 - 1.2 mg/dL   Bilirubin, Direct 0.10 0.00 - 0.40 mg/dL   Alkaline Phosphatase 119 48 - 121 IU/L   AST 21 0 - 40 IU/L   ALT 21 0 - 32 IU/L      Assessment & Plan:   Problem List Items Addressed This Visit      Endocrine   Thyroid activity decreased    Status unknown.  Recheck labs.  Continue meds without changes today.  Followup after labs.       Relevant Orders   COMPLETE METABOLIC PANEL WITH GFR   TSH + free T4     Musculoskeletal and Integument   Skin tag    Skin tags noted under bilateral breasts, along line of friction from underwire on bra.  Requesting referral to dermatology for removal.    Plan: 1. Referral to dermatology placed      Relevant Orders   Ambulatory referral to Dermatology     Other   Abnormal CBC    Abnormal WBC on last labs 05/2019.  Will have repeat labs drawn today.      Relevant Orders   CBC with Differential   COMPLETE METABOLIC PANEL WITH GFR   History of stroke    Labs to be drawn for evaluation of lipids.  Current smoker.  Has not met with Neurology since her stroke in 2018, but does report an increase in headaches.  Will refer to establish and for evaluation of new headaches after stroke.  Plan: 1. Referral to Neurology placed      Relevant Orders   Ambulatory referral to Neurology   Hyperlipidemia    Status unknown.  Recheck labs.  Followup after labs.       Relevant Orders   COMPLETE METABOLIC PANEL  WITH GFR   Lipid Profile   Encounter to establish care with new doctor - Primary    New patient  establishment to [facility] for primary care.  Reviewed chart and care gaps.  Requesting STI testing today.    Plan: 1. Labs to be drawn and urine to be sent out for evaluation 2. RTC in 4 weeks for CPE and PAP testing      Encounter for hepatitis C screening test for low risk patient    Patient requested screening.  Plan: 1. Have labs drawn today      Relevant Orders   Hepatitis C Antibody   Screening examination for sexually transmitted disease    Requested.  Urine and blood sent to lab for evaluation of HIV, RPR, GC/CT/Trich.  Will call when results are received.      Relevant Orders   HIV antibody (with reflex)   RPR   Urine cytology ancillary only   Nonintractable headache    Has been managing headaches with acetaminophen.  Reports migraines when she had met with her cardiology provider on 12/28/2019 that they were present for approx 1.5 months.  Reports headaches have improved since then.    Plan: 1. See Hx of stroke A/P      Relevant Orders   Ambulatory referral to Neurology      No orders of the defined types were placed in this encounter.   Follow up plan: Return in about 4 weeks (around 02/19/2020) for CPE & PAP testing.   Harlin Rain, [place] Family Nurse Practitioner [facility] 01/22/2020, 11:58 AM

## 2020-01-22 NOTE — Assessment & Plan Note (Signed)
New patient establishment to [facility] for primary care.  Reviewed chart and care gaps.  Requesting STI testing today.    Plan: 1. Labs to be drawn and urine to be sent out for evaluation 2. RTC in 4 weeks for CPE and PAP testing

## 2020-01-22 NOTE — Assessment & Plan Note (Signed)
Status unknown.  Recheck labs.  Followup after labs.  

## 2020-01-22 NOTE — Assessment & Plan Note (Signed)
Patient requested screening.  Plan: 1. Have labs drawn today

## 2020-01-24 LAB — URINE CYTOLOGY ANCILLARY ONLY
Chlamydia: NEGATIVE
Comment: NEGATIVE
Comment: NEGATIVE
Comment: NORMAL
Neisseria Gonorrhea: NEGATIVE
Trichomonas: NEGATIVE

## 2020-01-25 LAB — CBC WITH DIFFERENTIAL/PLATELET
Absolute Monocytes: 637 cells/uL (ref 200–950)
Basophils Absolute: 54 cells/uL (ref 0–200)
Basophils Relative: 0.5 %
Eosinophils Absolute: 194 cells/uL (ref 15–500)
Eosinophils Relative: 1.8 %
HCT: 38 % (ref 35.0–45.0)
Hemoglobin: 12.2 g/dL (ref 11.7–15.5)
Lymphs Abs: 4028 cells/uL — ABNORMAL HIGH (ref 850–3900)
MCH: 27.4 pg (ref 27.0–33.0)
MCHC: 32.1 g/dL (ref 32.0–36.0)
MCV: 85.2 fL (ref 80.0–100.0)
MPV: 11 fL (ref 7.5–12.5)
Monocytes Relative: 5.9 %
Neutro Abs: 5886 cells/uL (ref 1500–7800)
Neutrophils Relative %: 54.5 %
Platelets: 405 10*3/uL — ABNORMAL HIGH (ref 140–400)
RBC: 4.46 10*6/uL (ref 3.80–5.10)
RDW: 13.8 % (ref 11.0–15.0)
Total Lymphocyte: 37.3 %
WBC: 10.8 10*3/uL (ref 3.8–10.8)

## 2020-01-25 LAB — COMPLETE METABOLIC PANEL WITH GFR
AG Ratio: 1.4 (calc) (ref 1.0–2.5)
ALT: 16 U/L (ref 6–29)
AST: 18 U/L (ref 10–35)
Albumin: 4 g/dL (ref 3.6–5.1)
Alkaline phosphatase (APISO): 107 U/L (ref 31–125)
BUN: 9 mg/dL (ref 7–25)
CO2: 28 mmol/L (ref 20–32)
Calcium: 9.5 mg/dL (ref 8.6–10.2)
Chloride: 105 mmol/L (ref 98–110)
Creat: 0.78 mg/dL (ref 0.50–1.10)
GFR, Est African American: 104 mL/min/{1.73_m2} (ref >=60)
GFR, Est Non African American: 90 mL/min/{1.73_m2} (ref >=60)
Globulin: 2.8 g/dL (calc) (ref 1.9–3.7)
Glucose, Bld: 102 mg/dL — ABNORMAL HIGH (ref 65–99)
Potassium: 4 mmol/L (ref 3.5–5.3)
Sodium: 140 mmol/L (ref 135–146)
Total Bilirubin: 0.4 mg/dL (ref 0.2–1.2)
Total Protein: 6.8 g/dL (ref 6.1–8.1)

## 2020-01-25 LAB — LIPID PANEL
Cholesterol: 127 mg/dL (ref <200)
HDL: 38 mg/dL — ABNORMAL LOW (ref >=50)
LDL Cholesterol (Calc): 67 mg/dL (calc)
Non-HDL Cholesterol (Calc): 89 mg/dL (calc) (ref <130)
Total CHOL/HDL Ratio: 3.3 (calc) (ref <5.0)
Triglycerides: 134 mg/dL (ref <150)

## 2020-01-25 LAB — HIV ANTIBODY (ROUTINE TESTING W REFLEX): HIV 1&2 Ab, 4th Generation: NONREACTIVE

## 2020-01-25 LAB — HEMOGLOBIN A1C W/OUT EAG: Hgb A1c MFr Bld: 6.1 % of total Hgb — ABNORMAL HIGH (ref <5.7)

## 2020-01-25 LAB — RPR: RPR Ser Ql: NONREACTIVE

## 2020-01-25 LAB — HEPATITIS C ANTIBODY
Hepatitis C Ab: NONREACTIVE
SIGNAL TO CUT-OFF: 0.02 (ref <1.00)

## 2020-01-25 LAB — TSH+FREE T4: TSH W/REFLEX TO FT4: 4.19 mIU/L

## 2020-02-19 ENCOUNTER — Encounter: Payer: Medicaid Other | Admitting: Family Medicine

## 2020-02-23 ENCOUNTER — Ambulatory Visit: Payer: Medicaid Other

## 2020-02-26 ENCOUNTER — Ambulatory Visit: Payer: Medicaid Other

## 2020-02-26 ENCOUNTER — Ambulatory Visit (LOCAL_COMMUNITY_HEALTH_CENTER): Payer: Medicaid Other | Admitting: Physician Assistant

## 2020-02-26 ENCOUNTER — Encounter: Payer: Self-pay | Admitting: Physician Assistant

## 2020-02-26 ENCOUNTER — Other Ambulatory Visit: Payer: Self-pay

## 2020-02-26 VITALS — BP 134/89 | Ht 65.0 in | Wt 251.0 lb

## 2020-02-26 DIAGNOSIS — Z01419 Encounter for gynecological examination (general) (routine) without abnormal findings: Secondary | ICD-10-CM | POA: Diagnosis not present

## 2020-02-26 DIAGNOSIS — Z30018 Encounter for initial prescription of other contraceptives: Secondary | ICD-10-CM | POA: Diagnosis not present

## 2020-02-26 DIAGNOSIS — Z3009 Encounter for other general counseling and advice on contraception: Secondary | ICD-10-CM

## 2020-02-26 DIAGNOSIS — Z113 Encounter for screening for infections with a predominantly sexual mode of transmission: Secondary | ICD-10-CM

## 2020-02-26 NOTE — Progress Notes (Signed)
Patient here for physical. Declines answering most questions on physical for or flow sheet. Patient reports last time she took birth control pills she had a stroke. When RN asked further questions patient answers did not match what I was asking or she would refuse to answer.   Harvie Heck, RN  Post:  RN counseled patient that TR take 2-3 weeks to come in and we only call for positive. Patient requested AVS, RN provided. Patient sent to lab and ok to go home afterwards.   Harvie Heck, RN

## 2020-02-28 ENCOUNTER — Encounter: Payer: Self-pay | Admitting: Physician Assistant

## 2020-02-28 NOTE — Progress Notes (Signed)
Family Planning Visit- Repeat Yearly Visit  Subjective:  Margaret [name] is a 49 y.o. No obstetric history on file.  being seen today for an well woman visit and to discuss family planning options.    She is currently using Condoms sometimes for pregnancy prevention. Patient reports she does not  want a pregnancy in the next year. Patient  has Thyroid activity decreased; Abnormal CBC; Tobacco abuse; Right arm pain; Myopia; Abdominal pain; Depression; History of stroke; Hyperlipidemia; IFG (impaired fasting glucose); PFO (patent foramen ovale); Paroxysmal atrial fibrillation (HCC); Dyspnea on exertion; 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; Previous known suicide attempt; Status post laparoscopic cholecystectomy; Multinodular goiter; Encounter to establish care with new doctor; Encounter for hepatitis C screening test for low risk patient; Screening examination for sexually transmitted disease; Skin tag; and Nonintractable headache on their problem list.  Chief Complaint  Patient presents with  . Contraception    physical    Patient reports that she does not want any hormonal BCM.   States that she is wondering why she has not had a period for 2 years.  Reports that she is having some symptoms of depression and has called to make an appointment with a counselor.  Denies any suicidal or homicidal ideation or plan.  States that she wants a physical today as well as STD screening.  States that the blurry vision and dizziness are related to her migraine headaches.  Denies sore throat today.  Reports that she has had thyroid disorder in the past and that it is time for her to see her PCP to check on her levels.  Per chart review, CBE and pap are due now.  Patient denies any other concerns today.    See flowsheet for other program required questions.   Body mass index is 41.77 kg/m. - Patient is eligible for diabetes screening based on BMI and age >1?  yes HA1C ordered? No,  patient declines.  Patient reports 1 partner in last year. Desires STI screening?  Yes   Has patient been screened once for HCV in the past?  No  No results found for: HCVAB  Does the patient have current of drug use, have a partner with drug use, and/or has been incarcerated since last result? No  If yes-- Screen for HCV through [facility]   Does the patient meet criteria for HBV testing? No  Criteria:  -Household, sexual or needle sharing contact with HBV -History of drug use -HIV positive -Those with known Hep C   Health Maintenance Due  Topic Date Due  . COVID-19 Vaccine (1) Never done  . PAP SMEAR-Modifier  06/06/2019  . INFLUENZA VACCINE  11/05/2019    Review of Systems  All other systems reviewed and are negative.   The following portions of the patient's history were reviewed and updated as appropriate: allergies, current medications, past family history, past medical history, past social history, past surgical history and problem list. Problem list updated.  Objective:   Vitals:   02/26/20 1417  BP: 134/89  Weight: 251 lb (113.9 kg)  Height: 5\' 5"  (1.651 m)    Physical Exam Vitals and nursing note reviewed.  Constitutional:      General: She is not in acute distress.    Appearance: Normal appearance.  HENT:     Head: Normocephalic and atraumatic.     Mouth/Throat:     Mouth: Mucous membranes are moist.     Pharynx: Oropharynx is clear. No oropharyngeal  exudate or posterior oropharyngeal erythema.  Eyes:     Conjunctiva/sclera: Conjunctivae normal.  Neck:     Thyroid: Thyromegaly present. No thyroid mass or thyroid tenderness.  Cardiovascular:     Rate and Rhythm: Normal rate and regular rhythm.  Pulmonary:     Effort: Pulmonary effort is normal.     Breath sounds: Normal breath sounds.  Chest:     Breasts:        Right: Normal. No mass, nipple discharge, skin change or tenderness.        Left: Normal. No mass, nipple discharge, skin change or  tenderness.  Abdominal:     Palpations: Abdomen is soft. There is no mass.     Tenderness: There is no abdominal tenderness. There is no guarding or rebound.  Genitourinary:    General: Normal vulva.     Rectum: Normal.  Musculoskeletal:     Cervical back: Neck supple. No tenderness.  Lymphadenopathy:     Cervical: No cervical adenopathy.     Upper Body:     Right upper body: No supraclavicular, axillary or pectoral adenopathy.     Left upper body: No supraclavicular, axillary or pectoral adenopathy.  Skin:    General: Skin is warm and dry.     Findings: No bruising, erythema, lesion or rash.  Neurological:     Mental Status: She is alert and oriented to person, place, and time.  Psychiatric:        Mood and Affect: Mood normal.        Behavior: Behavior normal.        Thought Content: Thought content normal.        Judgment: Judgment normal.       Assessment and Plan:  [name] is a 49 y.o. female No obstetric history on file. presenting to the [facility] Department for an yearly well woman exam/family planning visit  Contraception counseling: Reviewed all forms of birth control options in the tiered based approach. available including abstinence; over the counter/barrier methods; hormonal contraceptive medication including pill, patch, ring, injection,contraceptive implant, ECP; hormonal and nonhormonal IUDs; permanent sterilization options including vasectomy and the various tubal sterilization modalities. Risks, benefits, and typical effectiveness rates were reviewed.  Questions were answered.  Written information was also given to the patient to review.  Patient desires to continue with condoms sometimes, this was prescribed for patient. She will follow up in  1 year and prn for surveillance.  She was told to call with any further questions, or with any concerns about this method of contraception.  Emphasized use of condoms 100% of the time for STI  prevention.  Patient was offered ECP. ECP was not accepted by the patient. ECP counseling was given.   1. Encounter for counseling regarding contraception Counseled patient to use condoms with all sex for STD and pregnancy prevention. Counseled that Paragard IUD is a non-hormonal option if she changes her mind.   2. Screening for STD (sexually transmitted disease) Await test results.  Counseled taht RN will call if needs to RTC for treatment once results are back.  - HIV [place] [facility] - Syphilis Serology, [place] [facility] - Chlamydia/Gonorrhea [place] [facility]  3. Encounter for initial prescription of other contraceptives Enc condoms with all sex and also discussed  OTC spermicide use for added effectiveness.  4. Well woman exam with routine gynecological exam Reviewed with patient healthy habits to maintain general health. Enc MVI 1 po daily. Enc patient to keep  appointment with counselor and offered crisis # for use in interim, patient declines and states she has a number to call. Enc patient to follow up with PCP re: thyroid levels and her periods.  Counseled that the PCP could likely also do blood work to determine if it is due to her thyroid that she has not had periods or whether she has already gone through early menopause. Enc to establish with/ follow up with PCP for primary care concerns, age appropriate screenings and illness. Await pap results.  Counseled that RN will call or send letter once results are back.  - IGP, Aptima HPV     Return in about 1 year (around 02/25/2021) for RP and prn.  Future Appointments  Date Time Provider Department Center  03/04/2020  3:00 PM [name], FNP [facility]  04/03/2020  2:20 PM End, Cristal Deer, MD [facility]    Matt Holmes, [place]

## 2020-02-29 LAB — IGP, APTIMA HPV
HPV Aptima: NEGATIVE
PAP Smear Comment: 0

## 2020-03-04 ENCOUNTER — Encounter: Payer: Medicaid Other | Admitting: Family Medicine

## 2020-03-27 ENCOUNTER — Other Ambulatory Visit: Payer: Self-pay | Admitting: Physician Assistant

## 2020-03-28 ENCOUNTER — Other Ambulatory Visit: Payer: Self-pay | Admitting: Family Medicine

## 2020-03-28 MED ORDER — LEVOTHYROXINE SODIUM 100 MCG PO TABS
100.0000 ug | ORAL_TABLET | Freq: Every day | ORAL | 0 refills | Status: DC
Start: 2020-03-28 — End: 2020-10-24

## 2020-03-28 NOTE — Telephone Encounter (Signed)
Pt called stating that she was not aware that she was dismissed from the practice and is requesting to have someone give her a call back. Please advise.

## 2020-03-28 NOTE — Addendum Note (Signed)
Addended by: Lonna Cobb on: 03/28/2020 02:16 PM   Modules accepted: Orders

## 2020-03-28 NOTE — Telephone Encounter (Signed)
I called patient and she is reporting that when she went to the pharmacy they would not refill her thyroid medication. [facility].

## 2020-04-03 ENCOUNTER — Encounter: Payer: Self-pay | Admitting: Internal Medicine

## 2020-04-03 ENCOUNTER — Ambulatory Visit (INDEPENDENT_AMBULATORY_CARE_PROVIDER_SITE_OTHER): Payer: Medicaid Other | Admitting: Internal Medicine

## 2020-04-03 ENCOUNTER — Other Ambulatory Visit: Payer: Self-pay

## 2020-04-03 VITALS — BP 110/80 | HR 81 | Ht 65.0 in | Wt 249.0 lb

## 2020-04-03 DIAGNOSIS — I10 Essential (primary) hypertension: Secondary | ICD-10-CM

## 2020-04-03 DIAGNOSIS — R5383 Other fatigue: Secondary | ICD-10-CM

## 2020-04-03 DIAGNOSIS — I480 Paroxysmal atrial fibrillation: Secondary | ICD-10-CM | POA: Diagnosis not present

## 2020-04-03 DIAGNOSIS — Q2112 Patent foramen ovale: Secondary | ICD-10-CM

## 2020-04-03 DIAGNOSIS — E039 Hypothyroidism, unspecified: Secondary | ICD-10-CM

## 2020-04-03 DIAGNOSIS — Q211 Atrial septal defect: Secondary | ICD-10-CM

## 2020-04-03 DIAGNOSIS — E785 Hyperlipidemia, unspecified: Secondary | ICD-10-CM

## 2020-04-03 NOTE — Progress Notes (Signed)
Follow-up Outpatient Visit Date: 04/03/2020  Primary Care Provider: [name], FNP [address] [place] [address]  Chief Complaint: Fatigue  HPI:  Margaret [name] is a 49 y.o. female with history of paroxysmal atrial fibrillation complicated by stroke, patent foramen ovale, hypothyroidism, asthma, COVID-19 (05/2019), and remote polysubstance abuse, who presents for follow-up of atrial fibrillation and headache.  She was last seen in our office by Gillian Shields, NP, in late September for evaluation of headache.  She was advised to establish with her new PCP for further evaluation of her headache.  No additional testing or medication changes were pursued.  Today, Margaret [name] is most concerned about fatigue that began around a month ago.  She notes that she received her COVID-19 buster around that time.  She had similar symptoms after receiving her first two shots, though the fatigue did not seem to last as long.  She denies chest pain, shortness of breath, palpitations, lightheadedness, and edema.  On further questioning, Margaret [name] reports stopping levothyroxine 1-2 months ago; she cannot give a reason for discontinuing the medication.  She has otherwise been compliant with her medications.  --------------------------------------------------------------------------------------------------  Past Medical History:  Diagnosis Date  . Anxiety   . Arthritis   . Asthma    controlled  . CVA (cerebral vascular accident) (HCC)    a. 09/2016  . Depression   . Dysrhythmia   . Headache   . Hypertension   . Hypothyroidism   . Morbid obesity (HCC)   . PAF (paroxysmal atrial fibrillation) (HCC) 09/2016   a. 30-day event monitor 6/18: predominant rhythm of sinus with isolated PACs and PVCs.  Two narrow complex tachycardia episodes concerning for A. Fib +/-atrial flutter or SVT.  No prolonged pauses; b. CHADS2VASc = 3 (stroke x 2, female)--> Eliquis.  Marland Kitchen PFO (patent foramen ovale)    a. TTE 6/18: EF of  60 to 65%, normal wall motion, grade 1 diastolic dysfunction; b. TEE 6/18: evidence of a very small atrial level right to left shunt by bubble study without visualization of ASD or PFO  . Substance abuse (HCC)    Past use of cocaine   . Tobacco abuse    Past Surgical History:  Procedure Laterality Date  . FRACTURE SURGERY    . TEE WITHOUT CARDIOVERSION N/A 09/08/2016   Procedure: TRANSESOPHAGEAL ECHOCARDIOGRAM (TEE);  Surgeon: [name], MD;  Location: [facility] ORS;  Service: Cardiovascular;  Laterality: N/A;  . XI ROBOTIC ASSISTED VENTRAL HERNIA N/A 09/13/2019   Procedure: XI ROBOTIC ASSISTED EPIGATRIC HERNIA;  Surgeon: Campbell Lerner, MD;  Location: [facility] ORS;  Service: General;  Laterality: N/A;    Current Meds  Medication Sig  . apixaban (ELIQUIS) 5 MG TABS tablet Take 1 tablet (5 mg total) by mouth 2 (two) times daily.  Marland Kitchen atorvastatin (LIPITOR) 40 MG tablet Take 1 tablet (40 mg total) by mouth daily.  Marland Kitchen levothyroxine (SYNTHROID) 100 MCG tablet Take 1 tablet (100 mcg total) by mouth daily.  . metoprolol tartrate (LOPRESSOR) 25 MG tablet Take 1 tablet (25 mg total) by mouth 2 (two) times daily.    Allergies: Patient has no known allergies.  Social History   Tobacco Use  . Smoking status: Current Every Day Smoker    Packs/day: 0.25    Types: Cigarettes  . Smokeless tobacco: Former Neurosurgeon    Types: Snuff  . Tobacco comment: trying to quit; going to start using patches;   Vaping Use  . Vaping Use: Never used  Substance Use Topics  . Alcohol use: Not Currently    Alcohol/week: 0.0 standard drinks    Comment: on occasion  . Drug use: Not Currently    Types: Marijuana    Comment: Previously, but stopped years ago    Family History  Problem Relation Age of Onset  . Stroke Father   . Hypertension Daughter   . Migraines Daughter   . Cervical cancer Maternal Grandmother   . Heart disease Maternal Grandfather   . Stroke Maternal Grandfather     Review of Systems: A  12-system review of systems was performed and was negative except as noted in the HPI.  --------------------------------------------------------------------------------------------------  Physical Exam: BP 110/80 (BP Location: Left Arm, Patient Position: Sitting, Cuff Size: Normal)   Pulse 81   Ht 5\' 5"  (1.651 m)   Wt 249 lb (112.9 kg)   LMP 05/07/2016   SpO2 97%   BMI 41.44 kg/m   General:  NAD. Neck: No JVD or HJR, though body habitus limits evaluation. Lungs: CTA bilaterally. Heart: RRR w/o m/r/g. Abd: Obese and soft, NT/ND. Ext: No LE edema.  EKG:  NSR with nonspecific T wave abnormality.  No significant change from prior tracing on 08/24/2019.  Lab Results  Component Value Date   WBC 10.8 01/22/2020   HGB 12.2 01/22/2020   HCT 38.0 01/22/2020   MCV 85.2 01/22/2020   PLT 405 (H) 01/22/2020    Lab Results  Component Value Date   NA 140 01/22/2020   K 4.0 01/22/2020   CL 105 01/22/2020   CO2 28 01/22/2020   BUN 9 01/22/2020   CREATININE 0.78 01/22/2020   GLUCOSE 102 (H) 01/22/2020   ALT 16 01/22/2020    Lab Results  Component Value Date   CHOL 127 01/22/2020   HDL 38 (L) 01/22/2020   LDLCALC 67 01/22/2020   TRIG 134 01/22/2020   CHOLHDL 3.3 01/22/2020    --------------------------------------------------------------------------------------------------  ASSESSMENT AND PLAN: Fatigue and hypothyroidism: I suspect this is most likely due to self-discontinuation of levothyroxine 1-2 months ago.  We will check a TSH today with plans to restart levothyroxine at her prior dose if TSH is elevated.  We will also check a CMP and CBC.  I encouraged her to remain compliant with all of her medications.  If TSH is normal, we will need to consider checking an echo to ensure that she has not developed cardiomyopathy in the setting of COVID-19 booster, though symptoms and time course are not consistent with acute myocarditis that has been reported following COVID-19  vaccination.  Paroxysmal atrial fibrillation: No palpitations reported, with EKG today again showing NSR.  Continue current doses of metoprolol and apixaban.  PFO: Given chronic anticoagulation with history of PAF, no indication for PFO closure.  Hypertension: BP reasonable.  Continue metoprolol.  Hyperlipidemia: Continue atorvastatin for secondary prevention of CVA.  Follow-up: RTC in 6 weeks.  01/24/2020, MD 04/03/2020 2:43 PM

## 2020-04-03 NOTE — Patient Instructions (Signed)
Medication Instructions:   Your physician recommends that you continue on your current medications as directed. Please refer to the Current Medication list given to you today.   *If you need a refill on your cardiac medications before your next appointment, please call your pharmacy*   Lab Work: Your physician recommends that you have lab work TODAY: CBC, Cmet, TSH  If you have labs (blood work) drawn today and your tests are completely normal, you will receive your results only by:  MyChart Message (if you have MyChart) OR  A paper copy in the mail If you have any lab test that is abnormal or we need to change your treatment, we will call you to review the results.   Testing/Procedures: None ordered   Follow-Up: At [facility], you and your health needs are our priority.  As part of our continuing mission to provide you with exceptional heart care, we have created designated Provider Care Teams.  These Care Teams include your primary Cardiologist (physician) and Advanced Practice Providers (APPs -  Physician Assistants and Nurse Practitioners) who all work together to provide you with the care you need, when you need it.  We recommend signing up for the patient portal called "MyChart".  Sign up information is provided on this After Visit Summary.  MyChart is used to connect with patients for Virtual Visits (Telemedicine).  Patients are able to view lab/test results, encounter notes, upcoming appointments, etc.  Non-urgent messages can be sent to your provider as well.   To learn more about what you can do with MyChart, go to [email or web address].    Your next appointment:   6 week(s)  The format for your next appointment:   In Person  Provider:   You may see [name], MD or one of the following Advanced Practice Providers on your designated Care Team:    Nicolasa Ducking, NP  Eula Listen, PA-C  Marisue Ivan, PA-C  Cadence Heilwood, [place]  Gillian Shields,  NP

## 2020-04-04 ENCOUNTER — Telehealth: Payer: Self-pay

## 2020-04-04 LAB — COMPREHENSIVE METABOLIC PANEL
ALT: 21 IU/L (ref 0–32)
AST: 21 IU/L (ref 0–40)
Albumin/Globulin Ratio: 1.5 (ref 1.2–2.2)
Albumin: 4.1 g/dL (ref 3.8–4.8)
Alkaline Phosphatase: 119 IU/L (ref 44–121)
BUN/Creatinine Ratio: 7 — ABNORMAL LOW (ref 9–23)
BUN: 7 mg/dL (ref 6–24)
Bilirubin Total: 0.3 mg/dL (ref 0.0–1.2)
CO2: 27 mmol/L (ref 20–29)
Calcium: 9.5 mg/dL (ref 8.7–10.2)
Chloride: 103 mmol/L (ref 96–106)
Creatinine, Ser: 0.99 mg/dL (ref 0.57–1.00)
GFR calc Af Amer: 78 mL/min/{1.73_m2} (ref >59)
GFR calc non Af Amer: 68 mL/min/{1.73_m2} (ref >59)
Globulin, Total: 2.8 g/dL (ref 1.5–4.5)
Glucose: 98 mg/dL (ref 65–99)
Potassium: 4.1 mmol/L (ref 3.5–5.2)
Sodium: 143 mmol/L (ref 134–144)
Total Protein: 6.9 g/dL (ref 6.0–8.5)

## 2020-04-04 LAB — CBC
Hematocrit: 38.2 % (ref 34.0–46.6)
Hemoglobin: 12.5 g/dL (ref 11.1–15.9)
MCH: 27.4 pg (ref 26.6–33.0)
MCHC: 32.7 g/dL (ref 31.5–35.7)
MCV: 84 fL (ref 79–97)
Platelets: 391 10*3/uL (ref 150–450)
RBC: 4.56 x10E6/uL (ref 3.77–5.28)
RDW: 13.9 % (ref 11.7–15.4)
WBC: 11.5 10*3/uL — ABNORMAL HIGH (ref 3.4–10.8)

## 2020-04-04 LAB — TSH: TSH: 9.48 u[IU]/mL — ABNORMAL HIGH (ref 0.450–4.500)

## 2020-04-04 NOTE — Telephone Encounter (Signed)
Left message for pt to call back for lab results. MyChart not active. [phone]  Is not a working number, left message on [phone]

## 2020-04-04 NOTE — Telephone Encounter (Signed)
-----   Message from [name], MD sent at 04/04/2020  6:32 AM EST ----- Please let [name] know that her TSH is elevated, consistent with her having been off of Synthroid for the last 1 to 2 months.  This is likely why she has felt more fatigued recently.  I recommend that she restart her previously prescribed dose of levothyroxine.  Chronic mild leukocytosis persists.  Renal function and electrolytes are normal.  She should speak with her PCP regarding ongoing management of her hypothyroidism and further work-up of her leukocytosis.

## 2020-04-05 ENCOUNTER — Encounter: Payer: Self-pay | Admitting: Internal Medicine

## 2020-04-05 DIAGNOSIS — R5383 Other fatigue: Secondary | ICD-10-CM | POA: Insufficient documentation

## 2020-04-10 ENCOUNTER — Telehealth: Payer: Self-pay

## 2020-04-10 NOTE — Telephone Encounter (Signed)
Referral coordinator contacted and requested to change referral to [facility]

## 2020-04-10 NOTE — Telephone Encounter (Signed)
Copied from CRM [id number]. Topic: General - Inquiry >> Apr 10, 2020  9:59 AM Adrian Prince D wrote: Reason for CRM: Patient would like to switch her dermatologist referral to another location. The name of the location is [facility]. The new location is [address]. And the phone number is [phone]. Please advise

## 2020-04-11 NOTE — Telephone Encounter (Signed)
Pt is calling checking on the referral to new dermatologist

## 2020-04-11 NOTE — Telephone Encounter (Signed)
Pt called back and was given the message.  Pt verbalized understanding.

## 2020-04-11 NOTE — Telephone Encounter (Signed)
Left a message on the patient vm that referral was submitted to referral coordinator.

## 2020-04-12 ENCOUNTER — Telehealth: Payer: Self-pay

## 2020-04-12 NOTE — Telephone Encounter (Signed)
Referral faxed over to [facility].

## 2020-04-12 NOTE — Telephone Encounter (Signed)
Copied from CRM [id number]. Topic: General - Other >> Apr 12, 2020  2:45 PM Dalphine Handing A wrote: Patient stated that [facility] dermat[facility]  is requesting the referral be sent over on their paper.

## 2020-04-18 ENCOUNTER — Telehealth: Payer: Self-pay

## 2020-04-18 NOTE — Telephone Encounter (Signed)
[facility] called and group NPI given to [facility].

## 2020-04-18 NOTE — Telephone Encounter (Signed)
Copied from CRM [id number]. Topic: General - Other >> Apr 18, 2020  9:33 AM Jaquita Rector A wrote: Reason for CRM: Irving Burton with [facility] called in to say that she received a referral that has the referring providers NPI number but they actually need the group NPI number instead. Please call Irving Burton at  Ph# [phone] Ext [phone]

## 2020-04-23 ENCOUNTER — Telehealth: Payer: Self-pay

## 2020-04-23 NOTE — Telephone Encounter (Signed)
Copied from CRM [id number]. Topic: Referral - Status >> Apr 23, 2020  9:44 AM Marylen Ponto wrote: Reason for CRM: Pt stated she is getting the run around from [facility]. Pt stated she prefers to go to [facility] or [facility] in [place].

## 2020-04-23 NOTE — Telephone Encounter (Signed)
Update sent to referral coordinator

## 2020-05-01 ENCOUNTER — Telehealth: Payer: Medicaid Other | Admitting: Family Medicine

## 2020-05-01 ENCOUNTER — Other Ambulatory Visit: Payer: Self-pay

## 2020-05-03 ENCOUNTER — Telehealth: Payer: Medicaid Other | Admitting: Family Medicine

## 2020-05-08 ENCOUNTER — Telehealth: Payer: Self-pay | Admitting: Internal Medicine

## 2020-05-08 ENCOUNTER — Other Ambulatory Visit: Payer: Self-pay

## 2020-05-08 MED ORDER — METOPROLOL TARTRATE 25 MG PO TABS: 25.0000 mg | ORAL_TABLET | Freq: Two times a day (BID) | ORAL | 1 refills | Status: DC

## 2020-05-08 NOTE — Telephone Encounter (Signed)
Requested Prescriptions   Signed Prescriptions Disp Refills   metoprolol tartrate (LOPRESSOR) 25 MG tablet 180 tablet 1    Sig: Take 1 tablet (25 mg total) by mouth 2 (two) times daily.    Authorizing Provider: Creig Hines    Ordering User: Margrett Rud

## 2020-05-08 NOTE — Telephone Encounter (Signed)
Requested Prescriptions   Signed Prescriptions Disp Refills   metoprolol tartrate (LOPRESSOR) 25 MG tablet 180 tablet 1    Sig: Take 1 tablet (25 mg total) by mouth 2 (two) times daily.    Authorizing Provider: BERGE, CHRISTOPHER RONALD    Ordering User: Daleah Coulson N   

## 2020-05-08 NOTE — Telephone Encounter (Signed)
*  STAT* If patient is at the pharmacy, call can be transferred to refill team.   1. Which medications need to be refilled? (please list name of each medication and dose if known)    Metoprolol 25 mg po BID    2. Which pharmacy/location (including street and city if local pharmacy) is medication to be sent to?  [facility] [address]    3. Do they need a 30 day or 90 day supply? 90

## 2020-05-14 ENCOUNTER — Encounter: Payer: Self-pay | Admitting: Podiatry

## 2020-05-14 ENCOUNTER — Ambulatory Visit: Payer: Medicaid Other | Admitting: Podiatry

## 2020-05-14 ENCOUNTER — Ambulatory Visit (INDEPENDENT_AMBULATORY_CARE_PROVIDER_SITE_OTHER): Payer: Medicaid Other | Admitting: Podiatry

## 2020-05-14 ENCOUNTER — Telehealth: Payer: Self-pay | Admitting: Internal Medicine

## 2020-05-14 ENCOUNTER — Other Ambulatory Visit: Payer: Self-pay

## 2020-05-14 DIAGNOSIS — B351 Tinea unguium: Secondary | ICD-10-CM | POA: Diagnosis not present

## 2020-05-14 DIAGNOSIS — L603 Nail dystrophy: Secondary | ICD-10-CM

## 2020-05-14 NOTE — Telephone Encounter (Signed)
Patient calling to check on status of scheduling / ordering mri mra  For headaches .  Patient would like this to be ordered and scheduled.  Please call.

## 2020-05-14 NOTE — Telephone Encounter (Signed)
Attempted to call pt. No answer. Lmtcb.  

## 2020-05-15 NOTE — Telephone Encounter (Signed)
Spoke to pt. She asks when her MRI will be scheduled for headaches. Last office visit 04/03/20 w/ Dr. Okey Dupre.  Notified pt I do not have indication for MRI to be ordered in chart. Reviewed chart, and do not see MRI for plan.  Pt states she thought this was discussed regarding r/o cause for headaches at cardiology visit. Pt denies that this was discussed by her PCP. She sates she would like to discuss scheduling MRI / MRA at next RTC that is scheduled 06/05/20.  Pt states she has no further needs at this time. Will forward to Dr. Okey Dupre to make aware that pt is interested in discussing this at follow up visit.

## 2020-05-15 NOTE — Telephone Encounter (Signed)
Patient should reach out to her PCP to discuss MRI.  It would not be in my scope of practice to order this test for further evaluation of HA.  I see that neurology referral was placed by her PCP last fall.  It is possible that a neurologist may have ordered this test.  [name], MD [facility]

## 2020-05-15 NOTE — Telephone Encounter (Signed)
Patient returning call about scheduling mri.  When notified nurse was in a room and would have to return her call patient disconnected call.

## 2020-05-16 ENCOUNTER — Encounter: Payer: Self-pay | Admitting: Podiatry

## 2020-05-16 NOTE — Progress Notes (Signed)
Subjective:  Patient ID: Margaret [name], female    DOB: 03/09/1971,  MRN: [id number]  Chief Complaint  Patient presents with  . Nail Problem    Nail trim RFC    50 y.o. female presents with the above complaint.  Patient presents with a new complaint of thickened elongated dystrophic bilateral hallux.  Patient states is painful to touch.  She would like to think about options to help treat the nail fungus.  She states that she has not had the nail removed.  She would like to discuss treatment options for it.  She denies any other acute complaints.   Review of Systems: Negative except as noted in the HPI. Denies N/V/F/Ch.  Past Medical History:  Diagnosis Date  . Anxiety   . Arthritis   . Asthma    controlled  . CVA (cerebral vascular accident) (HCC)    a. 09/2016  . Depression   . Dysrhythmia   . Headache   . Hypertension   . Hypothyroidism   . Morbid obesity (HCC)   . PAF (paroxysmal atrial fibrillation) (HCC) 09/2016   a. 30-day event monitor 6/18: predominant rhythm of sinus with isolated PACs and PVCs.  Two narrow complex tachycardia episodes concerning for A. Fib +/-atrial flutter or SVT.  No prolonged pauses; b. CHADS2VASc = 3 (stroke x 2, female)--> Eliquis.  Marland Kitchen PFO (patent foramen ovale)    a. TTE 6/18: EF of 60 to 65%, normal wall motion, grade 1 diastolic dysfunction; b. TEE 6/18: evidence of a very small atrial level right to left shunt by bubble study without visualization of ASD or PFO  . Substance abuse (HCC)    Past use of cocaine   . Tobacco abuse     Current Outpatient Medications:  .  apixaban (ELIQUIS) 5 MG TABS tablet, Take 1 tablet (5 mg total) by mouth 2 (two) times daily., Disp: 180 tablet, Rfl: 1 .  atorvastatin (LIPITOR) 80 MG tablet, Take 80 mg by mouth at bedtime., Disp: , Rfl:  .  levothyroxine (SYNTHROID) 100 MCG tablet, Take 1 tablet (100 mcg total) by mouth daily., Disp: 30 tablet, Rfl: 0 .  levothyroxine (SYNTHROID) 125 MCG tablet, Take 1 tablet  by mouth once a day  1 by mouth daily for hypothyroid, Disp: , Rfl:  .  metoprolol tartrate (LOPRESSOR) 25 MG tablet, Take 1 tablet (25 mg total) by mouth 2 (two) times daily., Disp: 180 tablet, Rfl: 1  Social History   Tobacco Use  Smoking Status Current Every Day Smoker  . Packs/day: 0.25  . Types: Cigarettes  Smokeless Tobacco Former Neurosurgeon  . Types: Snuff  Tobacco Comment   trying to quit; going to start using patches;     No Known Allergies Objective:  There were no vitals filed for this visit. There is no height or weight on file to calculate BMI. Constitutional Well developed. Well nourished.  Vascular Dorsalis pedis pulses palpable bilaterally. Posterior tibial pulses palpable bilaterally. Capillary refill normal to all digits.  No cyanosis or clubbing noted. Pedal hair growth normal.  Neurologic Normal speech. Oriented to person, place, and time. Epicritic sensation to light touch grossly present bilaterally.  Dermatologic  thickened elongated dystrophic mycotic nail noted to bilateral hallux.  Mild pain on palpation  Orthopedic: Normal joint ROM without pain or crepitus bilaterally. No visible deformities. No bony tenderness.   Radiographs: None Assessment:   1. Onychodystrophy   2. Nail fungus    Plan:  Patient was evaluated and treated and  all questions answered.  Bilateral hallux onychomycosis -I explained the patient the etiology of onychomycosis and various treatment options were extensively discussed.  Patient does not want to try any kind of medication to help decrease the growth of the fungus.  I did discuss with the patient that she could benefit from a nail removal if it continues to get worse or painful.  She would like to discuss think about that option and will get back to me when she is ready to have the nail removed.  She denies any other acute complaints.  No follow-ups on file.

## 2020-05-16 NOTE — Telephone Encounter (Signed)
Attempted to call pt back. No answer. Lmtcb. Will notify of provider's recc upon return call.

## 2020-05-21 ENCOUNTER — Ambulatory Visit: Payer: Medicaid Other | Admitting: Podiatry

## 2020-05-22 ENCOUNTER — Emergency Department
Admission: EM | Admit: 2020-05-22 | Discharge: 2020-05-22 | Disposition: A | Discharge status: Home / Self Care | Payer: Medicaid Other | Attending: Internal Medicine | Admitting: Internal Medicine

## 2020-05-22 ENCOUNTER — Other Ambulatory Visit: Payer: Self-pay

## 2020-05-22 DIAGNOSIS — E039 Hypothyroidism, unspecified: Secondary | ICD-10-CM | POA: Insufficient documentation

## 2020-05-22 DIAGNOSIS — F17210 Nicotine dependence, cigarettes, uncomplicated: Secondary | ICD-10-CM | POA: Diagnosis not present

## 2020-05-22 DIAGNOSIS — Z7901 Long term (current) use of anticoagulants: Secondary | ICD-10-CM | POA: Diagnosis not present

## 2020-05-22 DIAGNOSIS — R0602 Shortness of breath: Secondary | ICD-10-CM | POA: Diagnosis present

## 2020-05-22 DIAGNOSIS — I10 Essential (primary) hypertension: Secondary | ICD-10-CM | POA: Insufficient documentation

## 2020-05-22 DIAGNOSIS — Z79899 Other long term (current) drug therapy: Secondary | ICD-10-CM | POA: Insufficient documentation

## 2020-05-22 DIAGNOSIS — J45909 Unspecified asthma, uncomplicated: Secondary | ICD-10-CM | POA: Insufficient documentation

## 2020-05-22 DIAGNOSIS — R059 Cough, unspecified: Secondary | ICD-10-CM

## 2020-05-22 DIAGNOSIS — R5383 Other fatigue: Secondary | ICD-10-CM

## 2020-05-22 DIAGNOSIS — U071 COVID-19: Secondary | ICD-10-CM | POA: Diagnosis not present

## 2020-05-22 DIAGNOSIS — R519 Headache, unspecified: Secondary | ICD-10-CM

## 2020-05-22 DIAGNOSIS — R0989 Other specified symptoms and signs involving the circulatory and respiratory systems: Secondary | ICD-10-CM

## 2020-05-22 DIAGNOSIS — R197 Diarrhea, unspecified: Secondary | ICD-10-CM

## 2020-05-22 LAB — RESP PANEL BY RT-PCR (FLU A&B, COVID) ARPGX2
Influenza A by PCR: NEGATIVE
Influenza B by PCR: NEGATIVE
SARS Coronavirus 2 by RT PCR: POSITIVE — AB

## 2020-05-22 MED ORDER — KETOROLAC TROMETHAMINE 30 MG/ML IJ SOLN: 30.0000 mg | Freq: Once | INTRAMUSCULAR | Status: DC

## 2020-05-22 MED ORDER — KETOROLAC TROMETHAMINE 30 MG/ML IJ SOLN
INTRAMUSCULAR | Status: AC
  Filled 2020-05-22: qty 1

## 2020-05-22 NOTE — ED Notes (Signed)
See triage note  Presents with h/a ,body aches and sore throat  States she developed sx's about 3-4 days ago  Denies any fever  Has had min relief with OTC meds

## 2020-05-22 NOTE — ED Provider Notes (Signed)
[facility] Emergency Department Provider Note ____________________________________________  Time seen: 1000  I have reviewed the triage vital signs and the nursing notes.  HISTORY  Chief Complaint  Nasal Congestion, Cough, and Headache   HPI Margaret [name] is a 50 y.o. female presents to the ER today with c/ fatigue, weakness, headache, lightheadedness, blurry vision, runny nose, cough, SOB and diarrhea. She reports this started 1 week ago. The headache is located at the top of her nose/forehead region. She describes the pain as sharp and grinding. The pain radiates into the back of her head and into her neck. She denies pain, numbness or tingling in her upper extremities. She denies prior neck injury. She reports mild lightheadedness but no dizziness. She reports she has had blurry vision for the last 2 months- has not seen an eye doctor about this. She is blowing clear mucous out of her nose. The cough is nonproductive. The shortness of breath is mainly with coughing fits. She denies nausea, vomiting and the diarrhea has resolved. She has tried Midol and Tylenol PM with some relief of symptoms. She has had Covid in the past, but never had symptoms. She denies recent Covid exposure and has had 3 Covid vaccines.  Past Medical History:  Diagnosis Date  . Anxiety   . Arthritis   . Asthma    controlled  . CVA (cerebral vascular accident) (HCC)    a. 09/2016  . Depression   . Dysrhythmia   . Headache   . Hypertension   . Hypothyroidism   . Morbid obesity (HCC)   . PAF (paroxysmal atrial fibrillation) (HCC) 09/2016   a. 30-day event monitor 6/18: predominant rhythm of sinus with isolated PACs and PVCs.  Two narrow complex tachycardia episodes concerning for A. Fib +/-atrial flutter or SVT.  No prolonged pauses; b. CHADS2VASc = 3 (stroke x 2, female)--> Eliquis.  Marland Kitchen PFO (patent foramen ovale)    a. TTE 6/18: EF of 60 to 65%, normal wall motion, grade 1 diastolic  dysfunction; b. TEE 1/61: evidence of a very small atrial level right to left shunt by bubble study without visualization of ASD or PFO  . Substance abuse (HCC)    Past use of cocaine   . Tobacco abuse     Patient Active Problem List   Diagnosis Date Noted  . Fatigue 04/05/2020  . Encounter to establish care with new doctor 01/22/2020  . Encounter for hepatitis C screening test for low risk patient 01/22/2020  . Screening examination for sexually transmitted disease 01/22/2020  . Skin tag 01/22/2020  . Nonintractable headache 01/22/2020  . Status post laparoscopic cholecystectomy 09/28/2019  . Dyspnea on exertion 08/15/2018  . Multinodular goiter 04/28/2017  . Paroxysmal atrial fibrillation (HCC) 10/29/2016  . PFO (patent foramen ovale) 09/23/2016  . Hyperlipidemia 09/17/2016  . IFG (impaired fasting glucose) 09/17/2016  . History of stroke 09/06/2016  . Myopia 12/20/2014  . Abdominal pain 12/20/2014  . Depression 12/20/2014  . Right arm pain 11/21/2014  . 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 Thyroid activity decreased 10/18/2014  . Abnormal CBC 10/18/2014  . Tobacco abuse 10/18/2014  . Previous known suicide attempt 04/20/2014    Past Surgical History:  Procedure Laterality Date  . FRACTURE SURGERY    . TEE WITHOUT CARDIOVERSION N/A 09/08/2016   Procedure: TRANSESOPHAGEAL ECHOCARDIOGRAM (TEE);  Surgeon: [name], MD;  Location: [facility] ORS;  Service: Cardiovascular;  Laterality: N/A;  . XI ROBOTIC ASSISTED VENTRAL HERNIA  N/A 09/13/2019   Procedure: XI ROBOTIC ASSISTED EPIGATRIC HERNIA;  Surgeon: Campbell Lerner, MD;  Location: [facility] ORS;  Service: General;  Laterality: N/A;    Prior to Admission medications   Medication Sig Start Date End Date Taking? Authorizing Provider  apixaban (ELIQUIS) 5 MG TABS tablet Take 1 tablet (5 mg total) by mouth 2 (two) times daily. 12/12/19 06/09/20  End, Cristal Deer, MD  atorvastatin (LIPITOR) 80  MG tablet Take 80 mg by mouth at bedtime. 05/08/20   [provider]  levothyroxine (SYNTHROID) 100 MCG tablet Take 1 tablet (100 mcg total) by mouth daily. 03/28/20   Malfi, Jodelle Gross, FNP  levothyroxine (SYNTHROID) 125 MCG tablet Take 1 tablet by mouth once a day  1 by mouth daily for hypothyroid 05/08/20   [provider]  metoprolol tartrate (LOPRESSOR) 25 MG tablet Take 1 tablet (25 mg total) by mouth 2 (two) times daily. 05/08/20 05/08/21  Creig Hines, NP    Allergies Patient has no known allergies.  Family History  Problem Relation Age of Onset  . Stroke Father   . Hypertension Daughter   . Migraines Daughter   . Cervical cancer Maternal Grandmother   . Heart disease Maternal Grandfather   . Stroke Maternal Grandfather     Social History Social History   Tobacco Use  . Smoking status: Current Every Day Smoker    Packs/day: 0.25    Types: Cigarettes  . Smokeless tobacco: Former Neurosurgeon    Types: Snuff  . Tobacco comment: trying to quit; going to start using patches;   Vaping Use  . Vaping Use: Never used  Substance Use Topics  . Alcohol use: Not Currently    Alcohol/week: 0.0 standard drinks    Comment: on occasion  . Drug use: Not Currently    Types: Marijuana    Comment: Previously, but stopped years ago    Review of Systems  Constitutional: Positive for fatigue.  Negative for fever, chills or body aches. Eyes: Positive for blurred vision.  Negative for eye pain, redness or discharge. ENT: Positive for runny nose.  Negative for nasal congestion, ear pain, loss of taste/smell, sore throat. Cardiovascular: Negative for chest pain or chest tightness. Respiratory: Positive for cough and shortness of breath. Gastrointestinal: Positive for diarrhea-resolved.  Negative for abdominal pain, nausea vomiting and or blood in her stool. Genitourinary: Negative for urinary urgency, frequency, dysuria or blood in her urine. Musculoskeletal: Negative for  joint pain or swelling. Skin: Negative for rash. Neurological: Positive for headache and weakness.  Negative for tingling or numbness. ____________________________________________  PHYSICAL EXAM:  VITAL SIGNS: ED Triage Vitals  Enc Vitals Group     BP 05/22/20 0938 (!) 128/97     Pulse Rate 05/22/20 0938 70     Resp 05/22/20 0938 17     Temp 05/22/20 0938 98.6 F (37 C)     Temp Source 05/22/20 0938 Oral     SpO2 --      Weight 05/22/20 0950 246 lb 14.6 oz (112 kg)     Height 05/22/20 0950 5\' 5"  (1.651 m)     Head Circumference --      Peak Flow --      Pain Score 05/22/20 0933 6     Pain Loc --      Pain Edu? --      Excl. in GC? --     Constitutional: Alert and oriented.  Obese, in no distress. Head: Normocephalic and atraumatic. Eyes: Conjunctivae are normal.  PERRL. Normal extraocular movements Ears: Canals clear. TMs intact bilaterally. Nose: No congestion/rhinorrhea/epistaxis. Mouth/Throat: Mucous membranes are moist.  No posterior pharynx erythema or exudate noted. Neck: Thyromegaly noted. Hematological/Lymphatic/Immunological: No cervical lymphadenopathy. Cardiovascular: Normal rate, regular rhythm.  Respiratory: Normal respiratory effort. No wheezes/rales/rhonchi. Gastrointestinal: Soft and nontender. No distention. Musculoskeletal: Gait slow and steady without device. Neurologic: Normal speech and language. No gross focal neurologic deficits are appreciated. Skin: Eczema noted anterior neck. Psychiatric: Mood and affect are normal. Patient exhibits appropriate insight and judgment. ____________________________________________    LABS  Labs Reviewed  RESP PANEL BY RT-PCR (FLU A&B, COVID) ARPGX2 - Abnormal; Notable for the following components:      Result Value   SARS Coronavirus 2 by RT PCR POSITIVE (*)    All other components within normal limits    ____________________________________________  INITIAL IMPRESSION / ASSESSMENT AND PLAN / ED  COURSE  Fatigue, Acute Headache, Runny Nose, Cough , SOB., Diarrhea:  DDX include influenza, covid, vs viral illness Flu swab negative Covid swab popsitive Toradol 30 mg IM for headache Acute Headache, Abdominal Pain, Vomiting and Fever: Encouraged rest and fluids Can take Tylenol or Ibuprofen OTC as needed for symptom management Can take Delsym OTC for cough Discussed masking, social distancing, frequent handwashing and self quarantine until 24 hours symptom free without medications Work note provided ____________________________________________  FINAL CLINICAL IMPRESSION(S) / ED DIAGNOSES  Final diagnoses:  COVID-19  Other fatigue  Acute intractable headache, unspecified headache type  Runny nose  Cough  SOB (shortness of breath)  Diarrhea, unspecified type      Lorre Munroe, NP 05/22/20 1225    Jene Every, MD 05/25/20 [phone]

## 2020-05-22 NOTE — ED Triage Notes (Signed)
Pt comes via POV from home with c/o cough, headache and congestion. Pt states this started over the weekend.  Pt states dry cough.

## 2020-05-22 NOTE — Discharge Instructions (Signed)
You were seen today for URI symptoms. Your Covid test is positive. We gave you an injection for your headache today. You can take Tylenol or ibuprofen over-the-counter as needed for headache and body aches. We recommend Delsym over-the-counter for your cough. You should get rest and drink plenty of fluids. Please follow-up with your PCP if symptoms persist, back to the ER if symptoms worsen. We have provided you with a work note.

## 2020-05-23 ENCOUNTER — Other Ambulatory Visit: Payer: Self-pay | Admitting: Physician Assistant

## 2020-05-23 ENCOUNTER — Telehealth: Payer: Self-pay | Admitting: Physician Assistant

## 2020-05-23 ENCOUNTER — Telehealth: Payer: Self-pay

## 2020-05-23 DIAGNOSIS — Z720 Tobacco use: Secondary | ICD-10-CM

## 2020-05-23 DIAGNOSIS — Z8673 Personal history of transient ischemic attack (TIA), and cerebral infarction without residual deficits: Secondary | ICD-10-CM

## 2020-05-23 DIAGNOSIS — I480 Paroxysmal atrial fibrillation: Secondary | ICD-10-CM

## 2020-05-23 DIAGNOSIS — U071 COVID-19: Secondary | ICD-10-CM

## 2020-05-23 DIAGNOSIS — R7301 Impaired fasting glucose: Secondary | ICD-10-CM

## 2020-05-23 NOTE — Telephone Encounter (Signed)
Transition Care Management Unsuccessful Follow-up Telephone Call  Date of discharge and from where:  05/22/2020 from [facility]  Attempts:  1st Attempt  Reason for unsuccessful TCM follow-up call:  Left voice message

## 2020-05-23 NOTE — Progress Notes (Addendum)
I connected by phone with Margaret [name] on 05/23/2020 at 10:04 AM to discuss the potential use of a new treatment for mild to moderate COVID-19 viral infection in non-hospitalized patients.  This patient is a 50 y.o. female that meets the FDA criteria for Emergency Use Authorization of COVID monoclonal antibody sotrovimab  Has a (+) direct SARS-CoV-2 viral test result  Has mild or moderate COVID-19   Is NOT hospitalized due to COVID-19  Is within 10 days of symptom onset  Has at least one of the high risk factor(s) for progression to severe COVID-19 and/or hospitalization as defined in EUA.  Specific high risk criteria : BMI > 25, Cardiovascular disease or hypertension, Chronic Lung Disease and Other high risk medical condition per CDC:  high SVI   I have spoken and communicated the following to the patient or parent/caregiver regarding COVID monoclonal antibody treatment:  1. FDA has authorized the emergency use for the treatment of mild to moderate COVID-19 in adults and pediatric patients with positive results of direct SARS-CoV-2 viral testing who are 68 years of age and older weighing at least 40 kg, and who are at high risk for progressing to severe COVID-19 and/or hospitalization.  2. The significant known and potential risks and benefits of COVID monoclonal antibody, and the extent to which such potential risks and benefits are unknown.  3. Information on available alternative treatments and the risks and benefits of those alternatives, including clinical trials.  4. Patients treated with COVID monoclonal antibody should continue to self-isolate and use infection control measures (e.g., wear mask, isolate, social distance, avoid sharing personal items, clean and disinfect "high touch" surfaces, and frequent handwashing) according to CDC guidelines.   5. The patient or parent/caregiver has the option to accept or refuse COVID monoclonal antibody treatment.  After reviewing this  information with the patient, the patient has agreed to receive one of the available covid 19 monoclonal antibodies and will be provided an appropriate fact sheet prior to infusion.  Sx onset 2/13. Set up for Sotrovimab infusion on 2/19 @ 11:30am (due to transportation issues). Directions given to [facility]. Pt is aware that insurance will be charged an infusion fee. Pt is fully vaccinated.   [name] 05/23/2020 10:04 AM

## 2020-05-23 NOTE — Telephone Encounter (Signed)
Called to discuss with patient about COVID-19 symptoms and the use of one of the available treatments for those with mild to moderate Covid symptoms and at a high risk of hospitalization.  Pt appears to qualify for outpatient treatment due to co-morbid conditions and/or a member of an at-risk group in accordance with the FDA Emergency Use Authorization.    Symptom onset: ~ 2/9 per ER notes Vaccinated: yes Booster? yes Immunocompromised? no Qualifiers: BMI, high SVI, CVD (afib, CVA)  Unable to reach pt - left VM with hotline #  [name]

## 2020-05-24 ENCOUNTER — Ambulatory Visit (HOSPITAL_COMMUNITY)
Admission: RE | Admit: 2020-05-24 | Discharge: 2020-05-24 | Disposition: A | Discharge status: Home / Self Care | Payer: Medicaid Other | Source: Ambulatory Visit | Attending: Pulmonary Disease | Admitting: Pulmonary Disease

## 2020-05-24 DIAGNOSIS — Z720 Tobacco use: Secondary | ICD-10-CM | POA: Diagnosis not present

## 2020-05-24 DIAGNOSIS — U071 COVID-19: Secondary | ICD-10-CM | POA: Diagnosis present

## 2020-05-24 DIAGNOSIS — Z8673 Personal history of transient ischemic attack (TIA), and cerebral infarction without residual deficits: Secondary | ICD-10-CM | POA: Diagnosis not present

## 2020-05-24 DIAGNOSIS — R7301 Impaired fasting glucose: Secondary | ICD-10-CM | POA: Insufficient documentation

## 2020-05-24 DIAGNOSIS — I480 Paroxysmal atrial fibrillation: Secondary | ICD-10-CM | POA: Insufficient documentation

## 2020-05-24 MED ORDER — SOTROVIMAB 500 MG/8ML IV SOLN
500.0000 mg | Freq: Once | INTRAVENOUS | Status: AC
  Administered 2020-05-24: 500 mg via INTRAVENOUS

## 2020-05-24 MED ORDER — ALBUTEROL SULFATE HFA 108 (90 BASE) MCG/ACT IN AERS: 2.0000 | INHALATION_SPRAY | Freq: Once | RESPIRATORY_TRACT | Status: DC | PRN

## 2020-05-24 MED ORDER — METHYLPREDNISOLONE SODIUM SUCC 125 MG IJ SOLR: 125.0000 mg | Freq: Once | INTRAMUSCULAR | Status: DC | PRN

## 2020-05-24 MED ORDER — FAMOTIDINE IN NACL 20-0.9 MG/50ML-% IV SOLN: 20.0000 mg | Freq: Once | INTRAVENOUS | Status: DC | PRN

## 2020-05-24 MED ORDER — EPINEPHRINE 0.3 MG/0.3ML IJ SOAJ: 0.3000 mg | Freq: Once | INTRAMUSCULAR | Status: DC | PRN

## 2020-05-24 MED ORDER — DIPHENHYDRAMINE HCL 50 MG/ML IJ SOLN: 50.0000 mg | Freq: Once | INTRAMUSCULAR | Status: DC | PRN

## 2020-05-24 MED ORDER — SODIUM CHLORIDE 0.9 % IV SOLN: INTRAVENOUS | Status: DC | PRN

## 2020-05-24 NOTE — Progress Notes (Signed)
Diagnosis: COVID-19  Physician: Dr. Patrick Wright  Procedure: Covid Infusion Clinic Med: Sotrovimab infusion - Provided patient with sotrovimab fact sheet for patients, parents, and caregivers prior to infusion.   Complications: No immediate complications noted  Discharge: Discharged home    

## 2020-05-24 NOTE — Discharge Instructions (Signed)

## 2020-05-24 NOTE — Telephone Encounter (Signed)
Transition Care Management Unsuccessful Follow-up Telephone Call  Date of discharge and from where:  02//16/2022 from [facility]  Attempts:  2nd Attempt  Reason for unsuccessful TCM follow-up call:  Voice mail full

## 2020-05-24 NOTE — Progress Notes (Signed)
Patient reviewed Fact Sheet for Patients, Parents, and Caregivers for Emergency Use Authorization (EUA) of sotrovimab for the Treatment of Coronavirus. Patient also reviewed and is agreeable to the estimated cost of treatment. Patient is agreeable to proceed.   

## 2020-05-24 NOTE — Telephone Encounter (Signed)
Pt called back.   Transition Care Management Follow-up Telephone Call  Date of discharge and from where: 05/22/2020 from [facility]  How have you been since you were released from the hospital? Pt stated that she is still feeling fatigue but she does not have a fever anymore.   Any questions or concerns? No  Items Reviewed:  Did the pt receive and understand the discharge instructions provided? Yes   Medications obtained and verified? Yes   Other? No   Any new allergies since your discharge? No   Dietary orders reviewed? n/a  Do you have support at home? Yes   Functional Questionnaire: (I = Independent and D = Dependent) ADLs: I  Bathing/Dressing- I  Meal Prep- I  Eating- I  Maintaining continence- I  Transferring/Ambulation- I  Managing Meds- I  Follow up appointments reviewed:   [facility]t confirmed? Yes  Scheduled to see [facility] on 05/24/2020 @ 2:30pm.  Are transportation arrangements needed? No   If their condition worsens, is the pt aware to call PCP or go to the Emergency Dept.? Yes  Was the patient provided with contact information for the PCP's office or ED? Yes  Was to pt encouraged to call back with questions or concerns? Yes

## 2020-06-05 ENCOUNTER — Ambulatory Visit: Payer: Medicaid Other | Admitting: Internal Medicine

## 2020-06-10 IMAGING — CR DG LUMBAR SPINE 2-3V
1 series · 3 of 3 positions shown · non-contrast
Comparison: August 06, 2007

CLINICAL DATA: Pain following recent fall

EXAM:
LUMBAR SPINE - 2-3 VIEW

[Series 1: dg lumbar spine 2-3 views · 0.14mm/px · 3 of 3 slices shown]
[im 1/3]
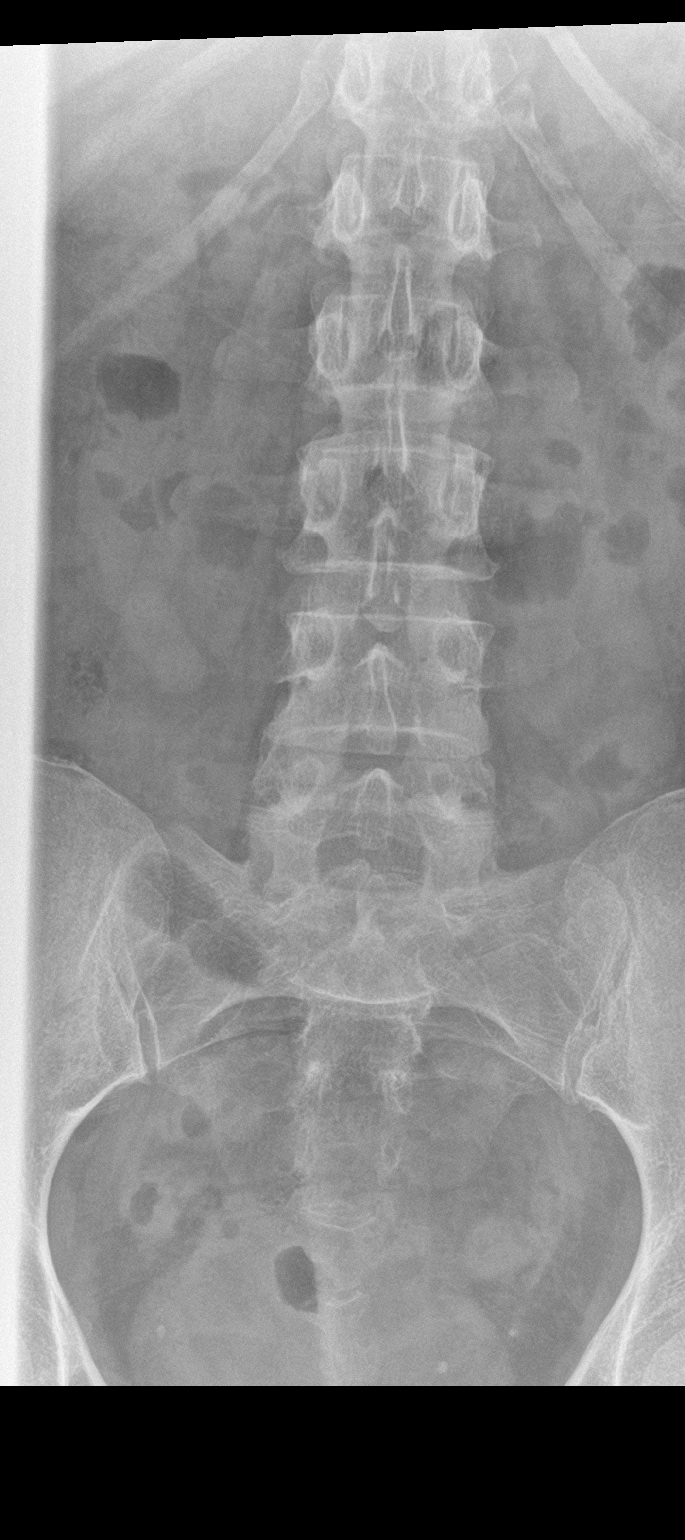
[im 2/3]
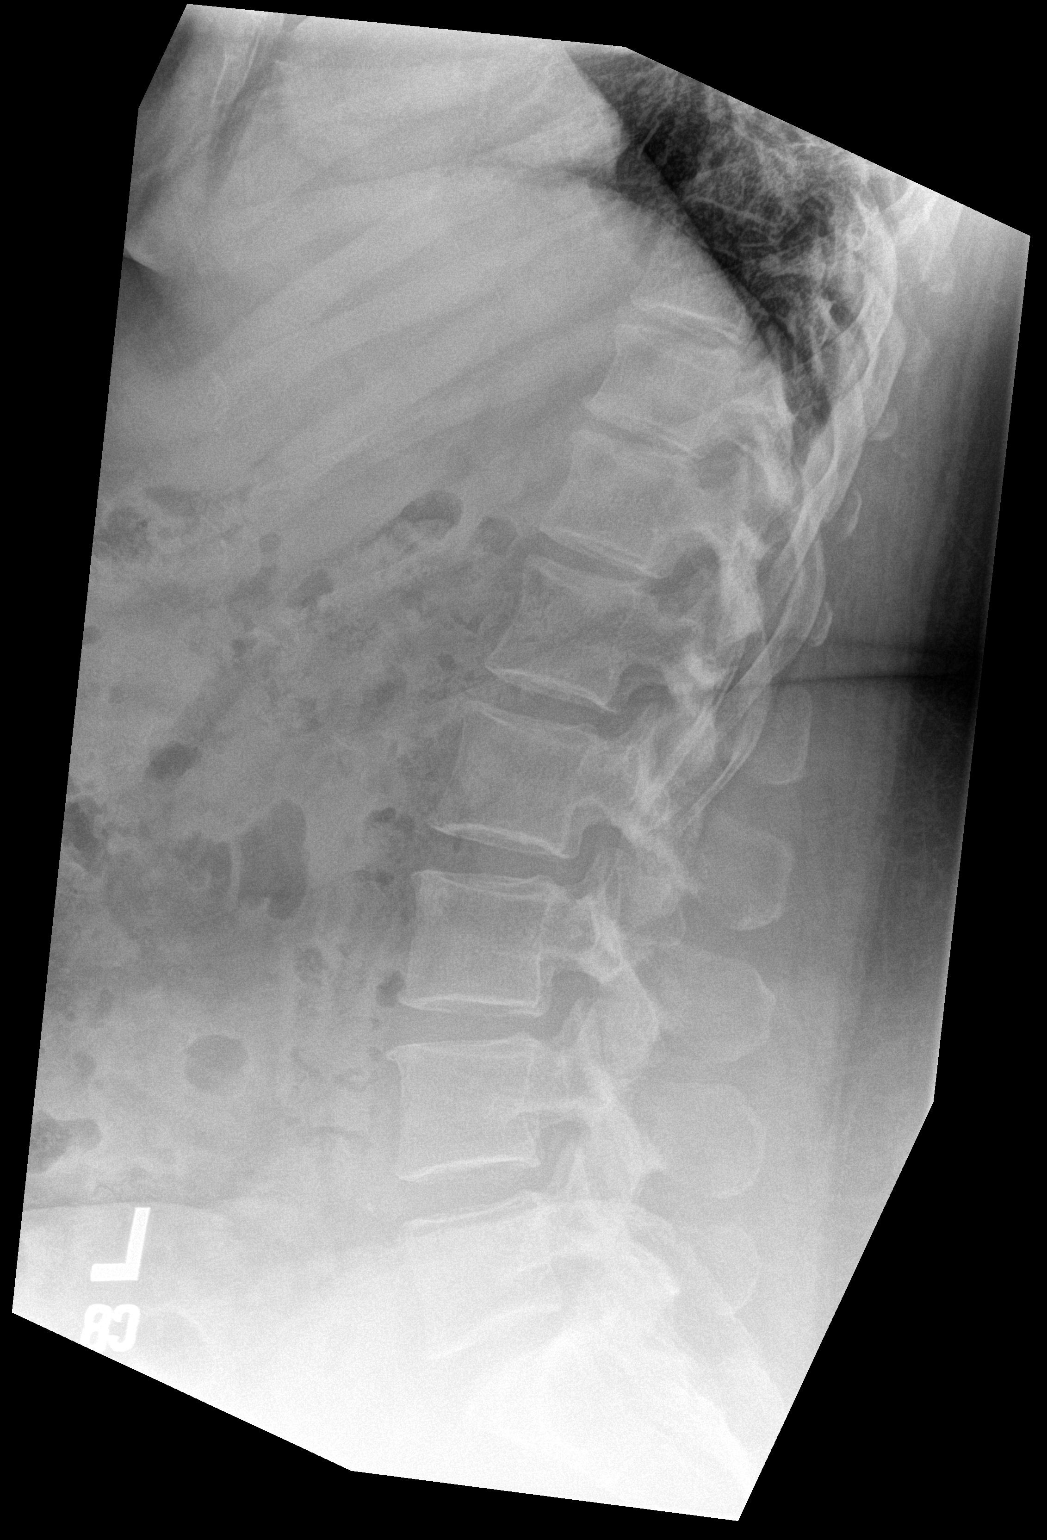
[im 3/3]
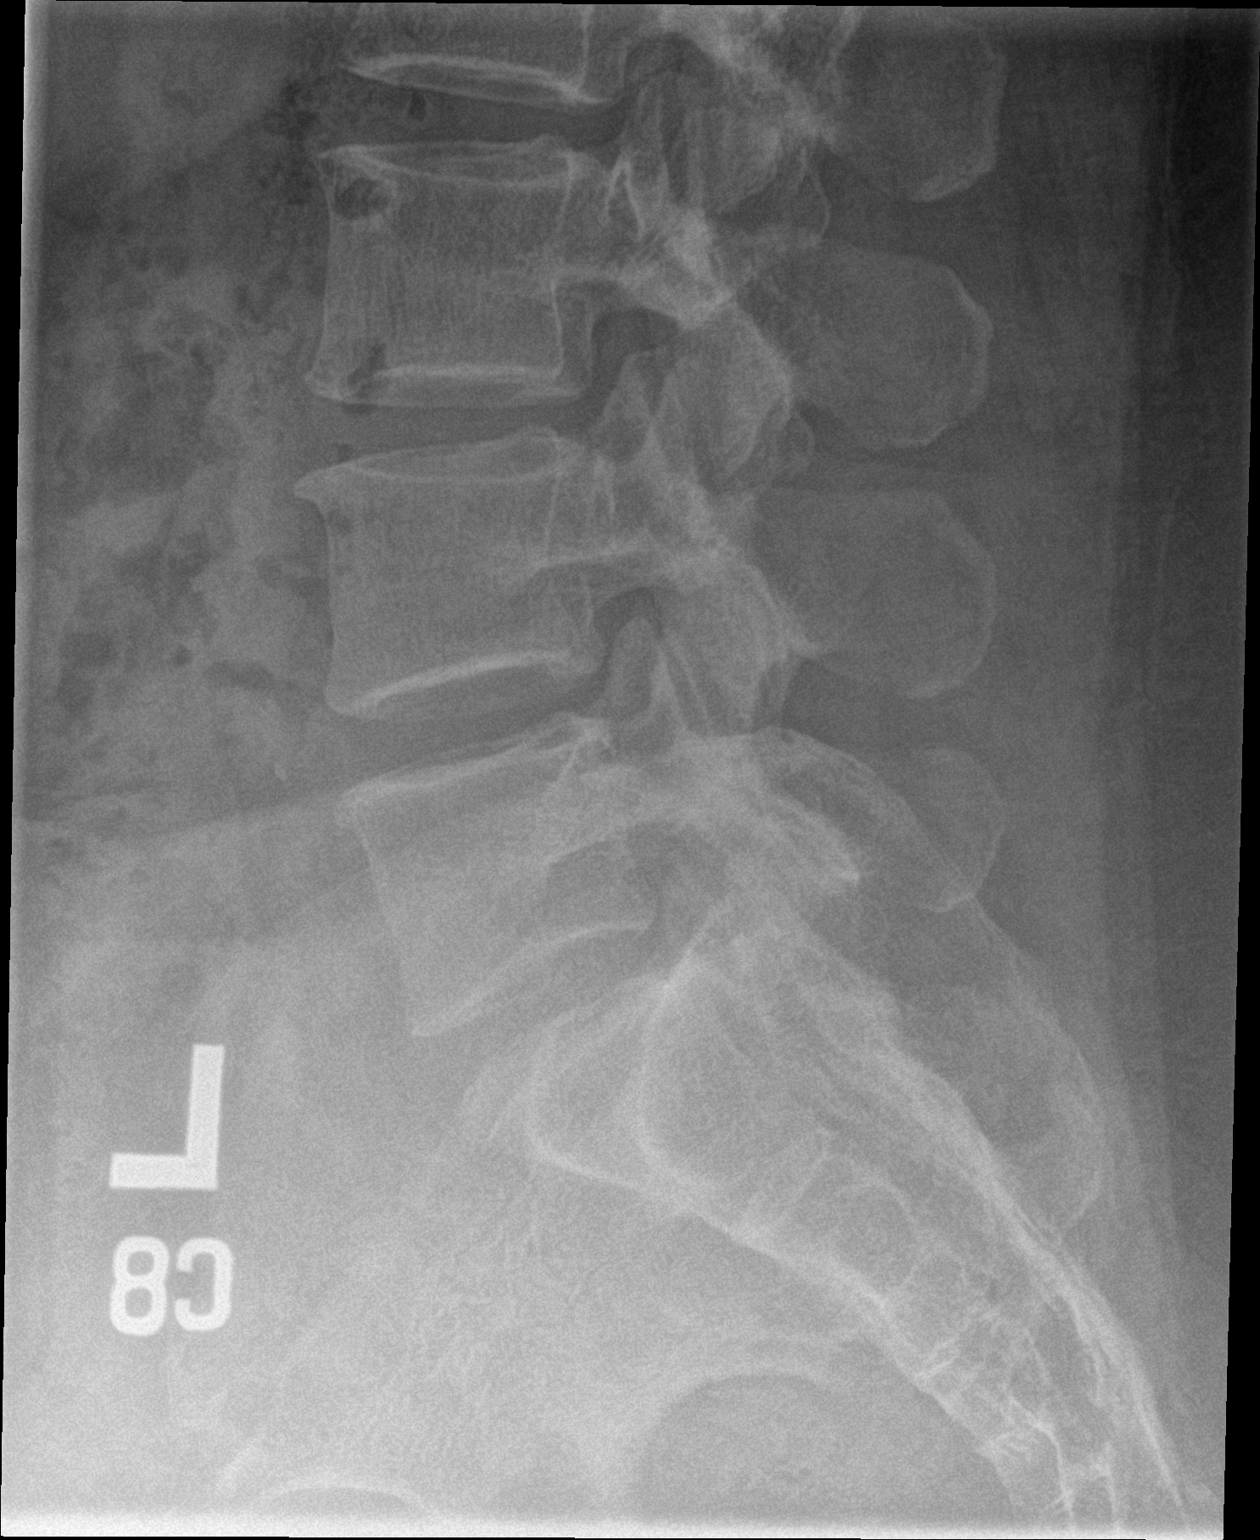

[3 of 3 positions shown; findings below may reference images not displayed]

FINDINGS: Frontal, lateral, and spot lumbosacral lateral images were obtained.
There are 5 non-rib-bearing lumbar type vertebral bodies. There is
no fracture or spondylolisthesis. The disc spaces appear normal. No
appreciable facet arthropathy evident.
IMPRESSION: No fracture or spondylolisthesis.  No evident arthropathy.

## 2020-06-10 IMAGING — CR DG FOOT COMPLETE 3+V*L*
1 series · 3 of 3 positions shown · non-contrast
Comparison: [id number]

CLINICAL DATA: Fall

EXAM:
LEFT FOOT - COMPLETE 3+ VIEW

[Series 1: dg foot complete left · 0.14mm/px · 3 of 3 slices shown]
[im 1/3]
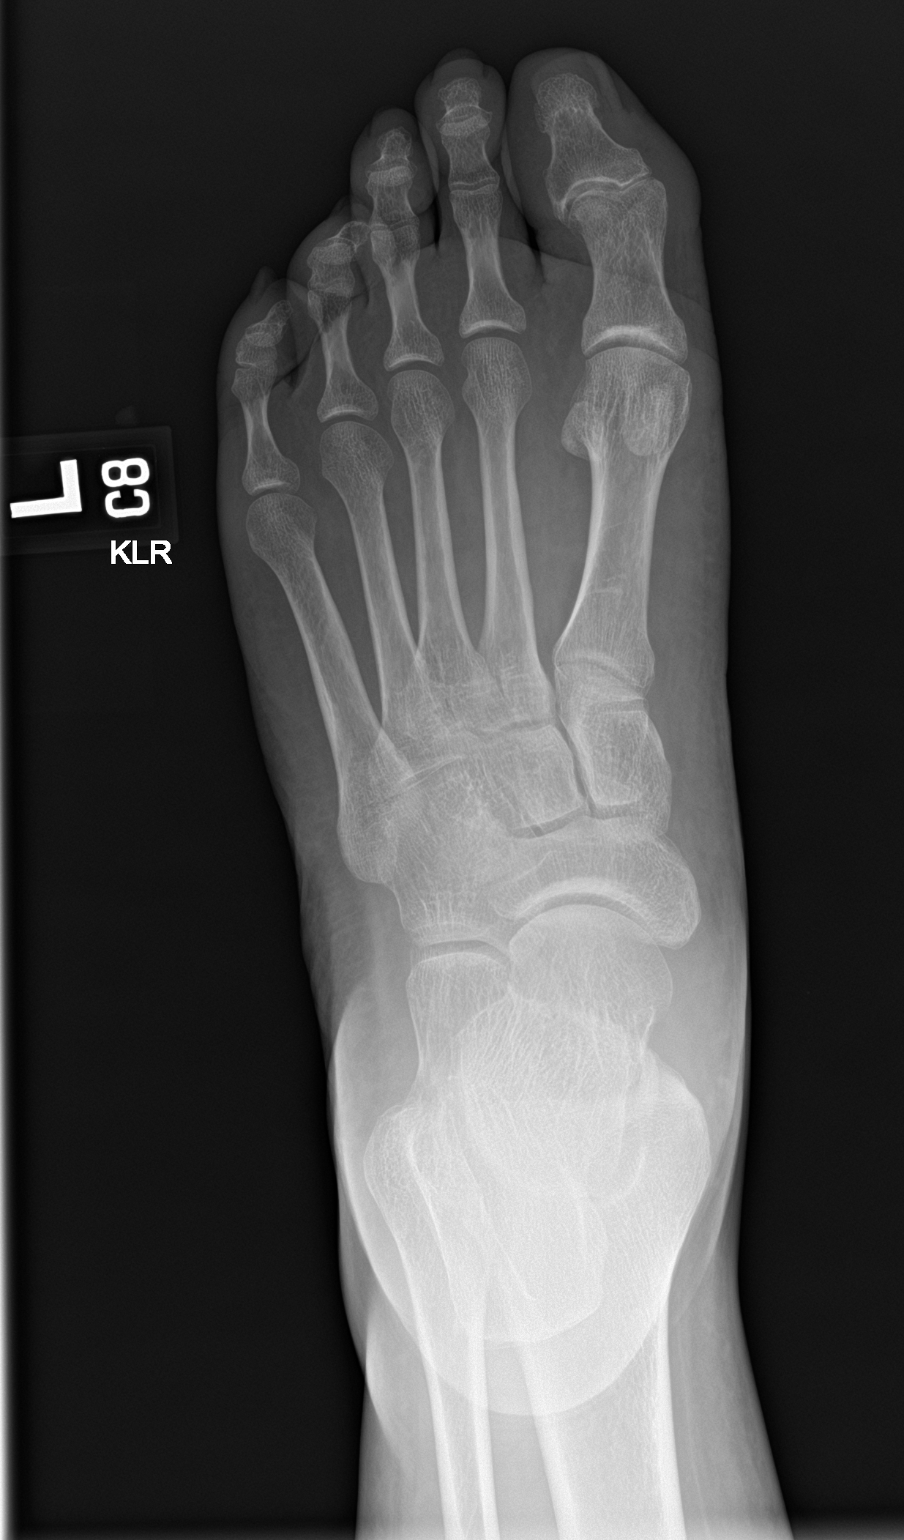
[im 2/3]
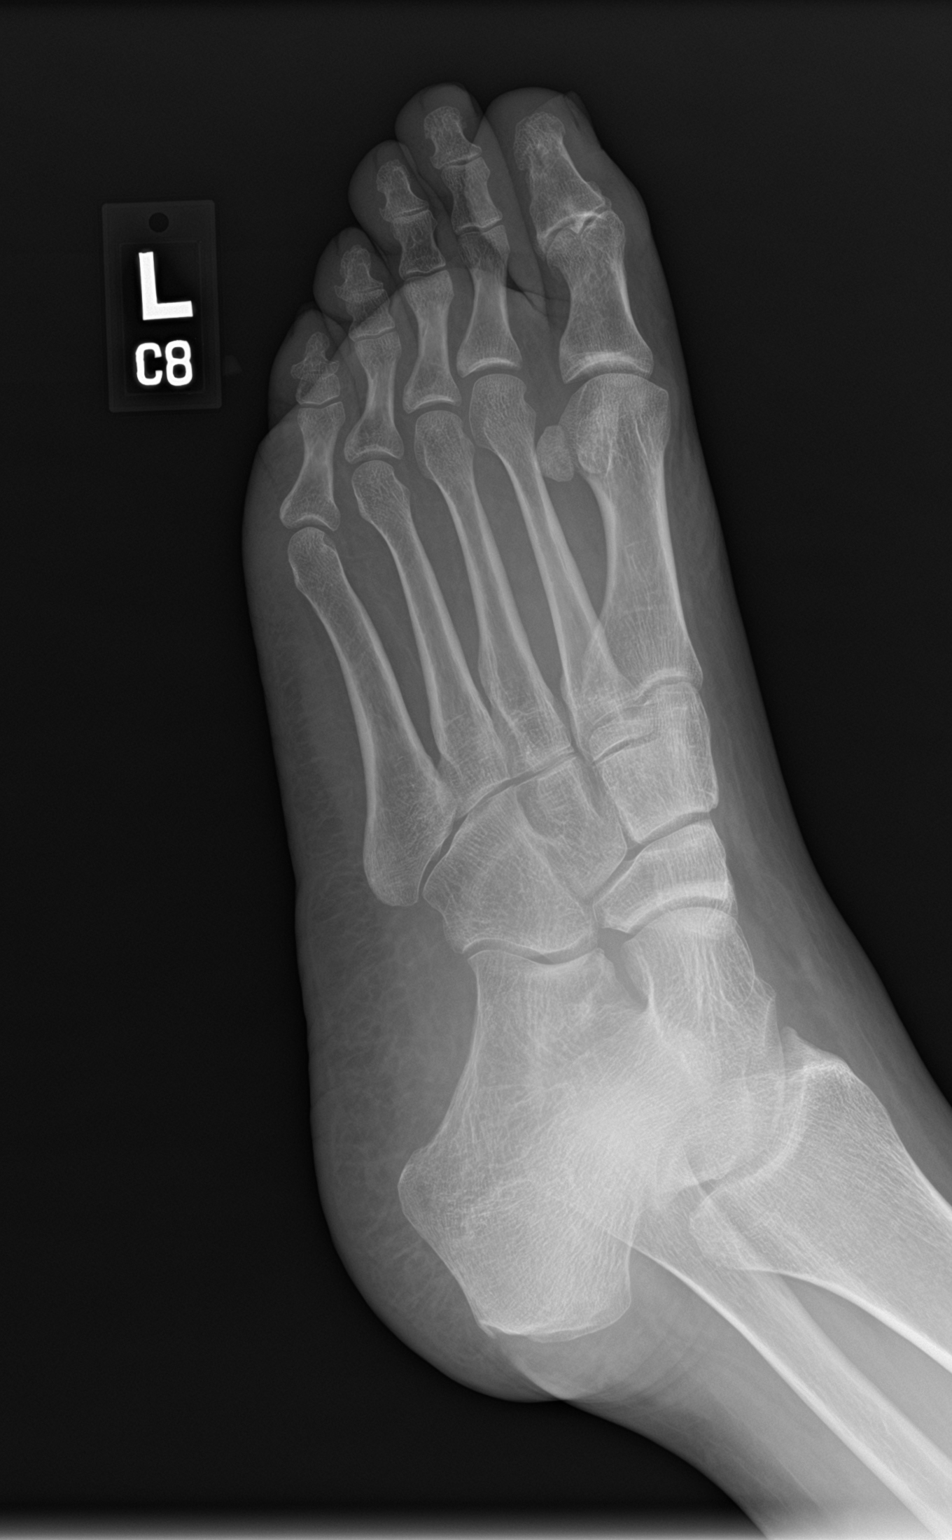
[im 3/3]
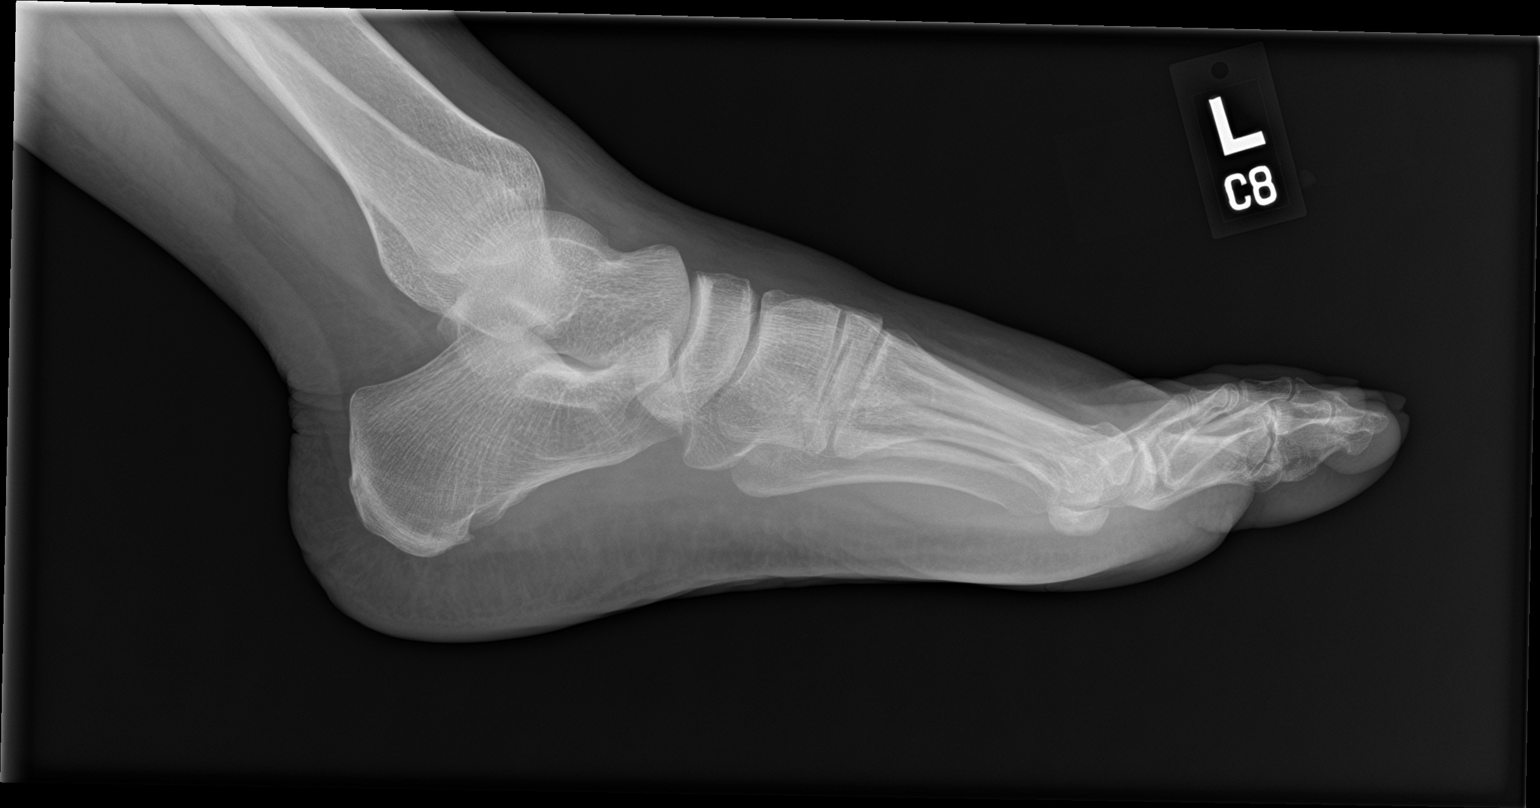

[3 of 3 positions shown; findings below may reference images not displayed]

FINDINGS: Alignment is anatomic. No acute fracture. Joint spaces are
preserved.
IMPRESSION: Negative.

## 2020-06-10 IMAGING — CR DG SHOULDER 2+V*R*
1 series · 4 of 4 positions shown · non-contrast
Comparison: None.

CLINICAL DATA: Pain following fall

EXAM:
RIGHT SHOULDER - 2+ VIEW

[Series 1: dg shoulder right · 0.14mm/px · 4 of 4 slices shown]
[im 1/4]
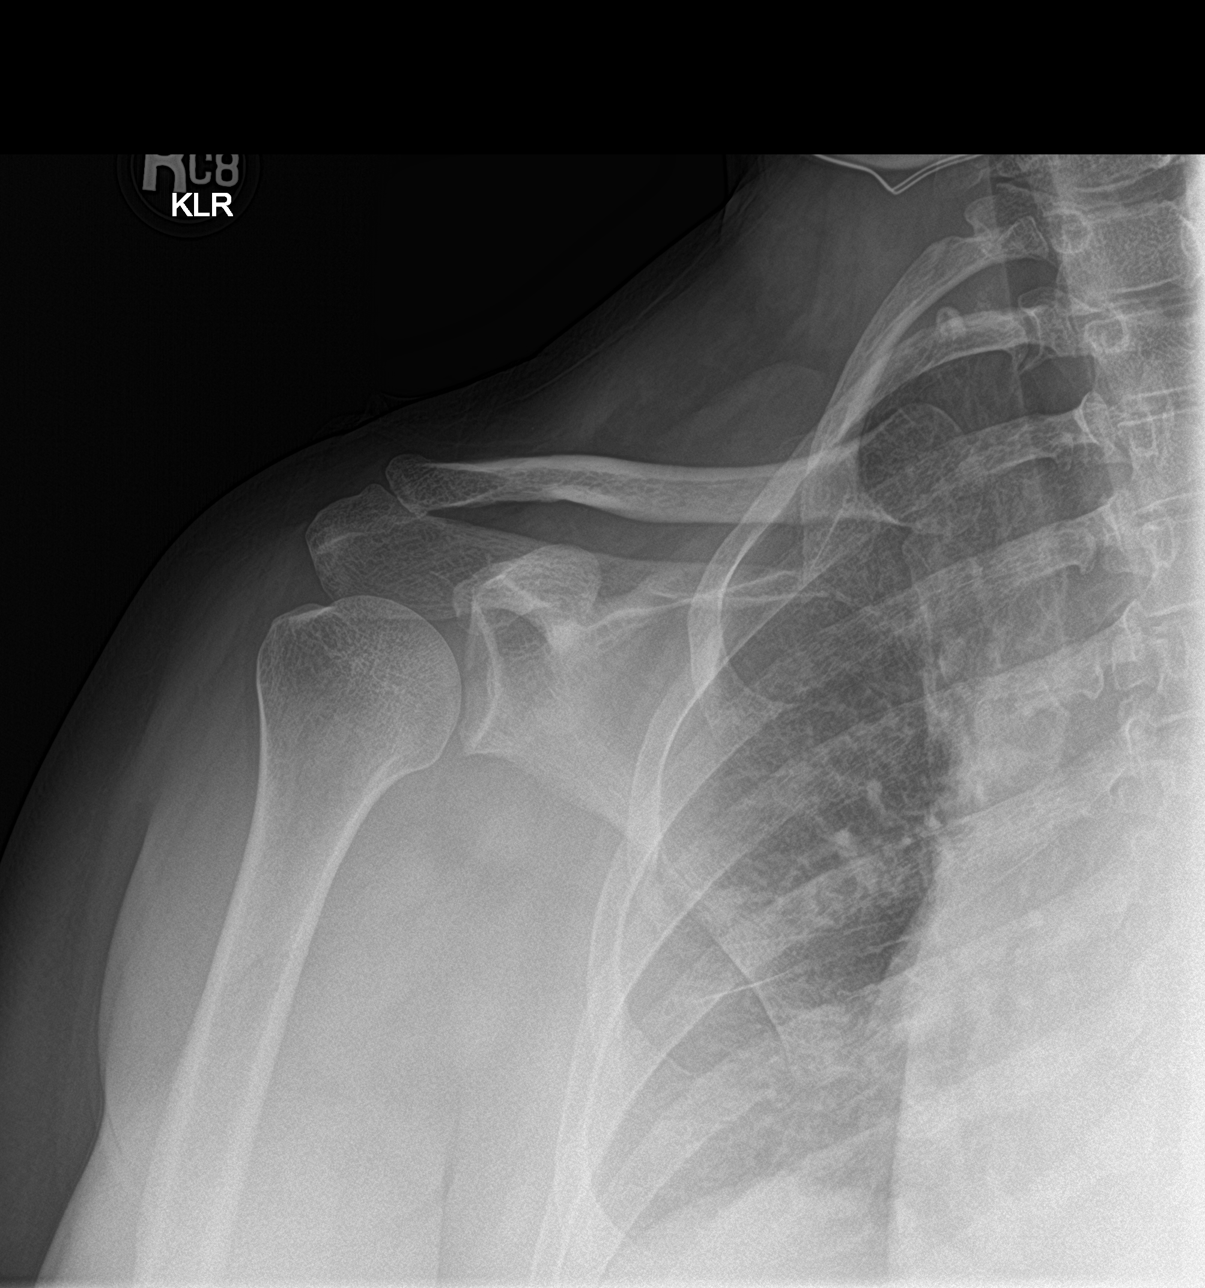
[im 2/4]
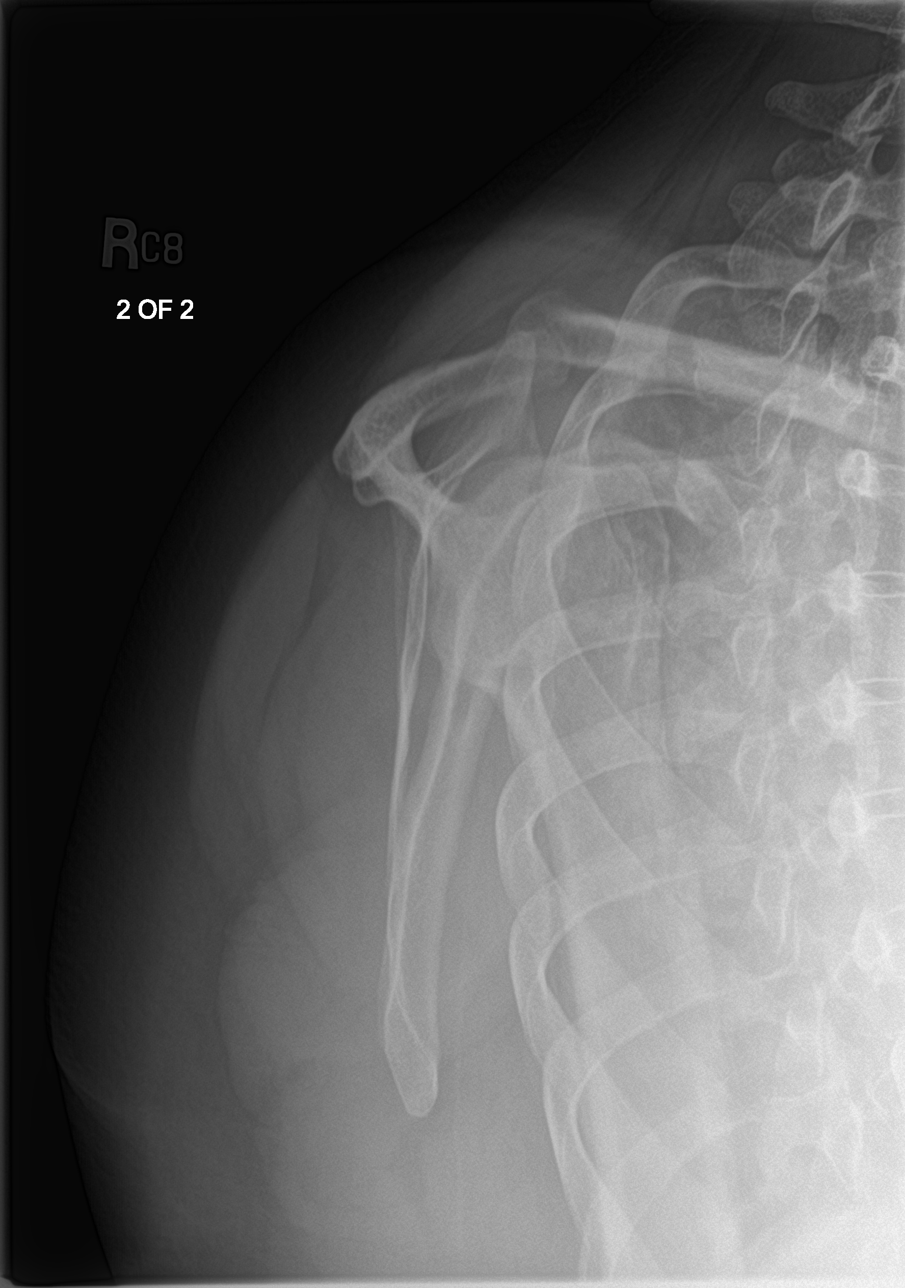
[im 3/4]
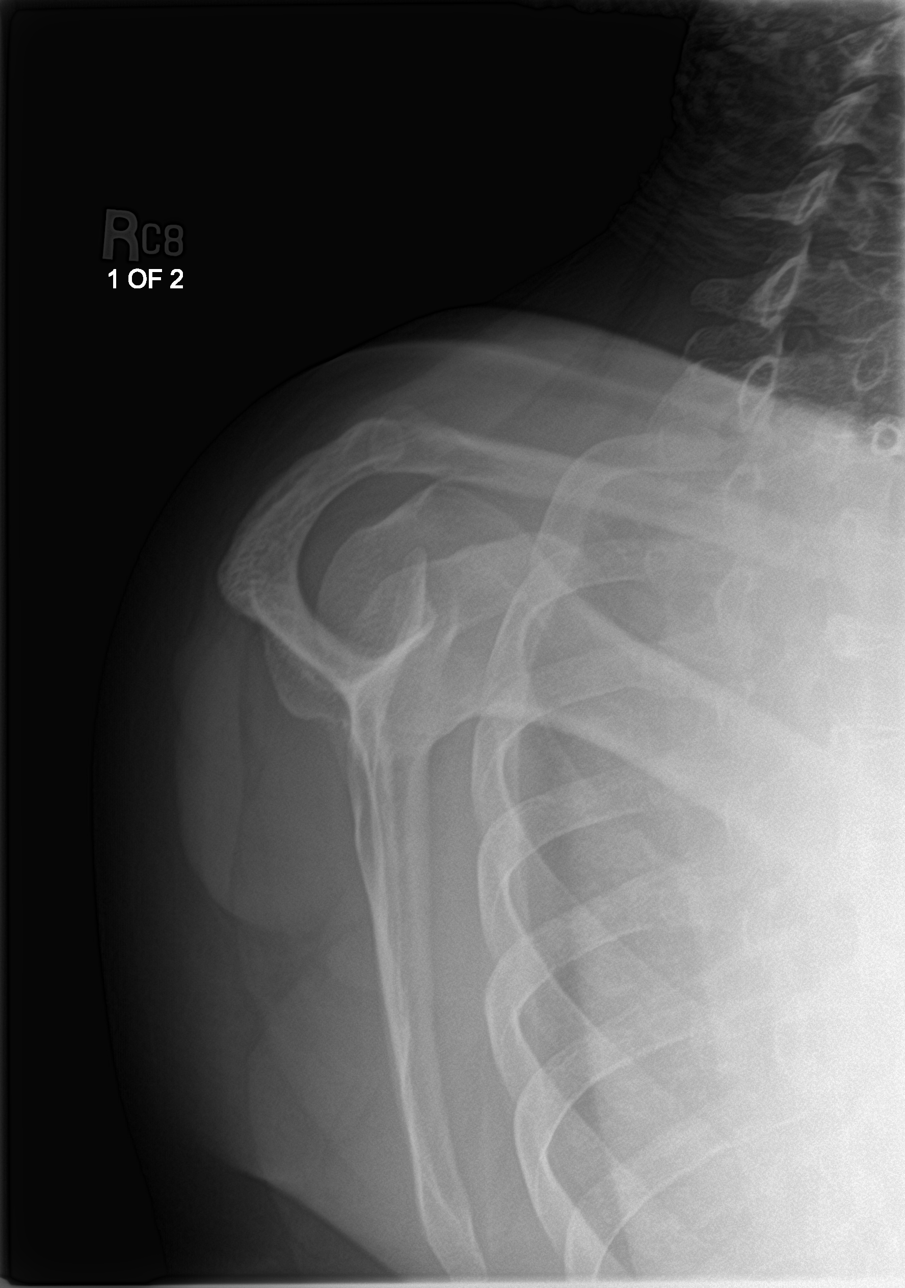
[im 4/4]
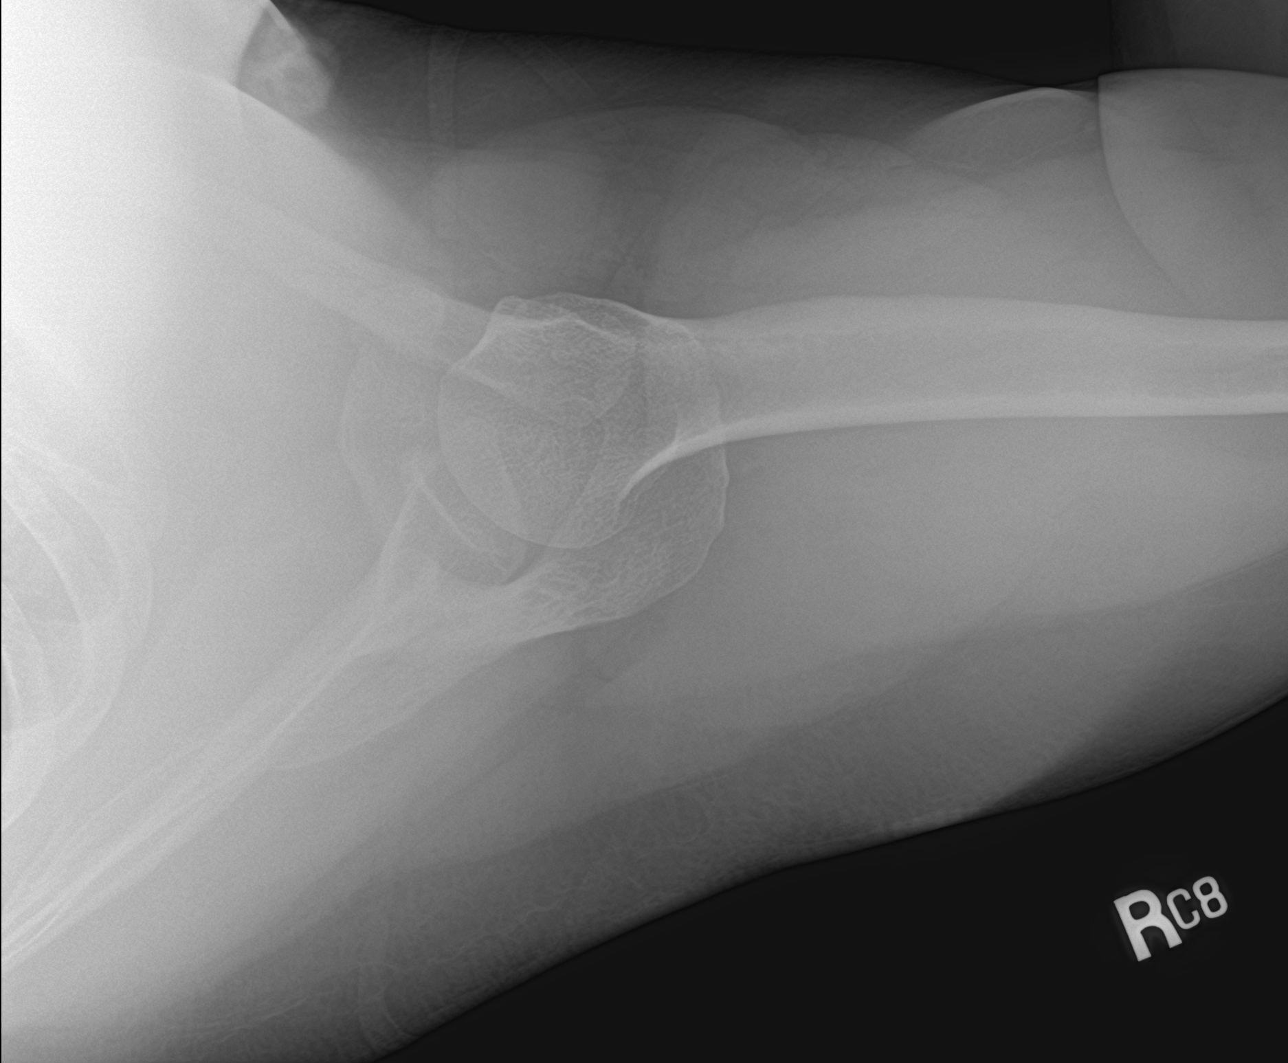

[4 of 4 positions shown; findings below may reference images not displayed]

FINDINGS: Oblique, Y scapular, and axillary images were obtained. No fracture
or dislocation. There is mild narrowing of the acromioclavicular
joint. The glenohumeral joint appears normal. There is no erosive
change or intra-articular calcification. Visualized right lung
clear.
IMPRESSION: Narrowing acromioclavicular joint. Glenohumeral joint appears
unremarkable. No fracture or dislocation.

## 2020-06-20 ENCOUNTER — Ambulatory Visit: Payer: Medicaid Other | Admitting: Surgery

## 2020-06-27 ENCOUNTER — Encounter: Payer: Self-pay | Admitting: Surgery

## 2020-06-27 ENCOUNTER — Ambulatory Visit (INDEPENDENT_AMBULATORY_CARE_PROVIDER_SITE_OTHER): Payer: Medicaid Other | Admitting: Surgery

## 2020-06-27 ENCOUNTER — Other Ambulatory Visit: Payer: Self-pay

## 2020-06-27 VITALS — BP 111/82 | HR 73 | Temp 98.3°F | Ht 65.0 in | Wt 248.0 lb

## 2020-06-27 DIAGNOSIS — K5900 Constipation, unspecified: Secondary | ICD-10-CM

## 2020-06-27 DIAGNOSIS — R109 Unspecified abdominal pain: Secondary | ICD-10-CM

## 2020-06-27 NOTE — Patient Instructions (Addendum)
Add fiber to your diet daily consuming at least 5 grams a day and increase water intake. Consume at least six 8 oz cups/glasses a day. Please give [place] a call if you have any questions or concerns.    Fiber Content in Foods Fiber is a substance that is found in plant foods, such as fruits, vegetables, whole grains, nuts, seeds, and beans. As part of your treatment and recovery plan, your health care provider may recommend that you eat foods that have specific amounts of dietary fiber. Some conditions may require a high-fiber diet while others may require a low-fiber diet. This sheet gives you information about the dietary fiber content of some common foods. Your health care provider will tell you how much fiber you need in your diet. If you have problems or questions, contact your health care provider or dietitian. What foods are high in fiber? Fruits  Blackberries or raspberries (fresh) --  cup (75 g) has 4 g of fiber.  Pear (fresh) -- 1 medium (180 g) has 5.5 g of fiber.  Prunes (dried) -- 6 to 8 pieces (57-76 g) has 5 g of fiber.  Apple with skin -- 1 medium (182 g) has 4.8 g of fiber.  Guava -- 1 cup (128 g) has 8.9 g of fiber. Vegetables  Peas (frozen) --  cup (80 g) has 4.4 g of fiber.  Potato with skin (baked) -- 1 medium (173 g) has 4.4 g of fiber.  Pumpkin (canned) --  cup (122 g) has 5 g of fiber.  Brussels sprouts (cooked) --  cup (78 g) has 4 g of fiber.  Sweet potato --  cup mashed (124 g) has 4 g of fiber.  Winter squash -- 1 cup cooked (205 g) has 5.7 g of fiber. Grains  Bran cereal --  cup (31 g) has 8.6 g of fiber.  Bulgur (cooked) --  cup (70 g) has 4 g of fiber.  Quinoa (cooked) -- 1 cup (185 g) has 5.2 g of fiber.  Popcorn -- 3 cups (375 g) popped has 5.8 g of fiber.  Spaghetti, whole wheat -- 1 cup (140 g) has 6 g of fiber. Meats and other proteins  Pinto beans (cooked) --  cup (90 g) has 7.7 g of fiber.  Lentils (cooked) --  cup (90 g) has  7.8 g of fiber.  Kidney beans (canned) --  cup (92.5 g) has 5.7 g of fiber.  Soybeans (canned, frozen, or fresh) --  cup (92.5 g) has 5.2 g of fiber.  Baked beans, plain or vegetarian (canned) --  cup (130 g) has 5.2 g of fiber.  Garbanzo beans or chickpeas (canned) --  cup (90 g) has 6.6 g of fiber.  Black beans (cooked) --  cup (86 g) has 7.5 g of fiber.  White beans or navy beans (cooked) --  cup (91 g) has 9.3 g of fiber. The items listed above may not be a complete list of foods with high fiber. Actual amounts of fiber may be different depending on processing. Contact a dietitian for more information.   What foods are moderate in fiber? Fruits  Banana -- 1 medium (126 g) has 3.2 g of fiber.  Melon -- 1 cup (155 g) has 1.4 g of fiber.  Orange -- 1 small (154 g) has 3.7 g of fiber.  Raisins --  cup (40 g) has 1.8 g of fiber.  Applesauce, sweetened --  cup (125 g) has 1.5 g of fiber.  Blueberries (fresh) --  cup (75 g) has 1.8 g of fiber.  Strawberries (fresh, sliced) -- 1 cup (962 g) has 3 g of fiber.  Cherries -- 1 cup (140 g) has 2.9 g of fiber. Vegetables  Broccoli (cooked) --  cup (77.5 g) has 2.1 g of fiber.  Carrots (cooked) --  cup (77.5 g) has 2.2 g of fiber.  Corn (canned or frozen) --  cup (82.5 g) has 2.1 g of fiber.  Potatoes, mashed --  cup (105 g) has 1.6 g of fiber.  Tomato -- 1 medium (62 g) has 1.5 g of fiber.  Green beans (canned) --  cup (83 g) has 2 g of fiber.  Squash, winter --  cup (58 g) has 1 g of fiber.  Sweet potato, baked -- 1 medium (150 g) has 3 g of fiber.  Cauliflower (cooked) -- 1/2 cup (90 g) has 2.3 g of fiber. Grains  Long-grain brown rice (cooked) -- 1 cup (196 g) has 3.5 g of fiber.  Bagel, plain -- one 4-inch (10 cm) bagel has 2 g of fiber.  Instant oatmeal --  cup (120 g) has about 2 g of fiber.  Macaroni noodles, enriched (cooked) -- 1 cup (140 g) has 2.5 g of fiber.  Multigrain cereal --  cup  (15 g) has about 2-4 g of fiber.  Whole-wheat bread -- 1 slice (26 g) has 2 g of fiber.  Whole-wheat spaghetti noodles --  cup (70 g) has 3.2 g of fiber.  Corn tortilla -- one 6-inch (15 cm) tortilla has 1.5 g of fiber. Meats and other proteins  Almonds --  cup or 1 oz (28 g) has 3.5 g of fiber.  Sunflower seeds in shell --  cup or  oz (11.5 g) has 1.1 g of fiber.  Vegetable or soy patty -- 1 patty (70 g) has 3.4 g of fiber.  Walnuts --  cup or 1 oz (30 g) has 2 g of fiber.  Flax seed -- 1 Tbsp (7 g) has 2.8 g of fiber. The items listed above may not be a complete list of foods that have moderate amounts of fiber. Actual amounts of fiber may be different depending on processing. Contact a dietitian for more information.   What foods are low in fiber? Low-fiber foods contain less than 1 g of fiber per serving. They include: Fruits  Fruit juice --  cup or 4 fl oz (118 mL) has 0.5 g of fiber. Vegetables  Lettuce -- 1 cup (35 g) has 0.5 g of fiber.  Cucumber (slices) --  cup (60 g) has 0.3 g of fiber.  Celery -- 1 stalk (40 g) has 0.1 g of fiber. Grains  Flour tortilla -- one 6-inch (15 cm) tortilla has 0.5 g of fiber.  White rice (cooked) --  cup (81.5 g) has 0.3 g of fiber. Meats and other proteins  Egg -- 1 large (50 g) has 0 g of fiber.  Meat, poultry, or fish -- 3 oz (85 g) has 0 g of fiber. Dairy  Milk -- 1 cup or 8 fl oz (237 mL) has 0 g of fiber.  Yogurt -- 1 cup (245 g) has 0 g of fiber. The items listed above may not be a complete list of foods that are low in fiber. Actual amounts of fiber may be different depending on processing. Contact a dietitian for more information.   Summary  Fiber is a substance that is found in plant  foods, such as fruits, vegetables, whole grains, nuts, seeds, and beans.  As part of your treatment and recovery plan, your health care provider may recommend that you eat foods that have specific amounts of dietary fiber. This  information is not intended to replace advice given to you by your health care provider. Make sure you discuss any questions you have with your health care provider. Document Revised: 07/27/2019 Document Reviewed: 07/27/2019 Elsevier Patient Education  2021 [place] Following a healthy eating pattern may help you to achieve and maintain a healthy body weight, reduce the risk of chronic disease, and live a long and productive life. It is important to follow a healthy eating pattern at an appropriate calorie level for your body. Your nutritional needs should be met primarily through food by choosing a variety of nutrient-rich foods. What are tips for following this plan? Reading food labels  Read labels and choose the following: ? Reduced or low sodium. ? Juices with 100% fruit juice. ? Foods with low saturated fats and high polyunsaturated and monounsaturated fats. ? Foods with whole grains, such as whole wheat, cracked wheat, brown rice, and wild rice. ? Whole grains that are fortified with folic acid. This is recommended for women who are pregnant or who want to become pregnant.  Read labels and avoid the following: ? Foods with a lot of added sugars. These include foods that contain brown sugar, corn sweetener, corn syrup, dextrose, fructose, glucose, high-fructose corn syrup, honey, invert sugar, lactose, malt syrup, maltose, molasses, raw sugar, sucrose, trehalose, or turbinado sugar.  Do not eat more than the following amounts of added sugar per day:  6 teaspoons (25 g) for women.  9 teaspoons (38 g) for men. ? Foods that contain processed or refined starches and grains. ? Refined grain products, such as white flour, degermed cornmeal, white bread, and white rice. Shopping  Choose nutrient-rich snacks, such as vegetables, whole fruits, and nuts. Avoid high-calorie and high-sugar snacks, such as potato chips, fruit snacks, and candy.  Use oil-based dressings  and spreads on foods instead of solid fats such as butter, stick margarine, or cream cheese.  Limit pre-made sauces, mixes, and "instant" products such as flavored rice, instant noodles, and ready-made pasta.  Try more plant-protein sources, such as tofu, tempeh, black beans, edamame, lentils, nuts, and seeds.  Explore eating plans such as the Mediterranean diet or vegetarian diet. Cooking  Use oil to saut or stir-fry foods instead of solid fats such as butter, stick margarine, or lard.  Try baking, boiling, grilling, or broiling instead of frying.  Remove the fatty part of meats before cooking.  Steam vegetables in water or broth. Meal planning  At meals, imagine dividing your plate into fourths: ? One-half of your plate is fruits and vegetables. ? One-fourth of your plate is whole grains. ? One-fourth of your plate is protein, especially lean meats, poultry, eggs, tofu, beans, or nuts.  Include low-fat dairy as part of your daily diet.   Lifestyle  Choose healthy options in all settings, including home, work, school, restaurants, or stores.  Prepare your food safely: ? Wash your hands after handling raw meats. ? Keep food preparation surfaces clean by regularly washing with hot, soapy water. ? Keep raw meats separate from ready-to-eat foods, such as fruits and vegetables. ? Cook seafood, meat, poultry, and eggs to the recommended internal temperature. ? Store foods at safe temperatures. In general:  Keep cold foods at 61F (4.4C) or  below.  Keep hot foods at 140F (60C) or above.  Keep your freezer at [facility] (-17.8C) or below.  Foods are no longer safe to eat when they have been between the temperatures of 40-140F (4.4-60C) for more than 2 hours. What foods should I eat? Fruits Aim to eat 2 cup-equivalents of fresh, canned (in natural juice), or frozen fruits each day. Examples of 1 cup-equivalent of fruit include 1 small apple, 8 large strawberries, 1 cup canned  fruit,  cup dried fruit, or 1 cup 100% juice. Vegetables Aim to eat 2-3 cup-equivalents of fresh and frozen vegetables each day, including different varieties and colors. Examples of 1 cup-equivalent of vegetables include 2 medium carrots, 2 cups raw, leafy greens, 1 cup chopped vegetable (raw or cooked), or 1 medium baked potato. Grains Aim to eat 6 ounce-equivalents of whole grains each day. Examples of 1 ounce-equivalent of grains include 1 slice of bread, 1 cup ready-to-eat cereal, 3 cups popcorn, or  cup cooked rice, pasta, or cereal. Meats and other proteins Aim to eat 5-6 ounce-equivalents of protein each day. Examples of 1 ounce-equivalent of protein include 1 egg, 1/2 cup nuts or seeds, or 1 tablespoon (16 g) peanut butter. A cut of meat or fish that is the size of a deck of cards is about 3-4 ounce-equivalents.  Of the protein you eat each week, try to have at least 8 ounces come from seafood. This includes salmon, trout, herring, and anchovies. Dairy Aim to eat 3 cup-equivalents of fat-free or low-fat dairy each day. Examples of 1 cup-equivalent of dairy include 1 cup (240 mL) milk, 8 ounces (250 g) yogurt, 1 ounces (44 g) natural cheese, or 1 cup (240 mL) fortified soy milk. Fats and oils  Aim for about 5 teaspoons (21 g) per day. Choose monounsaturated fats, such as canola and olive oils, avocados, peanut butter, and most nuts, or polyunsaturated fats, such as sunflower, corn, and soybean oils, walnuts, pine nuts, sesame seeds, sunflower seeds, and flaxseed. Beverages  Aim for six 8-oz glasses of water per day. Limit coffee to three to five 8-oz cups per day.  Limit caffeinated beverages that have added calories, such as soda and energy drinks.  Limit alcohol intake to no more than 1 drink a day for nonpregnant women and 2 drinks a day for men. One drink equals 12 oz of beer (355 mL), 5 oz of wine (148 mL), or 1 oz of hard liquor (44 mL). Seasoning and other foods  Avoid  adding excess amounts of salt to your foods. Try flavoring foods with herbs and spices instead of salt.  Avoid adding sugar to foods.  Try using oil-based dressings, sauces, and spreads instead of solid fats. This information is based on general [place] nutrition guidelines. For more information, visit [employer]. Exact amounts may vary based on your nutrition needs. Summary  A healthy eating plan may help you to maintain a healthy weight, reduce the risk of chronic diseases, and stay active throughout your life.  Plan your meals. Make sure you eat the right portions of a variety of nutrient-rich foods.  Try baking, boiling, grilling, or broiling instead of frying.  Choose healthy options in all settings, including home, work, school, restaurants, or stores. This information is not intended to replace advice given to you by your health care provider. Make sure you discuss any questions you have with your health care provider. Document Revised: 07/05/2017 Document Reviewed: 07/05/2017 Elsevier Patient Education  [place].

## 2020-06-27 NOTE — Progress Notes (Signed)
Surgical Clinic Progress/Follow-up Note   HPI:  50 y.o. Female presents to clinic for abdominal pain follow-up she was last seen in routine follow-up after her laparoscopic cholecystectomy from September 13, 2019.  She presents today with multiple complaints including abdominal pain and swelling in legs.  She is concerned about a bulge in the center of her abdomen, with pain in the left and right epigastrium is.  She also reports radiation of pain into her suprapubic region.  She reports she had such pain that she threw up yellow emesis last week.  She denies fevers and chills.  She reports having headache with blurred vision after throwing up.  She reports she will go 2 days without having a bowel movement.  Her movements are all a small and extremely well formed.  She states she began taking a stool softener, but has not seen any improvement.  She reports her urine has been very dark of recent.   When addressing her concerns he she reports that her primary concern was that her mesh may have become infected.  But after further questioning it seems that she is primarily concerned about her abdominal bloating and her current abdominal girth.  Upon further questioning she admits her degree of constipation.  She continues to smoke and she reports she has difficulty just drinking water, preferring [facility].  Review of Systems:  Constitutional: denies fever/chills  ENT: denies sore throat, hearing problems  Respiratory: denies shortness of breath, wheezing  Cardiovascular: denies chest pain, palpitations  Gastrointestinal: as per interval history Skin: Denies any other rashes or skin discolorations Vital Signs:  BP 111/82   Pulse 73   Temp 98.3 F (36.8 C) (Oral)   Ht 5\' 5"  (1.651 m)   Wt 248 lb (112.5 kg)   LMP 05/07/2016   SpO2 95%   BMI 41.27 kg/m    Physical Exam:  Constitutional:  -- Obese body habitus  -- Awake, alert, and oriented x3  Pulmonary:  -- No crackles -- Equal breath  sounds bilaterally -- Breathing non-labored at rest Cardiovascular:  -- S1, S2 present  -- No pericardial rubs  Gastrointestinal:  --Well rounded, no asymmetry or bulges with head lift.  Soft and non-distended, non-tender/with no tenderness to palpation, no guarding/rebound tenderness -- Post-surgical incisions all well-approximated without any peri-incisional erythema or drainage -- No abdominal masses appreciated, pulsatile or otherwise   Musculoskeletal / Integumentary:  -- Wounds or skin discoloration: None appreciated -- Extremities: B/L UE and LE FROM, hands and feet warm, trace bilateral ankle edema   Laboratory studies: None recent.  Imaging: No new pertinent imaging available for review   Assessment:  50 y.o. yo Female with a problem list including...  Patient Active Problem List   Diagnosis Date Noted  . Fatigue 04/05/2020  . Encounter to establish care with new doctor 01/22/2020  . Encounter for hepatitis C screening test for low risk patient 01/22/2020  . Screening examination for sexually transmitted disease 01/22/2020  . Skin tag 01/22/2020  . Nonintractable headache 01/22/2020  . Status post laparoscopic cholecystectomy 09/28/2019  . Dyspnea on exertion 08/15/2018  . Multinodular goiter 04/28/2017  . Paroxysmal atrial fibrillation (HCC) 10/29/2016  . PFO (patent foramen ovale) 09/23/2016  . Hyperlipidemia 09/17/2016  . IFG (impaired fasting glucose) 09/17/2016  . History of stroke 09/06/2016  . Myopia 12/20/2014  . Abdominal pain 12/20/2014  . Depression 12/20/2014  . Right arm pain 11/21/2014  . 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  Thyroid activity decreased 10/18/2014  . Abnormal CBC 10/18/2014  . Tobacco abuse 10/18/2014  . Previous known suicide attempt 04/20/2014    presents to clinic for follow-up evaluation of abdominal concerns, progressing well.  Plan:   - Advised to pursue a goal of 25 to 30 g of  fiber daily.  Made aware that the majority of this may be through natural sources, but advised to be aware of actual consumption and to ensure minimal consumption by daily supplementation.  Various forms of supplements discussed.  Strongly advised to consume more fluids to ensure adequate hydration, instructed to watch color of urine to determine adequacy of hydration.  Clarity is pursued in urine output, and bowel activity that correlates to significant meal intake.  Patient is to avoid deferring having bowel movements, advised to take the time at the first sign of sensation, typically following meals and in the morning.  Subsequent utilization of MiraLAX to ensure at least daily movement, ideally twice daily bowel movements.  If multiple doses of MiraLAX are necessary utilize them.              - return to clinic as needed, instructed to call office if any questions or concerns  All of the above recommendations were discussed with the patient and patient's family, and all of patient's and family's questions were answered to their expressed satisfaction.  Campbell Lerner, MD, FACS : [facility] General Surgery - Partnering for exceptional care. Office: [phone]

## 2020-07-03 ENCOUNTER — Other Ambulatory Visit: Payer: Self-pay | Admitting: Orthopedic Surgery

## 2020-07-08 ENCOUNTER — Inpatient Hospital Stay: Admission: RE | Admit: 2020-07-08 | Payer: Medicaid Other | Source: Ambulatory Visit

## 2020-07-09 ENCOUNTER — Other Ambulatory Visit: Payer: Self-pay

## 2020-07-09 ENCOUNTER — Other Ambulatory Visit: Payer: Medicaid Other

## 2020-07-09 ENCOUNTER — Other Ambulatory Visit
Admission: RE | Admit: 2020-07-09 | Discharge: 2020-07-09 | Disposition: A | Discharge status: Home / Self Care | Payer: Medicaid Other | Source: Ambulatory Visit | Attending: Orthopedic Surgery | Admitting: Orthopedic Surgery

## 2020-07-09 NOTE — Patient Instructions (Addendum)
Your procedure is scheduled on: 07/11/20 - Thursday Report to the Registration Desk on the 1st floor of the [facility]. To find out your arrival time, please call [phone] between 1PM - 3PM on: 07/10/20  REMEMBER: Instructions that are not followed completely may result in serious medical risk, up to and including death; or upon the discretion of your surgeon and anesthesiologist your surgery may need to be rescheduled.  Do not eat food after midnight the night before surgery.  No gum chewing, lozengers or hard candies.  You may however, drink CLEAR liquids up to 2 hours before you are scheduled to arrive for your surgery. Do not drink anything within 2 hours of your scheduled arrival time.  Clear liquids include: - water  - apple juice without pulp - gatorade (not RED, PURPLE, OR BLUE) - black coffee or tea (Do NOT add milk or creamers to the coffee or tea) Do NOT drink anything that is not on this list.   TAKE THESE MEDICATIONS THE MORNING OF SURGERY WITH A SIP OF WATER: - levothyroxine (SYNTHROID) 100 MCG tablet - metoprolol tartrate (LOPRESSOR) 25 MG table  Follow recommendations from Cardiologist, Pulmonologist or PCP regarding stopping Aspirin, Coumadin, Plavix, Eliquis, Pradaxa, or Pletal. Stop Eliquis starting 07/09/20.  One week prior to surgery: Stop Anti-inflammatories (NSAIDS) such as Advil, Aleve, Ibuprofen, Motrin, Naproxen, Naprosyn and Aspirin based products such as Excedrin, Goodys Powder, BC Powder.  Stop ANY OVER THE COUNTER supplements until after surgery.  No Alcohol for 24 hours before or after surgery.  No Smoking including e-cigarettes for 24 hours prior to surgery.  No chewable tobacco products for at least 6 hours prior to surgery.  No nicotine patches on the day of surgery.  Do not use any "recreational" drugs for at least a week prior to your surgery.  Please be advised that the combination of cocaine and anesthesia may have negative outcomes,  up to and including death. If you test positive for cocaine, your surgery will be cancelled.  On the morning of surgery brush your teeth with toothpaste and water, you may rinse your mouth with mouthwash if you wish. Do not swallow any toothpaste or mouthwash.  Do not wear jewelry, make-up, hairpins, clips or nail polish.  Do not wear lotions, powders, or perfumes.   Do not shave body from the neck down 48 hours prior to surgery just in case you cut yourself which could leave a site for infection.  Also, freshly shaved skin may become irritated if using the CHG soap.  Contact lenses, hearing aids and dentures may not be worn into surgery.  Do not bring valuables to the hospital. [facility] is not responsible for any missing/lost belongings or valuables.   Notify your doctor if there is any change in your medical condition (cold, fever, infection).  Wear comfortable clothing (specific to your surgery type) to the hospital.  Plan for stool softeners for home use; pain medications have a tendency to cause constipation. You can also help prevent constipation by eating foods high in fiber such as fruits and vegetables and drinking plenty of fluids as your diet allows.  After surgery, you can help prevent lung complications by doing breathing exercises.  Take deep breaths and cough every 1-2 hours. Your doctor may order a device called an Incentive Spirometer to help you take deep breaths. When coughing or sneezing, hold a pillow firmly against your incision with both hands. This is called "splinting." Doing this helps protect your incision.  It also decreases belly discomfort.  If you are being admitted to the hospital overnight, leave your suitcase in the car. After surgery it may be brought to your room.  If you are being discharged the day of surgery, you will not be allowed to drive home. You will need a responsible adult (18 years or older) to drive you home and stay with you that  night.   If you are taking public transportation, you will need to have a responsible adult (18 years or older) with you. Please confirm with your physician that it is acceptable to use public transportation.   Please call the Pre-admissions Testing Dept. at [phone] if you have any questions about these instructions.  Surgery Visitation Policy:  Patients undergoing a surgery or procedure may have one family member or support person with them as long as that person is not COVID-19 positive or experiencing its symptoms.  That person may remain in the waiting area during the procedure.  Inpatient Visitation:    Visiting hours are 7 a.m. to 8 p.m. Inpatients will be allowed two visitors daily. The visitors may change each day during the patient's stay. No visitors under the age of 96. Any visitor under the age of 80 must be accompanied by an adult. The visitor must pass COVID-19 screenings, use hand sanitizer when entering and exiting the patient's room and wear a mask at all times, including in the patient's room. Patients must also wear a mask when staff or their visitor are in the room. Masking is required regardless of vaccination status.

## 2020-07-11 ENCOUNTER — Ambulatory Visit: Admission: RE | Admit: 2020-07-11 | Payer: Medicaid Other | Source: Home / Self Care | Admitting: Orthopedic Surgery

## 2020-07-11 ENCOUNTER — Encounter: Admission: RE | Payer: Self-pay | Source: Home / Self Care

## 2020-07-11 SURGERY — ARTHROSCOPY, KNEE, WITH MEDIAL MENISCECTOMY
Anesthesia: Choice | Site: Knee | Laterality: Left

## 2020-07-17 ENCOUNTER — Emergency Department
Admission: EM | Admit: 2020-07-17 | Discharge: 2020-07-18 | Disposition: A | Discharge status: Home / Self Care | Payer: Medicaid Other | Attending: Emergency Medicine | Admitting: Emergency Medicine

## 2020-07-17 ENCOUNTER — Emergency Department: Payer: Medicaid Other

## 2020-07-17 ENCOUNTER — Other Ambulatory Visit: Payer: Self-pay

## 2020-07-17 DIAGNOSIS — K439 Ventral hernia without obstruction or gangrene: Secondary | ICD-10-CM | POA: Diagnosis not present

## 2020-07-17 DIAGNOSIS — J45909 Unspecified asthma, uncomplicated: Secondary | ICD-10-CM | POA: Diagnosis not present

## 2020-07-17 DIAGNOSIS — Z20822 Contact with and (suspected) exposure to covid-19: Secondary | ICD-10-CM | POA: Insufficient documentation

## 2020-07-17 DIAGNOSIS — R109 Unspecified abdominal pain: Secondary | ICD-10-CM | POA: Diagnosis present

## 2020-07-17 DIAGNOSIS — Z7901 Long term (current) use of anticoagulants: Secondary | ICD-10-CM | POA: Insufficient documentation

## 2020-07-17 DIAGNOSIS — E039 Hypothyroidism, unspecified: Secondary | ICD-10-CM | POA: Diagnosis not present

## 2020-07-17 DIAGNOSIS — F17210 Nicotine dependence, cigarettes, uncomplicated: Secondary | ICD-10-CM | POA: Diagnosis not present

## 2020-07-17 DIAGNOSIS — Z79899 Other long term (current) drug therapy: Secondary | ICD-10-CM | POA: Diagnosis not present

## 2020-07-17 DIAGNOSIS — K436 Other and unspecified ventral hernia with obstruction, without gangrene: Secondary | ICD-10-CM

## 2020-07-17 DIAGNOSIS — I480 Paroxysmal atrial fibrillation: Secondary | ICD-10-CM | POA: Diagnosis not present

## 2020-07-17 LAB — URINALYSIS, ROUTINE W REFLEX MICROSCOPIC
Bilirubin Urine: NEGATIVE
Glucose, UA: NEGATIVE mg/dL
Hgb urine dipstick: NEGATIVE
Ketones, ur: NEGATIVE mg/dL
Leukocytes,Ua: NEGATIVE
Nitrite: NEGATIVE
Protein, ur: NEGATIVE mg/dL
Specific Gravity, Urine: 1.018 (ref 1.005–1.030)
pH: 5 (ref 5.0–8.0)

## 2020-07-17 LAB — CBC WITH DIFFERENTIAL/PLATELET
Abs Immature Granulocytes: 0.03 10*3/uL (ref 0.00–0.07)
Basophils Absolute: 0.1 10*3/uL (ref 0.0–0.1)
Basophils Relative: 0 %
Eosinophils Absolute: 0.2 10*3/uL (ref 0.0–0.5)
Eosinophils Relative: 1 %
HCT: 34.4 % — ABNORMAL LOW (ref 36.0–46.0)
Hemoglobin: 11.3 g/dL — ABNORMAL LOW (ref 12.0–15.0)
Immature Granulocytes: 0 %
Lymphocytes Relative: 35 %
Lymphs Abs: 4.8 10*3/uL — ABNORMAL HIGH (ref 0.7–4.0)
MCH: 27.6 pg (ref 26.0–34.0)
MCHC: 32.8 g/dL (ref 30.0–36.0)
MCV: 84.1 fL (ref 80.0–100.0)
Monocytes Absolute: 0.7 10*3/uL (ref 0.1–1.0)
Monocytes Relative: 5 %
Neutro Abs: 8.1 10*3/uL — ABNORMAL HIGH (ref 1.7–7.7)
Neutrophils Relative %: 59 %
Platelets: 347 10*3/uL (ref 150–400)
RBC: 4.09 MIL/uL (ref 3.87–5.11)
RDW: 14.4 % (ref 11.5–15.5)
Smear Review: NORMAL
WBC: 13.8 10*3/uL — ABNORMAL HIGH (ref 4.0–10.5)
nRBC: 0 % (ref 0.0–0.2)

## 2020-07-17 LAB — POC URINE PREG, ED: Preg Test, Ur: NEGATIVE

## 2020-07-17 LAB — BASIC METABOLIC PANEL
Anion gap: 8 (ref 5–15)
BUN: 8 mg/dL (ref 6–20)
CO2: 27 mmol/L (ref 22–32)
Calcium: 8.7 mg/dL — ABNORMAL LOW (ref 8.9–10.3)
Chloride: 106 mmol/L (ref 98–111)
Creatinine, Ser: 0.8 mg/dL (ref 0.44–1.00)
GFR, Estimated: >60 mL/min (ref >60)
Glucose, Bld: 124 mg/dL — ABNORMAL HIGH (ref 70–99)
Potassium: 3 mmol/L — ABNORMAL LOW (ref 3.5–5.1)
Sodium: 141 mmol/L (ref 135–145)

## 2020-07-17 LAB — TROPONIN I (HIGH SENSITIVITY): Troponin I (High Sensitivity): 15 ng/L (ref <18)

## 2020-07-17 LAB — LIPASE, BLOOD: Lipase: 42 U/L (ref 11–51)

## 2020-07-17 MED ORDER — HYDROCODONE-ACETAMINOPHEN 5-325 MG PO TABS: 1.0000 | ORAL_TABLET | Freq: Four times a day (QID) | ORAL | 0 refills | Status: DC | PRN

## 2020-07-17 MED ORDER — SODIUM CHLORIDE 0.9 % IV BOLUS
500.0000 mL | Freq: Once | INTRAVENOUS | Status: AC
  Administered 2020-07-17: 500 mL via INTRAVENOUS

## 2020-07-17 MED ORDER — MORPHINE SULFATE (PF) 4 MG/ML IV SOLN
4.0000 mg | Freq: Once | INTRAVENOUS | Status: AC
  Administered 2020-07-17: 4 mg via INTRAVENOUS
  Filled 2020-07-17: qty 1

## 2020-07-17 MED ORDER — POTASSIUM CHLORIDE CRYS ER 20 MEQ PO TBCR
40.0000 meq | EXTENDED_RELEASE_TABLET | Freq: Once | ORAL | Status: AC
  Administered 2020-07-17: 40 meq via ORAL
  Filled 2020-07-17: qty 2

## 2020-07-17 MED ORDER — POTASSIUM CHLORIDE 10 MEQ/100ML IV SOLN
10.0000 meq | INTRAVENOUS | Status: DC
  Administered 2020-07-17: 10 meq via INTRAVENOUS
  Filled 2020-07-17: qty 100

## 2020-07-17 MED ORDER — ONDANSETRON HCL 4 MG/2ML IJ SOLN
4.0000 mg | INTRAMUSCULAR | Status: AC
  Administered 2020-07-17: 4 mg via INTRAVENOUS
  Filled 2020-07-17: qty 2

## 2020-07-17 MED ORDER — IOHEXOL 300 MG/ML  SOLN
100.0000 mL | Freq: Once | INTRAMUSCULAR | Status: AC | PRN
  Administered 2020-07-17: 100 mL via INTRAVENOUS

## 2020-07-17 NOTE — ED Provider Notes (Signed)
[facility] Emergency Department Provider Note   ____________________________________________   Event Date/Time   First MD Initiated Contact with Patient 07/17/20 2114     (approximate)  I have reviewed the triage vital signs and the nursing notes.   HISTORY  Chief Complaint Abdominal Pain    HPI Margaret [name] is a 50 y.o. female the history of reported stroke paroxysmal A. fib on Eliquis  Patient reports that she is having pain for some time now and the site of the hernia repair above her bellybutton.  Yesterday it became increasingly painful while she was trying to work.  The pain increased.  She did vomit once.  Reports a moderate to severe pain just above her bellybutton and feels swelling and reports that her hernia is popping back out.  She saw her doctor just a couple weeks ago and reported the same to them, but they reported that they felt everything was okay by her exam.  She reports it seems to be getting worse.  No fevers or chills.  No chest pain.  No shortness of breath.  Denies pregnancy.  Denies other areas of abdominal pain the pain she reports is very focal just above the bellybutton at her repair site from her old hernia.     Past Medical History:  Diagnosis Date  . Anxiety   . Arthritis   . Asthma    controlled  . CVA (cerebral vascular accident) (HCC)    a. 09/2016  . Depression   . Dysrhythmia   . Headache   . Hypertension   . Hypothyroidism   . Morbid obesity (HCC)   . PAF (paroxysmal atrial fibrillation) (HCC) 09/2016   a. 30-day event monitor 6/18: predominant rhythm of sinus with isolated PACs and PVCs.  Two narrow complex tachycardia episodes concerning for A. Fib +/-atrial flutter or SVT.  No prolonged pauses; b. CHADS2VASc = 3 (stroke x 2, female)--> Eliquis.  Marland Kitchen PFO (patent foramen ovale)    a. TTE 6/18: EF of 60 to 65%, normal wall motion, grade 1 diastolic dysfunction; b. TEE 6/18: evidence of a very small atrial  level right to left shunt by bubble study without visualization of ASD or PFO  . Substance abuse (HCC)    Past use of cocaine   . Tobacco abuse     Patient Active Problem List   Diagnosis Date Noted  . Constipation 06/27/2020  . Fatigue 04/05/2020  . Encounter to establish care with new doctor 01/22/2020  . Encounter for hepatitis C screening test for low risk patient 01/22/2020  . Screening examination for sexually transmitted disease 01/22/2020  . Skin tag 01/22/2020  . Nonintractable headache 01/22/2020  . Status post laparoscopic cholecystectomy 09/28/2019  . Dyspnea on exertion 08/15/2018  . Multinodular goiter 04/28/2017  . Paroxysmal atrial fibrillation (HCC) 10/29/2016  . PFO (patent foramen ovale) 09/23/2016  . Hyperlipidemia 09/17/2016  . IFG (impaired fasting glucose) 09/17/2016  . History of stroke 09/06/2016  . Myopia 12/20/2014  . Abdominal pain 12/20/2014  . Depression 12/20/2014  . Right arm pain 11/21/2014  . 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 Thyroid activity decreased 10/18/2014  . Abnormal CBC 10/18/2014  . Tobacco abuse 10/18/2014  . Previous known suicide attempt 04/20/2014    Past Surgical History:  Procedure Laterality Date  . FRACTURE SURGERY    . TEE WITHOUT CARDIOVERSION N/A 09/08/2016   Procedure: TRANSESOPHAGEAL ECHOCARDIOGRAM (TEE);  Surgeon: [name], MD;  Location: [facility]  ORS;  Service: Cardiovascular;  Laterality: N/A;  . XI ROBOTIC ASSISTED VENTRAL HERNIA N/A 09/13/2019   Procedure: XI ROBOTIC ASSISTED EPIGATRIC HERNIA;  Surgeon: Campbell Lerner, MD;  Location: [facility] ORS;  Service: General;  Laterality: N/A;    Prior to Admission medications   Medication Sig Start Date End Date Taking? Authorizing Provider  apixaban (ELIQUIS) 5 MG TABS tablet Take 1 tablet (5 mg total) by mouth 2 (two) times daily. 12/12/19 06/09/20  End, Cristal Deer, MD  atorvastatin (LIPITOR) 80 MG tablet Take 80 mg by mouth  at bedtime. 05/08/20   [provider]  HYDROcodone-acetaminophen (NORCO/VICODIN) 5-325 MG tablet Take 1 tablet by mouth every 6 (six) hours as needed for moderate pain. 07/17/20   Sharyn Creamer, MD  levothyroxine (SYNTHROID) 100 MCG tablet Take 1 tablet (100 mcg total) by mouth daily. 03/28/20   Malfi, Jodelle Gross, FNP  levothyroxine (SYNTHROID) 125 MCG tablet Take 1 tablet by mouth once a day  1 by mouth daily for hypothyroid 05/08/20   [provider]  metoprolol tartrate (LOPRESSOR) 25 MG tablet Take 1 tablet (25 mg total) by mouth 2 (two) times daily. 05/08/20 05/08/21  Creig Hines, NP    Allergies Patient has no known allergies.  Family History  Problem Relation Age of Onset  . Stroke Father   . Hypertension Daughter   . Migraines Daughter   . Cervical cancer Maternal Grandmother   . Heart disease Maternal Grandfather   . Stroke Maternal Grandfather     Social History Social History   Tobacco Use  . Smoking status: Current Every Day Smoker    Packs/day: 0.25    Types: Cigarettes  . Smokeless tobacco: Former Neurosurgeon    Types: Snuff  . Tobacco comment: trying to quit; going to start using patches;   Vaping Use  . Vaping Use: Never used  Substance Use Topics  . Alcohol use: Not Currently    Alcohol/week: 0.0 standard drinks    Comment: on occasion  . Drug use: Not Currently    Types: Marijuana    Comment: Previously, but stopped years ago    Review of Systems Constitutional: No fever/chills Eyes: No visual changes.   Cardiovascular: Denies chest pain. Respiratory: Denies shortness of breath. Gastrointestinal: See HPI Genitourinary: Negative for dysuria. Musculoskeletal: Negative for back pain. Skin: Negative for rash. Neurological: Negative for headaches or weakness    ____________________________________________   PHYSICAL EXAM:  VITAL SIGNS: ED Triage Vitals  Enc Vitals Group     BP 07/17/20 2000 (!) 146/113     Pulse Rate 07/17/20  2000 84     Resp 07/17/20 2000 20     Temp 07/17/20 1958 98.1 F (36.7 C)     Temp Source 07/17/20 1958 Oral     SpO2 07/17/20 2000 99 %     Weight 07/17/20 1958 248 lb (112.5 kg)     Height 07/17/20 1958 5\' 5"  (1.651 m)     Head Circumference --      Peak Flow --      Pain Score 07/17/20 1958 10     Pain Loc --      Pain Edu? --      Excl. in GC? --     Constitutional: Alert and oriented. Well appearing and in no acute distress except she does appear in moderate pain, reporting pain of her upper epigastrium. Eyes: Conjunctivae are normal. Head: Atraumatic. Nose: No congestion/rhinnorhea. Mouth/Throat: Mucous membranes are moist. Neck: No stridor.  Cardiovascular: Normal  rate, regular rhythm. Grossly normal heart sounds.  Good peripheral circulation. Respiratory: Normal respiratory effort.  No retractions. Lungs CTAB. Gastrointestinal: Soft and nontender except she is markedly tender over the epigastrium where there is a palpable mass that feels soft and likely consistent with a ventral wall hernia.  There is no overlying erythema she is not actively vomiting. No distention.  Remaining areas of abdominal exam are soft nontender. Musculoskeletal: No lower extremity tenderness nor edema. Neurologic:  Normal speech and language. No gross focal neurologic deficits are appreciated.  Skin:  Skin is warm, dry and intact. No rash noted. Psychiatric: Mood and affect are normal. Speech and behavior are normal.  ____________________________________________   LABS (all labs ordered are listed, but only abnormal results are displayed)  Labs Reviewed  CBC WITH DIFFERENTIAL/PLATELET - Abnormal; Notable for the following components:      Result Value   WBC 13.8 (*)    Hemoglobin 11.3 (*)    HCT 34.4 (*)    Neutro Abs 8.1 (*)    Lymphs Abs 4.8 (*)    All other components within normal limits  BASIC METABOLIC PANEL - Abnormal; Notable for the following components:   Potassium 3.0 (*)     Glucose, Bld 124 (*)    Calcium 8.7 (*)    All other components within normal limits  URINALYSIS, ROUTINE W REFLEX MICROSCOPIC - Abnormal; Notable for the following components:   Color, Urine YELLOW (*)    APPearance CLEAR (*)    All other components within normal limits  RESP PANEL BY RT-PCR (FLU A&B, COVID) ARPGX2  LIPASE, BLOOD  PROTIME-INR  APTT  POC URINE PREG, ED  TROPONIN I (HIGH SENSITIVITY)   ____________________________________________  EKG   ____________________________________________  RADIOLOGY  DG Abdomen 1 View  Result Date: 07/17/2020 CLINICAL DATA:  Abdominal pain.  Episode of vomiting. EXAM: ABDOMEN - 1 VIEW COMPARISON:  CT 09/07/2019 FINDINGS: Divided supine views of the abdomen. No bowel dilatation to suggest obstruction. Moderate air and stool distension of the ascending and transverse colon. Small volume of stool in the descending colon. No abnormal rectal distention. Pelvic calcifications are phleboliths when compared with prior CT. No radiopaque calculi. Lung bases are clear. No acute osseous abnormalities are seen. IMPRESSION: Nonobstructive bowel gas pattern. Moderate air and stool distension of the ascending and transverse colon. Electronically Signed   By: Narda Rutherford M.D.   On: 07/17/2020 20:23   CT ABDOMEN PELVIS W CONTRAST  Result Date: 07/17/2020 CLINICAL DATA:  Epigastric pain EXAM: CT ABDOMEN AND PELVIS WITH CONTRAST TECHNIQUE: Multidetector CT imaging of the abdomen and pelvis was performed using the standard protocol following bolus administration of intravenous contrast. CONTRAST:  OMNIPAQUE IOHEXOL 300 MG/ML  SOLN COMPARISON:  CT 09/07/2019 FINDINGS: Lower chest: Lung bases demonstrate no acute consolidation or effusion. Normal cardiac size. Hepatobiliary: Gallbladder not identified and is presumed surgically absent. No focal hepatic abnormality. No biliary dilatation. Pancreas: Unremarkable. No pancreatic ductal dilatation or surrounding  inflammatory changes. Spleen: Normal in size without focal abnormality. Adrenals/Urinary Tract: Adrenal glands are unremarkable. Kidneys are normal, without renal calculi, focal lesion, or hydronephrosis. Bladder is unremarkable. Stomach/Bowel: Stomach is within normal limits. Appendix appears normal. No evidence of bowel wall thickening, distention, or inflammatory changes. Vascular/Lymphatic: Mild aortic atherosclerosis. No aneurysm. No suspicious nodes Reproductive: Uterus and bilateral adnexa are unremarkable. Other: Negative for free air or free fluid. Small moderate supraumbilical fat containing ventral hernia with soft tissue stranding and fluid in the hernia sac suspicious  for incarceration. Transverse fascial defect measures 2 cm. Musculoskeletal: No acute or significant osseous findings. IMPRESSION: 1. Small moderate supraumbilical fat containing ventral hernia. There is fluid and focal inflammatory process in the hernia sac suggesting incarcerated fat hernia. 2. Otherwise no CT evidence for acute intra-abdominal or pelvic abnormality Aortic Atherosclerosis (ICD10-I70.0). Electronically Signed   By: Jasmine PangKim  Fujinaga M.D.   On: 07/17/2020 22:48   CT scanner reviewed, small to moderate supraumbilical fat-containing ventral hernia.  There is some evidence of focal inflammatory process in the area.  Possible incarcerated fat hernia.  This was discussed with Dr. Everlene FarrierPabon ____________________________________________   PROCEDURES  Procedure(s) performed: None  Procedures  Critical Care performed: No  ____________________________________________   INITIAL IMPRESSION / ASSESSMENT AND PLAN / ED COURSE  Pertinent labs & imaging results that were available during my care of the patient were reviewed by me and considered in my medical decision making (see chart for details).   Differential diagnosis includes but is not limited to, abdominal perforation, aortic dissection, cholecystitis, appendicitis,  diverticulitis, colitis, esophagitis/gastritis, kidney stone, pyelonephritis, urinary tract infection, aortic aneurysm. All are considered in decision and treatment plan. Based upon the patient's presentation and risk factors, and based on her clinical exam my primary concern is that of a possible incarcerated or strangulated hernia.  After reviewing labs with mild leukocytosis, and CT scan it appears that the patient likely has a fat-containing incarcerated ventral hernia.  We will have her seen and evaluated by surgery tonight.  She does report pain is improved markedly after morphine and antiemetic.   Clinical Course as of 07/17/20 2348  Wed Jul 17, 2020  2316 Dr. Everlene FarrierPabon at bedside with patient, patient does tell me that she may have a childcare issue and wants to consider an option if she might be able to go home to follow-up closely potentially tomorrow with surgery.  Dr. Everlene FarrierPabon discussing options and recommendations with the patient.  She does report her pain is better, she is resting comfortably at this time no further vomiting [MQ]    Clinical Course User Index [MQ] Sharyn CreamerQuale, Lechelle Wrigley, MD   Though the patient was clearly offered admission to the hospital and surgical management, she is also anticoagulated.  I discussed the case with the patient and also Dr. Sterling Bigiego Pabon who saw the patient in the ER, and due to a childcare issue with her young daughter and no one else to care for her she would like to be able to be discharged today, get care for her child, and will be seen tomorrow for urgent clinic follow-up with Dr. Dolores Frameotenberg.  Would like this, and reports she cannot stay in the hospital this evening for this issue.  I think this is acceptable, general surgery has seen her and also agrees that she does not need emergent surgery tonight.  Will discharge the patient, provided brief short prescription for hydrocodone.  No driving today or while taking this medication.  She will be following up closely  with Dr. Holly Bodilyotenberg's clinic tomorrow  Return precautions and treatment recommendations and follow-up discussed with the patient who is agreeable with the plan.  She is well aware of plan and agrees to follow-up with general surgery tomorrow   ____________________________________________   FINAL CLINICAL IMPRESSION(S) / ED DIAGNOSES  Final diagnoses:  Ventral hernia without obstruction or gangrene        Note:  This document was prepared using Dragon voice recognition software and may include unintentional dictation errors     -----------------------------------------  9:00 AM on 07/18/2020 -----------------------------------------  Message sent to Dr. Claudine Mouton at this time requesting that his clinic assure follow-up later today    Sharyn Creamer, MD 07/18/20 [phone]

## 2020-07-17 NOTE — ED Triage Notes (Signed)
Pt states she started having LUQ abd pain earlier today, pt states she has vomited once today. Pt denies diarrhea, states she hasn't had a good bowel movement in a few days.

## 2020-07-17 NOTE — Discharge Instructions (Addendum)
STOP Eliquis as you need upcoming surgery. Follow-up TOMORROW with Dr. Claudine Mouton (as planned with Dr. Loraine Grip)  Please return to the emergency room right away if you are to develop a fever, severe nausea, your pain becomes severe or worsens, you are unable to keep food down, begin vomiting any dark or bloody fluid, you develop any dark or bloody stools, feel dehydrated, or other new concerns or symptoms arise.  No driving tonight or while taking hydrocodone. Use only as prescribed.

## 2020-07-17 NOTE — Consult Note (Addendum)
Patient ID: Margaret [name], female   DOB: Mar 26, 1971, 50 y.o.   MRN: [id number]  HPI TANEKA [name] is a 50 y.o. female pain interpretation at the request of Dr. Fanny Bien for abdominal pain ( d/w him in detail)_.  She reports that the pain started today.  It is any above the umbilicus moderate to severe intensity and constant.  The pain seems to be aggravated by movement.  She did have some nausea and some vomiting.  Now her appetite is back.  No fevers no chills.  She did have a laparoscopic cholecystectomy with repair of epigastric hernia by Dr. Claudine Mouton on June 2021. Also reports a bulge.  She does have a history of A. fib and a history of a stroke and is currently anticoagulated on Eliquis. He is able to perform more than 4 METS of activity without any shortness of breath or chest pain.  She did have a CT scan that I personally reviewed showing evidence of a recurrent ventral epigastric hernia with evidence of fat incarceration no evidence of bowel incarceration.  BMP is normal other than hypokalemia.  CBC shows mild anemia of 11.3 and white count of 13.8.  Normal platelets INR pending  HPI  Past Medical History:  Diagnosis Date  . Anxiety   . Arthritis   . Asthma    controlled  . CVA (cerebral vascular accident) (HCC)    a. 09/2016  . Depression   . Dysrhythmia   . Headache   . Hypertension   . Hypothyroidism   . Morbid obesity (HCC)   . PAF (paroxysmal atrial fibrillation) (HCC) 09/2016   a. 30-day event monitor 6/18: predominant rhythm of sinus with isolated PACs and PVCs.  Two narrow complex tachycardia episodes concerning for A. Fib +/-atrial flutter or SVT.  No prolonged pauses; b. CHADS2VASc = 3 (stroke x 2, female)--> Eliquis.  Marland Kitchen PFO (patent foramen ovale)    a. TTE 6/18: EF of 60 to 65%, normal wall motion, grade 1 diastolic dysfunction; b. TEE 6/18: evidence of a very small atrial level right to left shunt by bubble study without visualization of ASD or PFO  . Substance abuse (HCC)     Past use of cocaine   . Tobacco abuse     Past Surgical History:  Procedure Laterality Date  . FRACTURE SURGERY    . TEE WITHOUT CARDIOVERSION N/A 09/08/2016   Procedure: TRANSESOPHAGEAL ECHOCARDIOGRAM (TEE);  Surgeon: [name], MD;  Location: [facility] ORS;  Service: Cardiovascular;  Laterality: N/A;  . XI ROBOTIC ASSISTED VENTRAL HERNIA N/A 09/13/2019   Procedure: XI ROBOTIC ASSISTED EPIGATRIC HERNIA;  Surgeon: Campbell Lerner, MD;  Location: [facility] ORS;  Service: General;  Laterality: N/A;    Family History  Problem Relation Age of Onset  . Stroke Father   . Hypertension Daughter   . Migraines Daughter   . Cervical cancer Maternal Grandmother   . Heart disease Maternal Grandfather   . Stroke Maternal Grandfather     Social History Social History   Tobacco Use  . Smoking status: Current Every Day Smoker    Packs/day: 0.25    Types: Cigarettes  . Smokeless tobacco: Former Neurosurgeon    Types: Snuff  . Tobacco comment: trying to quit; going to start using patches;   Vaping Use  . Vaping Use: Never used  Substance Use Topics  . Alcohol use: Not Currently    Alcohol/week: 0.0 standard drinks    Comment: on occasion  . Drug use: Not Currently  Types: Marijuana    Comment: Previously, but stopped years ago    No Known Allergies  Current Facility-Administered Medications  Medication Dose Route Frequency Provider Last Rate Last Admin  . potassium chloride 10 mEq in 100 mL IVPB  10 mEq Intravenous Q1 Hr x 2 Sharyn Creamer, MD 66.7 mL/hr at 07/17/20 2326 10 mEq at 07/17/20 2326   Current Outpatient Medications  Medication Sig Dispense Refill  . apixaban (ELIQUIS) 5 MG TABS tablet Take 1 tablet (5 mg total) by mouth 2 (two) times daily. 180 tablet 1  . atorvastatin (LIPITOR) 80 MG tablet Take 80 mg by mouth at bedtime.    Marland Kitchen levothyroxine (SYNTHROID) 100 MCG tablet Take 1 tablet (100 mcg total) by mouth daily. 30 tablet 0  . levothyroxine (SYNTHROID) 125 MCG tablet Take 1  tablet by mouth once a day  1 by mouth daily for hypothyroid    . metoprolol tartrate (LOPRESSOR) 25 MG tablet Take 1 tablet (25 mg total) by mouth 2 (two) times daily. 180 tablet 1     Review of Systems Full ROS  was asked and was negative except for the information on the HPI  Physical Exam Blood pressure 135/78, pulse 80, temperature 98.1 F (36.7 C), temperature source Oral, resp. rate 18, height 5\' 5"  (1.651 m), weight 112.5 kg, last menstrual period 05/07/2016, SpO2 100 %. CONSTITUTIONAL: NAD EYES: Pupils are equal, round, , Sclera are non-icteric. EARS, NOSE, MOUTH AND THROAT: She is wearing a mask. Hearing is intact to voice. LYMPH NODES:  Lymph nodes in the neck are normal. RESPIRATORY:  Lungs are clear. There is normal respiratory effort, with equal breath sounds bilaterally, and without pathologic use of accessory muscles. CARDIOVASCULAR: Heart is regular without murmurs, gallops, or rubs. GI: The abdomen is soft, . There are normal bowel sounds.  There is an evidence of an incarcerated epigastric incisional hernia very tender to palpation but without any peritonitis.  Please note that the tenderness is very focal. GU: Rectal deferred.   MUSCULOSKELETAL: Normal muscle strength and tone. No cyanosis or edema.   SKIN: Turgor is good and there are no pathologic skin lesions or ulcers. NEUROLOGIC: Motor and sensation is grossly normal. Cranial nerves are grossly intact. PSYCH:  Oriented to person, place and time. Affect is normal.  Data Reviewed  I have personally reviewed the patient's imaging, laboratory findings and medical records.    Assessment/Plan 50 year old female with incarcerated recurrent incisional hernia with properitoneal fat.  There is no evidence of bowel incarceration.  Discussed with the patient detail about the situation.  She is fully anticoagulated.  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  Currently she is not peritonitic nor septic. The   Patient wants to go home and see Dr. 52 tomorrow afternoon.  I do think that she will need repair of this incisional hernia in a prompt fashion given her symptoms. I did offer her admission to the hospital and surgical repair in am. She wishes to wait for now and I Do think that she can wait until Friday for Dr. Saturday to do a definitive repair.  She understands about the potential risk of doing this as an outpatient.  I do think that this is a reasonable plan.  We will defer to Dr.Rodenberg so he can see her tomorrow afternoon. No Need for emergent surgical invention at this time. She will hold eliquis  Claudine Mouton, MD FACS General Surgeon 07/17/2020, 11:27 PM

## 2020-07-17 NOTE — ED Notes (Signed)
Pt requested ice. MD notified. MD states Pt is NPO. Pt notified.

## 2020-07-18 ENCOUNTER — Telehealth: Payer: Self-pay | Admitting: Surgery

## 2020-07-18 ENCOUNTER — Encounter: Payer: Self-pay | Admitting: Surgery

## 2020-07-18 ENCOUNTER — Ambulatory Visit (INDEPENDENT_AMBULATORY_CARE_PROVIDER_SITE_OTHER): Payer: Medicaid Other | Admitting: Surgery

## 2020-07-18 ENCOUNTER — Ambulatory Visit: Payer: Self-pay | Admitting: Surgery

## 2020-07-18 ENCOUNTER — Other Ambulatory Visit: Payer: Self-pay

## 2020-07-18 VITALS — BP 127/87 | HR 75 | Temp 98.1°F | Ht 65.0 in | Wt 246.0 lb

## 2020-07-18 DIAGNOSIS — K430 Incisional hernia with obstruction, without gangrene: Secondary | ICD-10-CM

## 2020-07-18 DIAGNOSIS — K436 Other and unspecified ventral hernia with obstruction, without gangrene: Secondary | ICD-10-CM

## 2020-07-18 LAB — RESP PANEL BY RT-PCR (FLU A&B, COVID) ARPGX2
Influenza A by PCR: NEGATIVE
Influenza B by PCR: NEGATIVE
SARS Coronavirus 2 by RT PCR: NEGATIVE

## 2020-07-18 NOTE — Telephone Encounter (Signed)
Patient has been advised of Pre-Admission date/time, COVID Testing date and Surgery date.  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  She does have a copy of her vaccine record and will either fax or hand deliver by Monday 07/22/20, then will fax to pre-admit.    Patient has been made aware to call [phone], between 1-3:00pm the day before surgery, to find out what time to arrive for surgery.

## 2020-07-18 NOTE — Patient Instructions (Signed)
Our surgery scheduler will contact you in the next 24-48 hours. When speaking with her please have your blue surgery sheet available.   Please give [place] a call if you have any questions or concerns.

## 2020-07-18 NOTE — Progress Notes (Addendum)
Patient ID: Margaret [name], female   DOB: 10/18/1970, 50 y.o.   MRN: [id number]  Chief Complaint: Vomiting and abdominal pain yesterday  History of Present Illness Margaret [name] is a 50 y.o. female after work had some disagreement, got suddenly more uncomfortable with abdominal pain, and nausea with vomiting followed.  She went to the ED for the pain, a CT scan was done and she was told that she may need emergent surgery.  Due to childcare issues/social issues she elected not to be admitted to undergo surgery and was instructed to follow-up with me today.  She has not eaten today, and reports been slightly constipated prior to visiting the ER. Today she is hungry and would like to eat.  Her pain is limited to the site of the hernia just above the navel.  This was primarily repaired during her cholecystectomy surgery.  She has not had a mesh hernia repair.  Past Medical History Past Medical History:  Diagnosis Date  . Anxiety   . Arthritis   . Asthma    controlled  . CVA (cerebral vascular accident) (HCC)    a. 09/2016  . Depression   . Dysrhythmia   . Headache   . Hypertension   . Hypothyroidism   . Morbid obesity (HCC)   . PAF (paroxysmal atrial fibrillation) (HCC) 09/2016   a. 30-day event monitor 6/18: predominant rhythm of sinus with isolated PACs and PVCs.  Two narrow complex tachycardia episodes concerning for A. Fib +/-atrial flutter or SVT.  No prolonged pauses; b. CHADS2VASc = 3 (stroke x 2, female)--> Eliquis.  Marland Kitchen PFO (patent foramen ovale)    a. TTE 6/18: EF of 60 to 65%, normal wall motion, grade 1 diastolic dysfunction; b. TEE 6/18: evidence of a very small atrial level right to left shunt by bubble study without visualization of ASD or PFO  . Substance abuse (HCC)    Past use of cocaine   . Tobacco abuse       Past Surgical History:  Procedure Laterality Date  . FRACTURE SURGERY    . TEE WITHOUT CARDIOVERSION N/A 09/08/2016   Procedure: TRANSESOPHAGEAL ECHOCARDIOGRAM  (TEE);  Surgeon: [name], MD;  Location: [facility] ORS;  Service: Cardiovascular;  Laterality: N/A;  . XI ROBOTIC ASSISTED VENTRAL HERNIA N/A 09/13/2019   Procedure: XI ROBOTIC Cholecystectomy, with primary non-mesh repair of EPIGATRIC HERNIA;  Surgeon: Campbell Lerner, MD;  Location: [facility] ORS;  Service: General;  Laterality: N/A;    No Known Allergies  Current Outpatient Medications  Medication Sig Dispense Refill  . atorvastatin (LIPITOR) 80 MG tablet Take 80 mg by mouth at bedtime.    Marland Kitchen HYDROcodone-acetaminophen (NORCO/VICODIN) 5-325 MG tablet Take 1 tablet by mouth every 6 (six) hours as needed for moderate pain. 8 tablet 0  . levothyroxine (SYNTHROID) 100 MCG tablet Take 1 tablet (100 mcg total) by mouth daily. 30 tablet 0  . levothyroxine (SYNTHROID) 125 MCG tablet Take 1 tablet by mouth once a day  1 by mouth daily for hypothyroid    . metoprolol tartrate (LOPRESSOR) 25 MG tablet Take 1 tablet (25 mg total) by mouth 2 (two) times daily. 180 tablet 1  . apixaban (ELIQUIS) 5 MG TABS tablet Take 1 tablet (5 mg total) by mouth 2 (two) times daily. 180 tablet 1   No current facility-administered medications for this visit.    Family History Family History  Problem Relation Age of Onset  . Stroke Father   . Hypertension Daughter   .  Migraines Daughter   . Cervical cancer Maternal Grandmother   . Heart disease Maternal Grandfather   . Stroke Maternal Grandfather       Social History Social History   Tobacco Use  . Smoking status: Current Every Day Smoker    Packs/day: 0.25    Types: Cigarettes  . Smokeless tobacco: Former Neurosurgeon    Types: Snuff  . Tobacco comment: trying to quit; going to start using patches;   Vaping Use  . Vaping Use: Never used  Substance Use Topics  . Alcohol use: Not Currently    Alcohol/week: 0.0 standard drinks    Comment: on occasion  . Drug use: Not Currently    Types: Marijuana    Comment: Previously, but stopped years ago         Review of Systems  All other systems reviewed and are negative.    Physical Exam Blood pressure 127/87, pulse 75, temperature 98.1 F (36.7 C), temperature source Oral, height 5\' 5"  (1.651 m), weight 246 lb (111.6 kg), last menstrual period 05/07/2016, SpO2 98 %. Last Weight  Most recent update: 07/18/2020  2:46 PM   Weight  111.6 kg (246 lb)            CONSTITUTIONAL: Well developed, morbidly obese and well nourished, appropriately responsive and aware without distress.   EYES: Sclera non-icteric.   EARS, NOSE, MOUTH AND THROAT: Mask worn.   Hearing is intact to voice.  NECK: Trachea is midline, and there is no jugular venous distension.  LYMPH NODES:  Lymph nodes in the neck are not enlarged. RESPIRATORY:  Lungs are clear, and breath sounds are equal bilaterally. Normal respiratory effort without pathologic use of accessory muscles. CARDIOVASCULAR: Heart is regular in rate and rhythm. GI: The abdomen is obese with supraumbilical mass that is mildly tender with radiation to the left and right from this area.  Otherwise soft, nontender, and nondistended. There were no other palpable masses. I did not appreciate hepatosplenomegaly. There were normal bowel sounds. GU:  MUSCULOSKELETAL:  Symmetrical muscle tone appreciated in all four extremities.    SKIN: Skin turgor is normal. No pathologic skin lesions appreciated.  NEUROLOGIC:  Motor and sensation appear grossly normal.  Cranial nerves are grossly without defect. PSYCH:  Alert and oriented to person, place and time. Affect is appropriate for situation.  Data Reviewed I have personally reviewed what is currently available of the patient's imaging, recent labs and medical records.   Labs:  CBC Latest Ref Rng & Units 07/17/2020 04/03/2020 01/22/2020  WBC 4.0 - 10.5 K/uL 13.8(H) 11.5(H) 10.8  Hemoglobin 12.0 - 15.0 g/dL 11.3(L) 12.5 12.2  Hematocrit 36.0 - 46.0 % 34.4(L) 38.2 38.0  Platelets 150 - 400 K/uL 347 391 405(H)   CMP  Latest Ref Rng & Units 07/17/2020 04/03/2020 01/22/2020  Glucose 70 - 99 mg/dL 01/24/2020) 98 503(U)  BUN 6 - 20 mg/dL 8 7 9   Creatinine 0.44 - 1.00 mg/dL 882(C 0.03  Sodium 135 - 145 mmol/L 141 143 140  Potassium 3.5 - 5.1 mmol/L 3.0(L) 4.1 4.0  Chloride 98 - 111 mmol/L 106 103 105  CO2 22 - 32 mmol/L 27 27 28   Calcium 8.9 - 10.3 mg/dL 4.91) 9.5 9.5  Total Protein 6.0 - 8.5 g/dL - 6.9 6.8  Total Bilirubin 0.0 - 1.2 mg/dL - 0.3 0.4  Alkaline Phos 44 - 121 IU/L - 119 -  AST 0 - 40 IU/L - 21 18  ALT 0 - 32 IU/L - 21  16      Imaging: Radiology review: Images personally reviewed.  I concur there is no evidence of bowel involvement in the incarcerated hernia which is primarily fat/likely omental. Within last 24 hrs: DG Abdomen 1 View  Result Date: 07/17/2020 CLINICAL DATA:  Abdominal pain.  Episode of vomiting. EXAM: ABDOMEN - 1 VIEW COMPARISON:  CT 09/07/2019 FINDINGS: Divided supine views of the abdomen. No bowel dilatation to suggest obstruction. Moderate air and stool distension of the ascending and transverse colon. Small volume of stool in the descending colon. No abnormal rectal distention. Pelvic calcifications are phleboliths when compared with prior CT. No radiopaque calculi. Lung bases are clear. No acute osseous abnormalities are seen. IMPRESSION: Nonobstructive bowel gas pattern. Moderate air and stool distension of the ascending and transverse colon. Electronically Signed   By: Narda RutherfordMelanie  Sanford M.D.   On: 07/17/2020 20:23   CT ABDOMEN PELVIS W CONTRAST  Result Date: 07/17/2020 CLINICAL DATA:  Epigastric pain EXAM: CT ABDOMEN AND PELVIS WITH CONTRAST TECHNIQUE: Multidetector CT imaging of the abdomen and pelvis was performed using the standard protocol following bolus administration of intravenous contrast. CONTRAST:  100mL OMNIPAQUE IOHEXOL 300 MG/ML  SOLN COMPARISON:  CT 09/07/2019 FINDINGS: Lower chest: Lung bases demonstrate no acute consolidation or effusion. Normal cardiac  size. Hepatobiliary: Gallbladder not identified and is presumed surgically absent. No focal hepatic abnormality. No biliary dilatation. Pancreas: Unremarkable. No pancreatic ductal dilatation or surrounding inflammatory changes. Spleen: Normal in size without focal abnormality. Adrenals/Urinary Tract: Adrenal glands are unremarkable. Kidneys are normal, without renal calculi, focal lesion, or hydronephrosis. Bladder is unremarkable. Stomach/Bowel: Stomach is within normal limits. Appendix appears normal. No evidence of bowel wall thickening, distention, or inflammatory changes. Vascular/Lymphatic: Mild aortic atherosclerosis. No aneurysm. No suspicious nodes Reproductive: Uterus and bilateral adnexa are unremarkable. Other: Negative for free air or free fluid. Small moderate supraumbilical fat containing ventral hernia with soft tissue stranding and fluid in the hernia sac suspicious for incarceration. Transverse fascial defect measures 2 cm. Musculoskeletal: No acute or significant osseous findings. IMPRESSION: 1. Small moderate supraumbilical fat containing ventral hernia. There is fluid and focal inflammatory process in the hernia sac suggesting incarcerated fat hernia. 2. Otherwise no CT evidence for acute intra-abdominal or pelvic abnormality Aortic Atherosclerosis (ICD10-I70.0). Electronically Signed   By: Jasmine PangKim  Fujinaga M.D.   On: 07/17/2020 22:48    Assessment    Incarcerated, recurrent ventral hernia.  No evidence of bowel impairment on CT scan examination or clinical. Patient Active Problem List   Diagnosis Date Noted  . Constipation 06/27/2020  . Fatigue 04/05/2020  . Encounter to establish care with new doctor 01/22/2020  . Encounter for hepatitis C screening test for low risk patient 01/22/2020  . Screening examination for sexually transmitted disease 01/22/2020  . Skin tag 01/22/2020  . Nonintractable headache 01/22/2020  . Status post laparoscopic cholecystectomy 09/28/2019  . Dyspnea  on exertion 08/15/2018  . Multinodular goiter 04/28/2017  . Paroxysmal atrial fibrillation (HCC) 10/29/2016  . PFO (patent foramen ovale) 09/23/2016  . Hyperlipidemia 09/17/2016  . IFG (impaired fasting glucose) 09/17/2016  . History of stroke 09/06/2016  . Myopia 12/20/2014  . Abdominal pain 12/20/2014  . Depression 12/20/2014  . Right arm pain 11/21/2014  . 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 Thyroid activity decreased 10/18/2014  . Abnormal CBC 10/18/2014  . Tobacco abuse 10/18/2014  . Previous known suicide attempt 04/20/2014    Plan    We discussed the timing of surgery, being  more elective than was previously described to her.  She seemed quite relieved and glad that she could eat today.  We discussed future timing/scheduling of surgery.   We discussed robotic recurrent ventral hernia repair with mesh.  We discussed mesh in detail and the need to utilize it in lieu of the fact that this fascial defect had been previously primarily repaired.  We discussed weight loss and the opportunity to defer elective repair. I discussed possibility of incarceration, strangulation, enlargement in size over time, and the need for emergency surgery in the face of these.  Also reviewed the techniques of reduction should incarceration occur, and when unsuccessful to present to the ED.  Also discussed that surgery risks include recurrence which can be up to 30% in the case of complex hernias, use of prosthetic materials (mesh) and the increased risk of infection and the possible need for re-operation and removal of mesh, possibility of post-op SBO or ileus, and the risks of general anesthetic including heart attack, stroke, sudden death or some reaction to anesthetic medications. The patient, and those present, appear to understand the risks, any and all questions were answered to the patient's satisfaction.  No guarantees were ever expressed or  implied. Face-to-face time spent with the patient and accompanying care providers(if present) was 30 minutes, with more than 50% of the time spent counseling, educating, and coordinating care of the patient.     Follow-up telephone conversation on July 19, 2020. She is taking her Eliquis, I asked her to hold it 2 days prior to surgery.  She continues to smoke and I have asked her also that smoking cessation will enable her to have the best both postoperative outcome as feasible.  She is 12 days out from her anticipated surgery date considering the urgency of her discomfort and the incarcerated fatty tissue.  She was reluctant, but I asked her if she would please do her best.     Campbell Lerner M.D., [facility] 07/18/2020, 4:44 PM

## 2020-07-25 ENCOUNTER — Other Ambulatory Visit: Payer: Medicaid Other

## 2020-07-25 ENCOUNTER — Telehealth: Payer: Self-pay | Admitting: Surgery

## 2020-07-25 NOTE — Telephone Encounter (Signed)
Upon calling patient again to give her information regarding rescheduled surgery again at her request, she now decides not to do surgery at all.  She said there is some personal stuff going on and chooses not to do surgery.  She is informed that if she decides to proceed she will need to call our office and schedule appointment with the doctor first.

## 2020-07-29 ENCOUNTER — Other Ambulatory Visit: Payer: Medicaid Other

## 2020-08-05 ENCOUNTER — Other Ambulatory Visit: Payer: Medicaid Other

## 2020-08-12 ENCOUNTER — Other Ambulatory Visit: Payer: Medicaid Other

## 2020-08-14 ENCOUNTER — Ambulatory Visit: Admit: 2020-08-14 | Payer: Medicaid Other | Admitting: Surgery

## 2020-08-14 SURGERY — REPAIR, HERNIA, VENTRAL, ROBOT-ASSISTED
Anesthesia: General

## 2020-08-24 ENCOUNTER — Emergency Department
Admission: EM | Admit: 2020-08-24 | Discharge: 2020-08-24 | Disposition: A | Discharge status: Home / Self Care | Payer: Medicaid Other | Attending: Emergency Medicine | Admitting: Emergency Medicine

## 2020-08-24 ENCOUNTER — Other Ambulatory Visit: Payer: Self-pay

## 2020-08-24 ENCOUNTER — Encounter: Payer: Self-pay | Admitting: Emergency Medicine

## 2020-08-24 DIAGNOSIS — Z5321 Procedure and treatment not carried out due to patient leaving prior to being seen by health care provider: Secondary | ICD-10-CM | POA: Diagnosis not present

## 2020-08-24 DIAGNOSIS — R531 Weakness: Secondary | ICD-10-CM | POA: Insufficient documentation

## 2020-08-24 DIAGNOSIS — M25511 Pain in right shoulder: Secondary | ICD-10-CM | POA: Diagnosis not present

## 2020-08-24 DIAGNOSIS — R197 Diarrhea, unspecified: Secondary | ICD-10-CM | POA: Diagnosis not present

## 2020-08-24 DIAGNOSIS — R519 Headache, unspecified: Secondary | ICD-10-CM | POA: Insufficient documentation

## 2020-08-24 LAB — URINALYSIS, COMPLETE (UACMP) WITH MICROSCOPIC
Bacteria, UA: NONE SEEN
Bilirubin Urine: NEGATIVE
Glucose, UA: NEGATIVE mg/dL
Hgb urine dipstick: NEGATIVE
Ketones, ur: 5 mg/dL — AB
Leukocytes,Ua: NEGATIVE
Nitrite: NEGATIVE
Protein, ur: NEGATIVE mg/dL
Specific Gravity, Urine: 1.026 (ref 1.005–1.030)
pH: 5 (ref 5.0–8.0)

## 2020-08-24 LAB — CBC
HCT: 35.9 % — ABNORMAL LOW (ref 36.0–46.0)
Hemoglobin: 11.7 g/dL — ABNORMAL LOW (ref 12.0–15.0)
MCH: 27 pg (ref 26.0–34.0)
MCHC: 32.6 g/dL (ref 30.0–36.0)
MCV: 82.7 fL (ref 80.0–100.0)
Platelets: 350 10*3/uL (ref 150–400)
RBC: 4.34 MIL/uL (ref 3.87–5.11)
RDW: 14.6 % (ref 11.5–15.5)
WBC: 11.9 10*3/uL — ABNORMAL HIGH (ref 4.0–10.5)
nRBC: 0 % (ref 0.0–0.2)

## 2020-08-24 LAB — BASIC METABOLIC PANEL
Anion gap: 9 (ref 5–15)
BUN: 8 mg/dL (ref 6–20)
CO2: 24 mmol/L (ref 22–32)
Calcium: 9 mg/dL (ref 8.9–10.3)
Chloride: 106 mmol/L (ref 98–111)
Creatinine, Ser: 0.65 mg/dL (ref 0.44–1.00)
GFR, Estimated: >60 mL/min (ref >60)
Glucose, Bld: 136 mg/dL — ABNORMAL HIGH (ref 70–99)
Potassium: 3.1 mmol/L — ABNORMAL LOW (ref 3.5–5.1)
Sodium: 139 mmol/L (ref 135–145)

## 2020-08-24 LAB — POC URINE PREG, ED: Preg Test, Ur: NEGATIVE

## 2020-08-24 NOTE — ED Triage Notes (Signed)
Pt presents via pov with c/o weakness. Pt reports today while at work she suddenly became very sweaty weak. Pt reports episode of diarrhea as well. Pt also c/o right scapula pain that patient states "feels like its hooking". Pt denies any known recent injuries. Pt currently alert and oriented x4.

## 2020-08-24 NOTE — ED Notes (Addendum)
Pt requesting a note that says she was here in the lobby waiting, advised patient that we do not give her work notes if she was not seen. Pt upset making comments about leaving and going to urgent care in the morning. Pt ambulatory to front door without issue.

## 2020-08-24 NOTE — ED Notes (Signed)
Pt also reports recent increase in using the bathroom at night. Denies hx of diabetes. No burning with urination or other urinary symptoms.

## 2020-08-24 NOTE — ED Notes (Signed)
Per security, pt was observed walking out of ED

## 2020-09-24 ENCOUNTER — Encounter: Payer: Self-pay | Admitting: Podiatry

## 2020-09-24 ENCOUNTER — Encounter: Payer: Medicaid Other | Admitting: Podiatry

## 2020-09-24 ENCOUNTER — Other Ambulatory Visit: Payer: Self-pay

## 2020-09-24 MED ORDER — METOPROLOL TARTRATE 25 MG PO TABS: 25.0000 mg | ORAL_TABLET | Freq: Two times a day (BID) | ORAL | 0 refills | Status: DC

## 2020-09-24 MED ORDER — ATORVASTATIN CALCIUM 80 MG PO TABS: 80.0000 mg | ORAL_TABLET | Freq: Every day | ORAL | 0 refills | Status: DC

## 2020-09-25 NOTE — Progress Notes (Signed)
This encounter was created in error - please disregard.

## 2020-10-08 ENCOUNTER — Encounter: Payer: Medicaid Other | Admitting: Podiatry

## 2020-10-24 ENCOUNTER — Encounter: Payer: Self-pay | Admitting: Internal Medicine

## 2020-10-24 ENCOUNTER — Ambulatory Visit (INDEPENDENT_AMBULATORY_CARE_PROVIDER_SITE_OTHER): Payer: Medicaid Other | Admitting: Internal Medicine

## 2020-10-24 ENCOUNTER — Other Ambulatory Visit: Payer: Self-pay

## 2020-10-24 VITALS — BP 120/80 | HR 78 | Ht 65.0 in | Wt 244.0 lb

## 2020-10-24 DIAGNOSIS — R531 Weakness: Secondary | ICD-10-CM | POA: Diagnosis not present

## 2020-10-24 DIAGNOSIS — R519 Headache, unspecified: Secondary | ICD-10-CM

## 2020-10-24 DIAGNOSIS — Q2112 Patent foramen ovale: Secondary | ICD-10-CM

## 2020-10-24 DIAGNOSIS — I10 Essential (primary) hypertension: Secondary | ICD-10-CM | POA: Insufficient documentation

## 2020-10-24 DIAGNOSIS — Z8673 Personal history of transient ischemic attack (TIA), and cerebral infarction without residual deficits: Secondary | ICD-10-CM

## 2020-10-24 DIAGNOSIS — Q211 Atrial septal defect: Secondary | ICD-10-CM

## 2020-10-24 DIAGNOSIS — I480 Paroxysmal atrial fibrillation: Secondary | ICD-10-CM

## 2020-10-24 NOTE — Progress Notes (Signed)
Follow-up Outpatient Visit Date: 10/24/2020  Primary Care Provider: [name], FNP No address on file  Chief Complaint: Headache and right shoulder/arm pain/numbness  HPI:  Ms. Coyle is a 50 y.o. female with history of paroxysmal atrial fibrillation complicated by stroke, patent foramen ovale, hypothyroidism, asthma, COVID-19 (05/2019), and remote polysubstance abuse, who presents for follow-up of paroxysmal atrial fibrillation and prior stroke.  Today, Ms. Madilyn Fireman is concerned about headaches and right shoulder/arm pain and weakness as well as occasional numbness that has been present over the last 2 weeks.  This coincides with her being unable to take some of her medications including apixaban.  Symptoms are reminiscent albeit less severe than what she experienced with her prior stroke.  Symptoms come and go throughout the day.  She otherwise denies focal neurologic deficits.  She has not had any falls or trauma.  She has not had any chest pain or palpitations.  She reports feeling a little lightheaded at times.  She also has mild swelling of her right ankle, which is chronic, as well as intermittent shortness of breath that can occur with exertion or at rest.  --------------------------------------------------------------------------------------------------  Past Medical History:  Diagnosis Date   Anxiety    Arthritis    Asthma    controlled   CVA (cerebral vascular accident) (HCC)    a. 09/2016   Depression    Dysrhythmia    Headache    Hypertension    Hypothyroidism    Morbid obesity (HCC)    PAF (paroxysmal atrial fibrillation) (HCC) 09/2016   a. 30-day event monitor 6/18: predominant rhythm of sinus with isolated PACs and PVCs.  Two narrow complex tachycardia episodes concerning for A. Fib +/-atrial flutter or SVT.  No prolonged pauses; b. CHADS2VASc = 3 (stroke x 2, female)--> Eliquis.   PFO (patent foramen ovale)    a. TTE 6/18: EF of 60 to 65%, normal wall motion, grade 1  diastolic dysfunction; b. TEE 6/18: evidence of a very small atrial level right to left shunt by bubble study without visualization of ASD or PFO   Substance abuse (HCC)    Past use of cocaine    Tobacco abuse    Past Surgical History:  Procedure Laterality Date   FRACTURE SURGERY     TEE WITHOUT CARDIOVERSION N/A 09/08/2016   Procedure: TRANSESOPHAGEAL ECHOCARDIOGRAM (TEE);  Surgeon: [name], MD;  Location: [facility] ORS;  Service: Cardiovascular;  Laterality: N/A;   XI ROBOTIC ASSISTED VENTRAL HERNIA N/A 09/13/2019   Procedure: XI ROBOTIC Cholecystectomy, with primary non-mesh repair of EPIGATRIC HERNIA;  Surgeon: Campbell Lerner, MD;  Location: [facility] ORS;  Service: General;  Laterality: N/A;    Current Meds  Medication Sig   apixaban (ELIQUIS) 5 MG TABS tablet Take 1 tablet (5 mg total) by mouth 2 (two) times daily.   atorvastatin (LIPITOR) 80 MG tablet Take 1 tablet (80 mg total) by mouth at bedtime. Please schedule office visit for further refills. Thank you!   Cyanocobalamin (B-12 PO) Take 1 tablet by mouth daily.   levothyroxine (SYNTHROID) 125 MCG tablet Take 1 tablet by mouth once a day  1 by mouth daily for hypothyroid   metoprolol tartrate (LOPRESSOR) 25 MG tablet Take 1 tablet (25 mg total) by mouth 2 (two) times daily. Please schedule office visit for further refills. Thank you!    Allergies: Patient has no known allergies.  Social History   Tobacco Use   Smoking status: Every Day    Packs/day: 0.25  Types: Cigarettes   Smokeless tobacco: Former    Types: Snuff   Tobacco comments:    trying to quit; going to start using patches;   Vaping Use   Vaping Use: Never used  Substance Use Topics   Alcohol use: Not Currently    Alcohol/week: 0.0 standard drinks    Comment: on occasion   Drug use: Not Currently    Types: Marijuana    Comment: Previously, but stopped years ago    Family History  Problem Relation Age of Onset   Stroke Father    Hypertension  Daughter    Migraines Daughter    Cervical cancer Maternal Grandmother    Heart disease Maternal Grandfather    Stroke Maternal Grandfather     Review of Systems: A 12-system review of systems was performed and was negative except as noted in the HPI.  --------------------------------------------------------------------------------------------------  Physical Exam: BP 120/80 (BP Location: Left Arm, Patient Position: Sitting, Cuff Size: Large)   Pulse 78   Ht 5\' 5"  (1.651 m)   Wt 244 lb (110.7 kg)   LMP 05/07/2016   SpO2 96%   BMI 40.60 kg/m   General:  NAD. Neck: No JVD or HJR. Lungs: Clear to auscultation bilaterally without wheezes or crackles. Heart: Regular rate and rhythm without murmurs, rubs, or gallops. Abdomen: Soft, nontender, nondistended. Extremities: No lower extremity edema. Neuro: 4/5 right shoulder abduction and adduction (question pain limited).  Otherwise, normal strength and sensation throughout.  EKG: Normal sinus rhythm with nonspecific T wave abnormality.  No significant change since 07/17/2020.  Lab Results  Component Value Date   WBC 11.9 (H) 08/24/2020   HGB 11.7 (L) 08/24/2020   HCT 35.9 (L) 08/24/2020   MCV 82.7 08/24/2020   PLT 350 08/24/2020    Lab Results  Component Value Date   NA 139 08/24/2020   K 3.1 (L) 08/24/2020   CL 106 08/24/2020   CO2 24 08/24/2020   BUN 8 08/24/2020   CREATININE 0.65 08/24/2020   GLUCOSE 136 (H) 08/24/2020   ALT 21 04/03/2020    Lab Results  Component Value Date   CHOL 127 01/22/2020   HDL 38 (L) 01/22/2020   LDLCALC 67 01/22/2020   TRIG 134 01/22/2020   CHOLHDL 3.3 01/22/2020    --------------------------------------------------------------------------------------------------  ASSESSMENT AND PLAN: Right arm pain/weakness, headache, and history of stroke: Symptoms are nonspecific.  I suspect her right arm pain/weakness is most consistent with a musculoskeletal etiology, though it is concerning  that symptoms are somewhat reminiscent of what Ms. Raymond experienced at the time of her stroke.  Notably, she was off her medications including apixaban for over a week.  She has been back on the medications for the last few days.  She is in sinus rhythm today.  She refused ED evaluation today.  We attempted to arrange for CT of the head today, though Ms. Cadenas left the clinic on her own accord before this could be arranged.  We will move forward with noncontrast CT of the head tomorrow.  I told her to seek immediate medical attention if she has worsening symptoms or new focal neurologic deficits elsewhere.  She should continue indefinite anticoagulation in the setting of PAF and PFO.  Paroxysmal atrial fibrillation: Continue current medications including metoprolol and apixaban.  Importance of medication compliance was reinforced.  PFO: Given indication for long-term anticoagulation, no plans for closure.  Hypertension: Blood pressure reasonable today.  No medication changes at this time.  Follow-up: Return to clinic  in 3 months.  [name], MD 10/24/2020 4:21 PM

## 2020-10-24 NOTE — Patient Instructions (Addendum)
Medication Instructions:   Your physician recommends that you continue on your current medications as directed. Please refer to the Current Medication list given to you today.   *If you need a refill on your cardiac medications before your next appointment, please call your pharmacy*   Lab Work:  None ordered  Testing/Procedures:  Head CT to be scheduled same day. Per patient request will call to have scheduled tomorrow 10/25/20  Head CT scheduled 10/25/20 at [facility] at 1:00 PM.  Please arrive by 12:50 PM. The address is: [address], [place] [address]  Follow-Up: At [facility], you and your health needs are our priority.  As part of our continuing mission to provide you with exceptional heart care, we have created designated Provider Care Teams.  These Care Teams include your primary Cardiologist (physician) and Advanced Practice Providers (APPs -  Physician Assistants and Nurse Practitioners) who all work together to provide you with the care you need, when you need it.  We recommend signing up for the patient portal called "MyChart".  Sign up information is provided on this After Visit Summary.  MyChart is used to connect with patients for Virtual Visits (Telemedicine).  Patients are able to view lab/test results, encounter notes, upcoming appointments, etc.  Non-urgent messages can be sent to your provider as well.   To learn more about what you can do with MyChart, go to [email or web address].    Your next appointment:   3 month(s)  The format for your next appointment:   In Person  Provider:   You may see [name], MD or one of the following Advanced Practice Providers on your designated Care Team:   Nicolasa Ducking, NP Eula Listen, PA-C Marisue Ivan, PA-C [place], [place]

## 2020-10-25 ENCOUNTER — Ambulatory Visit
Admission: RE | Admit: 2020-10-25 | Discharge: 2020-10-25 | Disposition: A | Discharge status: Home / Self Care | Payer: Medicaid Other | Source: Ambulatory Visit | Attending: Internal Medicine | Admitting: Internal Medicine

## 2020-10-25 ENCOUNTER — Telehealth: Payer: Self-pay | Admitting: *Deleted

## 2020-10-25 ENCOUNTER — Other Ambulatory Visit: Payer: Self-pay

## 2020-10-25 ENCOUNTER — Ambulatory Visit: Payer: Medicaid Other | Admitting: Podiatry

## 2020-10-25 ENCOUNTER — Encounter: Payer: Self-pay | Admitting: Podiatry

## 2020-10-25 DIAGNOSIS — R531 Weakness: Secondary | ICD-10-CM | POA: Insufficient documentation

## 2020-10-25 DIAGNOSIS — B351 Tinea unguium: Secondary | ICD-10-CM

## 2020-10-25 DIAGNOSIS — M79675 Pain in left toe(s): Secondary | ICD-10-CM

## 2020-10-25 DIAGNOSIS — M79674 Pain in right toe(s): Secondary | ICD-10-CM

## 2020-10-25 DIAGNOSIS — R519 Headache, unspecified: Secondary | ICD-10-CM | POA: Diagnosis not present

## 2020-10-25 NOTE — Telephone Encounter (Signed)
Spoke to pt this morning. Notified of CT appointment that is scheduled today.  Head CT scheduled 10/25/20 at [facility] at 1:00 PM. Please arrive by 12:50 PM. The address is: [address], [place] ([address]  Precert confirmed ok to proceed.  Notified [facility].

## 2020-10-28 ENCOUNTER — Ambulatory Visit: Payer: Medicaid Other | Admitting: Podiatry

## 2020-10-28 ENCOUNTER — Telehealth: Payer: Self-pay | Admitting: Internal Medicine

## 2020-10-28 NOTE — Progress Notes (Signed)
   SUBJECTIVE Patient presents to office today complaining of elongated, thickened nails that cause pain while ambulating in shoes.  Patient is unable to trim their own nails. Patient is here for further evaluation and treatment.  Past Medical History:  Diagnosis Date   Anxiety    Arthritis    Asthma    controlled   CVA (cerebral vascular accident) (HCC)    a. 09/2016   Depression    Dysrhythmia    Headache    Hypertension    Hypothyroidism    Morbid obesity (HCC)    PAF (paroxysmal atrial fibrillation) (HCC) 09/2016   a. 30-day event monitor 6/18: predominant rhythm of sinus with isolated PACs and PVCs.  Two narrow complex tachycardia episodes concerning for A. Fib +/-atrial flutter or SVT.  No prolonged pauses; b. CHADS2VASc = 3 (stroke x 2, female)--> Eliquis.   PFO (patent foramen ovale)    a. TTE 6/18: EF of 60 to 65%, normal wall motion, grade 1 diastolic dysfunction; b. TEE 6/18: evidence of a very small atrial level right to left shunt by bubble study without visualization of ASD or PFO   Substance abuse (HCC)    Past use of cocaine    Tobacco abuse     OBJECTIVE General Patient is awake, alert, and oriented x 3 and in no acute distress. Derm Skin is dry and supple bilateral. Negative open lesions or macerations. Remaining integument unremarkable. Nails are tender, long, thickened and dystrophic with subungual debris, consistent with onychomycosis, 1-5 bilateral. No signs of infection noted. Vasc  DP and PT pedal pulses palpable bilaterally. Temperature gradient within normal limits.  Neuro Epicritic and protective threshold sensation grossly intact bilaterally.  Musculoskeletal Exam No symptomatic pedal deformities noted bilateral. Muscular strength within normal limits.  ASSESSMENT 1.  Pain due to onychomycosis of toenails both  PLAN OF CARE 1. Patient evaluated today.  2. Instructed to maintain good pedal hygiene and foot care.  3. Mechanical debridement of nails 1-5  bilaterally performed using a nail nipper. Filed with dremel without incident.  4.  Continue oral Lamisil 250 mg #90 as prescribed 5.  Return to clinic in 3 mos.    Felecia Shelling, DPM [facility]  Dr. Felecia Shelling, DPM    [address] [place], [place] [address]                Office [phone]  Fax [phone]

## 2020-10-28 NOTE — Telephone Encounter (Signed)
The patient has been notified of the result and verbalized understanding.  All questions (if any) were answered.     

## 2020-10-28 NOTE — Telephone Encounter (Signed)
Patient calling to check the status of CT results

## 2020-12-20 ENCOUNTER — Encounter (HOSPITAL_COMMUNITY): Payer: Self-pay | Admitting: Emergency Medicine

## 2020-12-20 ENCOUNTER — Other Ambulatory Visit: Payer: Self-pay

## 2020-12-20 ENCOUNTER — Ambulatory Visit (HOSPITAL_COMMUNITY)
Admission: EM | Admit: 2020-12-20 | Discharge: 2020-12-20 | Disposition: A | Discharge status: Home / Self Care | Payer: Medicaid Other | Attending: Emergency Medicine | Admitting: Emergency Medicine

## 2020-12-20 ENCOUNTER — Ambulatory Visit (INDEPENDENT_AMBULATORY_CARE_PROVIDER_SITE_OTHER): Payer: Medicaid Other

## 2020-12-20 DIAGNOSIS — J189 Pneumonia, unspecified organism: Secondary | ICD-10-CM | POA: Diagnosis not present

## 2020-12-20 DIAGNOSIS — R062 Wheezing: Secondary | ICD-10-CM | POA: Diagnosis not present

## 2020-12-20 DIAGNOSIS — J0140 Acute pansinusitis, unspecified: Secondary | ICD-10-CM

## 2020-12-20 DIAGNOSIS — R079 Chest pain, unspecified: Secondary | ICD-10-CM

## 2020-12-20 DIAGNOSIS — J45901 Unspecified asthma with (acute) exacerbation: Secondary | ICD-10-CM | POA: Diagnosis not present

## 2020-12-20 MED ORDER — AEROCHAMBER PLUS FLO-VU LARGE MISC
Status: AC
  Filled 2020-12-20: qty 1

## 2020-12-20 MED ORDER — AEROCHAMBER PLUS FLO-VU MEDIUM MISC
1.0000 | Freq: Once | Status: AC
  Administered 2020-12-20: 1

## 2020-12-20 MED ORDER — ALBUTEROL SULFATE HFA 108 (90 BASE) MCG/ACT IN AERS
4.0000 | INHALATION_SPRAY | Freq: Once | RESPIRATORY_TRACT | Status: AC
  Administered 2020-12-20: 4 via RESPIRATORY_TRACT

## 2020-12-20 MED ORDER — AMOXICILLIN-POT CLAVULANATE 875-125 MG PO TABS: 1.0000 | ORAL_TABLET | Freq: Two times a day (BID) | ORAL | 0 refills | Status: DC

## 2020-12-20 MED ORDER — ALBUTEROL SULFATE HFA 108 (90 BASE) MCG/ACT IN AERS
INHALATION_SPRAY | RESPIRATORY_TRACT | Status: AC
  Filled 2020-12-20: qty 6.7

## 2020-12-20 MED ORDER — FLUTICASONE PROPIONATE 50 MCG/ACT NA SUSP: 2.0000 | Freq: Every day | NASAL | 0 refills | Status: DC

## 2020-12-20 MED ORDER — PREDNISONE 20 MG PO TABS: 40.0000 mg | ORAL_TABLET | Freq: Every day | ORAL | 0 refills | Status: AC

## 2020-12-20 NOTE — Discharge Instructions (Addendum)
2 puffs from your albuterol inhaler every 4-6 hours as needed for coughing, wheezing.  I am sending you home with some prednisone for an asthma exacerbation.  Flonase, saline nasal irrigation with a Lloyd Huger Med rinse and distilled water as often as you want.  You can also try some Mucinex for the nasal congestion.  Finish the Augmentin.  I am treating you for sinusitis.  Go immediately to the ER if your acid reflux symptoms get worse, you start having chest pressure, heaviness, if it goes up your neck, down your arm, through to your back, or if it gets worse with exertion.  Below is a list of primary care practices who are taking new patients for you to follow-up with.  [facility] internal medicine clinic Ground Floor - [facility], [address] [place], [place], [place] [address] [phone]  [facility] at [facility] [address] [place] [address] [place], [place] [address] [phone]  [facility] [place] [facility] [address] [place], [place] [address] [phone]  [facility] [phone] [address] [place] [address]  [facility]: [address] [place] [address]  [phone]  [facility]: [address] [place] [address]  [phone]  [place] [facility] : [address] [place] [address] [phone]  [facility] Primary Care: [address] [place] [address] [phone]  [facility]: [address] [place] [address] [phone]  Dr. Oneal Grout [address] [facility] [place] [address]  [phone]  Go to www.goodrx.com  or www.costplusdrugs.com to look up your medications. This will give you a list of where you can find your prescriptions at the most affordable prices. Or ask the pharmacist  what the cash price is, or if they have any other discount programs available to help make your medication more affordable. This can be less expensive than what you would pay with insurance.

## 2020-12-20 NOTE — ED Provider Notes (Signed)
HPI  SUBJECTIVE:  Margaret [name] is a 50 y.o. female who presents with shortness of breath, chills, body aches, headaches, nasal congestion, sinus pain and pressure, postnasal drip, sore throat, cough, wheezing, nausea, chest tightness for the past week.  the chest tightness is very mild, burning, and is accompanied with waterbrash.  Lasts about 5 minutes and resolves.  It is not described as pressure, heaviness, no accompanying palpitations or diaphoresis.  It does not radiate to her jaw, up her neck , down her arm or through to her back.  There is no exertional component.  She states that she has had identical symptoms before with GERD.  She reports dyspnea with exertion.  States that she can go up half a flight of stairs and then gets short of breath. Denies unintentional weight gain, lower extremity edema, orthopnea, nocturia, PND, abdominal pain.  No fevers, loss of sense of smell or taste, vomiting, diarrhea.  No antipyretic in the past 6 hours.  She has been taking Tylenol with improvement in her headache and using a numbing throat syrup with improvement in the cough.  Symptoms are worse with coughing.  No known COVID exposure, but she is a Research scientist (physical sciences).  She got the COVID booster.  She has a past medical history including CVA, hypercholesterolemia, atrial fibrillation on Eliquis, hypertension, asthma, does not have an albuterol inhaler, COVID in February 22, GERD.  LMP: Years ago.  PMD: None   Past Medical History:  Diagnosis Date   Anxiety    Arthritis    Asthma    controlled   CVA (cerebral vascular accident) (HCC)    a. 09/2016   Depression    Dysrhythmia    Headache    Hypertension    Hypothyroidism    Morbid obesity (HCC)    PAF (paroxysmal atrial fibrillation) (HCC) 09/2016   a. 30-day event monitor 6/18: predominant rhythm of sinus with isolated PACs and PVCs.  Two narrow complex tachycardia episodes concerning for A. Fib +/-atrial flutter or SVT.  No prolonged pauses; b.  CHADS2VASc = 3 (stroke x 2, female)--> Eliquis.   PFO (patent foramen ovale)    a. TTE 6/18: EF of 60 to 65%, normal wall motion, grade 1 diastolic dysfunction; b. TEE 6/18: evidence of a very small atrial level right to left shunt by bubble study without visualization of ASD or PFO   Substance abuse (HCC)    Past use of cocaine    Tobacco abuse     Past Surgical History:  Procedure Laterality Date   FRACTURE SURGERY     TEE WITHOUT CARDIOVERSION N/A 09/08/2016   Procedure: TRANSESOPHAGEAL ECHOCARDIOGRAM (TEE);  Surgeon: [name], MD;  Location: [facility] ORS;  Service: Cardiovascular;  Laterality: N/A;   XI ROBOTIC ASSISTED VENTRAL HERNIA N/A 09/13/2019   Procedure: XI ROBOTIC Cholecystectomy, with primary non-mesh repair of EPIGATRIC HERNIA;  Surgeon: Campbell Lerner, MD;  Location: [facility] ORS;  Service: General;  Laterality: N/A;    Family History  Problem Relation Age of Onset   Stroke Father    Hypertension Daughter    Migraines Daughter    Cervical cancer Maternal Grandmother    Heart disease Maternal Grandfather    Stroke Maternal Grandfather     Social History   Tobacco Use   Smoking status: Every Day    Packs/day: 10.00    Types: Cigarettes   Smokeless tobacco: Former    Types: Snuff   Tobacco comments:    trying to quit; going to start  using patches;   Vaping Use   Vaping Use: Never used  Substance Use Topics   Alcohol use: Not Currently    Alcohol/week: 0.0 standard drinks    Comment: on occasion   Drug use: Not Currently    Types: Marijuana    Comment: Previously, but stopped years ago    No current facility-administered medications for this encounter.  Current Outpatient Medications:    amoxicillin-clavulanate (AUGMENTIN) 875-125 MG tablet, Take 1 tablet by mouth 2 (two) times daily. X 7 days, Disp: 14 tablet, Rfl: 0   fluticasone (FLONASE) 50 MCG/ACT nasal spray, Place 2 sprays into both nostrils daily., Disp: 16 g, Rfl: 0   predniSONE (DELTASONE) 20  MG tablet, Take 2 tablets (40 mg total) by mouth daily with breakfast for 5 days., Disp: 10 tablet, Rfl: 0   apixaban (ELIQUIS) 5 MG TABS tablet, Take 1 tablet (5 mg total) by mouth 2 (two) times daily., Disp: 180 tablet, Rfl: 1   atorvastatin (LIPITOR) 80 MG tablet, Take 1 tablet (80 mg total) by mouth at bedtime. Please schedule office visit for further refills. Thank you!, Disp: 30 tablet, Rfl: 0   Cyanocobalamin (B-12 PO), Take 1 tablet by mouth daily., Disp: , Rfl:    levothyroxine (SYNTHROID) 125 MCG tablet, Take 1 tablet by mouth once a day  1 by mouth daily for hypothyroid, Disp: , Rfl:    metoprolol tartrate (LOPRESSOR) 25 MG tablet, Take 1 tablet (25 mg total) by mouth 2 (two) times daily. Please schedule office visit for further refills. Thank you!, Disp: 60 tablet, Rfl: 0  No Known Allergies   ROS  As noted in HPI.   Physical Exam  BP 123/87 (BP Location: Right Arm)   Pulse 84   Temp 98.8 F (37.1 C) (Oral)   Resp 16   LMP 05/07/2016   SpO2 96%   Constitutional: Well developed, well nourished, no acute distress Eyes:  EOMI, conjunctiva normal bilaterally HENT: Normocephalic, atraumatic,mucus membranes moist.  Erythematous, swollen turbinates.  Mucoid nasal congestion.  Positive right-sided maxillary, frontal sinus tenderness.  No postnasal drip. Respiratory: Normal inspiratory effort, fair air movement, expiratory wheezing throughout all lung fields.  No chest wall tenderness Cardiovascular: Normal rate, regular rhythm, no murmurs rubs or gallops GI: nondistended skin: No rash, skin intact Musculoskeletal: No edema Neurologic: Alert & oriented x 3, no focal neuro deficits Psychiatric: Speech and behavior appropriate   ED Course   Medications  albuterol (VENTOLIN HFA) 108 (90 Base) MCG/ACT inhaler 4 puff (4 puffs Inhalation Given 12/20/20 0906)  AeroChamber Plus Flo-Vu Medium MISC 1 each (1 each Other Given 12/20/20 0906)    Orders Placed This Encounter   Procedures   DG Chest 2 View    Standing Status:   Standing    Number of Occurrences:   1    Order Specific Question:   Reason for Exam (SYMPTOM  OR DIAGNOSIS REQUIRED)    Answer:   CP wheeziong r/o PNA    No results found for this or any previous visit (from the past 24 hour(s)). No results found.  ED Clinical Impression  1. Community acquired pneumonia of right lower lobe of lung   2. Moderate asthma with acute exacerbation, unspecified whether persistent   3. Acute non-recurrent pansinusitis      ED Assessment/Plan  Presentation consistent with a URI/asthma exacerbation deferred COVID testing because she is out of the treatment window and it would not change management.  will check a chest x-ray given duration  of symptoms to rule out pneumonia.  Chest pain seems very much like acid reflux, she has had identical symptoms before, and has been doing a lot of coughing, doubt ACS.  Will give 2- puffs from an albuterol inhaler with a spacer here.  Deferring EKG.  She also has right-sided sinus tenderness.  Will send home with Augmentin, Flonase, saline nasal irrigation for sinusitis.  On re-evaluation, patient states she feels much better after 2 puffs from her albuterol inhaler.  Chest tightness has resolved.  will contact patient only if x-ray is abnormal.  Imaging not crossing over.  Reviewed imaging report.  Patchy opacities in the right lower lobe atelectasis versus pneumonia.  Recommend follow-up chest x-ray in 6 to 8 weeks.  Central bronchial wall thickening that can be seen with viral infection or reactive airway disease.  See radiology report for full details.  X-ray suggestive of pneumonia.  Patient was sent home with prednisone, an albuterol inhaler with a spacer, Augmentin, Flonase, Augmentin will cover both pneumonia and sinusitis.  she is to do saline nasal irrigation and try Mucinex for the nasal congestion.  Will provide primary care list for routine care and order  assistance in finding a PMD  1016-attempted to reach patient on both lines to discuss x-ray.  1 phone number has no voicemail, no answer at the home phone.  1045-discussed x-ray results with patient.  Advise repeat x-ray in 6 to 8 weeks, and finish antibiotics even if she feels better.  She agrees with plan  Discussed imaging, MDM, treatment plan, and plan for follow-up with patient. Discussed sn/sx that should prompt return to the ED. patient agrees with plan.   Meds ordered this encounter  Medications   albuterol (VENTOLIN HFA) 108 (90 Base) MCG/ACT inhaler 4 puff   AeroChamber Plus Flo-Vu Medium MISC 1 each   amoxicillin-clavulanate (AUGMENTIN) 875-125 MG tablet    Sig: Take 1 tablet by mouth 2 (two) times daily. X 7 days    Dispense:  14 tablet    Refill:  0   fluticasone (FLONASE) 50 MCG/ACT nasal spray    Sig: Place 2 sprays into both nostrils daily.    Dispense:  16 g    Refill:  0   predniSONE (DELTASONE) 20 MG tablet    Sig: Take 2 tablets (40 mg total) by mouth daily with breakfast for 5 days.    Dispense:  10 tablet    Refill:  0      *This clinic note was created using Scientist, clinical (histocompatibility and immunogenetics). Therefore, there may be occasional mistakes despite careful proofreading.  ?    Domenick Gong, MD 12/21/20 1303

## 2020-12-20 NOTE — ED Triage Notes (Signed)
Pt presents with chest tightness, sob, congestion and cough xs 4 days.

## 2020-12-31 ENCOUNTER — Ambulatory Visit: Payer: Self-pay | Admitting: Nurse Practitioner

## 2021-01-03 ENCOUNTER — Ambulatory Visit: Payer: Self-pay | Admitting: Nurse Practitioner

## 2021-01-15 ENCOUNTER — Other Ambulatory Visit: Payer: Self-pay

## 2021-01-15 DIAGNOSIS — I480 Paroxysmal atrial fibrillation: Secondary | ICD-10-CM

## 2021-01-15 MED ORDER — APIXABAN 5 MG PO TABS: 5.0000 mg | ORAL_TABLET | Freq: Two times a day (BID) | ORAL | 1 refills | Status: DC

## 2021-01-15 MED ORDER — METOPROLOL TARTRATE 25 MG PO TABS: 25.0000 mg | ORAL_TABLET | Freq: Two times a day (BID) | ORAL | 0 refills | Status: DC

## 2021-01-15 MED ORDER — ATORVASTATIN CALCIUM 80 MG PO TABS: 80.0000 mg | ORAL_TABLET | Freq: Every day | ORAL | 0 refills | Status: DC

## 2021-01-15 NOTE — Telephone Encounter (Signed)
Refill Request.  

## 2021-01-15 NOTE — Telephone Encounter (Signed)
*  STAT* If patient is at the pharmacy, call can be transferred to refill team.   1. Which medications need to be refilled? (please list name of each medication and dose if known) Metoprolol , Lipitor, Eliquis  2. Which pharmacy/location (including street and city if local pharmacy) is medication to be sent to? [facility]  3. Do they need a 30 day or 90 day supply? 90

## 2021-01-15 NOTE — Telephone Encounter (Signed)
Prescription refill request for Eliquis received. Indication: Atrial Fib/CVA Last office visit: 10/24/20  C End MD Scr: 0.65 on 08/24/20 Age:  50 Weight: 110.7kg  Based on above findings Eliquis 5mg  twice daily id the appropriate dose.  Refill approved.

## 2021-01-15 NOTE — Telephone Encounter (Signed)
Please contact pt for future follow up. Pt due for 3 month f/u.

## 2021-01-16 ENCOUNTER — Telehealth: Payer: Self-pay | Admitting: Internal Medicine

## 2021-01-16 MED ORDER — METOPROLOL TARTRATE 25 MG PO TABS: 25.0000 mg | ORAL_TABLET | Freq: Two times a day (BID) | ORAL | 0 refills | Status: DC

## 2021-01-16 MED ORDER — ATORVASTATIN CALCIUM 80 MG PO TABS: 80.0000 mg | ORAL_TABLET | Freq: Every day | ORAL | 0 refills | Status: DC

## 2021-01-16 NOTE — Telephone Encounter (Signed)
Requested Prescriptions   Signed Prescriptions Disp Refills   metoprolol tartrate (LOPRESSOR) 25 MG tablet 180 tablet 0    Sig: Take 1 tablet (25 mg total) by mouth 2 (two) times daily.    Authorizing Provider: END, CHRISTOPHER    Ordering User: NEWCOMER MCCLAIN, Lajoyce Tamura L   atorvastatin (LIPITOR) 80 MG tablet 90 tablet 0    Sig: Take 1 tablet (80 mg total) by mouth at bedtime.    Authorizing Provider: END, CHRISTOPHER    Ordering User: Thayer Headings, Daily Crate L

## 2021-01-16 NOTE — Telephone Encounter (Signed)
*  STAT* If patient is at the pharmacy, call can be transferred to refill team.   1. Which medications need to be refilled? (please list name of each medication and dose if known)   Metorprolol and atorvastatin   2. Which pharmacy/location (including street and city if local pharmacy) is medication to be sent to?  Change -  [facility] on [address] [place]   3. Do they need a 30 day or 90 day supply? 90

## 2021-01-21 ENCOUNTER — Ambulatory Visit: Payer: Medicaid Other | Admitting: Podiatry

## 2021-01-24 ENCOUNTER — Other Ambulatory Visit: Payer: Self-pay

## 2021-01-24 ENCOUNTER — Ambulatory Visit (INDEPENDENT_AMBULATORY_CARE_PROVIDER_SITE_OTHER): Payer: Medicaid Other | Admitting: Medical

## 2021-01-24 ENCOUNTER — Encounter: Payer: Self-pay | Admitting: Medical

## 2021-01-24 ENCOUNTER — Ambulatory Visit: Payer: Medicaid Other | Admitting: Medical

## 2021-01-24 VITALS — BP 120/78 | HR 77 | Ht 66.0 in | Wt 236.0 lb

## 2021-01-24 DIAGNOSIS — I10 Essential (primary) hypertension: Secondary | ICD-10-CM

## 2021-01-24 DIAGNOSIS — Q2112 Patent foramen ovale: Secondary | ICD-10-CM | POA: Diagnosis not present

## 2021-01-24 DIAGNOSIS — I480 Paroxysmal atrial fibrillation: Secondary | ICD-10-CM

## 2021-01-24 DIAGNOSIS — M7989 Other specified soft tissue disorders: Secondary | ICD-10-CM

## 2021-01-24 NOTE — Patient Instructions (Addendum)
Recommendations were to have DVT scan and patient wanted CT Head for her migraines as well. Provider went to speak with her and then she declined all testing. Patient states she is moving to [facility] and would follow up there. She then walked out without paperwork.

## 2021-01-24 NOTE — Progress Notes (Signed)
Cardiology Office Note:    Date:  01/24/2021   ID:  Margaret [name], DOB September 17, 1970, MRN [id number]  PCP:  [name], FNP  [facility] Cardiologist:  [name], MD  [facility] Electrophysiologist:  None   Referring MD: [name], FNP   Chief Complaint: 3 month follow-up  History of Present Illness:    Margaret [name] is a 50 y.o. female with a hx of PAF, stroke, patent foramen ovale who presents for 3 month follow-up.   Last seen 10/2020 and reported headaches, right arm pain/weakness suspected MSK etiology. She reported she was off Eliquis for a week. She refused ED evaluation. Attempted CT of the head but she left the clinic on her own accord.   Today, the patient reports some lower ankle swelling on the left. This is longstanding however, we discussed possibility of a lower leg clot, and patient would like an [place].  She has lost 15lbs. Trying to lose weight with diet changes. At work she walks a lot. No chest pain or shortness of breath with exertion. No orthopnea or pnd. Patient reports compliance with Eliquis. Has occasional palpitations. She denies bleeding issues on Eliquis. She was requesting CT of the head since Dr. Okey Dupre was wanting to order that at the last visit, however patient is not having the same symptoms, so  explained CT head is not indicated at this time. If she were to develop headache/arm weakness she was recommended to go to the ER. Patient plans on changing care to [facility].   Past Medical History:  Diagnosis Date   Anxiety    Arthritis    Asthma    controlled   CVA (cerebral vascular accident) (HCC)    a. 09/2016   Depression    Dysrhythmia    Headache    Hypertension    Hypothyroidism    Morbid obesity (HCC)    PAF (paroxysmal atrial fibrillation) (HCC) 09/2016   a. 30-day event monitor 6/18: predominant rhythm of sinus with isolated PACs and PVCs.  Two narrow complex tachycardia episodes concerning for A. Fib +/-atrial flutter or SVT.   No prolonged pauses; b. CHADS2VASc = 3 (stroke x 2, female)--> Eliquis.   PFO (patent foramen ovale)    a. TTE 6/18: EF of 60 to 65%, normal wall motion, grade 1 diastolic dysfunction; b. TEE 6/18: evidence of a very small atrial level right to left shunt by bubble study without visualization of ASD or PFO   Substance abuse (HCC)    Past use of cocaine    Tobacco abuse     Past Surgical History:  Procedure Laterality Date   FRACTURE SURGERY     TEE WITHOUT CARDIOVERSION N/A 09/08/2016   Procedure: TRANSESOPHAGEAL ECHOCARDIOGRAM (TEE);  Surgeon: [name], MD;  Location: [facility] ORS;  Service: Cardiovascular;  Laterality: N/A;   XI ROBOTIC ASSISTED VENTRAL HERNIA N/A 09/13/2019   Procedure: XI ROBOTIC Cholecystectomy, with primary non-mesh repair of EPIGATRIC HERNIA;  Surgeon: Campbell Lerner, MD;  Location: [facility] ORS;  Service: General;  Laterality: N/A;    Current Medications: Current Meds  Medication Sig   apixaban (ELIQUIS) 5 MG TABS tablet Take 1 tablet (5 mg total) by mouth 2 (two) times daily.   atorvastatin (LIPITOR) 80 MG tablet Take 1 tablet (80 mg total) by mouth at bedtime.   Cyanocobalamin (B-12 PO) Take 1 tablet by mouth daily.   fluticasone (FLONASE) 50 MCG/ACT nasal spray Place 2 sprays into both nostrils daily.   levothyroxine (SYNTHROID)  125 MCG tablet Take 1 tablet by mouth once a day  1 by mouth daily for hypothyroid   metoprolol tartrate (LOPRESSOR) 25 MG tablet Take 1 tablet (25 mg total) by mouth 2 (two) times daily.   [DISCONTINUED] amoxicillin-clavulanate (AUGMENTIN) 875-125 MG tablet Take 1 tablet by mouth 2 (two) times daily. X 7 days     Allergies:   Patient has no known allergies.   Social History   Socioeconomic History   Marital status: Divorced    Spouse name: Not on file   Number of children: 2   Years of education: 12   Highest education level: Not on file  Occupational History   Occupation: [employer]  Tobacco Use   Smoking status:  Every Day    Packs/day: 10.00    Types: Cigarettes   Smokeless tobacco: Former    Types: Snuff   Tobacco comments:    trying to quit; going to start using patches;   Vaping Use   Vaping Use: Never used  Substance and Sexual Activity   Alcohol use: Not Currently    Alcohol/week: 0.0 standard drinks    Comment: on occasion   Drug use: Not Currently    Types: Marijuana    Comment: Previously, but stopped years ago   Sexual activity: Yes    Partners: Male    Birth control/protection: Condom  Other Topics Concern   Not on file  Social History Narrative   Lives with daughter   Caffeine use: daily   Right handed   Social Determinants of Health   Financial Resource Strain: Not on file  Food Insecurity: Not on file  Transportation Needs: Not on file  Physical Activity: Not on file  Stress: Not on file  Social Connections: Not on file     Family History: The patient's family history includes Cervical cancer in her maternal grandmother; Heart disease in her maternal grandfather; Hypertension in her daughter; Migraines in her daughter; Stroke in her father and maternal grandfather.  ROS:   Please see the history of present illness.     All other systems reviewed and are negative.  EKGs/Labs/Other Studies Reviewed:    The following studies were reviewed today:  Echo 2018 Study Conclusions   - Left ventricle: The cavity size was normal. Wall thickness was    normal. Systolic function was normal. The estimated ejection    fraction was in the range of 60% to 65%. Wall motion was normal;    there were no regional wall motion abnormalities. Doppler    parameters are consistent with abnormal left ventricular    relaxation (grade 1 diastolic dysfunction).   Impressions:   - No cardiac source of emboli was indentified.  EKG:  EKG is not ordered today.    Recent Labs: 04/03/2020: ALT 21; TSH 9.480 08/24/2020: BUN 8; Creatinine, Ser 0.65; Hemoglobin 11.7; Platelets 350;  Potassium 3.1; Sodium 139  Recent Lipid Panel    Component Value Date/Time   CHOL 127 01/22/2020 1107   CHOL 135 02/24/2018 1130   CHOL 140 09/04/2013 0405   TRIG 134 01/22/2020 1107   TRIG 137 09/04/2013 0405   HDL 38 (L) 01/22/2020 1107   HDL 37 (L) 02/24/2018 1130   HDL 30 (L) 09/04/2013 0405   CHOLHDL 3.3 01/22/2020 1107   VLDL 11 09/07/2016 0442   VLDL 27 09/04/2013 0405   LDLCALC 67 01/22/2020 1107   LDLCALC 83 09/04/2013 0405      Physical Exam:    VS:  BP 120/78   Pulse 77   Ht 5\' 6"  (1.676 m)   Wt 236 lb (107 kg)   LMP 05/07/2016   SpO2 98%   BMI 38.09 kg/m     Wt Readings from Last 3 Encounters:  01/24/21 236 lb (107 kg)  10/24/20 244 lb (110.7 kg)  08/24/20 246 lb 0.5 oz (111.6 kg)     GEN:  Well nourished, well developed in no acute distress HEENT: Normal NECK: No JVD; No carotid bruits LYMPHATICS: No lymphadenopathy CARDIAC: RRR, no murmurs, rubs, gallops RESPIRATORY:  Clear to auscultation without rales, wheezing or rhonchi  ABDOMEN: Soft, non-tender, non-distended MUSCULOSKELETAL:  No edema; No deformity  SKIN: Warm and dry NEUROLOGIC:  Alert and oriented x 3 PSYCHIATRIC:  Normal affect   ASSESSMENT:    1. PFO (patent foramen ovale)   2. Paroxysmal atrial fibrillation (HCC)   3. Essential hypertension   4. Swelling of lower leg    PLAN:    In order of problems listed above:  Lower leg swelling Patient reports intermittent lower leg swelling on the left for months. No calf pain. She reports compliance with Eliquis. On exam I do no appreciate lower leg swelling. Low suspicion for DVT. Discussed option of DVT study, but patient refused and walked out. She plans to follow-up in [place] since she is moving there.   PAF She is in NSR by exam. Continue stroke ppx with Eliquis 5mg  BID. Continue rate control with Lopressor  PFO She is on long-term anticoagulation. No plans for closure.   HTN BP is good. Continue Lopressor.    Disposition: Follow up in 3 months with MD/APP    Signed, Kadijah Shamoon [place], PA-C  01/24/2021 4:05 PM    [facility]

## 2021-01-29 ENCOUNTER — Encounter: Payer: Self-pay | Admitting: *Deleted

## 2021-01-29 ENCOUNTER — Telehealth: Payer: Self-pay | Admitting: Internal Medicine

## 2021-01-29 NOTE — Telephone Encounter (Signed)
I spoke with the patient. She inquired if she is going to undergo FIT testing for an N95 for her work and she advised she thought this was correct.   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  I advised her there should be no contraindication for testing. She advised she will need a note for work stating she is ok to undergo testing.   Secure chat sent to Dr. Okey Dupre to see if he is ok signing off on this letter.

## 2021-01-29 NOTE — Telephone Encounter (Signed)
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

## 2021-01-29 NOTE — Telephone Encounter (Signed)
Secure chat message received from Dr. Okey Dupre- ok for note to be written under him.  Letter done and placed at our front desk.  Attempted to call the patient. No answer- I left a message for the patient stating her letter was ready for pick up, but to feel free to call back with any further questions/ concerns.

## 2021-01-29 NOTE — Telephone Encounter (Signed)
Attempted to call the patient. No answer- I left a message to please call back.  

## 2021-02-04 ENCOUNTER — Ambulatory Visit: Payer: Medicaid Other | Admitting: Podiatry

## 2021-02-04 ENCOUNTER — Telehealth: Payer: Self-pay | Admitting: Internal Medicine

## 2021-02-04 NOTE — Telephone Encounter (Signed)
Called patient back and she reported that the left side of her neck has swollen glands and it hurts when she turns her neck.  She is requesting a scan for her thyroid. She also stated that she had to leave her appointment with Cadence on 01/24/21 and that she had wanted to draw labs, so now she would like to get them. I do not see any lab orders or mention of lab draw in OV note. She asked if a blood draw would show any blockages she may have. I do see in the OV note that the patient left the appointment. She also stated that she is feeling a sharp pain in the back of her right arm, shoulder blade and neck that goes away when she changes to lay on her other side and that this has been going on for a couple weeks.  I informed patient that she might want to call her PCP in regards to the neck swelling.  Will route to Dr. Okey Dupre for review.

## 2021-02-04 NOTE — Telephone Encounter (Signed)
I recommend that she reach out to her PCP to discuss these concerns.  [name], MD [facility]

## 2021-02-04 NOTE — Telephone Encounter (Signed)
Called patient and left a detailed message per DPR on file with Dr. Serita Kyle recommendation as stated below.

## 2021-02-04 NOTE — Telephone Encounter (Signed)
Patient would like to discuss a referral for her thyroid. States the side of her throat and she can feel a "heartbeat". Please call.

## 2021-02-19 ENCOUNTER — Ambulatory Visit: Payer: Medicaid Other | Admitting: Podiatry

## 2021-03-20 ENCOUNTER — Ambulatory Visit: Payer: Medicaid Other | Admitting: Family Medicine

## 2021-04-14 ENCOUNTER — Ambulatory Visit: Payer: Medicaid Other | Admitting: Nurse Practitioner

## 2021-04-16 NOTE — Progress Notes (Deleted)
Cardiology Office Note    Date:  04/16/2021   ID:  Margaret [name], DOB [birth year], MRN [id number]   PCP:  Verl Bangs, [place]  Cardiologist:  Nelva Bush, MD *** Advanced Practice Provider:  No care team member to display Electrophysiologist:  None   [phone]   No chief complaint on file.   History of Present Illness:  Margaret [name] is a 51 y.o. female   with a hx of PAF, stroke, patent foramen ovale   She was seen 10/2020 and reported headaches, right arm pain/weakness suspected MSK etiology. She reported she was off Eliquis for a week. She refused ED evaluation. Attempted CT of the head but she left the clinic on her own accord.   Last seen by [name] 01/2021 with LE edema but decline duplex to rule out DVT.    Past Medical History:  Diagnosis Date   Anxiety    Arthritis    Asthma    controlled   CVA (cerebral vascular accident) ([place])    a. 09/2016   Depression    Dysrhythmia    Headache    Hypertension    Hypothyroidism    Morbid obesity ([place])    PAF (paroxysmal atrial fibrillation) ([place]) 09/2016   a. 30-day event monitor 6/18: predominant rhythm of sinus with isolated PACs and PVCs.  Two narrow complex tachycardia episodes concerning for A. Fib +/-atrial flutter or SVT.  No prolonged pauses; b. CHADS2VASc = 3 (stroke x 2, female)--> Eliquis.   PFO (patent foramen ovale)    a. TTE 6/18: EF of 60 to 65%, normal wall motion, grade 1 diastolic dysfunction; b. TEE 6/18: evidence of a very small atrial level right to left shunt by bubble study without visualization of ASD or PFO   Substance abuse ([place])    Past use of cocaine    Tobacco abuse     Past Surgical History:  Procedure Laterality Date   FRACTURE SURGERY     TEE WITHOUT CARDIOVERSION N/A 09/08/2016   Procedure: TRANSESOPHAGEAL ECHOCARDIOGRAM (TEE);  Surgeon: Nelva Bush, MD;  Location: [facility] ORS;  Service: Cardiovascular;  Laterality: N/A;   XI ROBOTIC  ASSISTED VENTRAL HERNIA N/A 09/13/2019   Procedure: XI ROBOTIC Cholecystectomy, with primary non-mesh repair of [place];  Surgeon: Ronny Bacon, MD;  Location: [facility] ORS;  Service: General;  Laterality: N/A;    Current Medications: No outpatient medications have been marked as taking for the 04/23/21 encounter (Appointment) with [name], PA-C.     Allergies:   Patient has no known allergies.   Social History   Socioeconomic History   Marital status: Divorced    Spouse name: Not on file   Number of children: 2   Years of education: 12   Highest education level: Not on file  Occupational History   Occupation: [employer]  Tobacco Use   Smoking status: Every Day    Packs/day: 10.00    Types: Cigarettes   Smokeless tobacco: Former    Types: Snuff   Tobacco comments:    trying to quit; going to start using patches;   Vaping Use   Vaping Use: Never used  Substance and Sexual Activity   Alcohol use: Not Currently    Alcohol/week: 0.0 standard drinks    Comment: on occasion   Drug use: Not Currently    Types: Marijuana    Comment: Previously, but stopped years ago   Sexual activity: Yes  Partners: Male    Birth control/protection: Condom  Other Topics Concern   Not on file  Social History Narrative   Lives with daughter   Caffeine use: daily   Right handed   Social Determinants of Health   Financial Resource Strain: Not on file  Food Insecurity: Not on file  Transportation Needs: Not on file  Physical Activity: Not on file  Stress: Not on file  Social Connections: Not on file     Family History:  The patient's ***family history includes Cervical cancer in her maternal grandmother; Heart disease in her maternal grandfather; Hypertension in her daughter; Migraines in her daughter; Stroke in her father and maternal grandfather.   ROS:   Please see the history of present illness.    ROS All other systems reviewed and are negative.   PHYSICAL  EXAM:   VS:  LMP 05/07/2016   Physical Exam  GEN: Well nourished, well developed, in no acute distress  HEENT: normal  Neck: no JVD, carotid bruits, or masses Cardiac:RRR; no murmurs, rubs, or gallops  Respiratory:  clear to auscultation bilaterally, normal work of breathing GI: soft, nontender, nondistended, + BS Ext: without cyanosis, clubbing, or edema, Good distal pulses bilaterally MS: no deformity or atrophy  Skin: warm and dry, no rash Neuro:  Alert and Oriented x 3, Strength and sensation are intact Psych: euthymic mood, full affect  Wt Readings from Last 3 Encounters:  01/24/21 236 lb (107 kg)  10/24/20 244 lb (110.7 kg)  08/24/20 246 lb 0.5 oz (111.6 kg)      Studies/Labs Reviewed:   EKG:  EKG is*** ordered today.  The ekg ordered today demonstrates ***  Recent Labs: 08/24/2020: BUN 8; Creatinine, Ser 0.65; Hemoglobin 11.7; Platelets 350; Potassium 3.1; Sodium 139   Lipid Panel    Component Value Date/Time   CHOL 127 01/22/2020 1107   CHOL 135 02/24/2018 1130   CHOL 140 09/04/2013 0405   TRIG 134 01/22/2020 1107   TRIG 137 09/04/2013 0405   HDL 38 (L) 01/22/2020 1107   HDL 37 (L) 02/24/2018 1130   HDL 30 (L) 09/04/2013 0405   CHOLHDL 3.3 01/22/2020 1107   VLDL 11 09/07/2016 0442   VLDL 27 09/04/2013 0405   LDLCALC 67 01/22/2020 1107   LDLCALC 83 09/04/2013 0405    Additional studies/ records that were reviewed today include:  Echo 2018 Study Conclusions   - Left ventricle: The cavity size was normal. Wall thickness was    normal. Systolic function was normal. The estimated ejection    fraction was in the range of 60% to 65%. Wall motion was normal;    there were no regional wall motion abnormalities. Doppler    parameters are consistent with abnormal left ventricular    relaxation (grade 1 diastolic dysfunction).   Impressions:   - No cardiac source of emboli was indentified.     Risk Assessment/Calculations:   {Does this patient have ATRIAL  FIBRILLATION?:[phone]}     ASSESSMENT:    No diagnosis found.   PLAN:  In order of problems listed above:  PAF  History of CVA  Patent foramen ovale  Shared Decision Making/Informed Consent   {Are you ordering a CV Procedure (e.g. stress test, cath, DCCV, TEE, etc)?   Press F2        :[id number]    Medication Adjustments/Labs and Tests Ordered: Current medicines are reviewed at length with the patient today.  Concerns regarding medicines are outlined above.  Medication changes, Labs  and Tests ordered today are listed in the Patient Instructions below. There are no Patient Instructions on file for this visit.   Sumner Boast, PA-C  04/16/2021 3:09 PM    [facility] [address], [place], [place]  [address] Phone: [phone]; Fax: [phone]

## 2021-04-18 ENCOUNTER — Other Ambulatory Visit: Payer: Self-pay | Admitting: Nurse Practitioner

## 2021-04-18 NOTE — Telephone Encounter (Signed)
Please contact pt for future appointment. Pt due for 3 month f/u. 

## 2021-04-23 ENCOUNTER — Telehealth: Payer: Self-pay | Admitting: Internal Medicine

## 2021-04-23 ENCOUNTER — Other Ambulatory Visit: Payer: Self-pay

## 2021-04-23 ENCOUNTER — Ambulatory Visit: Payer: Medicaid Other | Admitting: Physician Assistant

## 2021-04-23 ENCOUNTER — Encounter: Payer: Self-pay | Admitting: Podiatry

## 2021-04-23 ENCOUNTER — Ambulatory Visit (INDEPENDENT_AMBULATORY_CARE_PROVIDER_SITE_OTHER): Payer: Medicaid Other | Admitting: Podiatry

## 2021-04-23 DIAGNOSIS — M79675 Pain in left toe(s): Secondary | ICD-10-CM

## 2021-04-23 DIAGNOSIS — I480 Paroxysmal atrial fibrillation: Secondary | ICD-10-CM

## 2021-04-23 DIAGNOSIS — M79674 Pain in right toe(s): Secondary | ICD-10-CM | POA: Diagnosis not present

## 2021-04-23 DIAGNOSIS — L603 Nail dystrophy: Secondary | ICD-10-CM

## 2021-04-23 DIAGNOSIS — Q2112 Patent foramen ovale: Secondary | ICD-10-CM

## 2021-04-23 DIAGNOSIS — Z8673 Personal history of transient ischemic attack (TIA), and cerebral infarction without residual deficits: Secondary | ICD-10-CM

## 2021-04-23 DIAGNOSIS — B351 Tinea unguium: Secondary | ICD-10-CM

## 2021-04-23 MED ORDER — METOPROLOL TARTRATE 25 MG PO TABS: 25.0000 mg | ORAL_TABLET | Freq: Two times a day (BID) | ORAL | 0 refills | Status: DC

## 2021-04-23 MED ORDER — ATORVASTATIN CALCIUM 80 MG PO TABS: 80.0000 mg | ORAL_TABLET | Freq: Every day | ORAL | 0 refills | Status: DC

## 2021-04-23 NOTE — Telephone Encounter (Signed)
Requested Prescriptions   Signed Prescriptions Disp Refills   metoprolol tartrate (LOPRESSOR) 25 MG tablet 180 tablet 0    Sig: Take 1 tablet (25 mg total) by mouth 2 (two) times daily.    Authorizing Provider: END, CHRISTOPHER    Ordering User: Janan Ridge   atorvastatin (LIPITOR) 80 MG tablet 90 tablet 0    Sig: Take 1 tablet (80 mg total) by mouth daily.    Authorizing Provider: END, CHRISTOPHER    Ordering User: Janan Ridge

## 2021-04-23 NOTE — Progress Notes (Signed)
°  Subjective:  Patient ID: Margaret [name], female    DOB: 06-28-1970,   MRN: [id number]  No chief complaint on file.   51 y.o. female presents for concern of thickened elongated and painful nails that are difficult to trim. Requesting to have them trimmed today. Relates that she has pain on the big toes in  particular. Has been taking lamisil on and off. Denies any burning or tingling in her feet.   . Denies any other pedal complaints. Denies n/v/f/c.   Past Medical History:  Diagnosis Date   Anxiety    Arthritis    Asthma    controlled   CVA (cerebral vascular accident) (HCC)    a. 09/2016   Depression    Dysrhythmia    Headache    Hypertension    Hypothyroidism    Morbid obesity (HCC)    PAF (paroxysmal atrial fibrillation) (HCC) 09/2016   a. 30-day event monitor 6/18: predominant rhythm of sinus with isolated PACs and PVCs.  Two narrow complex tachycardia episodes concerning for A. Fib +/-atrial flutter or SVT.  No prolonged pauses; b. CHADS2VASc = 3 (stroke x 2, female)--> Eliquis.   PFO (patent foramen ovale)    a. TTE 6/18: EF of 60 to 65%, normal wall motion, grade 1 diastolic dysfunction; b. TEE 6/18: evidence of a very small atrial level right to left shunt by bubble study without visualization of ASD or PFO   Substance abuse (HCC)    Past use of cocaine    Tobacco abuse     Objective:  Physical Exam: Vascular: DP/PT pulses 2/4 bilateral. CFT <3 seconds. Normal hair growth on digits. No edema.  Skin. No lacerations or abrasions bilateral feet. Nails 1-5 are thickened discolored and elongated with subungual debris. Incurvation of bilateral borders of bilateral hallux nails.   Musculoskeletal: MMT 5/5 bilateral lower extremities in DF, PF, Inversion and Eversion. Deceased ROM in DF of ankle joint.  Neurological: Sensation intact to light touch.   Assessment:   1. Pain due to onychomycosis of toenails of both feet   2. Onychodystrophy   3. Nail fungus      Plan:   Patient was evaluated and treated and all questions answered. -Discussed and educated patient on  foot care, especially with regards to the vascular, neurological and musculoskeletal systems.  -Discussed supportive shoes at all times and checking feet regularly.  -Mechanically debrided all nails 1-5 bilateral using sterile nail nipper and filed with dremel without incident  -Did discuss options for removal of partial nail vs total nail. Patient would like nails trimmed and will think about procedures.  -Answered all patient questions -Patient advised to call the office if any problems or questions arise in the meantime.   Louann Sjogren, DPM

## 2021-04-23 NOTE — Telephone Encounter (Signed)
°*  STAT* If patient is at the pharmacy, call can be transferred to refill team.   1. Which medications need to be refilled? (please list name of each medication and dose if known) atorvastatin (LIPITOR) 80 MG tablet  metoprolol tartrate (LOPRESSOR) 25 MG tablet  2. Which pharmacy/location (including street and city if local pharmacy) is medication to be sent to? [facility] [phone] - [place], [address]  3. Do they need a 30 day or 90 day supply? 90 .

## 2021-05-07 ENCOUNTER — Encounter: Payer: Self-pay | Admitting: Physician Assistant

## 2021-05-07 ENCOUNTER — Ambulatory Visit (INDEPENDENT_AMBULATORY_CARE_PROVIDER_SITE_OTHER): Payer: Medicaid Other | Admitting: Physician Assistant

## 2021-05-07 ENCOUNTER — Other Ambulatory Visit: Payer: Self-pay

## 2021-05-07 VITALS — BP 138/98 | HR 69 | Ht 66.0 in | Wt 237.0 lb

## 2021-05-07 DIAGNOSIS — I480 Paroxysmal atrial fibrillation: Secondary | ICD-10-CM | POA: Diagnosis not present

## 2021-05-07 DIAGNOSIS — E785 Hyperlipidemia, unspecified: Secondary | ICD-10-CM

## 2021-05-07 DIAGNOSIS — E039 Hypothyroidism, unspecified: Secondary | ICD-10-CM

## 2021-05-07 DIAGNOSIS — Q2112 Patent foramen ovale: Secondary | ICD-10-CM

## 2021-05-07 DIAGNOSIS — Z8673 Personal history of transient ischemic attack (TIA), and cerebral infarction without residual deficits: Secondary | ICD-10-CM | POA: Diagnosis not present

## 2021-05-07 DIAGNOSIS — Z720 Tobacco use: Secondary | ICD-10-CM

## 2021-05-07 DIAGNOSIS — I10 Essential (primary) hypertension: Secondary | ICD-10-CM | POA: Diagnosis not present

## 2021-05-07 NOTE — Patient Instructions (Signed)
Medication Instructions:  No changes at this time.   *If you need a refill on your cardiac medications before your next appointment, please call your pharmacy*   Lab Work: CBC, CMP, Lipid, LDL, TSH, T4 today   If you have labs (blood work) drawn today and your tests are completely normal, you will receive your results only by: MyChart Message (if you have MyChart) OR A paper copy in the mail If you have any lab test that is abnormal or we need to change your treatment, we will call you to review the results.   Testing/Procedures: None   Follow-Up: At [facility], you and your health needs are our priority.  As part of our continuing mission to provide you with exceptional heart care, we have created designated Provider Care Teams.  These Care Teams include your primary Cardiologist (physician) and Advanced Practice Providers (APPs -  Physician Assistants and Nurse Practitioners) who all work together to provide you with the care you need, when you need it.  We recommend signing up for the patient portal called "MyChart".  Sign up information is provided on this After Visit Summary.  MyChart is used to connect with patients for Virtual Visits (Telemedicine).  Patients are able to view lab/test results, encounter notes, upcoming appointments, etc.  Non-urgent messages can be sent to your provider as well.   To learn more about what you can do with MyChart, go to [email or web address].    Your next appointment:   6 month(s)  The format for your next appointment:   In Person  Provider:   [name], MD or Eula Listen, PA-C

## 2021-05-07 NOTE — Progress Notes (Signed)
Cardiology Office Note    Date:  05/07/2021   ID:  Margaret [name], DOB 15-Jan-1971, MRN [id number]  PCP:  [name], FNP  Cardiologist:  [name], MD  Electrophysiologist:  None   Chief Complaint: Follow up  History of Present Illness:   Margaret [name] is a 51 y.o. female with history of PAF complicated by CVA, patent foramen ovale, COVID in 05/2019, 05/2020 and 03/2021, HTN, HLD, hypothyroidism, asthma, intractable headache, tobacco use, and remote substance abuse with marijuana and cocaine who presents for follow-up of PAF and prior stroke.  She was admitted to the hospital in 09/2016 with a right frontal lobe CVA.  Echo demonstrated an EF of 60 to 65%, normal wall motion, grade 1 diastolic dysfunction, and trivial mitral regurgitation.  Carotid artery ultrasound demonstrated less than 50% stenosis within the bilateral ICAs.  TEE demonstrated a very small atrial level right to left shunt by bubble study without visualization of ASD or PFO.  Subsequent lower extremity ultrasound was negative for DVT bilaterally.  Outpatient cardiac monitoring demonstrated predominantly sinus rhythm with 2 episodes of narrow complex tachycardia with some episodes without clear P waves consistent with possible A. fib versus flutter versus SVT.  She was initiated on Xarelto with subsequent transition to apixaban.  Transcranial Doppler was suggestive of a small Spencer grade 1 right to left shunt.  At her visit in 10/2020 she noted headaches along with right upper arm and shoulder discomfort.  She has been unable to take some of her medications, including apixaban.  She refused ED evaluation.  She left the clinic prior to arranging CT head.  Subsequent CT head showed no acute intracranial process.  At her last visit in 01/2021 she reported some lower ankle swelling, weight loss of 15 pounds, and wanted to repeat another CT head.  Further cranial imaging was felt to be not indicated.  Lower extremity ultrasound  was discussed to evaluate for DVT, though the patient reported compliance with apixaban.  Patient ultimately refused lower extremity imaging and walked out of the office visit.  She comes in doing well from a cardiac perspective.  No chest pain, dyspnea, palpitations, dizziness, presyncope, or syncope.  No new strokelike symptoms.  She reports adherence and tolerance to her cardiac medications.  She has recently relocated to [facility] and is in the process of acquiring a PCP.  In this setting, she does request that we check her thyroid function today.  No falls, hematochezia, or melena.  No significant lower extremity swelling.  No orthopnea.  She does eat a fair amount of salt with her food.  She does continue to smoke tobacco, though is working on tapering.  She does not have any active cardiac issues or concerns at this time.   Labs independently reviewed: 08/2020 - Hgb 11.7, PLT 350, potassium 3.1, BUN 8, serum creatinine 0.65 03/2020 - TSH 9.480, albumin 4.1, AST/ALT normal 01/2020 - A1c 6.1, TC 127, TG 134, HDL 38, LDL 67  Past Medical History:  Diagnosis Date   Anxiety    Arthritis    Asthma    controlled   CVA (cerebral vascular accident) (HCC)    a. 09/2016   Depression    Dysrhythmia    Headache    Hypertension    Hypothyroidism    Morbid obesity (HCC)    PAF (paroxysmal atrial fibrillation) (HCC) 09/2016   a. 30-day event monitor 6/18: predominant rhythm of sinus with isolated PACs and PVCs.  Two narrow  complex tachycardia episodes concerning for A. Fib +/-atrial flutter or SVT.  No prolonged pauses; b. CHADS2VASc = 3 (stroke x 2, female)--> Eliquis.   PFO (patent foramen ovale)    a. TTE 6/18: EF of 60 to 65%, normal wall motion, grade 1 diastolic dysfunction; b. TEE 6/18: evidence of a very small atrial level right to left shunt by bubble study without visualization of ASD or PFO   Substance abuse (HCC)    Past use of cocaine    Tobacco abuse     Past Surgical History:   Procedure Laterality Date   FRACTURE SURGERY     TEE WITHOUT CARDIOVERSION N/A 09/08/2016   Procedure: TRANSESOPHAGEAL ECHOCARDIOGRAM (TEE);  Surgeon: [name], MD;  Location: [facility] ORS;  Service: Cardiovascular;  Laterality: N/A;   XI ROBOTIC ASSISTED VENTRAL HERNIA N/A 09/13/2019   Procedure: XI ROBOTIC Cholecystectomy, with primary non-mesh repair of EPIGATRIC HERNIA;  Surgeon: Campbell Lerner, MD;  Location: [facility] ORS;  Service: General;  Laterality: N/A;    Current Medications: Current Meds  Medication Sig   apixaban (ELIQUIS) 5 MG TABS tablet Take 1 tablet (5 mg total) by mouth 2 (two) times daily.   atorvastatin (LIPITOR) 80 MG tablet Take 1 tablet (80 mg total) by mouth daily.   Cyanocobalamin (B-12 PO) Take 1 tablet by mouth daily.   levothyroxine (SYNTHROID) 125 MCG tablet Take 1 tablet by mouth once a day  1 by mouth daily for hypothyroid   metoprolol tartrate (LOPRESSOR) 25 MG tablet Take 1 tablet (25 mg total) by mouth 2 (two) times daily.   VICTOZA 18 MG/3ML SOPN Inject into the skin.    Allergies:   Patient has no known allergies.   Social History   Socioeconomic History   Marital status: Divorced    Spouse name: Not on file   Number of children: 2   Years of education: 12   Highest education level: Not on file  Occupational History   Occupation: [employer]  Tobacco Use   Smoking status: Every Day    Packs/day: 10.00    Types: Cigarettes   Smokeless tobacco: Former    Types: Snuff   Tobacco comments:    trying to quit; going to start using patches;   Vaping Use   Vaping Use: Never used  Substance and Sexual Activity   Alcohol use: Not Currently    Alcohol/week: 0.0 standard drinks    Comment: on occasion   Drug use: Not Currently    Types: Marijuana    Comment: Previously, but stopped years ago   Sexual activity: Yes    Partners: Male    Birth control/protection: Condom  Other Topics Concern   Not on file  Social History Narrative   Lives  with daughter   Caffeine use: daily   Right handed   Social Determinants of Health   Financial Resource Strain: Not on file  Food Insecurity: Not on file  Transportation Needs: Not on file  Physical Activity: Not on file  Stress: Not on file  Social Connections: Not on file     Family History:  The patient's family history includes Cervical cancer in her maternal grandmother; Heart disease in her maternal grandfather; Hypertension in her daughter; Migraines in her daughter; Stroke in her father and maternal grandfather.  ROS:   Review of Systems  Constitutional:  Negative for chills, diaphoresis, fever, malaise/fatigue and weight loss.  HENT:  Negative for congestion.   Eyes:  Negative for discharge and redness.  Respiratory:  Negative for cough, sputum production, shortness of breath and wheezing.   Cardiovascular:  Negative for chest pain, palpitations, orthopnea, claudication, leg swelling and PND.  Gastrointestinal:  Negative for abdominal pain, blood in stool, heartburn, melena, nausea and vomiting.  Musculoskeletal:  Negative for falls and myalgias.  Skin:  Negative for rash.  Neurological:  Negative for dizziness, tingling, tremors, sensory change, speech change, focal weakness, loss of consciousness and weakness.  Endo/Heme/Allergies:  Does not bruise/bleed easily.  Psychiatric/Behavioral:  Negative for substance abuse. The patient is not nervous/anxious.   All other systems reviewed and are negative.   EKGs/Labs/Other Studies Reviewed:    Studies reviewed were summarized above. The additional studies were reviewed today:  Transcranial Doppler 10/2016: Summary:  High intensity transient signals (HITS) were heard with Valsalva,  suggestive of a small Spencer grade I right to left shunt  __________  Outpatient event monitor 09/2016: The patient was enrolled for 30 days. The predominant rhythm was sinus. Isolated PACs and PVCs were identified. 2 episodes of narrow  complex tachycardia were identified on 10/02/16 with heart rates up to 230 bpm. While some portions appear regular, there are other periods of irregularity without clear P waves consistent with atrial fibrillation +/- atrial flutter or SVT. No prolonged pauses were identified. Patient triggered events corresponded to narrow complex tachycardia consistent with atrial fibrillation +/- atrial flutter/SVT.   Predominantly sinus rhythm with paroxysmal atrial fibrillation +/- atrial flutter/SVT.   Narrow complex tachycardia was discussed with the patient and treatment for paroxysmal atrial fibrillation initiated while the patient was still being monitored. __________  TEE 09/2016: - Left ventricle: The cavity size was normal. Wall thickness was    normal. Systolic function was normal.  - Aortic valve: No evidence of vegetation. There was trivial    regurgitation.  - Mitral valve: No evidence of vegetation. There was mild    regurgitation.  - Left atrium: No evidence of thrombus in the atrial cavity or    appendage.  - Right atrium: No evidence of thrombus in the atrial cavity or    appendage. No evidence of thrombus in the appendage.  - Atrial septum: Agitated saline contrast study showed a very small    right-to-left shunt, at baseline or with provocation. Though no    PFO or ASD is clear identified, a tiny shunt cannot be excluded    based on early appearance of a small number of bubbles in the    left ventricle.  - Tricuspid valve: No evidence of vegetation.  - Pulmonic valve: No evidence of vegetation.   Impressions:   - Evidence of very small atrial level right-to-left shunt by bubble    study without visualization of ASD or PFO.  __________  2D echo 09/2016: - Left ventricle: The cavity size was normal. Wall thickness was    normal. Systolic function was normal. The estimated ejection    fraction was in the range of 60% to 65%. Wall motion was normal;    there were no regional wall  motion abnormalities. Doppler    parameters are consistent with abnormal left ventricular    relaxation (grade 1 diastolic dysfunction).   Impressions:   - No cardiac source of emboli was indentified. __________  Carotid artery ultrasound 09/2016: IMPRESSION: Less than 50% stenosis in the right and left internal carotid arteries.   Multiple thyroid nodules are noted. The largest measures 2.2 cm. Dedicated thyroid ultrasound is recommended.    EKG:  EKG is ordered today.  The EKG ordered today demonstrates NSR, 69 bpm, nonspecific ST-T changes, no significant change when compared to prior tracing  Recent Labs: 08/24/2020: BUN 8; Creatinine, Ser 0.65; Hemoglobin 11.7; Platelets 350; Potassium 3.1; Sodium 139  Recent Lipid Panel    Component Value Date/Time   CHOL 127 01/22/2020 1107   CHOL 135 02/24/2018 1130   CHOL 140 09/04/2013 0405   TRIG 134 01/22/2020 1107   TRIG 137 09/04/2013 0405   HDL 38 (L) 01/22/2020 1107   HDL 37 (L) 02/24/2018 1130   HDL 30 (L) 09/04/2013 0405   CHOLHDL 3.3 01/22/2020 1107   VLDL 11 09/07/2016 0442   VLDL 27 09/04/2013 0405   LDLCALC 67 01/22/2020 1107   LDLCALC 83 09/04/2013 0405    PHYSICAL EXAM:    VS:  BP (!) 138/98 (BP Location: Left Arm, Patient Position: Sitting, Cuff Size: Large)    Pulse 69    Ht 5\' 6"  (1.676 m)    Wt 237 lb (107.5 kg)    LMP 05/07/2016    SpO2 98%    BMI 38.25 kg/m   BMI: Body mass index is 38.25 kg/m.  Physical Exam Vitals reviewed.  Constitutional:      Appearance: She is well-developed.  HENT:     Head: Normocephalic and atraumatic.  Eyes:     General:        Right eye: No discharge.        Left eye: No discharge.  Neck:     Vascular: No JVD.  Cardiovascular:     Rate and Rhythm: Normal rate and regular rhythm.     Heart sounds: Normal heart sounds, S1 normal and S2 normal. Heart sounds not distant. No midsystolic click and no opening snap. No murmur heard.   No friction rub.  Pulmonary:      Effort: Pulmonary effort is normal. No respiratory distress.     Breath sounds: Normal breath sounds. No decreased breath sounds, wheezing or rales.  Chest:     Chest wall: No tenderness.  Abdominal:     General: There is no distension.     Palpations: Abdomen is soft.     Tenderness: There is no abdominal tenderness.  Musculoskeletal:     Cervical back: Normal range of motion.     Right lower leg: No edema.     Left lower leg: No edema.  Skin:    General: Skin is warm and dry.     Nails: There is no clubbing.  Neurological:     Mental Status: She is alert and oriented to person, place, and time.  Psychiatric:        Speech: Speech normal.        Behavior: Behavior normal.        Thought Content: Thought content normal.        Judgment: Judgment normal.    Wt Readings from Last 3 Encounters:  05/07/21 237 lb (107.5 kg)  01/24/21 236 lb (107 kg)  10/24/20 244 lb (110.7 kg)     ASSESSMENT & PLAN:   PAF: Maintaining sinus rhythm.  Continue Lopressor 25 mg bid.  CHA2DS2-VASc at least 4 (HTN, CVA x2, sex category).  Continue apixaban 5 mg bid.  No symptoms concerning for bleeding.  Check CBC and CMP.    History of CVA: No new stroke like symptoms or deficits.  She should continue indefinite OAC in the setting of PAF and PFO.  Follow up with PCP.   PFO: Noted on TEE at  time of CVA in 2018.  Lower extremity ultrasound negative for DVT at that time.  No plans for closure.  She remains on indefinite OAC.   HTN: Blood pressure is mildly elevated in the office today.  Typically, this is well controlled in the office.  She does consume a diet high in salt.  She was encouraged to reduce salt intake.  She remains on Lopressor as outlined above.  If blood pressure remains elevated in follow-up consider medication titration.  HLD: LDL 67 in 01/2020.  She remains on Lipitor 80 mg.  Check LFT, lipid, and direct LDL.   Hypothyroidism/thyroid nodules: She is currently in between PCPs.  She  request that we check her thyroid function with lab work today.  I have agreed to do so, though have indicated I will not be able to adjust her levothyroxine if this is indicated.  She is agreeable to this plan.  Follow up with PCP.   Tobacco use: She is working on tapering.  Congratulations were offered.  Complete cessation is encouraged.      Disposition: F/u with Dr. Okey DupreEnd or an APP in 6 months.   Medication Adjustments/Labs and Tests Ordered: Current medicines are reviewed at length with the patient today.  Concerns regarding medicines are outlined above. Medication changes, Labs and Tests ordered today are summarized above and listed in the Patient Instructions accessible in Encounters.   Signed, Eula Listenyan Scottlynn Lindell, PA-C 05/07/2021 3:03 PM     [facility] [place] [address] [place], [place] [address] [phone]

## 2021-05-08 LAB — COMPREHENSIVE METABOLIC PANEL
ALT: 16 IU/L (ref 0–32)
AST: 23 IU/L (ref 0–40)
Albumin/Globulin Ratio: 1.5 (ref 1.2–2.2)
Albumin: 4.1 g/dL (ref 3.8–4.8)
Alkaline Phosphatase: 111 IU/L (ref 44–121)
BUN/Creatinine Ratio: 6 — ABNORMAL LOW (ref 9–23)
BUN: 5 mg/dL — ABNORMAL LOW (ref 6–24)
Bilirubin Total: 0.5 mg/dL (ref 0.0–1.2)
CO2: 26 mmol/L (ref 20–29)
Calcium: 9.6 mg/dL (ref 8.7–10.2)
Chloride: 101 mmol/L (ref 96–106)
Creatinine, Ser: 0.84 mg/dL (ref 0.57–1.00)
Globulin, Total: 2.8 g/dL (ref 1.5–4.5)
Glucose: 91 mg/dL (ref 70–99)
Potassium: 3.9 mmol/L (ref 3.5–5.2)
Sodium: 142 mmol/L (ref 134–144)
Total Protein: 6.9 g/dL (ref 6.0–8.5)
eGFR: 85 mL/min/{1.73_m2} (ref >59)

## 2021-05-08 LAB — LIPID PANEL
Chol/HDL Ratio: 4.1 ratio (ref 0.0–4.4)
Cholesterol, Total: 142 mg/dL (ref 100–199)
HDL: 35 mg/dL — ABNORMAL LOW (ref >39)
LDL Chol Calc (NIH): 80 mg/dL (ref 0–99)
Triglycerides: 157 mg/dL — ABNORMAL HIGH (ref 0–149)
VLDL Cholesterol Cal: 27 mg/dL (ref 5–40)

## 2021-05-08 LAB — CBC
Hematocrit: 36.9 % (ref 34.0–46.6)
Hemoglobin: 12.3 g/dL (ref 11.1–15.9)
MCH: 27.2 pg (ref 26.6–33.0)
MCHC: 33.3 g/dL (ref 31.5–35.7)
MCV: 82 fL (ref 79–97)
Platelets: 369 10*3/uL (ref 150–450)
RBC: 4.53 x10E6/uL (ref 3.77–5.28)
RDW: 14.2 % (ref 11.7–15.4)
WBC: 9.3 10*3/uL (ref 3.4–10.8)

## 2021-05-08 LAB — TSH: TSH: 3.86 u[IU]/mL (ref 0.450–4.500)

## 2021-05-08 LAB — LDL CHOLESTEROL, DIRECT: LDL Direct: 80 mg/dL (ref 0–99)

## 2021-05-08 LAB — T4, FREE: Free T4: 1.41 ng/dL (ref 0.82–1.77)

## 2021-05-12 ENCOUNTER — Telehealth: Payer: Self-pay | Admitting: *Deleted

## 2021-05-12 NOTE — Telephone Encounter (Signed)
Patient returning call.

## 2021-05-12 NOTE — Telephone Encounter (Signed)
Reviewed results and recommendations with patient. She reports missing some days on the medication. Instructed her to try and adhere to a daily basis and then we can see if it improves and if not then we can try adding another medication. She verbalized understanding with no further questions at this time.

## 2021-05-12 NOTE — Telephone Encounter (Signed)
-----   Message from Sondra Barges, PA-C sent at 05/09/2021 11:30 AM EST ----- Random glucose okay Renal function normal Liver function normal Potassium improved and nearly at goal Thyroid function normal Blood count normal Triglycerides mildly elevated, though this was a nonfasting sample HDL mildly low LDL is mildly elevated at 80 with goal being less than 70  Recommendations: She reported adherence to all medications at her recent visit Please ensure she has been taking atorvastatin 80 mg on a daily basis, if not recommend she do so.  If she has been taking this consistently recommend she improve heart healthy diet and add ezetimibe 10 mg daily with a follow-up fasting lipid panel and LFT in 2 to 3 months

## 2021-05-12 NOTE — Telephone Encounter (Signed)
Left voicemail message to call back for review of results and recommendations.  

## 2021-05-12 NOTE — Telephone Encounter (Signed)
Left voicemail message to call back for review of results.  

## 2021-05-29 ENCOUNTER — Telehealth: Payer: Self-pay | Admitting: Internal Medicine

## 2021-05-29 NOTE — Telephone Encounter (Signed)
Discussed that this is a non cardiac med with patient and patient understood from Dr. Okey Dupre that he would fill it given he ordered the lab to check her TSH.   Previous unrelated discussions with patient difficult to navigate .  Please call to discuss issue below .

## 2021-05-29 NOTE — Telephone Encounter (Signed)
°*  STAT* If patient is at the pharmacy, call can be transferred to refill team.   1. Which medications need to be refilled? (please list name of each medication and dose if known) levothyroxine   2. Which pharmacy/location (including street and city if local pharmacy) is medication to be sent to? [facility] on [address]   3. Do they need a 30 day or 90 day supply? 90

## 2021-05-29 NOTE — Telephone Encounter (Signed)
Patient will need to contact PCP for refill as this is not a Cardiac medication.

## 2021-05-30 MED ORDER — LEVOTHYROXINE SODIUM 125 MCG PO TABS: 125.0000 ug | ORAL_TABLET | Freq: Every day | ORAL | 1 refills | Status: DC

## 2021-05-30 NOTE — Telephone Encounter (Signed)
Called and spoke to pt and notified of message below.  Notified that we have sent refill of 30 day supply with 1 refill of levothyroxine 125 mcg daily to (pt confirmed pharmacy [facility] on [address] in [place]).   Pt voiced understanding that she will need to establish with PCP for further refills and for continued management of thyroid medication.   Pt has no further questions or needs at this time.

## 2021-05-30 NOTE — Telephone Encounter (Signed)
Ok to refill a 30 day supply with 1 refill of levothyroxine 125 mcg daily (what is has been on). We will not be able to authorize any further refills and she will need to establish with a PCP for continued management of this.

## 2021-05-30 NOTE — Telephone Encounter (Signed)
Patient calling to check status of refill because she "already told us she was out of meds"   Patient advised unable to send refill as previously discussed on initial call and RN/MD would need to review and call to discuss.

## 2021-06-23 IMAGING — CT CT ABD-PELV W/ CM
2 of 5 series · 15 of 46 positions shown, 17 images · IV contrast (APPLIED)
Comparison: CT 09/07/2019

CLINICAL DATA: Epigastric pain

EXAM:
CT ABDOMEN AND PELVIS WITH CONTRAST
TECHNIQUE: Multidetector CT imaging of the abdomen and pelvis was performed
using the standard protocol following bolus administration of
intravenous contrast.
CONTRAST:  100mL OMNIPAQUE IOHEXOL 300 MG/ML  SOLN

[Series 2: routine abd/pel with · axial · 0.90mm/px · z∈[-323,+122]mm · 12 of 101 slices shown, 14 images]
[im 6/101  soft-tissue]
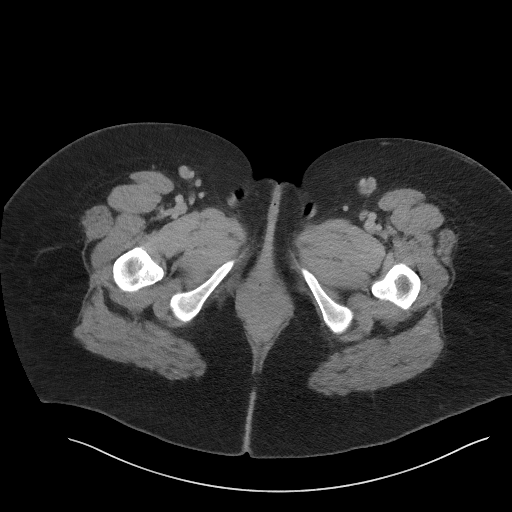
[im 6/101  bone]
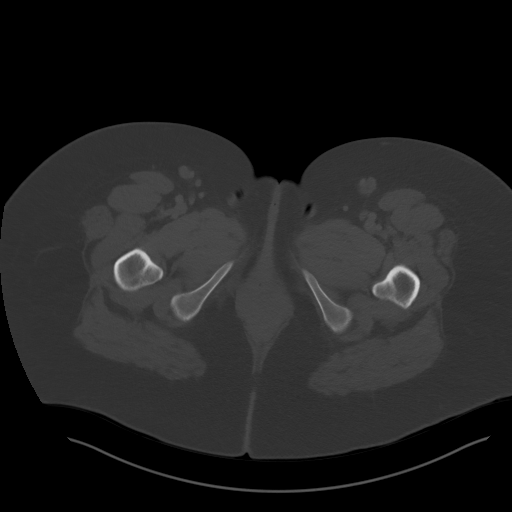
[im 18/101  soft-tissue]
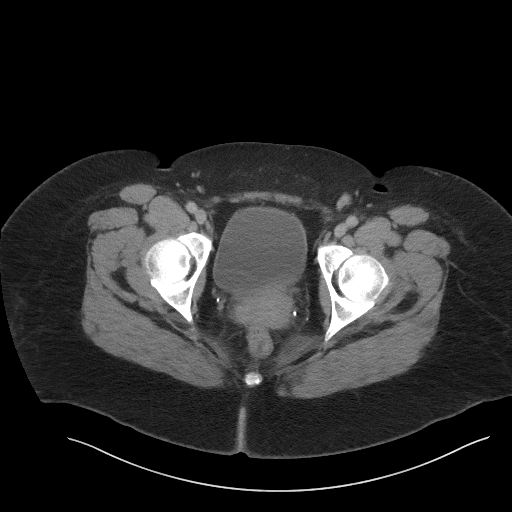
[im 24/101  soft-tissue]
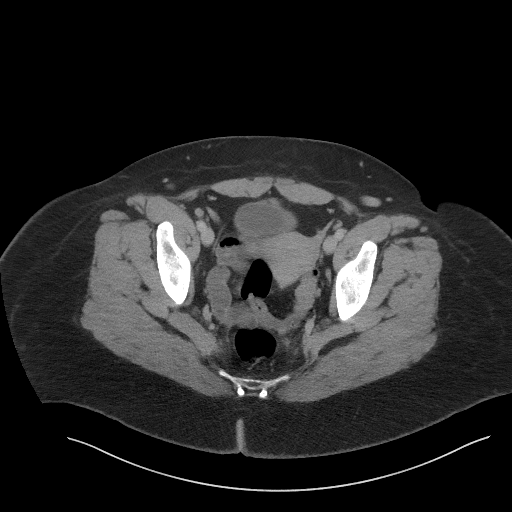
[im 30/101  soft-tissue]
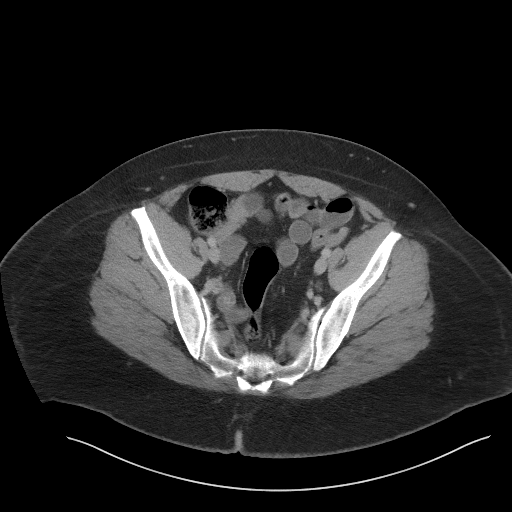
[im 42/101  soft-tissue]
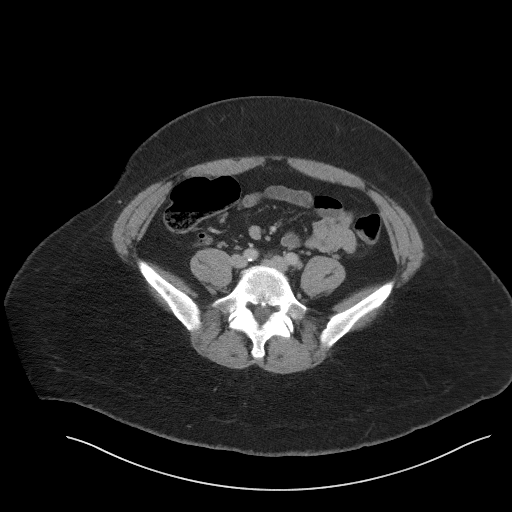
[im 48/101  soft-tissue]
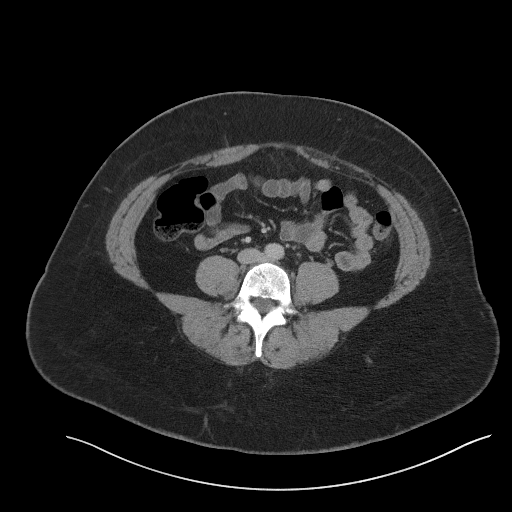
[im 53/101  soft-tissue]
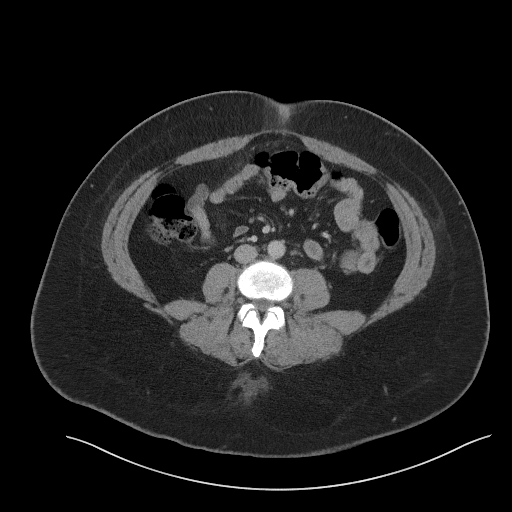
[im 65/101  soft-tissue]
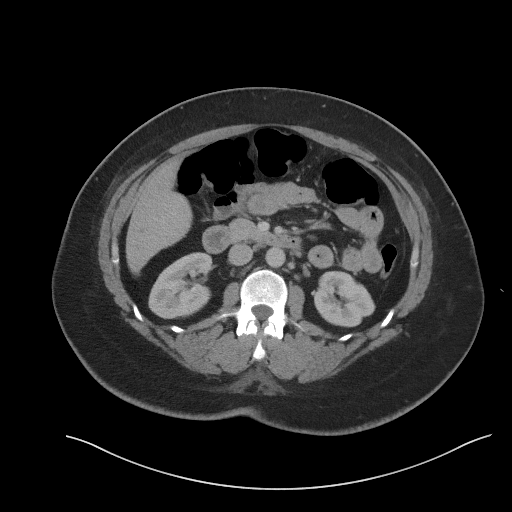
[im 71/101  soft-tissue]
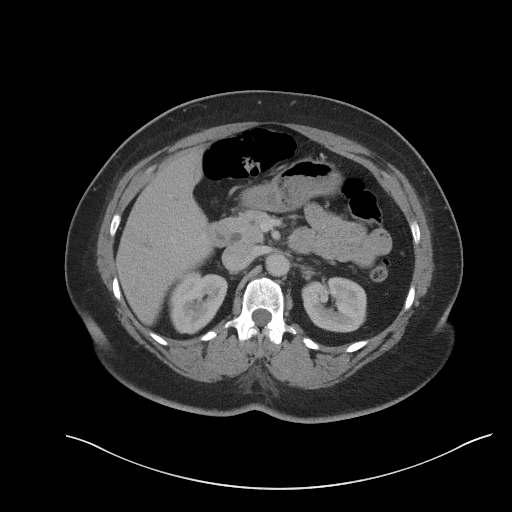
[im 71/101  bone]
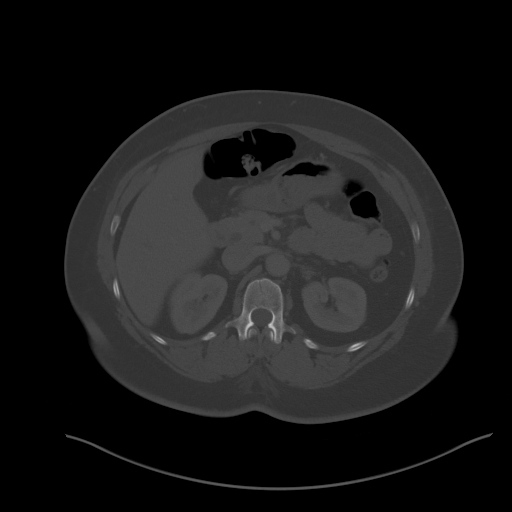
[im 77/101  soft-tissue]
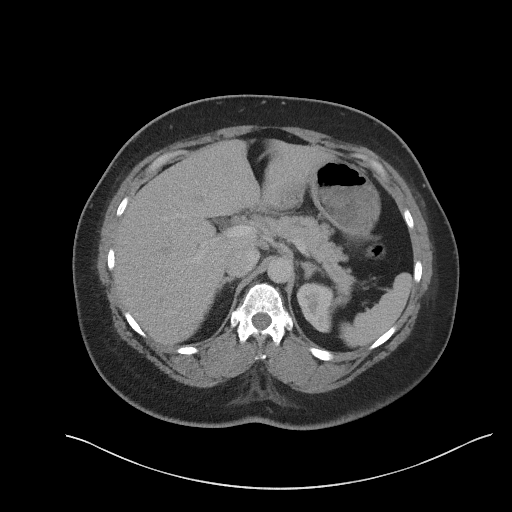
[im 89/101  soft-tissue]
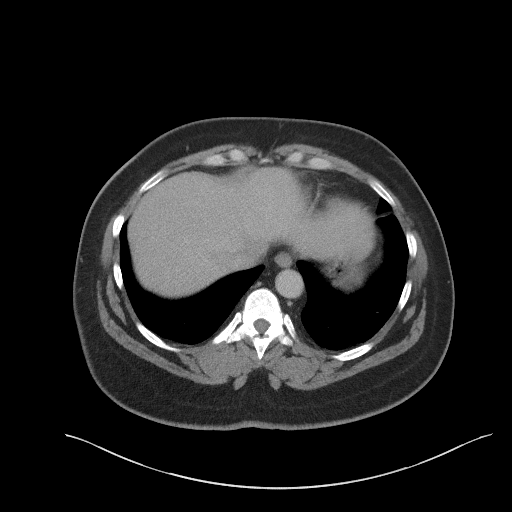
[im 95/101  soft-tissue]
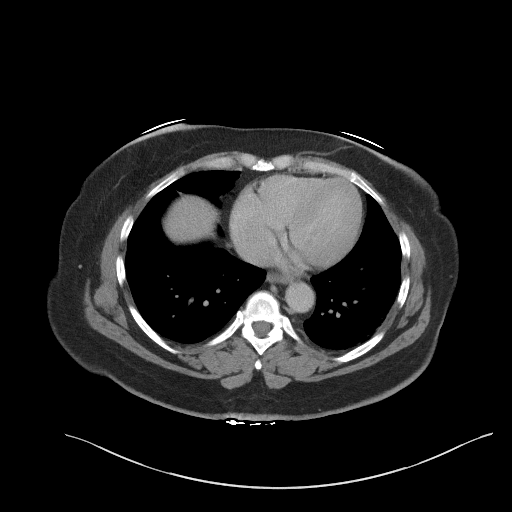

[Series 5: coronal st · coronal · 0.68mm/px · 3 of 100 slices shown]
[im 34/100  soft-tissue]
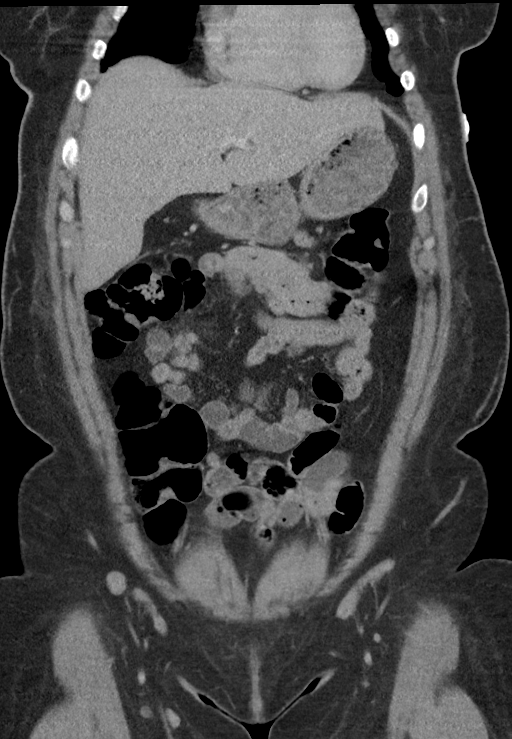
[im 45/100  soft-tissue]
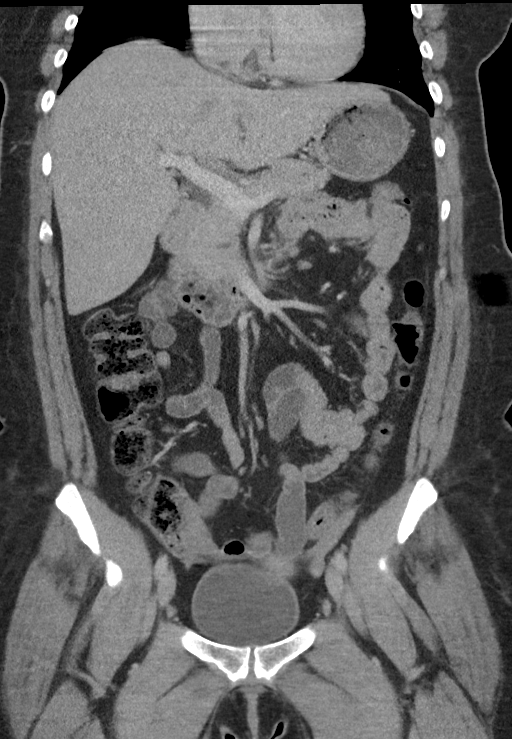
[im 56/100  soft-tissue]
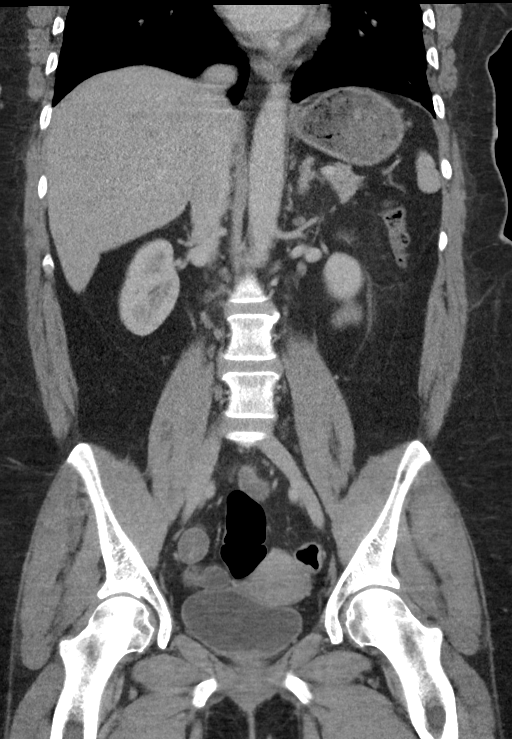

[15 of 46 positions shown; findings below may reference images not displayed]

FINDINGS: Lower chest: Lung bases demonstrate no acute consolidation or
effusion. Normal cardiac size.

Hepatobiliary: Gallbladder not identified and is presumed surgically
absent. No focal hepatic abnormality. No biliary dilatation.

Pancreas: Unremarkable. No pancreatic ductal dilatation or
surrounding inflammatory changes.

Spleen: Normal in size without focal abnormality.

Adrenals/Urinary Tract: Adrenal glands are unremarkable. Kidneys are
normal, without renal calculi, focal lesion, or hydronephrosis.
Bladder is unremarkable.

Stomach/Bowel: Stomach is within normal limits. Appendix appears
normal. No evidence of bowel wall thickening, distention, or
inflammatory changes.

Vascular/Lymphatic: Mild aortic atherosclerosis. No aneurysm. No
suspicious nodes

Reproductive: Uterus and bilateral adnexa are unremarkable.

Other: Negative for free air or free fluid. Small moderate
supraumbilical fat containing ventral hernia with soft tissue
stranding and fluid in the hernia sac suspicious for incarceration.
Transverse fascial defect measures 2 cm.

Musculoskeletal: No acute or significant osseous findings.
IMPRESSION: 1. Small moderate supraumbilical fat containing ventral hernia.
There is fluid and focal inflammatory process in the hernia sac
suggesting incarcerated fat hernia.
2. Otherwise no CT evidence for acute intra-abdominal or pelvic
abnormality

Aortic Atherosclerosis ([id number]).

## 2021-07-17 ENCOUNTER — Ambulatory Visit: Payer: Medicaid Other | Admitting: Podiatry

## 2021-07-28 ENCOUNTER — Ambulatory Visit (INDEPENDENT_AMBULATORY_CARE_PROVIDER_SITE_OTHER): Payer: Medicaid Other | Admitting: Podiatry

## 2021-07-28 DIAGNOSIS — L600 Ingrowing nail: Secondary | ICD-10-CM | POA: Diagnosis not present

## 2021-07-28 MED ORDER — GENTAMICIN SULFATE 0.1 % EX CREA: 1.0000 "application " | TOPICAL_CREAM | Freq: Two times a day (BID) | CUTANEOUS | 1 refills | Status: DC

## 2021-07-28 NOTE — Patient Instructions (Addendum)

## 2021-07-28 NOTE — Progress Notes (Signed)
? ?  Subjective: ?Patient presents today for evaluation of pain to the lateral border left great toe. Patient is concerned for possible ingrown nail.  It is very sensitive to touch.  Patient presents today for further treatment and evaluation. ? ?Past Medical History:  ?Diagnosis Date  ? Anxiety   ? Arthritis   ? Asthma   ? controlled  ? CVA (cerebral vascular accident) [facility])   ? a. 09/2016  ? Depression   ? Dysrhythmia   ? Headache   ? Hypertension   ? Hypothyroidism   ? Morbid obesity (HCC)   ? PAF (paroxysmal atrial fibrillation) (HCC) 09/2016  ? a. 30-day event monitor 6/18: predominant rhythm of sinus with isolated PACs and PVCs.  Two narrow complex tachycardia episodes concerning for A. Fib +/-atrial flutter or SVT.  No prolonged pauses; b. CHADS2VASc = 3 (stroke x 2, female)--> Eliquis.  ? PFO (patent foramen ovale)   ? a. TTE 6/18: EF of 60 to 65%, normal wall motion, grade 1 diastolic dysfunction; b. TEE 6/18: evidence of a very small atrial level right to left shunt by bubble study without visualization of ASD or PFO  ? Substance abuse (HCC)   ? Past use of cocaine   ? Tobacco abuse   ? ? ?Objective:  ?General: Well developed, nourished, in no acute distress, alert and oriented x3  ? ?Dermatology: Skin is warm, dry and supple bilateral.  Lateral border left great toe appears to be erythematous with evidence of an ingrowing nail. Pain on palpation noted to the border of the nail fold. The remaining nails appear unremarkable at this time. There are no open sores, lesions. ? ?Vascular: Dorsalis Pedis artery and Posterior Tibial artery pedal pulses palpable. No lower extremity edema noted.  ? ?Neruologic: Grossly intact via light touch bilateral. ? ?Musculoskeletal: Muscular strength within normal limits in all groups bilateral. Normal range of motion noted to all pedal and ankle joints.  ? ?Assesement: ?#1 Paronychia with ingrowing nail lateral border left great toe ?#2 Pain in toe ? ?Plan of Care:  ?1. Patient  evaluated.  ?2. Discussed treatment alternatives and plan of care. Explained nail avulsion procedure and post procedure course to patient. ?3. Patient opted for permanent partial nail avulsion of the ingrown portion of the nail.  ?4. Prior to procedure, local anesthesia infiltration utilized using 3 ml of a 50:50 mixture of 2% plain lidocaine and 0.5% plain marcaine in a normal hallux block fashion and a betadine prep performed.  ?5. Partial permanent nail avulsion with chemical matrixectomy performed using 3x30sec applications of phenol followed by alcohol flush.  ?6. Light dressing applied.  Post care instructions provided ?7.  Prescription for gentamicin 2% cream  ?8.  Return to clinic 2 weeks. ? ?Felecia Shelling, DPM ?[facility] ? ?Dr. Felecia Shelling, DPM  ?  ?[address]                                       ?[place], [place] [address]                ?Office [phone]  ?Fax [phone] ? ? ? ? ?

## 2021-08-05 ENCOUNTER — Encounter (HOSPITAL_COMMUNITY): Payer: Self-pay | Admitting: Registered Nurse

## 2021-08-05 ENCOUNTER — Ambulatory Visit (HOSPITAL_COMMUNITY)
Admission: EM | Admit: 2021-08-05 | Discharge: 2021-08-05 | Disposition: A | Discharge status: Home / Self Care | Payer: Medicaid Other | Attending: Registered Nurse | Admitting: Registered Nurse

## 2021-08-05 DIAGNOSIS — G4700 Insomnia, unspecified: Secondary | ICD-10-CM | POA: Insufficient documentation

## 2021-08-05 DIAGNOSIS — F4323 Adjustment disorder with mixed anxiety and depressed mood: Secondary | ICD-10-CM | POA: Insufficient documentation

## 2021-08-05 NOTE — Discharge Instructions (Addendum)
? ? ?  Walk in hours for medication management ?Monday, Wednesday, Thursday, and Friday from 8:00 AM to 11:00 AM ?Recommend arriving by 7:30 AM.  It is first come first serve.   ? ?Walk in hours for therapy intake ?Monday and Wednesday only 8:00 AM to 11:00 AM Encouraged to arrive by 7:30 AM.  It is first come first serve  ? ? ?  ?Over the Counter ? ?Diphenhydramine (Benadryl) has been used as to help with Sleep.  Read instructions ? ?Melatonin is also used for sleep assistance ?

## 2021-08-05 NOTE — BH Assessment (Signed)
Patient is a 51 year old female that presents this date "feeling overwhelmed about everything." Patient denies any S/I, H/I or AVH. Patient denies any previous mental health diagnosis or history of medication interventions. Patient states she has been maintaining her sobriety for the last ten years from cocaine although states she has "slipped a little" and started using Cannabis "just a couple times" since she has been "feeling down" for the last three months. Patient reports she has been going through menopause and "not feeling herself" stating she only sleeps a few hours a night (patient is unsure of how many hours) and feels like she "just has no energy." Patient is employed in the health care field (patient care) and states she is so exhausted that she needs some assistance "getting it together." Patient is requesting possible medication interventions and counseling.   ?

## 2021-08-05 NOTE — ED Provider Notes (Signed)
Behavioral Health Urgent Care Medical Screening Exam ? ?Patient Name: Margaret [name] ?MRN: [id number] ?Date of Evaluation: 08/05/21 ?Chief Complaint:   ?Diagnosis:  ?Final diagnoses:  ?Adjustment disorder with mixed anxiety and depressed mood  ?Insomnia, unspecified type  ? ? ?History of Present illness: Margaret [name] is a 51 y.o. female patient present to [facility] as a walk in with complaints of feeling overwhelmed and wanting to get resources for outpatient psychiatric services (medication management and therapy).    ? ?[name], 51 y.o., female patient seen face to face by this provider, consulted with Dr. Earlene Plater; and chart reviewed on 08/05/21.  On evaluation Margaret [name] reports she has been feeling overwhelmed.  States she is going through menopause and just not feeling herself.  Reports he has a history of substance use disorder and has been clean for 10 years but ?slipped a little? using cannabis a couple of times which has caused her to feel ?down? for the last 3 months.  Patient denies any other psychiatric history.  Patient also reports she has been having trouble staying asleep but is unsure how many hours she is sleeping a night.  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  Patient is requesting outpatient psychiatric services for medication management and therapy.  ?During evaluation Margaret [name] is sitting upright in chair in no acute distress.  She is alert/oriented x 4; calm/cooperative; and mood congruent with affect.  She is speaking in a clear tone at moderate volume, and normal pace; with good eye contact.  Her thought process is coherent and relevant; There is no indication that she is currently responding to internal/external stimuli or experiencing delusional thought content; and she denies suicidal/self-harm/homicidal ideation, psychosis, and paranoia.  Patient has remained calm throughout assessment and has answered questions  appropriately.   ? ?At this time Margaret [name] is educated and verbalizes understanding of mental health resources and other crisis services in the community.  She is instructed to call 911 and present to the nearest emergency room should she experience any suicidal/homicidal ideation, auditory/visual/hallucinations, or detrimental worsening of her mental health condition.  She was a also advised by Clinical research associate that she could call the toll-free phone on back of Medicaid card to speak with care coordinator ?  ? ?Psychiatric Specialty Exam ? ?Presentation  ?General Appearance:Appropriate for Environment ? ?Eye Contact:Good ? ?Speech:Clear and Coherent; Normal Rate ? ?Speech Volume:No data recorded ?Handedness:Right ? ? ?Mood and Affect  ?Mood:Depressed ? ?Affect:Congruent; Depressed ? ? ?Thought Process  ?Thought Processes:Coherent; Goal Directed ? ?Descriptions of Associations:Intact ? ?Orientation:Full (Time, Place and Person) ? ?Thought Content:No data recorded   Hallucinations:None ? ?Ideas of Reference:None ? ?Suicidal Thoughts:No ? ?Homicidal Thoughts:No ? ? ?Sensorium  ?Memory:Immediate Good; Recent Good ? ?Judgment:Intact ? ?Insight:Present ? ? ?Executive Functions  ?Concentration:Good ? ?Attention Span:Good ? ?Recall:Good ? ?Fund of Knowledge:Good ? ?Language:Good ? ? ?Psychomotor Activity  ?Psychomotor Activity:Normal ? ? ?Assets  ?Assets:Communication Skills; Desire for Improvement; Financial Resources/Insurance; Housing; Resilience; Social Support ? ? ?Sleep  ?Sleep:Fair ? ?Number of hours: No data recorded ? ?Nutritional Assessment (For OBS and FBC admissions only) ?Has the patient had a weight loss or gain of 10 pounds or more in the last 3 months?: No ?Has the patient had a decrease in food intake/or appetite?: No ?Does the patient have dental problems?: No ?Does the patient have eating habits or behaviors that may be indicators of an eating  disorder including binging or inducing vomiting?: No ?Has the  patient recently lost weight without trying?: 0 ?Has the patient been eating poorly because of a decreased appetite?: 0 ?Malnutrition Screening Tool Score: 0 ? ? ? ?Physical Exam: ?Physical Exam ?Vitals and nursing note reviewed. Exam conducted with a chaperone present.  ?Constitutional:   ?   General: She is not in acute distress. ?   Appearance: Normal appearance. She is not ill-appearing.  ?Cardiovascular:  ?   Rate and Rhythm: Normal rate.  ?Pulmonary:  ?   Effort: Pulmonary effort is normal.  ?Skin: ?   General: Skin is warm and dry.  ?Neurological:  ?   Mental Status: She is alert and oriented to person, place, and time.  ?Psychiatric:     ?   Attention and Perception: Attention and perception normal. She does not perceive auditory or visual hallucinations.     ?   Mood and Affect: Mood is depressed.     ?   Speech: Speech normal.     ?   Behavior: Behavior normal. Behavior is cooperative.     ?   Thought Content: Thought content normal. Thought content is not paranoid or delusional. Thought content does not include homicidal or suicidal ideation.     ?   Cognition and Memory: Cognition normal.     ?   Judgment: Judgment normal.  ? ?Review of Systems  ?Constitutional:  Positive for malaise/fatigue.  ?HENT: Negative.    ?Eyes: Negative.   ?Respiratory: Negative.    ?Cardiovascular: Negative.   ?Gastrointestinal: Negative.   ?Genitourinary: Negative.   ?Musculoskeletal: Negative.   ?Skin: Negative.   ?Neurological: Negative.   ?Endo/Heme/Allergies: Negative.   ?Psychiatric/Behavioral:  Positive for depression and substance abuse (Recently relapsed with cannabis). Hallucinations: Denies. Suicidal ideas: Denies.The patient is nervous/anxious (Feeling overwhelmed) and has insomnia (Trouble staying sleep).   ?Blood pressure 138/89, pulse 80, temperature 98.9 ?F (37.2 ?C), temperature source Oral, resp. rate 18, last menstrual period 05/07/2016, SpO2 100 %. There is no height or weight on file to calculate  BMI. ? ?Musculoskeletal: ?Strength & Muscle Tone: within normal limits ?Gait & Station: normal ?Patient leans: N/A ? ? ?[facility] Disposition for Follow up and Recommendations: ?Based on my evaluation the patient does not appear to have an emergency medical condition and can be discharged with resources and follow up care in outpatient services for Medication Management, Substance Abuse Intensive Outpatient Program, and Individual Therapy ? ? ? Follow-up Information   ? ? Schedule an appointment as soon as possible for a visit  [facility]   ?Specialty: Professional Counselor ?Why: schedule an appointment for medication management and therapy ?Contact information: ?[facility] ?[address] ?[place] [address] ?[phone] ? ? ?  ?  ? ? Schedule an appointment as soon as possible for a visit  with [facility].   ?Why: for medication management and therapy ?Contact information: ?[address] ?[address] ?[place] [address] ?[phone] ? ? ?  ?  ? ?  ?  ? ?  ?  ? ? ?Discharge Instructions   ? ?  ? ? ? ?Walk in hours for medication management ?Monday, Wednesday, Thursday, and Friday from 8:00 AM to 11:00 AM ?Recommend arriving by 7:30 AM.  It is first come first serve.   ? ?Walk in hours for therapy intake ?Monday and Wednesday only 8:00 AM to 11:00 AM Encouraged to arrive by  7:30 AM.  It is first come first serve  ? ? ?  ?Over the Counter ? ?Diphenhydramine (Benadryl) has been used as to help with Sleep.  Read instructions ? ?Melatonin is also used for sleep assistance ? ? ?  ? ? ? ?Jayshawn Colston, NP ?08/05/2021, 5:53 PM ? ?

## 2021-08-05 NOTE — ED Notes (Signed)
Pt discharged with  AVS.  AVS reviewed prior to discharge.  Pt alert, oriented, and ambulatory.  Safety maintained.  °

## 2021-08-18 ENCOUNTER — Ambulatory Visit (INDEPENDENT_AMBULATORY_CARE_PROVIDER_SITE_OTHER): Payer: Medicaid Other | Admitting: Podiatry

## 2021-08-18 DIAGNOSIS — L600 Ingrowing nail: Secondary | ICD-10-CM | POA: Diagnosis not present

## 2021-08-18 NOTE — Progress Notes (Signed)
? ?  Subjective: ?51 y.o. female presents today status post permanent nail avulsion procedure of the lateral border left great toe that was performed on 07/28/2021.  Patient states that she is doing well.  She has been applying antibiotic cream and soaking her foot as instructed.  No new complaints at this time.  ? ?Past Medical History:  ?Diagnosis Date  ? Anxiety   ? Arthritis   ? Asthma   ? controlled  ? CVA (cerebral vascular accident) [facility])   ? a. 09/2016  ? Depression   ? Dysrhythmia   ? Headache   ? Hypertension   ? Hypothyroidism   ? Morbid obesity (HCC)   ? PAF (paroxysmal atrial fibrillation) (HCC) 09/2016  ? a. 30-day event monitor 6/18: predominant rhythm of sinus with isolated PACs and PVCs.  Two narrow complex tachycardia episodes concerning for A. Fib +/-atrial flutter or SVT.  No prolonged pauses; b. CHADS2VASc = 3 (stroke x 2, female)--> Eliquis.  ? PFO (patent foramen ovale)   ? a. TTE 6/18: EF of 60 to 65%, normal wall motion, grade 1 diastolic dysfunction; b. TEE 6/18: evidence of a very small atrial level right to left shunt by bubble study without visualization of ASD or PFO  ? Substance abuse (HCC)   ? Past use of cocaine   ? Tobacco abuse   ? ? ?Objective: ?Skin is warm, dry and supple. Nail and respective nail fold appears to be healing appropriately.  The open wound to the associated nail fold has healed.  No drainage.  Well-healing nail matricectomy site ? ?Assessment: ?#1 s/p partial permanent nail matrixectomy lateral border left great toe ? ? ?Plan of care: ?#1 patient was evaluated  ?#2 light debridement of open wound was performed to the periungual border of the respective toe using a currette. Antibiotic ointment and Band-Aid was applied. ?#3 patient is to return to clinic on a PRN basis. ? ? ?Felecia Shelling, DPM ?[facility] ? ?Dr. Felecia Shelling, DPM  ?  ?[address]                                      ?[place], [place] [address]                ?Office [phone]   ?Fax [phone] ? ? ? ? ?

## 2021-09-02 ENCOUNTER — Encounter: Payer: Self-pay | Admitting: Physician Assistant

## 2021-09-02 ENCOUNTER — Telehealth: Payer: Self-pay

## 2021-09-02 NOTE — Telephone Encounter (Signed)
CALLED PATIENT NO ANSWER LEFT VOICEMAIL FOR A CALL BACK ? ?

## 2021-09-03 ENCOUNTER — Telehealth: Payer: Self-pay | Admitting: Internal Medicine

## 2021-09-03 ENCOUNTER — Other Ambulatory Visit: Payer: Self-pay

## 2021-09-03 ENCOUNTER — Telehealth: Payer: Self-pay

## 2021-09-03 DIAGNOSIS — Z1211 Encounter for screening for malignant neoplasm of colon: Secondary | ICD-10-CM

## 2021-09-03 DIAGNOSIS — I700 Atherosclerosis of aorta: Secondary | ICD-10-CM | POA: Insufficient documentation

## 2021-09-03 NOTE — Telephone Encounter (Signed)
Patient with diagnosis of afib on Eliquis for anticoagulation.    Procedure: colonoscopy Date of procedure: 10/28/21  CHA2DS2-VASc Score = 5  This indicates a 7.2% annual risk of stroke. The patient's score is based upon: CHF History: 0 HTN History: 1 Diabetes History: 0 Stroke History: 2 Vascular Disease History: 1 Age Score: 0 Gender Score: 1  Aortic atherosclerosis noted on abdominal CT 07/2020, PMH updated.  CrCl >118mL/min Platelet count 369K  Per office protocol, patient can hold Eliquis for 1 day prior to procedure. She should resume as soon as safely possible after given prior stroke.

## 2021-09-03 NOTE — Telephone Encounter (Signed)
Please advise recommendation for holding Eliquis for colonoscopy scheduled for 10/28/2021.  Thank you, Ambrose Pancoast, NP

## 2021-09-03 NOTE — Telephone Encounter (Signed)
Gastroenterology Pre-Procedure Review  Request Date: 10/28/21 Requesting Physician: Dr. Tobi Bastos  PATIENT REVIEW QUESTIONS: The patient responded to the following health history questions as indicated:    1. Are you having any GI issues? no 2. Do you have a personal history of Polyps? no 3. Do you have a family history of Colon Cancer or Polyps? no 4. Diabetes Mellitus? no 5. Joint replacements in the past 12 months?no 6. Major health problems in the past 3 months? 6 years ago stroke 7. Any artificial heart valves, MVP, or defibrillator?no    MEDICATIONS & ALLERGIES:    Patient reports the following regarding taking any anticoagulation/antiplatelet therapy:   Plavix, Coumadin, Eliquis, Xarelto, Lovenox, Pradaxa, Brilinta, or Effient? yes (Eliquis Margaret [name] & Dr. Okey Dupre) Aspirin? no  Patient confirms/reports the following medications:  Current Outpatient Medications  Medication Sig Dispense Refill   apixaban (ELIQUIS) 5 MG TABS tablet Take 1 tablet (5 mg total) by mouth 2 (two) times daily. 180 tablet 1   atorvastatin (LIPITOR) 80 MG tablet Take 1 tablet (80 mg total) by mouth daily. 90 tablet 0   Cyanocobalamin (B-12 PO) Take 1 tablet by mouth daily.     fluticasone (FLONASE) 50 MCG/ACT nasal spray Place 2 sprays into both nostrils daily. (Patient not taking: Reported on 05/07/2021) 16 g 0   gentamicin cream (GARAMYCIN) 0.1 % Apply 1 application. topically 2 (two) times daily. 30 g 1   levothyroxine (SYNTHROID) 125 MCG tablet Take 1 tablet (125 mcg total) by mouth daily before breakfast. FURTHER REFILLS WILL NEED TO BE SENT BY PCP 30 tablet 1   metoprolol tartrate (LOPRESSOR) 25 MG tablet Take 1 tablet (25 mg total) by mouth 2 (two) times daily. 180 tablet 0   VICTOZA 18 MG/3ML SOPN Inject into the skin.     No current facility-administered medications for this visit.    Patient confirms/reports the following allergies:  No Known Allergies  No orders of the defined types were placed  in this encounter.   AUTHORIZATION INFORMATION Primary Insurance: 1D#: Group #:  Secondary Insurance: 1D#: Group #:  SCHEDULE INFORMATION: Date: 10/28/21 Time: Location: [facility]

## 2021-09-03 NOTE — Telephone Encounter (Signed)
   Pre-operative Risk Assessment    Patient Name: Margaret [name]  DOB: May 02, 1970 MRN: [id number]      Request for Surgical Clearance    Procedure:  Colonoscopy  Date of Surgery:  Clearance 10/28/21                                 Surgeon:  not listed Surgeon's Group or Practice Name:  [facility] Phone number:  [phone] Fax number:  [phone]   Type of Clearance Requested:  pharmacy Eliquis    Type of Anesthesia:  Not Indicated   Additional requests/questions:    Signed, Pilar A Ham   09/03/2021, 3:43 PM

## 2021-09-05 ENCOUNTER — Telehealth: Payer: Self-pay

## 2021-09-05 NOTE — Telephone Encounter (Signed)
Patient has been advised per Dr. Okey Dupre [facility] to hold Eliquis for 1 day prior to procedure and resume as soon as safely possible.  Patient verbalized understanding of instructions.  Thanks, [place], [place]

## 2021-09-05 NOTE — Telephone Encounter (Signed)
   Patient Name: Margaret [name]  DOB: 05-16-1970 MRN: [id number]  Primary Cardiologist: [name], MD  Chart reviewed as part of pre-operative protocol coverage.   Per office protocol, patient can hold Eliquis for 1 day prior to procedure. She should resume as soon as safely possible after given prior stroke.   Napoleon Form, Leodis Rains, NP 09/05/2021, 10:45 AM

## 2021-09-08 ENCOUNTER — Other Ambulatory Visit: Payer: Self-pay

## 2021-09-08 ENCOUNTER — Emergency Department: Payer: Medicaid Other

## 2021-09-08 ENCOUNTER — Emergency Department
Admission: EM | Admit: 2021-09-08 | Discharge: 2021-09-08 | Disposition: A | Discharge status: Home / Self Care | Payer: Medicaid Other | Attending: Student in an Organized Health Care Education/Training Program | Admitting: Student in an Organized Health Care Education/Training Program

## 2021-09-08 DIAGNOSIS — R1033 Periumbilical pain: Secondary | ICD-10-CM | POA: Diagnosis present

## 2021-09-08 DIAGNOSIS — K429 Umbilical hernia without obstruction or gangrene: Secondary | ICD-10-CM | POA: Diagnosis not present

## 2021-09-08 LAB — CBC
HCT: 36.6 % (ref 36.0–46.0)
Hemoglobin: 11.7 g/dL — ABNORMAL LOW (ref 12.0–15.0)
MCH: 26.7 pg (ref 26.0–34.0)
MCHC: 32 g/dL (ref 30.0–36.0)
MCV: 83.4 fL (ref 80.0–100.0)
Platelets: 353 10*3/uL (ref 150–400)
RBC: 4.39 MIL/uL (ref 3.87–5.11)
RDW: 15.2 % (ref 11.5–15.5)
WBC: 12.1 10*3/uL — ABNORMAL HIGH (ref 4.0–10.5)
nRBC: 0 % (ref 0.0–0.2)

## 2021-09-08 LAB — COMPREHENSIVE METABOLIC PANEL
ALT: 16 U/L (ref 0–44)
AST: 22 U/L (ref 15–41)
Albumin: 3.5 g/dL (ref 3.5–5.0)
Alkaline Phosphatase: 95 U/L (ref 38–126)
Anion gap: 7 (ref 5–15)
BUN: 5 mg/dL — ABNORMAL LOW (ref 6–20)
CO2: 25 mmol/L (ref 22–32)
Calcium: 9.1 mg/dL (ref 8.9–10.3)
Chloride: 107 mmol/L (ref 98–111)
Creatinine, Ser: 0.84 mg/dL (ref 0.44–1.00)
GFR, Estimated: >60 mL/min (ref >60)
Glucose, Bld: 145 mg/dL — ABNORMAL HIGH (ref 70–99)
Potassium: 3.2 mmol/L — ABNORMAL LOW (ref 3.5–5.1)
Sodium: 139 mmol/L (ref 135–145)
Total Bilirubin: 0.9 mg/dL (ref 0.3–1.2)
Total Protein: 7.2 g/dL (ref 6.5–8.1)

## 2021-09-08 LAB — URINALYSIS, ROUTINE W REFLEX MICROSCOPIC
Bilirubin Urine: NEGATIVE
Glucose, UA: NEGATIVE mg/dL
Hgb urine dipstick: NEGATIVE
Ketones, ur: NEGATIVE mg/dL
Leukocytes,Ua: NEGATIVE
Nitrite: NEGATIVE
Protein, ur: NEGATIVE mg/dL
Specific Gravity, Urine: 1.017 (ref 1.005–1.030)
pH: 6 (ref 5.0–8.0)

## 2021-09-08 LAB — LIPASE, BLOOD: Lipase: 47 U/L (ref 11–51)

## 2021-09-08 LAB — PREGNANCY, URINE: Preg Test, Ur: NEGATIVE

## 2021-09-08 MED ORDER — HYDROMORPHONE HCL 1 MG/ML IJ SOLN
0.5000 mg | INTRAMUSCULAR | Status: DC | PRN
  Administered 2021-09-08: 0.5 mg via INTRAVENOUS
  Filled 2021-09-08: qty 0.5

## 2021-09-08 MED ORDER — SODIUM CHLORIDE 0.9 % IV BOLUS
1000.0000 mL | Freq: Once | INTRAVENOUS | Status: AC
  Administered 2021-09-08: 1000 mL via INTRAVENOUS

## 2021-09-08 MED ORDER — IOHEXOL 300 MG/ML  SOLN
100.0000 mL | Freq: Once | INTRAMUSCULAR | Status: AC | PRN
Start: 2021-09-08 — End: 2021-09-08
  Administered 2021-09-08: 100 mL via INTRAVENOUS

## 2021-09-08 MED ORDER — HYDROMORPHONE HCL 1 MG/ML IJ SOLN
1.0000 mg | INTRAMUSCULAR | Status: DC | PRN
  Administered 2021-09-08: 1 mg via INTRAVENOUS
  Filled 2021-09-08: qty 1

## 2021-09-08 MED ORDER — ONDANSETRON HCL 4 MG/2ML IJ SOLN
4.0000 mg | Freq: Once | INTRAMUSCULAR | Status: AC
  Administered 2021-09-08: 4 mg via INTRAVENOUS
  Filled 2021-09-08: qty 2

## 2021-09-08 NOTE — ED Provider Notes (Signed)
[facility] Provider Note    Event Date/Time   First MD Initiated Contact with Patient 09/08/21 1708     (approximate)   History   Abdominal Pain   HPI  Margaret [name] is a 51 y.o. female with a history of periumbilical hernia presents to the ER for acute onset moderate to severe umbilical pain that started last night.  Describes as burning in nature having nausea and vomiting secondary to pain.  Feels like she is not been able to move her bowels.  Has never had pain quite like this before.     Physical Exam   Triage Vital Signs: ED Triage Vitals  Enc Vitals Group     BP 09/08/21 1553 (!) 146/105     Pulse Rate 09/08/21 1553 85     Resp 09/08/21 1553 18     Temp 09/08/21 1553 98.2 F (36.8 C)     Temp src --      SpO2 09/08/21 1553 100 %     Weight --      Height --      Head Circumference --      Peak Flow --      Pain Score 09/08/21 1552 8     Pain Loc --      Pain Edu? --      Excl. in GC? --     Most recent vital signs: Vitals:   09/08/21 1553 09/08/21 1855  BP: (!) 146/105   Pulse: 85 77  Resp: 18 16  Temp: 98.2 F (36.8 C)   SpO2: 100% 98%     Constitutional: Alert  Eyes: Conjunctivae are normal.  Head: Atraumatic. Nose: No congestion/rhinnorhea. Mouth/Throat: Mucous membranes are moist.   Neck: Painless ROM.  Cardiovascular:   Good peripheral circulation. Respiratory: Normal respiratory effort.  No retractions.  Gastrointestinal: Soft with tender supraumbilical mass c./w previously documented hernia Musculoskeletal:  no deformity Neurologic:  MAE spontaneously. No gross focal neurologic deficits are appreciated.  Skin:  Skin is warm, dry and intact. No rash noted. Psychiatric: Mood and affect are normal. Speech and behavior are normal.    ED Results / Procedures / Treatments   Labs (all labs ordered are listed, but only abnormal results are displayed) Labs Reviewed  COMPREHENSIVE METABOLIC PANEL - Abnormal;  Notable for the following components:      Result Value   Potassium 3.2 (*)    Glucose, Bld 145 (*)    BUN 5 (*)    All other components within normal limits  CBC - Abnormal; Notable for the following components:   WBC 12.1 (*)    Hemoglobin 11.7 (*)    All other components within normal limits  URINALYSIS, ROUTINE W REFLEX MICROSCOPIC - Abnormal; Notable for the following components:   Color, Urine YELLOW (*)    APPearance CLEAR (*)    All other components within normal limits  LIPASE, BLOOD  PREGNANCY, URINE     EKG     RADIOLOGY Please see ED Course for my review and interpretation.  I personally reviewed all radiographic images ordered to evaluate for the above acute complaints and reviewed radiology reports and findings.  These findings were personally discussed with the patient.  Please see medical record for radiology report.    PROCEDURES:  Critical Care performed: No  Hernia reduction  Date/Time: 09/08/2021 7:05 PM Performed by: Willy Eddy, MD Authorized by: Willy Eddy, MD  Consent: Verbal consent obtained. Consent given by: patient Patient  identity confirmed: verbally with patient Time out: Immediately prior to procedure a "time out" was called to verify the correct patient, procedure, equipment, support staff and site/side marked as required. Local anesthesia used: no  Anesthesia: Local anesthesia used: no  Sedation: Patient sedated: no  Patient tolerance: patient tolerated the procedure well with no immediate complications     MEDICATIONS ORDERED IN ED: Medications  HYDROmorphone (DILAUDID) injection 0.5 mg (0.5 mg Intravenous Given 09/08/21 1750)  HYDROmorphone (DILAUDID) injection 1 mg (1 mg Intravenous Given 09/08/21 1855)  ondansetron (ZOFRAN) injection 4 mg (4 mg Intravenous Given 09/08/21 1750)  sodium chloride 0.9 % bolus 1,000 mL (1,000 mLs Intravenous New Bag/Given 09/08/21 1751)  iohexol (OMNIPAQUE) 300 MG/ML solution 100 mL  (100 mLs Intravenous Contrast Given 09/08/21 1814)     IMPRESSION / MDM / ASSESSMENT AND PLAN / ED COURSE  I reviewed the triage vital signs and the nursing notes.                              Differential diagnosis includes, but is not limited to, sbo, hernia, mass, diverticulitis, colitis, gastritis  Patient presenting to the ER for evaluation of acute abdominal pain with hernia as described above concerning for bowel obstruction by history and possible strangulation on exam.  Patient given IV narcotic pain medication.  This presenting complaint could reflect a potentially life-threatening illness therefore the patient will be placed on continuous pulse oximetry and telemetry for monitoring.  Laboratory evaluation will be sent to evaluate for the above complaints.     Clinical Course as of 09/08/21 2033  Mon Sep 08, 2021  1755 Patient appears comfortable after receiving IV Dilaudid.  I went to attempt reduction of hernia patient pushing me away refusing to allow attempted reduction.  Will order CT imaging [PR]  1823 CT imaging by my interpretation does show evidence of periumbilical hernia with no evidence of bowel obstruction. [PR]  1902 After multiple attempts finally able to reduce umbilical hernia.  Patient feels significant improved at this time.  Will observe she received IV narcotic medication and is a bit drowsy still. [PR]  1952 Patient reassessed.  Feels significantly improved. [PR]  2031 Patient up ambulating steady gait.  Does appear stable and appropriate for outpatient follow-up at this point. [PR]    Clinical Course User Index [PR] Willy Eddy, MD     FINAL CLINICAL IMPRESSION(S) / ED DIAGNOSES   Final diagnoses:  Umbilical hernia without obstruction and without gangrene     Rx / DC Orders   ED Discharge Orders     None        Note:  This document was prepared using Dragon voice recognition software and may include unintentional dictation errors.     Willy Eddy, MD 09/08/21 2033

## 2021-09-08 NOTE — ED Triage Notes (Signed)
Pt comes with c/o belly pain, nausea and vomiting since last night. Pt also states constipation.

## 2021-09-11 ENCOUNTER — Ambulatory Visit (INDEPENDENT_AMBULATORY_CARE_PROVIDER_SITE_OTHER): Payer: Medicaid Other | Admitting: Surgery

## 2021-09-11 ENCOUNTER — Encounter: Payer: Self-pay | Admitting: Surgery

## 2021-09-11 VITALS — BP 132/89 | HR 76 | Temp 97.9°F | Wt 235.4 lb

## 2021-09-11 DIAGNOSIS — K430 Incisional hernia with obstruction, without gangrene: Secondary | ICD-10-CM | POA: Diagnosis not present

## 2021-09-11 NOTE — Patient Instructions (Addendum)
Please try to stop smoking.   You may try wearing an abdominal binder to see if this helps with the discomfort/pain.   Umbilical Hernia, Adult  A hernia is a bulge of tissue that pushes through an opening between muscles. An umbilical hernia happens in the abdomen, near the belly button (umbilicus). The hernia may contain tissues from the small intestine, large intestine, or fatty tissue covering the intestines. Umbilical hernias in adults tend to get worse over time, and they require surgical treatment. There are different types of umbilical hernias, including: Indirect hernia. This type is located just above or below the umbilicus. It is the most common type of umbilical hernia in adults. Direct hernia. This type forms through an opening formed by the umbilicus. Reducible hernia. This type of hernia comes and goes. It may be visible only when you strain, lift something heavy, or cough. This type of hernia can be pushed back into the abdomen (reduced). Incarcerated hernia. This type traps abdominal tissue inside the hernia. This type of hernia cannot be reduced. Strangulated hernia. This type of hernia cuts off blood flow to the tissues inside the hernia. The tissues can start to die if this happens. This type of hernia requires emergency treatment. What are the causes? An umbilical hernia happens when tissue inside the abdomen presses on a weak area of the abdominal muscles. What increases the risk? You may have a greater risk of this condition if you: Are obese. Have had several pregnancies. Have a buildup of fluid inside your abdomen. Have had surgery that weakens the abdominal muscles. What are the signs or symptoms? The main symptom of this condition is a painless bulge at or near the belly button. A reducible hernia may be visible only when you strain, lift something heavy, or cough. Other symptoms may include: Dull pain. A feeling of pressure. Symptoms of a strangulated hernia may  include: Pain that gets increasingly worse. Nausea and vomiting. Pain when pressing on the hernia. Skin over the hernia becoming red or purple. Constipation. Blood in the stool. How is this diagnosed? This condition may be diagnosed based on: A physical exam. You may be asked to cough or strain while standing. These actions increase the pressure inside your abdomen and can force the hernia through the opening in your muscles. Your health care provider may try to reduce the hernia by pressing on it. Your symptoms and medical history. How is this treated? Surgery is the only treatment for an umbilical hernia. Surgery for a strangulated hernia is done as soon as possible. If you have a small hernia that is not incarcerated, you may need to lose weight before having surgery. Follow these instructions at home: Lose weight, if told by your health care provider. Do not try to push the hernia back in. Watch your hernia for any changes in color or size. Tell your health care provider if any changes occur. You may need to avoid activities that increase pressure on your hernia. Do not lift anything that is heavier than 10 lb (4.5 kg), or the limit that you are told, until your health care provider says that it is safe. Take over-the-counter and prescription medicines only as told by your health care provider. Keep all follow-up visits. This is important. Contact a health care provider if: Your hernia gets larger. Your hernia becomes painful. Get help right away if: You develop sudden, severe pain near the area of your hernia. You have pain as well as nausea or vomiting.  You have pain and the skin over your hernia changes color. You develop a fever or chills. Summary A hernia is a bulge of tissue that pushes through an opening between muscles. An umbilical hernia happens near the belly button. Surgery is the only treatment for an umbilical hernia. Do not try to push your hernia back in. Keep all  follow-up visits. This is important. This information is not intended to replace advice given to you by your health care provider. Make sure you discuss any questions you have with your health care provider. Document Revised: 10/30/2019 Document Reviewed: 10/30/2019 Elsevier Patient Education  2023 [employer].

## 2021-09-15 NOTE — Progress Notes (Signed)
Patient ID: Margaret [name], female   DOB: 12/29/1970, 51 y.o.   MRN: [id number]  Chief Complaint: Recurrent incisional hernia  History of Present Illness Margaret [name] is a 51 y.o. female with limited pain around a laparoscopic cholecystectomy incision where a prior hernia defect was primarily repaired with suture electing not to utilize mesh at that time.  Due to various social reasons, she had had an operation scheduled to proceed with a mesh repair, but remains to be somewhat reluctant to proceed with the same.  Prior schedule procedures had been canceled for undocumented reasons.  As this is a recurrent hernia, I am encouraging her to consider proceeding with a mesh repair.  She does desire to pursue through a nonmesh alternative and I instructed her that it would be best in that situation to be at a place where she has lost this significant amount of weight and therefore the natural tensions of her body weight will not add additional risk for recurrence.  She seems to be motivated by this and desires to defer elective repair at this time.  Past Medical History Past Medical History:  Diagnosis Date   Anxiety    Arthritis    Asthma    controlled   CVA (cerebral vascular accident) (HCC)    a. 09/2016   Depression    Dysrhythmia    Headache    Hypertension    Hypothyroidism    Morbid obesity (HCC)    PAF (paroxysmal atrial fibrillation) (HCC) 09/2016   a. 30-day event monitor 6/18: predominant rhythm of sinus with isolated PACs and PVCs.  Two narrow complex tachycardia episodes concerning for A. Fib +/-atrial flutter or SVT.  No prolonged pauses; b. CHADS2VASc = 3 (stroke x 2, female)--> Eliquis.   PFO (patent foramen ovale)    a. TTE 6/18: EF of 60 to 65%, normal wall motion, grade 1 diastolic dysfunction; b. TEE 6/18: evidence of a very small atrial level right to left shunt by bubble study without visualization of ASD or PFO   Substance abuse (HCC)    Past use of cocaine    Tobacco abuse        Past Surgical History:  Procedure Laterality Date   FRACTURE SURGERY     TEE WITHOUT CARDIOVERSION N/A 09/08/2016   Procedure: TRANSESOPHAGEAL ECHOCARDIOGRAM (TEE);  Surgeon: [name], MD;  Location: [facility] ORS;  Service: Cardiovascular;  Laterality: N/A;   XI ROBOTIC ASSISTED VENTRAL HERNIA N/A 09/13/2019   Procedure: XI ROBOTIC Cholecystectomy, with primary non-mesh repair of EPIGATRIC HERNIA;  Surgeon: Campbell Lerner, MD;  Location: [facility] ORS;  Service: General;  Laterality: N/A;    No Known Allergies  Current Outpatient Medications  Medication Sig Dispense Refill   atorvastatin (LIPITOR) 80 MG tablet Take 1 tablet (80 mg total) by mouth daily. 90 tablet 0   Cyanocobalamin (B-12 PO) Take 1 tablet by mouth daily.     fluticasone (FLONASE) 50 MCG/ACT nasal spray Place 2 sprays into both nostrils daily. 16 g 0   gentamicin cream (GARAMYCIN) 0.1 % Apply 1 application. topically 2 (two) times daily. 30 g 1   metoprolol tartrate (LOPRESSOR) 25 MG tablet Take 1 tablet (25 mg total) by mouth 2 (two) times daily. 180 tablet 0   VICTOZA 18 MG/3ML SOPN Inject into the skin.     apixaban (ELIQUIS) 5 MG TABS tablet Take 1 tablet (5 mg total) by mouth 2 (two) times daily. 180 tablet 1   levothyroxine (SYNTHROID) 125 MCG tablet Take 1  tablet (125 mcg total) by mouth daily before breakfast. FURTHER REFILLS WILL NEED TO BE SENT BY PCP 30 tablet 1   No current facility-administered medications for this visit.    Family History Family History  Problem Relation Age of Onset   Stroke Father    Hypertension Daughter    Migraines Daughter    Cervical cancer Maternal Grandmother    Heart disease Maternal Grandfather    Stroke Maternal Grandfather       Social History Social History   Tobacco Use   Smoking status: Every Day    Packs/day: 10.00    Types: Cigarettes   Smokeless tobacco: Former    Types: Snuff   Tobacco comments:    trying to quit; going to start using patches;    Vaping Use   Vaping Use: Never used  Substance Use Topics   Alcohol use: Not Currently    Alcohol/week: 0.0 standard drinks of alcohol    Comment: on occasion   Drug use: Not Currently    Types: Marijuana    Comment: Previously, but stopped years ago        Review of Systems  All other systems reviewed and are negative.    Physical Exam Blood pressure 132/89, pulse 76, temperature 97.9 F (36.6 C), temperature source Oral, weight 235 lb 6.4 oz (106.8 kg), last menstrual period 05/07/2016, SpO2 100 %. Last Weight  Most recent update: 09/11/2021  8:47 AM    Weight  106.8 kg (235 lb 6.4 oz)             CONSTITUTIONAL: Well developed, morbidly obese and well nourished, appropriately responsive and aware without distress.   EYES: Sclera non-icteric.   EARS, NOSE, MOUTH AND THROAT: Mask worn.   Hearing is intact to voice.  NECK: Trachea is midline, and there is no jugular venous distension.  LYMPH NODES:  Lymph nodes in the neck are not enlarged. RESPIRATORY:  Lungs are clear, and breath sounds are equal bilaterally. Normal respiratory effort without pathologic use of accessory muscles. CARDIOVASCULAR: Heart is regular in rate and rhythm. GI: The abdomen is obese with supraumbilical mass that is mildly tender with radiation to the left and right from this area.  Otherwise soft, nontender, and nondistended. There were no other palpable masses. I did not appreciate hepatosplenomegaly. There were normal bowel sounds. MUSCULOSKELETAL:  Symmetrical muscle tone appreciated in all four extremities.    SKIN: Skin turgor is normal. No pathologic skin lesions appreciated.  NEUROLOGIC:  Motor and sensation appear grossly normal.  Cranial nerves are grossly without defect. PSYCH:  Alert and oriented to person, place and time. Affect is appropriate for situation.  Data Reviewed I have personally reviewed what is currently available of the patient's imaging, recent labs and medical records.    Labs:     Latest Ref Rng & Units 09/08/2021    3:53 PM 05/07/2021    3:22 PM 08/24/2020    4:45 PM  CBC  WBC 4.0 - 10.5 K/uL 12.1  9.3  11.9   Hemoglobin 12.0 - 15.0 g/dL 29.511.7  28.412.3  13.211.7   Hematocrit 36.0 - 46.0 % 36.6  36.9  35.9   Platelets 150 - 400 K/uL 353  369  350       Latest Ref Rng & Units 09/08/2021    3:53 PM 05/07/2021    3:22 PM 08/24/2020    4:45 PM  CMP  Glucose 70 - 99 mg/dL 440145  91  [id number]  BUN 6 - 20 mg/dL 5  5  8    Creatinine 0.44 - 1.00 mg/dL  6.38  4.53   Sodium 135 - 145 mmol/L 139  142  139   Potassium 3.5 - 5.1 mmol/L 3.2  3.9  3.1   Chloride 98 - 111 mmol/L 107  101  106   CO2 22 - 32 mmol/L 25  26  24    Calcium 8.9 - 10.3 mg/dL 9.1  9.6  9.0   Total Protein 6.5 - 8.1 g/dL 7.2  6.9    Total Bilirubin 0.3 - 1.2 mg/dL 0.9  0.5    Alkaline Phos 38 - 126 U/L 95  111    AST 15 - 41 U/L 22  23    ALT 0 - 44 U/L 16  16        Imaging: Radiology review: Images personally reviewed.  I concur there is no evidence of bowel involvement in the incarcerated hernia which is primarily fat/likely omental.   CLINICAL DATA:  Abdominal pain common nausea and vomiting since last night   EXAM: CT ABDOMEN AND PELVIS WITH CONTRAST   TECHNIQUE: Multidetector CT imaging of the abdomen and pelvis was performed using the standard protocol following bolus administration of intravenous contrast.   RADIATION DOSE REDUCTION: This exam was performed according to the departmental dose-optimization program which includes automated exposure control, adjustment of the mA and/or kV according to patient size and/or use of iterative reconstruction technique.   CONTRAST:  6.46 OMNIPAQUE IOHEXOL 300 MG/ML  SOLN   COMPARISON:  CT July 17, 2020   FINDINGS: Lower chest: Hypoventilatory change in the lung bases.   Hepatobiliary: No suspicious hepatic lesion. Gallbladder surgically absent. Mild prominence of the biliary tree is similar prior and favored reservoir effect post  cholecystectomy.   Pancreas: Pancreatic ductal dilation or evidence of acute inflammation. Probable pancreatic divisum, anatomic variant.   Spleen: No splenomegaly or focal splenic lesion.   Adrenals/Urinary Tract: Similar lobular thickening of the left adrenal gland without nodularity by size criteria, commonly reflecting tiny benign adrenal adenomas and requiring no individual follow-up. Right adrenal gland appears normal. No hydronephrosis. Kidneys demonstrate symmetric enhancement.   Stomach/Bowel: No radiopaque enteric contrast material. Stomach is unremarkable for degree of distension. No pathologic dilation of small or large bowel. The appendix and terminal ileum appear normal. Evidence of acute bowel inflammation.   Vascular/Lymphatic: Normal caliber abdominal aorta. No pathologically enlarged abdominal or pelvic lymph nodes.   Reproductive: Uterus and bilateral adnexa are unremarkable.   Other: Trace pelvic free fluid is within physiologic normal limits. Probable inflammatory fluid within a fat containing supraumbilical ventral hernia with a 2.3 cm aperture width   Musculoskeletal: No acute osseous abnormality.   IMPRESSION: 1. Probable inflammatory fluid within a moderate-sized fat containing supraumbilical ventral hernia with a 2.3 cm aperture width, recommend clinical correlation for reducibility. 2. No evidence of bowel obstruction or acute bowel inflammation.     Electronically Signed   By: M.D.   On: 09/08/2021 18:35   Assessment    Incarcerated, recurrent ventral hernia.  No evidence of bowel impairment on CT scan examination or clinical. Patient Active Problem List   Diagnosis Date Noted   Aortic atherosclerosis (HCC) 09/03/2021   Adjustment disorder with mixed anxiety and depressed mood 08/05/2021   Insomnia, unspecified 08/05/2021   Right sided weakness 10/24/2020   Essential hypertension 10/24/2020   Constipation 06/27/2020    Fatigue 04/05/2020   Encounter to establish care  with new doctor 01/22/2020   Encounter for hepatitis C screening test for low risk patient 01/22/2020   Screening examination for sexually transmitted disease 01/22/2020   Skin tag 01/22/2020   Nonintractable headache 01/22/2020   Status post laparoscopic cholecystectomy 09/28/2019   Dyspnea on exertion 08/15/2018   Multinodular goiter 04/28/2017   Paroxysmal atrial fibrillation (HCC) 10/29/2016   PFO (patent foramen ovale) 09/23/2016   Hyperlipidemia 09/17/2016   IFG (impaired fasting glucose) 09/17/2016   History of stroke 09/06/2016   Myopia 12/20/2014   Abdominal pain 12/20/2014   Depression 12/20/2014   Right arm pain 11/21/2014   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  We discussed robotic recurrent ventral hernia repair with mesh.  We discussed mesh in detail and the need to utilize it in lieu of the fact that this fascial defect had been previously primarily repaired.  We discussed weight loss and the opportunity to defer elective repair. I discussed possibility of incarceration, strangulation, enlargement in size over time, and the need for emergency surgery in the face of these.  Also reviewed the techniques of reduction should incarceration occur, and when unsuccessful to present to the ED.  Also discussed that surgery risks include recurrence which can be up to 30% in the case of complex hernias, use of prosthetic materials (mesh) and the increased risk of infection and the possible need for re-operation and removal of mesh, possibility of post-op SBO or ileus, and the risks of general anesthetic including heart attack, stroke, sudden death or some reaction to anesthetic medications. The patient, and those  present, appear to understand the risks, any and all questions were answered to the patient's satisfaction.  No guarantees were ever expressed or implied. Face-to-face time spent with the patient and accompanying care providers(if present) was 40 minutes, with more than 50% of the time spent counseling, educating, and coordinating care of the patient.    She continues to smoke and I have asked her also that smoking cessation will enable her to have the best both postoperative outcome as feasible.      Campbell Lerner M.D., FACS 09/15/2021, 3:33 PM

## 2021-10-14 ENCOUNTER — Ambulatory Visit: Payer: Medicaid Other | Admitting: Surgery

## 2021-10-28 ENCOUNTER — Ambulatory Visit: Admission: RE | Admit: 2021-10-28 | Payer: Medicaid Other | Source: Home / Self Care | Admitting: Gastroenterology

## 2021-10-28 ENCOUNTER — Encounter: Admission: RE | Payer: Self-pay | Source: Home / Self Care

## 2021-10-28 SURGERY — COLONOSCOPY WITH PROPOFOL
Anesthesia: General

## 2021-11-05 ENCOUNTER — Ambulatory Visit: Payer: Medicaid Other | Admitting: Podiatry

## 2021-11-10 ENCOUNTER — Other Ambulatory Visit: Payer: Self-pay | Admitting: Internal Medicine

## 2021-11-10 DIAGNOSIS — I480 Paroxysmal atrial fibrillation: Secondary | ICD-10-CM

## 2021-11-10 MED ORDER — APIXABAN 5 MG PO TABS: 5.0000 mg | ORAL_TABLET | Freq: Two times a day (BID) | ORAL | 1 refills | Status: DC

## 2021-11-10 NOTE — Telephone Encounter (Signed)
Prescription refill request for Eliquis received. Indication: PAF Last office visit: 05/07/21  R Dunn PA-C Scr: 0.84 on 09/08/21 Age:  51 Weight: 107.5kg  Based on above findings Eliquis 5mg  twice daily is the appropriate dose.  Refill approved.

## 2021-11-20 ENCOUNTER — Emergency Department: Payer: Medicaid Other

## 2021-11-20 ENCOUNTER — Encounter: Payer: Self-pay | Admitting: Emergency Medicine

## 2021-11-20 ENCOUNTER — Other Ambulatory Visit: Payer: Self-pay

## 2021-11-20 ENCOUNTER — Emergency Department
Admission: EM | Admit: 2021-11-20 | Discharge: 2021-11-20 | Disposition: A | Discharge status: Home / Self Care | Payer: Medicaid Other | Attending: Emergency Medicine | Admitting: Emergency Medicine

## 2021-11-20 DIAGNOSIS — R0789 Other chest pain: Secondary | ICD-10-CM | POA: Insufficient documentation

## 2021-11-20 DIAGNOSIS — R600 Localized edema: Secondary | ICD-10-CM | POA: Insufficient documentation

## 2021-11-20 DIAGNOSIS — I10 Essential (primary) hypertension: Secondary | ICD-10-CM | POA: Insufficient documentation

## 2021-11-20 DIAGNOSIS — R202 Paresthesia of skin: Secondary | ICD-10-CM | POA: Diagnosis not present

## 2021-11-20 LAB — BASIC METABOLIC PANEL
Anion gap: 7 (ref 5–15)
BUN: 9 mg/dL (ref 6–20)
CO2: 28 mmol/L (ref 22–32)
Calcium: 9.3 mg/dL (ref 8.9–10.3)
Chloride: 107 mmol/L (ref 98–111)
Creatinine, Ser: 1.06 mg/dL — ABNORMAL HIGH (ref 0.44–1.00)
GFR, Estimated: >60 mL/min (ref >60)
Glucose, Bld: 93 mg/dL (ref 70–99)
Potassium: 3.3 mmol/L — ABNORMAL LOW (ref 3.5–5.1)
Sodium: 142 mmol/L (ref 135–145)

## 2021-11-20 LAB — CBC
HCT: 38.6 % (ref 36.0–46.0)
Hemoglobin: 12.2 g/dL (ref 12.0–15.0)
MCH: 26.3 pg (ref 26.0–34.0)
MCHC: 31.6 g/dL (ref 30.0–36.0)
MCV: 83.4 fL (ref 80.0–100.0)
Platelets: 386 10*3/uL (ref 150–400)
RBC: 4.63 MIL/uL (ref 3.87–5.11)
RDW: 14.6 % (ref 11.5–15.5)
WBC: 9.9 10*3/uL (ref 4.0–10.5)
nRBC: 0 % (ref 0.0–0.2)

## 2021-11-20 LAB — TROPONIN I (HIGH SENSITIVITY): Troponin I (High Sensitivity): 6 ng/L (ref <18)

## 2021-11-20 MED ORDER — AZITHROMYCIN 250 MG PO TABS: ORAL_TABLET | ORAL | 0 refills | Status: AC

## 2021-11-20 NOTE — ED Triage Notes (Signed)
Patient to ED for centralized chest pain x1 week. Pain radiates into right arm with right hand numbness. Patient states she also has right ankle swelling that started Tuesday morning- no known injury.

## 2021-11-20 NOTE — ED Provider Notes (Signed)
[facility] Provider Note    Event Date/Time   First MD Initiated Contact with Patient 11/20/21 1827     (approximate)  History   Chief Complaint: Chest Pain  HPI  Margaret [name] is a 51 y.o. female with a past medical history of anxiety, depression, hypertension, obesity, proximal atrial fibrillation, presents to the emergency department for chest pain.  According to the patient for the last week or so she has been experiencing some central chest pain.  Today she was having some tingling in pain radiating into both of her hands per patient somewhat worse on the right side.  She has also noticed over the past week or 2 more swelling in her right lower extremity, patient was concerned so she came to the emergency department for evaluation.  Physical Exam   Triage Vital Signs: ED Triage Vitals  Enc Vitals Group     BP 11/20/21 1706 (!) 136/98     Pulse Rate 11/20/21 1706 73     Resp 11/20/21 1706 18     Temp 11/20/21 1706 98.7 F (37.1 C)     Temp Source 11/20/21 1706 Oral     SpO2 11/20/21 1706 97 %     Weight 11/20/21 1707 235 lb (106.6 kg)     Height 11/20/21 1707 5\' 5"  (1.651 m)     Head Circumference --      Peak Flow --      Pain Score 11/20/21 1706 8     Pain Loc --      Pain Edu? --      Excl. in GC? --     Most recent vital signs: Vitals:   11/20/21 1706 11/20/21 1815  BP: (!) 136/98 130/88  Pulse: 73 70  Resp: 18 16  Temp: 98.7 F (37.1 C)   SpO2: 97% 98%    General: Awake, no distress.  CV:  Good peripheral perfusion.  Regular rate and rhythm  Resp:  Normal effort.  Equal breath sounds bilaterally.  Abd:  No distention.  Soft, nontender.  No rebound or guarding. Other:  Mild lower extremity edema bilaterally possibly somewhat worse in the right lower extremity.   ED Results / Procedures / Treatments   EKG  EKG viewed and interpreted by myself shows a normal sinus rhythm at 73 bpm with a narrow QRS, normal axis, normal  intervals, no concerning ST changes.  RADIOLOGY  Ultrasound negative for DVT I have reviewed and interpreted the chest x-ray images.  No obvious abnormality on my evaluation. Radiology is read the x-ray is negative for acute process.  MEDICATIONS ORDERED IN ED: Medications - No data to display   IMPRESSION / MDM / ASSESSMENT AND PLAN / ED COURSE  I reviewed the triage vital signs and the nursing notes.  Patient's presentation is most consistent with acute presentation with potential threat to life or bodily function.  Patient presents emergency department for 1 week of intermittent chest pain.  Patient's work-up is reassuring including normal CBC normal chemistry negative troponin.  Patient's chest x-ray does show possible tracheal debris's.  Given the patient's chest discomfort we will prescribe a short course of antibiotics and have the patient follow-up with her doctor in 3 to 4 weeks for repeat chest x-ray.  Patient agreeable to plan of care.  Patient's ultrasound is negative for DVT we will prescribe compression stockings for the patient.  Given the patient's history I did consider admission to the hospital however given the patient's  reassuring work-up reassuring physical exam and reassuring vitals I believe the patient will be safe for discharge home with outpatient follow-up.  Patient agreeable to plan of care.  FINAL CLINICAL IMPRESSION(S) / ED DIAGNOSES   Peripheral edema Chest pain  Rx / DC Orders   Zithromax PCP follow-up for repeat chest x-ray in 4 weeks  Note:  This document was prepared using Dragon voice recognition software and may include unintentional dictation errors.   Minna Antis, MD 11/20/21 Ernestina [place]

## 2021-11-20 NOTE — ED Provider Triage Note (Signed)
Emergency Medicine Provider Triage Evaluation Note  ALICIA SEIB , a 51 y.o. female  was evaluated in triage.  Pt complains of chest pain, right leg edema, pain.  Both symptoms have started roughly a week ago.  Does not seem to be improving.  No associated shortness of breath.  No fevers or chills.  No history of DVT.  No cardiac history..  Review of Systems  Positive: Chest pain, right lower extremity edema, pain Negative: URI symptoms, cough, fevers or chills, GI complaints  Physical Exam  BP (!) 136/98 (BP Location: Left Arm)   Pulse 73   Temp 98.7 F (37.1 C) (Oral)   Resp 18   Ht 5\' 5"  (1.651 m)   Wt 106.6 kg   LMP 05/07/2016   SpO2 97%   BMI 39.11 kg/m  Gen:   Awake, no distress   Resp:  Normal effort  MSK:   Moves extremities without difficulty.  Edema noted on the right lower extremity when compared with left.  Tenderness to the calf Other:    Medical Decision Making  Medically screening exam initiated at 5:13 PM.  Appropriate orders placed.  KARLEIGH BUNTE was informed that the remainder of the evaluation will be completed by another provider, this initial triage assessment does not replace that evaluation, and the importance of remaining in the ED until their evaluation is complete.  Patient presents with right lower extremity edema, chest pain.  Patient will do labs, chest x-ray, ultrasound of the right lower extremity.   [name], PA-C 11/20/21 1713

## 2021-11-20 NOTE — Discharge Instructions (Signed)
You have been prescribed antibiotic.  Please take this antibiotic for its entire course.  Please follow-up with your doctor for repeat chest x-ray in 4 weeks to ensure resolution of any debris's or inflammation.  Return to the emergency department for any worsening chest pain trouble breathing or any other symptom personally concerning to yourself.

## 2021-12-01 ENCOUNTER — Ambulatory Visit: Payer: Medicaid Other | Admitting: Podiatry

## 2021-12-01 ENCOUNTER — Telehealth: Payer: Self-pay | Admitting: Podiatry

## 2021-12-01 NOTE — Telephone Encounter (Signed)
Pt called at 120pm to check on her appt time, I told pt her appt was at 115pm.Pt is to call to reschedule.

## 2021-12-16 NOTE — Progress Notes (Deleted)
Cardiology Office Note    Date:  12/16/2021   ID:  Margaret [name], DOB 12-11-1970, MRN [id number]  PCP:  [facility]  Cardiologist:  [name], MD  Electrophysiologist:  None   Chief Complaint: ED follow-up  History of Present Illness:   Margaret [name] is a 51 y.o. female with history of  PAF complicated by CVA, patent foramen ovale, COVID in 05/2019, 05/2020 and 03/2021, HTN, HLD, hypothyroidism, asthma, intractable headache, tobacco use, and remote substance abuse with marijuana and cocaine who presents for ED follow-up.   She was admitted to the hospital in 09/2016 with a right frontal lobe CVA.  Echo demonstrated an EF of 60 to 65%, normal wall motion, grade 1 diastolic dysfunction, and trivial mitral regurgitation.  Carotid artery ultrasound demonstrated less than 50% stenosis within the bilateral ICAs.  TEE demonstrated a very small atrial level right to left shunt by bubble study without visualization of ASD or PFO.  Subsequent lower extremity ultrasound was negative for DVT bilaterally.  Outpatient cardiac monitoring demonstrated predominantly sinus rhythm with 2 episodes of narrow complex tachycardia with some episodes without clear P waves consistent with possible A. fib versus flutter versus SVT.  She was initiated on Xarelto with subsequent transition to apixaban.  Transcranial Doppler was suggestive of a small Spencer grade 1 right to left shunt.   At her visit in 10/2020 she noted headaches along with right upper arm and shoulder discomfort.  She has been unable to take some of her medications, including apixaban.  She refused ED evaluation.  She left the clinic prior to arranging CT head.  Subsequent CT head showed no acute intracranial process.  At her visit in 01/2021 she reported some lower ankle swelling, weight loss of 15 pounds, and wanted to repeat another CT head.  Further cranial imaging was felt to be not indicated.  Lower extremity ultrasound was  discussed to evaluate for DVT, though the patient reported compliance with apixaban.  Patient ultimately refused lower extremity imaging and walked out of the office visit.  She was last seen in the office in 05/2021 and was without symptoms of angina, decompensation, or new stroke-like symptoms.  She reported adherence to cardiac medications.  She was in the process of obtaining a new PCP in the [place] area.  She was consuming a fair amount of salt and continued to smoke.  She was maintaining sinus rhythm.  She was advised to follow-up with a new PCP for ongoing management of her hypothyroidism/thyroid nodules.  She was seen in the ED on 11/20/2021 with chest pain felt to be atypical, upper extremity paresthesias, and swelling of the right lower extremity.  High-sensitivity troponin normal.  Lower extremity ultrasound negative for DVT.  Chest x-ray was without active cardiopulmonary disease with a nodular density projecting over the expected area of the distal trachea on the lateral view possibly due to tracheal debris with recommendation for follow-up 4 weeks to ensure resolution.  EKG shows sinus rhythm with no acute ST-T changes.  She was prescribed a short course of antibiotics and advised to follow-up with her PCP.  *** Needs to follow-up with PCP for chest x-ray   Labs independently reviewed: 11/2021 - Hgb 12.2, PLT 386, potassium 3.3, BUN 9, serum creatinine 1.06 09/2021 - albumin 3.5, AST/ALT normal 08/2021 - TC 139, TG 191, HDL 38, LDL 69 05/2021 - Free T4 normal, TSH normal  Past Medical History:  Diagnosis Date   Anxiety  Arthritis    Asthma    controlled   CVA (cerebral vascular accident) (HCC)    a. 09/2016   Depression    Dysrhythmia    Headache    Hypertension    Hypothyroidism    Morbid obesity (HCC)    PAF (paroxysmal atrial fibrillation) (HCC) 09/2016   a. 30-day event monitor 6/18: predominant rhythm of sinus with isolated PACs and PVCs.  Two narrow complex  tachycardia episodes concerning for A. Fib +/-atrial flutter or SVT.  No prolonged pauses; b. CHADS2VASc = 3 (stroke x 2, female)--> Eliquis.   PFO (patent foramen ovale)    a. TTE 6/18: EF of 60 to 65%, normal wall motion, grade 1 diastolic dysfunction; b. TEE 6/18: evidence of a very small atrial level right to left shunt by bubble study without visualization of ASD or PFO   Substance abuse (HCC)    Past use of cocaine    Tobacco abuse     Past Surgical History:  Procedure Laterality Date   FRACTURE SURGERY     TEE WITHOUT CARDIOVERSION N/A 09/08/2016   Procedure: TRANSESOPHAGEAL ECHOCARDIOGRAM (TEE);  Surgeon: [name], MD;  Location: [facility] ORS;  Service: Cardiovascular;  Laterality: N/A;   XI ROBOTIC ASSISTED VENTRAL HERNIA N/A 09/13/2019   Procedure: XI ROBOTIC Cholecystectomy, with primary non-mesh repair of EPIGATRIC HERNIA;  Surgeon: Campbell Lerner, MD;  Location: [facility] ORS;  Service: General;  Laterality: N/A;    Current Medications: No outpatient medications have been marked as taking for the 12/18/21 encounter (Appointment) with Sondra Barges, PA-C.    Allergies:   Patient has no known allergies.   Social History   Socioeconomic History   Marital status: Divorced    Spouse name: Not on file   Number of children: 2   Years of education: 12   Highest education level: Not on file  Occupational History   Occupation: [employer]  Tobacco Use   Smoking status: Every Day    Packs/day: 10.00    Types: Cigarettes   Smokeless tobacco: Former    Types: Snuff   Tobacco comments:    trying to quit; going to start using patches;   Vaping Use   Vaping Use: Never used  Substance and Sexual Activity   Alcohol use: Not Currently    Alcohol/week: 0.0 standard drinks of alcohol    Comment: on occasion   Drug use: Not Currently    Types: Marijuana    Comment: Previously, but stopped years ago   Sexual activity: Yes    Partners: Male    Birth control/protection: Condom   Other Topics Concern   Not on file  Social History Narrative   Lives with daughter   Caffeine use: daily   Right handed   Social Determinants of Health   Financial Resource Strain: Not on file  Food Insecurity: Not on file  Transportation Needs: Not on file  Physical Activity: Not on file  Stress: Not on file  Social Connections: Not on file     Family History:  The patient's family history includes Cervical cancer in her maternal grandmother; Heart disease in her maternal grandfather; Hypertension in her daughter; Migraines in her daughter; Stroke in her father and maternal grandfather.  ROS:   ROS   EKGs/Labs/Other Studies Reviewed:    Studies reviewed were summarized above. The additional studies were reviewed today: ***  EKG:  EKG is ordered today.  The EKG ordered today demonstrates ***  Recent Labs: 05/07/2021: TSH 3.860  09/08/2021: ALT 16 11/20/2021: BUN 9; Creatinine, Ser 1.06; Hemoglobin 12.2; Platelets 386; Potassium 3.3; Sodium 142  Recent Lipid Panel    Component Value Date/Time   CHOL 142 05/07/2021 1522   CHOL 140 09/04/2013 0405   TRIG 157 (H) 05/07/2021 1522   TRIG 137 09/04/2013 0405   HDL 35 (L) 05/07/2021 1522   HDL 30 (L) 09/04/2013 0405   CHOLHDL 4.1 05/07/2021 1522   CHOLHDL 3.3 01/22/2020 1107   VLDL 11 09/07/2016 0442   VLDL 27 09/04/2013 0405   LDLCALC 80 05/07/2021 1522   LDLCALC 67 01/22/2020 1107   LDLCALC 83 09/04/2013 0405   LDLDIRECT 80 05/07/2021 1522    PHYSICAL EXAM:    VS:  LMP 05/07/2016   BMI: There is no height or weight on file to calculate BMI.  Physical Exam  Wt Readings from Last 3 Encounters:  11/20/21 235 lb (106.6 kg)  09/11/21 235 lb 6.4 oz (106.8 kg)  09/08/21 236 lb 15.9 oz (107.5 kg)     ASSESSMENT & PLAN:   ***  PAF: ***.  CHA2DS2-VASc at least 4 (HTN, CVA x2, sex category).  History of CVA:  PFO: Noted on TEE at time of CVA in 2018.  Lower extremity ultrasound negative for DVT at that time and  negative for DVT most recently last month.  ***  HTN: Blood pressure  HLD: LDL  Hypothyroidism/thyroid nodules:  Abnormal chest x-ray:  Bacot use:   {Are you ordering a CV Procedure (e.g. stress test, cath, DCCV, TEE, etc)?   Press F2        :[id number]}     Disposition: F/u with Dr. Okey Dupre or an APP in ***.   Medication Adjustments/Labs and Tests Ordered: Current medicines are reviewed at length with the patient today.  Concerns regarding medicines are outlined above. Medication changes, Labs and Tests ordered today are summarized above and listed in the Patient Instructions accessible in Encounters.   Signed, Eula Listen, PA-C 12/16/2021 7:33 AM     [facility] [place] [address] [place], [place] [address] [phone]

## 2021-12-18 ENCOUNTER — Ambulatory Visit: Payer: Medicaid Other | Admitting: Physician Assistant

## 2021-12-18 ENCOUNTER — Telehealth: Payer: Self-pay | Admitting: Internal Medicine

## 2021-12-18 NOTE — Telephone Encounter (Signed)
Patient would like to do a provider switch because she lives in [place]. Please advise

## 2022-04-27 ENCOUNTER — Ambulatory Visit: Payer: Commercial Managed Care - HMO | Admitting: Dermatology

## 2022-05-01 ENCOUNTER — Other Ambulatory Visit: Payer: Self-pay | Admitting: Internal Medicine

## 2022-05-02 ENCOUNTER — Other Ambulatory Visit: Payer: Self-pay | Admitting: Nurse Practitioner

## 2022-05-04 ENCOUNTER — Telehealth: Payer: Self-pay | Admitting: Internal Medicine

## 2022-05-04 ENCOUNTER — Other Ambulatory Visit: Payer: Self-pay | Admitting: Internal Medicine

## 2022-05-04 ENCOUNTER — Other Ambulatory Visit: Payer: Self-pay

## 2022-05-04 DIAGNOSIS — I480 Paroxysmal atrial fibrillation: Secondary | ICD-10-CM

## 2022-05-04 MED ORDER — APIXABAN 5 MG PO TABS: 5.0000 mg | ORAL_TABLET | Freq: Two times a day (BID) | ORAL | 1 refills | Status: DC

## 2022-05-04 NOTE — Telephone Encounter (Signed)
Refill request

## 2022-05-04 NOTE — Telephone Encounter (Signed)
*  STAT* If patient is at the pharmacy, call can be transferred to refill team.   1. Which medications need to be refilled? (please list name of each medication and dose if known) metoprolol tartrate (LOPRESSOR) 25 MG tablet  2. Which pharmacy/location (including street and city if local pharmacy) is medication to be sent to? [facility] [phone] - [place], [place] AT [address]   3. Do they need a 30 day or 90 day supply? [place]

## 2022-05-04 NOTE — Telephone Encounter (Signed)
Pt is switching to Dr. Ali Lowe at [address] office. Pt currently does not have an appointment scheduled. Routing to [facility] and [facility] refill pools to review for refill.

## 2022-05-04 NOTE — Telephone Encounter (Signed)
Please contact pt for over due 6 month f/u, last seen 05-07-21 by Christell Faith.  Refills pending appt.  Thank you.

## 2022-05-04 NOTE — Telephone Encounter (Signed)
Please contact pt for future appointment. Pt overdue for 6 month f/u. Pt needing refills. 

## 2022-05-04 NOTE — Telephone Encounter (Signed)
Prescription refill request for Eliquis received. Indication: Afib  Last office visit: 05/07/21 ([place])  Scr: 1.06 (11/20/21)  Age: 52 Weight: 106.6kg  Appropriate dose. Refill sent.

## 2022-05-05 ENCOUNTER — Ambulatory Visit (INDEPENDENT_AMBULATORY_CARE_PROVIDER_SITE_OTHER): Payer: Commercial Managed Care - HMO | Admitting: Family

## 2022-05-05 ENCOUNTER — Encounter (HOSPITAL_BASED_OUTPATIENT_CLINIC_OR_DEPARTMENT_OTHER): Payer: Self-pay | Admitting: Family

## 2022-05-05 VITALS — BP 132/82 | HR 85 | Ht 65.0 in | Wt 242.0 lb

## 2022-05-05 DIAGNOSIS — I700 Atherosclerosis of aorta: Secondary | ICD-10-CM

## 2022-05-05 NOTE — Telephone Encounter (Signed)
FYI--please see message below. Thank you!

## 2022-05-05 NOTE — Telephone Encounter (Signed)
Will refill at appointment

## 2022-05-07 MED ORDER — METOPROLOL TARTRATE 25 MG PO TABS: 25.0000 mg | ORAL_TABLET | Freq: Two times a day (BID) | ORAL | 0 refills | Status: DC

## 2022-05-07 NOTE — Telephone Encounter (Signed)
7 day supply sent to requested pharmacy

## 2022-05-07 NOTE — Telephone Encounter (Signed)
She cancelled and rescheduled her appt. If she needs 7 day supply of Metoprolol to get her to her appt we can fill for 7 days.  Loel Dubonnet, NP

## 2022-05-07 NOTE — Addendum Note (Signed)
Addended by: Ernie Hew D on: 05/07/2022 03:49 PM   Modules accepted: Orders

## 2022-05-08 NOTE — Progress Notes (Signed)
Patient cancelled and rescheduled for 05/12/22. No charge encounter.   Loel Dubonnet, NP

## 2022-05-12 ENCOUNTER — Ambulatory Visit (HOSPITAL_BASED_OUTPATIENT_CLINIC_OR_DEPARTMENT_OTHER): Payer: Commercial Managed Care - HMO | Admitting: Family

## 2022-05-12 ENCOUNTER — Encounter (HOSPITAL_BASED_OUTPATIENT_CLINIC_OR_DEPARTMENT_OTHER): Payer: Self-pay

## 2022-07-06 ENCOUNTER — Telehealth: Payer: Self-pay | Admitting: Internal Medicine

## 2022-07-06 NOTE — Telephone Encounter (Signed)
That is fine with me.  Aasha Dina, MD [facility]  

## 2022-07-06 NOTE — Telephone Encounter (Signed)
Pt is requesting a provider switch from Dr. Saunders Revel to Dr. Marlou Porch due to her recent move to [place].

## 2022-07-06 NOTE — Telephone Encounter (Signed)
*  STAT* If patient is at the pharmacy, call can be transferred to refill team.   1. Which medications need to be refilled? (please list name of each medication and dose if known)   atorvastatin (LIPITOR) 80 MG tablet  metoprolol tartrate (LOPRESSOR) 25 MG tablet   2. Which pharmacy/location (including street and city if local pharmacy) is medication to be sent to? [facility] [phone] - [place], [place] [address]   3. Do they need a 30 day or 90 day supply? 90 day

## 2022-07-07 NOTE — Telephone Encounter (Signed)
Left voice mail to schedule appt

## 2022-07-07 NOTE — Telephone Encounter (Signed)
Please contact pt for future appointment. Pt overdue for follow up. Pt needing refills.

## 2022-07-08 ENCOUNTER — Ambulatory Visit: Payer: Medicaid Other | Attending: Family | Admitting: Nurse Practitioner

## 2022-07-08 ENCOUNTER — Encounter: Payer: Self-pay | Admitting: Nurse Practitioner

## 2022-07-08 VITALS — BP 118/84 | HR 81 | Ht 65.0 in | Wt 233.4 lb

## 2022-07-08 DIAGNOSIS — I480 Paroxysmal atrial fibrillation: Secondary | ICD-10-CM | POA: Insufficient documentation

## 2022-07-08 DIAGNOSIS — E785 Hyperlipidemia, unspecified: Secondary | ICD-10-CM | POA: Diagnosis not present

## 2022-07-08 DIAGNOSIS — R0609 Other forms of dyspnea: Secondary | ICD-10-CM | POA: Diagnosis not present

## 2022-07-08 DIAGNOSIS — Z8673 Personal history of transient ischemic attack (TIA), and cerebral infarction without residual deficits: Secondary | ICD-10-CM | POA: Insufficient documentation

## 2022-07-08 DIAGNOSIS — R072 Precordial pain: Secondary | ICD-10-CM | POA: Insufficient documentation

## 2022-07-08 DIAGNOSIS — I10 Essential (primary) hypertension: Secondary | ICD-10-CM | POA: Diagnosis not present

## 2022-07-08 DIAGNOSIS — E039 Hypothyroidism, unspecified: Secondary | ICD-10-CM | POA: Insufficient documentation

## 2022-07-08 DIAGNOSIS — Z720 Tobacco use: Secondary | ICD-10-CM

## 2022-07-08 DIAGNOSIS — Q2112 Patent foramen ovale: Secondary | ICD-10-CM | POA: Diagnosis present

## 2022-07-08 MED ORDER — METOPROLOL TARTRATE 100 MG PO TABS: ORAL_TABLET | ORAL | 0 refills | Status: DC

## 2022-07-08 MED ORDER — ATORVASTATIN CALCIUM 80 MG PO TABS: 80.0000 mg | ORAL_TABLET | Freq: Every day | ORAL | 3 refills | Status: DC

## 2022-07-08 MED ORDER — METOPROLOL TARTRATE 25 MG PO TABS: 25.0000 mg | ORAL_TABLET | Freq: Two times a day (BID) | ORAL | 3 refills | Status: DC

## 2022-07-08 NOTE — Patient Instructions (Addendum)
Medication Instructions:  Metoprolol Tartrate 100 mg take one tablet prior to CT procedure.  *If you need a refill on your cardiac medications before your next appointment, please call your pharmacy*   Lab Work: Your physician recommends that you complete lab work today. BMET  If you have labs (blood work) drawn today and your tests are completely normal, you will receive your results only by: [place] (if you have MyChart) OR A paper copy in the mail If you have any lab test that is abnormal or we need to change your treatment, we will call you to review the results.   Testing/Procedures: Your physician has requested that you have an echocardiogram. Echocardiography is a painless test that uses sound waves to create images of your heart. It provides your doctor with information about the size and shape of your heart and how well your heart's chambers and valves are working. This procedure takes approximately one hour. There are no restrictions for this procedure. Please do NOT wear cologne, perfume, aftershave, or lotions (deodorant is allowed). Please arrive 15 minutes prior to your appointment time.     Your cardiac CT will be scheduled at one of the below locations:   [facility] [address] [place], [place] [address] (336) [place] [address] [place], [place] [address] [phone]  [facility] [place], [place] [address] [phone]  If scheduled at [facility], please arrive at the [facility] En[facility] (Entrance C2) of [facility] 30 minutes prior to test start time. You can use the FREE valet parking offered at entrance C (encouraged to control the heart rate for the test)  Proceed to the [facility] Radiology Department (first floor) to check-in and test prep.  All radiology patients and guests should  use entrance C2 at [facility], accessed from [facility], even though the hospital's physical address listed is [address]    If scheduled at [facility] or [facility], please arrive 15 mins early for check-in and test prep.   Please follow these instructions carefully (unless otherwise directed):  Hold all erectile dysfunction medications at least 3 days (72 hrs) prior to test. (Ie viagra, cialis, sildenafil, tadalafil, etc) We will administer nitroglycerin during this exam.   On the Night Before the Test: Be sure to Drink plenty of water. Do not consume any caffeinated/decaffeinated beverages or chocolate 12 hours prior to your test. Do not take any antihistamines 12 hours prior to your test. If the patient has contrast allergy: Patient will need a prescription for Prednisone and very clear instructions (as follows): Prednisone 50 mg - take 13 hours prior to test Take another Prednisone 50 mg 7 hours prior to test Take another Prednisone 50 mg 1 hour prior to test Take Benadryl 50 mg 1 hour prior to test Patient must complete all four doses of above prophylactic medications. Patient will need a ride after test due to Benadryl.  On the Day of the Test: Drink plenty of water until 1 hour prior to the test. Do not eat any food 1 hour prior to test. You may take your regular medications prior to the test.  Take metoprolol (Lopressor) two hours prior to test. If you take Furosemide/Hydrochlorothiazide/Spironolactone, please HOLD on the morning of the test. FEMALES- please wear underwire-free bra if available, avoid dresses &  tight clothing   *For Clinical Staff only. Please instruct patient the following:* Heart Rate Medication Recommendations for Cardiac CT  Resting HR < 50 bpm  No medication  Resting HR 50-60 bpm and BP >110/50 mmHG   Consider Metoprolol tartrate 25 mg PO 90-120 min prior to scan   Resting HR 60-65 bpm and BP >110/50 mmHG  Metoprolol tartrate 50 mg PO 90-120 minutes prior to scan   Resting HR > 65 bpm and BP >110/50 mmHG  Metoprolol tartrate 100 mg PO 90-120 minutes prior to scan  Consider Ivabradine 10-15 mg PO or a calcium channel blocker for resting HR >60 bpm and contraindication to metoprolol tartrate  Consider Ivabradine 10-15 mg PO in combination with metoprolol tartrate for HR >80 bpm         After the Test: Drink plenty of water. After receiving IV contrast, you may experience a mild flushed feeling. This is normal. On occasion, you may experience a mild rash up to 24 hours after the test. This is not dangerous. If this occurs, you can take Benadryl 25 mg and increase your fluid intake. If you experience trouble breathing, this can be serious. If it is severe call 911 IMMEDIATELY. If it is mild, please call our office. If you take any of these medications: Glipizide/Metformin, Avandament, Glucavance, please do not take 48 hours after completing test unless otherwise instructed.  We will call to schedule your test 2-4 weeks out understanding that some insurance companies will need an authorization prior to the service being performed.   For non-scheduling related questions, please contact the cardiac imaging nurse navigator should you have any questions/concerns: Marchia Bond, Cardiac Imaging Nurse Navigator Gordy Clement, Cardiac Imaging Nurse Navigator [facility] Direct Office Dial: [phone]   For scheduling needs, including cancellations and rescheduling, please call [place], [phone].    Follow-Up: At [facility], you and your health needs are our priority.  As part of our continuing mission to provide you with exceptional heart care, we have created designated Provider Care Teams.  These Care Teams include your primary Cardiologist (physician) and Advanced Practice Providers (APPs -  Physician Assistants  and Nurse Practitioners) who all work together to provide you with the care you need, when you need it.  We recommend signing up for the patient portal called "MyChart".  Sign up information is provided on this After Visit Summary.  MyChart is used to connect with patients for Virtual Visits (Telemedicine).  Patients are able to view lab/test results, encounter notes, upcoming appointments, etc.  Non-urgent messages can be sent to your provider as well.   To learn more about what you can do with MyChart, go to [email or web address].    Your next appointment:   2 month(s)  Provider:   Any App ([place])   Other Instructions

## 2022-07-08 NOTE — Progress Notes (Signed)
Office Visit    Patient Name: Margaret [name] Date of Encounter: 07/08/2022  Primary Care Provider:  [facility] [place] Primary Cardiologist:  Nelva Bush, MD  Chief Complaint    52 year old female with a history of PAF complicated by CVA, PFO, multiple COVID-19 infections, hypertension, hyperlipidemia, hypothyroidism, asthma, intractable headache, tobacco use, and remote substance abuse with marijuana and cocaine who presents for follow-up related to atrial fibrillation.  Past Medical History    Past Medical History:  Diagnosis Date   Anxiety    Arthritis    Asthma    controlled   CVA (cerebral vascular accident) (Margaret [name])    a. 09/2016   Depression    Dysrhythmia    Headache    Hypertension    Hypothyroidism    Morbid obesity (Margaret [name])    PAF (paroxysmal atrial fibrillation) (Margaret [name]) 09/2016   a. 30-day event monitor 6/18: predominant rhythm of sinus with isolated PACs and PVCs.  Two narrow complex tachycardia episodes concerning for A. Fib +/-atrial flutter or SVT.  No prolonged pauses; b. CHADS2VASc = 3 (stroke x 2, female)--> Eliquis.   PFO (patent foramen ovale)    a. TTE 6/18: EF of 60 to 65%, normal wall motion, grade 1 diastolic dysfunction; b. TEE 6/18: evidence of a very small atrial level right to left shunt by bubble study without visualization of ASD or PFO   Substance abuse ([name])    Past use of cocaine    Tobacco abuse    Past Surgical History:  Procedure Laterality Date   FRACTURE SURGERY     TEE WITHOUT CARDIOVERSION N/A 09/08/2016   Procedure: TRANSESOPHAGEAL ECHOCARDIOGRAM (TEE);  Surgeon: Nelva Bush, MD;  Location: [facility] ORS;  Service: Cardiovascular;  Laterality: N/A;   XI ROBOTIC ASSISTED VENTRAL HERNIA N/A 09/13/2019   Procedure: XI ROBOTIC Cholecystectomy, with primary non-mesh repair of Margaret [name];  Surgeon: Ronny Bacon, MD;  Location: [facility] ORS;  Service: General;  Laterality: N/A;    Allergies  No Known  Allergies   Labs/Other Studies Reviewed    The following studies were reviewed today:  Transcranial Doppler 10/2016: Summary:  High intensity transient signals (HITS) were heard with Valsalva,  suggestive of a small Spencer grade I right to left shunt  __________   Outpatient event monitor 09/2016: The patient was enrolled for 30 days. The predominant rhythm was sinus. Isolated PACs and PVCs were identified. 2 episodes of narrow complex tachycardia were identified on 10/02/16 with heart rates up to 230 bpm. While some portions appear regular, there are other periods of irregularity without clear P waves consistent with atrial fibrillation +/- atrial flutter or SVT. No prolonged pauses were identified. Patient triggered events corresponded to narrow complex tachycardia consistent with atrial fibrillation +/- atrial flutter/SVT.   Predominantly sinus rhythm with paroxysmal atrial fibrillation +/- atrial flutter/SVT.   Narrow complex tachycardia was discussed with the patient and treatment for paroxysmal atrial fibrillation initiated while the patient was still being monitored. __________   TEE 09/2016: - Left ventricle: The cavity size was normal. Wall thickness was    normal. Systolic function was normal.  - Aortic valve: No evidence of vegetation. There was trivial    regurgitation.  - Mitral valve: No evidence of vegetation. There was mild    regurgitation.  - Left atrium: No evidence of thrombus in the atrial cavity or    appendage.  - Right atrium: No evidence of thrombus in the atrial cavity or    appendage. No evidence  of thrombus in the appendage.  - Atrial septum: Agitated saline contrast study showed a very small    right-to-left shunt, at baseline or with provocation. Though no    PFO or ASD is clear identified, a tiny shunt cannot be excluded    based on early appearance of a small number of bubbles in the    left ventricle.  - Tricuspid valve: No evidence of  vegetation.  - Pulmonic valve: No evidence of vegetation.   Impressions:   - Evidence of very small atrial level right-to-left shunt by bubble    study without visualization of ASD or PFO.  __________   2D echo 09/2016: - Left ventricle: The cavity size was normal. Wall thickness was    normal. Systolic function was normal. The estimated ejection    fraction was in the range of 60% to 65%. Wall motion was normal;    there were no regional wall motion abnormalities. Doppler    parameters are consistent with abnormal left ventricular    relaxation (grade 1 diastolic dysfunction).   Impressions:   - No cardiac source of emboli was indentified. __________   Carotid artery ultrasound 09/2016: IMPRESSION: Less than 50% stenosis in the right and left internal carotid arteries.   Multiple thyroid nodules are noted. The largest measures 2.2 cm. Dedicated thyroid ultrasound is recommended.      Recent Labs: 09/08/2021: ALT 16 11/20/2021: BUN 9; Creatinine, Ser 1.06; Hemoglobin 12.2; Platelets 386; Potassium 3.3; Sodium 142  Recent Lipid Panel    Component Value Date/Time   CHOL 142 05/07/2021 1522   CHOL 140 09/04/2013 0405   TRIG 157 (H) 05/07/2021 1522   TRIG 137 09/04/2013 0405   HDL 35 (L) 05/07/2021 1522   HDL 30 (L) 09/04/2013 0405   CHOLHDL 4.1 05/07/2021 1522   CHOLHDL 3.3 01/22/2020 1107   VLDL 11 09/07/2016 0442   VLDL 27 09/04/2013 0405   LDLCALC 80 05/07/2021 1522   LDLCALC 67 01/22/2020 1107   LDLCALC 83 09/04/2013 0405   LDLDIRECT 80 05/07/2021 1522    History of Present Illness    51-year-with the above past medical history including PAF complicated by CVA, PFO, multiple COVID-19 infections, hypertension, hyperlipidemia, hypothyroidism, asthma, intractable headache, tobacco use, and remote substance abuse with marijuana and cocaine.  She was hospitalized in June 2018 with a right frontal lobe CVA.  Echocardiogram demonstrated EF 60 to 65%, normal wall motion,  G1 DD, trivial mitral valve regurgitation.  Carotid ultrasound demonstrated less than 50% stenosis in the bilateral ICAs.  TEE demonstrated very small atrial level right to left shunt by bubble study without visualization of ASD or PFO.  Outpatient cardiac monitoring demonstrated predominantly sinus rhythm with 2 episodes of narrow complex tachycardia, some episodes without clear P waves consistent with possible atrial fibrillation versus flutter versus SVT.  She was started on Xarelto, this was later transitioned to Eliquis.  Transcranial Doppler was suggestive of a small Spencer grade 1 right to left shunt.  She was seen in the ED in August 2023 with chest pain.  Workup was negative and she was treated for an upper respiratory infection.  She was last seen in the office on 05/07/2021 and was stable from a cardiac standpoint.  She transitioned her cardiologist from Dr. Saunders Revel to Dr. Marlou Porch as she recently moved to [facility].   She presents today for follow-up accompanied by her daughter. Since her last visit she has been stable overall from a cardiac standpoint.  She does note  overall decreased energy, intermittent pain in her right arm.  She also notes increased dyspnea on exertion, intermittent chest pain which she describes as a sharp pain, occurs at rest.  She has intermittent lightheadedness.  She denies presyncope, syncope. She also notes an increase in personal stress.    Home Medications    Current Outpatient Medications  Medication Sig Dispense Refill   apixaban (ELIQUIS) 5 MG TABS tablet Take 1 tablet (5 mg total) by mouth 2 (two) times daily. 180 tablet 1   atorvastatin (LIPITOR) 80 MG tablet Take 1 tablet (80 mg total) by mouth daily. 90 tablet 0   metoprolol tartrate (LOPRESSOR) 25 MG tablet Take 1 tablet (25 mg total) by mouth 2 (two) times daily. Please keep your upcoming appointment for 90 day refills. 14 tablet 0   levothyroxine (SYNTHROID) 125 MCG tablet Take 1 tablet (125 mcg total) by  mouth daily before breakfast. FURTHER REFILLS WILL NEED TO BE SENT BY PCP 30 tablet 1   No current facility-administered medications for this visit.     Review of Systems    She denies palpitations, pnd, orthopnea, n, v, dizziness, syncope, edema, weight gain, or early satiety. All other systems reviewed and are otherwise negative except as noted above.   Physical Exam    VS:  BP 118/84 (BP Location: Right Arm, Patient Position: Sitting, Cuff Size: Large)   Pulse 81   Ht 5\' 5"  (1.651 m)   Wt 233 lb 6.4 oz (105.9 kg)   LMP 05/07/2016   SpO2 95%   BMI 38.84 kg/m  GEN: Well nourished, well developed, in no acute distress. HEENT: normal. Neck: Supple, no JVD, carotid bruits, or masses. Cardiac: RRR, no murmurs, rubs, or gallops. No clubbing, cyanosis, edema.  Radials/DP/PT 2+ and equal bilaterally.  Respiratory:  Respirations regular and unlabored, clear to auscultation bilaterally. GI: Soft, nontender, nondistended, BS + x 4. MS: no deformity or atrophy. Skin: warm and dry, no rash. Neuro:  Strength and sensation are intact. Psych: Normal affect.  Accessory Clinical Findings    ECG personally reviewed by me today -NSR, 81 bpm- no acute changes.   Lab Results  Component Value Date   WBC 9.9 11/20/2021   HGB 12.2 11/20/2021   HCT 38.6 11/20/2021   MCV 83.4 11/20/2021   PLT 386 11/20/2021   Lab Results  Component Value Date   CREATININE 1.06 (H) 11/20/2021   BUN 9 11/20/2021   NA 142 11/20/2021   K 3.3 (L) 11/20/2021   CL 107 11/20/2021   CO2 28 11/20/2021   Lab Results  Component Value Date   ALT 16 09/08/2021   AST 22 09/08/2021   ALKPHOS 95 09/08/2021   BILITOT 0.9 09/08/2021   Lab Results  Component Value Date   CHOL 142 05/07/2021   HDL 35 (L) 05/07/2021   LDLCALC 80 05/07/2021   LDLDIRECT 80 05/07/2021   TRIG 157 (H) 05/07/2021   CHOLHDL 4.1 05/07/2021    Lab Results  Component Value Date   HGBA1C 6.1 (H) 01/22/2020    Assessment & Plan    1.  Chest pain/Dyspnea on exertion: She notes a recent increase in dyspnea on exertion, as well as intermittent chest pain that occurs at rest, she describes it as a sharp pain.  She also notes decreased energy.  Symptoms are overall atypical, however, given significant past medical history and through shared decision making, will pursue coronary CT angiogram for further restratification.  Generally euvolemic and well compensated on exam  though she does report stable intermittent dependent bilateral lower extremity edema.  Will update echo.  BMET today.  2. PAF: Maintaining NSR.  Continue metoprolol, Eliquis.  3. Hypertension: BP well controlled. Continue current antihypertensive regimen.   4. Hyperlipidemia: LDL was 80 in 05/2021.  Consider repeat lipids, LFTs at next follow-up visit.  Continue Lipitor.  5. History of CVA: No recurrence.  Continue Eliquis, Lipitor.  6. PFO: Noted on TEE at time of CVA in 2018.  Lower extremity ultrasound negative for DVT at the time.  No plans for closure.  She remains on indefinite DOAC.  7. Hypothyroidism: TSH was 3.86 in 05/2021.  Monitor managed per PCP.  Continue levothyroxine.  8. Tobacco use: She continues to smoke.  Full cessation advised.  9. Disposition:  Follow-up in 2 months.      Lenna Sciara, NP 07/08/2022, 2:07 PM

## 2022-07-09 LAB — BASIC METABOLIC PANEL
BUN/Creatinine Ratio: 7 — ABNORMAL LOW (ref 9–23)
BUN: 5 mg/dL — ABNORMAL LOW (ref 6–24)
CO2: 25 mmol/L (ref 20–29)
Calcium: 9.5 mg/dL (ref 8.7–10.2)
Chloride: 105 mmol/L (ref 96–106)
Creatinine, Ser: 0.72 mg/dL (ref 0.57–1.00)
Glucose: 94 mg/dL (ref 70–99)
Potassium: 4.1 mmol/L (ref 3.5–5.2)
Sodium: 144 mmol/L (ref 134–144)
eGFR: 102 mL/min/{1.73_m2} (ref >59)

## 2022-07-10 ENCOUNTER — Telehealth (HOSPITAL_COMMUNITY): Payer: Self-pay | Admitting: Emergency Medicine

## 2022-07-10 NOTE — Telephone Encounter (Signed)
Reaching out to patient to offer assistance regarding upcoming cardiac imaging study; pt verbalizes understanding of appt date/time, parking situation and where to check in, pre-test NPO status and medications ordered, and verified current allergies; name and call back number provided for further questions should they arise [name] RN Navigator Cardiac Imaging [facility] [phone] office [phone] cell  Arrival 300  100mg  metoprolol tartrate

## 2022-07-13 ENCOUNTER — Ambulatory Visit (HOSPITAL_COMMUNITY): Admission: RE | Admit: 2022-07-13 | Payer: Medicaid Other | Source: Ambulatory Visit

## 2022-07-21 ENCOUNTER — Telehealth (HOSPITAL_COMMUNITY): Payer: Self-pay | Admitting: *Deleted

## 2022-07-21 NOTE — Telephone Encounter (Signed)
Reaching out to patient to offer assistance regarding upcoming cardiac imaging study; pt verbalizes understanding of appt date/time, parking situation and where to check in, pre-test NPO status and medications ordered, and verified current allergies; name and call back number provided for further questions should they arise  Larey Brick RN Navigator Cardiac Imaging [facility] [phone] office [phone] cell  Patient to take  metoprolol tartrate two hours prior to your cardiac CT scan.  She is aware to arrive at 3pm.

## 2022-07-22 ENCOUNTER — Ambulatory Visit (HOSPITAL_COMMUNITY): Admission: RE | Admit: 2022-07-22 | Payer: Medicaid Other | Source: Ambulatory Visit

## 2022-07-23 ENCOUNTER — Telehealth (HOSPITAL_COMMUNITY): Payer: Self-pay | Admitting: *Deleted

## 2022-07-23 NOTE — Telephone Encounter (Signed)
Attempted to call patient regarding upcoming cardiac CT appointment. °Left message on voicemail with name and callback number ° °Chirstine Defrain RN Navigator Cardiac Imaging °[facility] °[phone] Office °[phone] Cell ° °

## 2022-07-24 ENCOUNTER — Ambulatory Visit (HOSPITAL_COMMUNITY)
Admission: RE | Admit: 2022-07-24 | Discharge: 2022-07-24 | Disposition: A | Discharge status: Home / Self Care | Payer: Medicaid Other | Source: Ambulatory Visit | Attending: Nurse Practitioner | Admitting: Nurse Practitioner

## 2022-07-24 DIAGNOSIS — R0609 Other forms of dyspnea: Secondary | ICD-10-CM | POA: Diagnosis not present

## 2022-07-24 DIAGNOSIS — R072 Precordial pain: Secondary | ICD-10-CM | POA: Diagnosis not present

## 2022-07-24 MED ORDER — IOHEXOL 350 MG/ML SOLN: 75.0000 mL | Freq: Once | INTRAVENOUS | Status: DC | PRN

## 2022-07-24 MED ORDER — IOHEXOL 350 MG/ML SOLN
75.0000 mL | Freq: Once | INTRAVENOUS | Status: AC | PRN
  Administered 2022-07-24: 75 mL via INTRAVENOUS

## 2022-07-24 MED ORDER — NITROGLYCERIN 0.4 MG SL SUBL
0.8000 mg | SUBLINGUAL_TABLET | Freq: Once | SUBLINGUAL | Status: AC
  Administered 2022-07-24: 0.8 mg via SUBLINGUAL

## 2022-07-24 MED ORDER — NITROGLYCERIN 0.4 MG SL SUBL
SUBLINGUAL_TABLET | SUBLINGUAL | Status: AC
  Filled 2022-07-24: qty 2

## 2022-07-27 ENCOUNTER — Telehealth: Payer: Self-pay | Admitting: Internal Medicine

## 2022-07-27 NOTE — Telephone Encounter (Signed)
Patient is requesting a call back to discuss CT results. 

## 2022-07-28 NOTE — Telephone Encounter (Signed)
Left a detailed message for pt. I will call pt back when results have been resulted.

## 2022-07-29 ENCOUNTER — Telehealth: Payer: Self-pay

## 2022-07-29 NOTE — Telephone Encounter (Signed)
Attempted to contact pt. Call was dropped. Called pt a second time and lmom. Waiting on a return call to discuss CT results.

## 2022-08-03 ENCOUNTER — Ambulatory Visit (HOSPITAL_COMMUNITY): Payer: Medicaid Other | Attending: Nurse Practitioner

## 2022-08-07 ENCOUNTER — Encounter (HOSPITAL_COMMUNITY): Payer: Self-pay | Admitting: Nurse Practitioner

## 2022-08-12 NOTE — Telephone Encounter (Signed)
Mailing letter after several attempts made to discuss results.

## 2022-09-06 NOTE — Progress Notes (Deleted)
Office Visit    Patient Name: Margaret [name] Date of Encounter: 09/06/2022  Primary Care Provider:  [facility] Primary Cardiologist:  [name], MD Primary Electrophysiologist: None   Past Medical History    Past Medical History:  Diagnosis Date   Anxiety    Arthritis    Asthma    controlled   CVA (cerebral vascular accident) (HCC)    a. 09/2016   Depression    Dysrhythmia    Headache    Hypertension    Hypothyroidism    Morbid obesity (HCC)    PAF (paroxysmal atrial fibrillation) (HCC) 09/2016   a. 30-day event monitor 6/18: predominant rhythm of sinus with isolated PACs and PVCs.  Two narrow complex tachycardia episodes concerning for A. Fib +/-atrial flutter or SVT.  No prolonged pauses; b. CHADS2VASc = 3 (stroke x 2, female)--> Eliquis.   PFO (patent foramen ovale)    a. TTE 6/18: EF of 60 to 65%, normal wall motion, grade 1 diastolic dysfunction; b. TEE 6/18: evidence of a very small atrial level right to left shunt by bubble study without visualization of ASD or PFO   Substance abuse (HCC)    Past use of cocaine    Tobacco abuse    Past Surgical History:  Procedure Laterality Date   FRACTURE SURGERY     TEE WITHOUT CARDIOVERSION N/A 09/08/2016   Procedure: TRANSESOPHAGEAL ECHOCARDIOGRAM (TEE);  Surgeon: [name], MD;  Location: [facility] ORS;  Service: Cardiovascular;  Laterality: N/A;   XI ROBOTIC ASSISTED VENTRAL HERNIA N/A 09/13/2019   Procedure: XI ROBOTIC Cholecystectomy, with primary non-mesh repair of EPIGATRIC HERNIA;  Surgeon: Campbell Lerner, MD;  Location: [facility] ORS;  Service: General;  Laterality: N/A;    Allergies  No Known Allergies   History of Present Illness    Margaret [name]  is a 52 year old female with a PMH of  PAF complicated by CVA, patent foramen ovale, COVID in 05/2019, 05/2020 and 03/2021, HTN, HLD, hypothyroidism, asthma, intractable headache, tobacco use, and remote substance abuse with marijuana and  cocaine who presents today for 54-month follow-up.  Margaret [name]  was initially seen in 2018 for right frontal CVA.  She had a 2D echo completed that showed EF of 60 to 65% with no RWMA and grade 1 DD.  Carotid ultrasounds were also completed showing less than 50% stenosis bilaterally.  TEE completed demonstrated small atrial level right to left shunting by bubble study W/O ASD or PFO.  He had lower extremity US performed that were negative for DVT bilaterally.  She completed an event monitor that showed predominantly sinus rhythm with 2 episodes of narrow complex tachycardia consistent with possible AF.  She was started on Xarelto and transition to Eliquis.  She was transition from Dr. Okey Dupre to Dr. Anne Fu when she moved to [facility] in 2023.  She was seen most recently on 07/08/2022 by Bernadene Person, NP and reported being stable overall from a cardiac standpoint.  She endorsed decreased energy and intermittent pain in her right arm and chest.  She underwent coronary CTA that showed minimal nonobstructive CAD with calcium score of 0.  Since last being seen in the office patient reports***.  Patient denies chest pain, palpitations, dyspnea, PND, orthopnea, nausea, vomiting, dizziness, syncope, edema, weight gain, or early satiety.     ***Notes:  Home Medications    Current Outpatient Medications  Medication Sig Dispense Refill   apixaban (ELIQUIS) 5 MG TABS tablet Take 1  tablet (5 mg total) by mouth 2 (two) times daily. 180 tablet 1   atorvastatin (LIPITOR) 80 MG tablet Take 1 tablet (80 mg total) by mouth daily. 90 tablet 3   levothyroxine (SYNTHROID) 125 MCG tablet Take 1 tablet (125 mcg total) by mouth daily before breakfast. FURTHER REFILLS WILL NEED TO BE SENT BY PCP 30 tablet 1   metoprolol tartrate (LOPRESSOR) 100 MG tablet Take 1 tablet 2 hours before CT 1 tablet 0   metoprolol tartrate (LOPRESSOR) 25 MG tablet Take 1 tablet (25 mg total) by mouth 2 (two) times daily. Please keep your upcoming  appointment for 90 day refills. 180 tablet 3   No current facility-administered medications for this visit.     Review of Systems  Please see the history of present illness.    (+)*** (+)***  All other systems reviewed and are otherwise negative except as noted above.  Physical Exam    Wt Readings from Last 3 Encounters:  07/08/22 233 lb 6.4 oz (105.9 kg)  05/05/22 242 lb (109.8 kg)  11/20/21 235 lb (106.6 kg)   QM:VHQIO were no vitals filed for this visit.,There is no height or weight on file to calculate BMI.  Constitutional:      Appearance: Healthy appearance. Not in distress.  Neck:     Vascular: JVD normal.  Pulmonary:     Effort: Pulmonary effort is normal.     Breath sounds: No wheezing. No rales. Diminished in the bases Cardiovascular:     Normal rate. Regular rhythm. Normal S1. Normal S2.      Murmurs: There is no murmur.  Edema:    Peripheral edema absent.  Abdominal:     Palpations: Abdomen is soft non tender. There is no hepatomegaly.  Skin:    General: Skin is warm and dry.  Neurological:     General: No focal deficit present.     Mental Status: Alert and oriented to person, place and time.     Cranial Nerves: Cranial nerves are intact.  EKG/LABS/ Recent Cardiac Studies    ECG personally reviewed by me today - ***  Cardiac Studies & Procedures       ECHOCARDIOGRAM  ECHOCARDIOGRAM COMPLETE 09/07/2016  Narrative *[facility]* [address] [place], [place] [address] [phone]  ------------------------------------------------------------------- Transthoracic Echocardiography  Patient:    Margaret [name] MR #:       [id number] Study Date: 09/07/2016 Gender:     F Age:        45 Height:     167.6 cm Weight:     97.1 kg BSA:        2.16 m^2 Pt. Status: Room:  ATTENDING    Oretha Caprice REFERRING    Barney Drain SONOGRAPHER  Dorene Sorrow Hege RDCS PERFORMING   [facility],  [facility]  cc:  ------------------------------------------------------------------- LV EF: 60% -   65%  ------------------------------------------------------------------- Indications:      Stroke 434.91.  ------------------------------------------------------------------- History:   PMH:  Asthma, thyroid disease.  ------------------------------------------------------------------- Study Conclusions  - Left ventricle: The cavity size was normal. Wall thickness was normal. Systolic function was normal. The estimated ejection fraction was in the range of 60% to 65%. Wall motion was normal; there were no regional wall motion abnormalities. Doppler parameters are consistent with abnormal left ventricular relaxation (grade 1 diastolic dysfunction).  Impressions:  - No cardiac source of emboli was indentified.  ------------------------------------------------------------------- Study data:   Study status:  Routine.  Procedure:  The patient reported no pain pre or post test. Transthoracic echocardiography. Image quality was adequate.          Transthoracic echocardiography.  M-mode, complete 2D, spectral Doppler, and color Doppler.  Birthdate:  Patient birthdate: 1970/05/30.  Age:  Patient is 52 yr old.  Sex:  Gender: female.    BMI: 34.6 kg/m^2.  Blood pressure:     127/81  Patient status:  Inpatient.  Study date: Study date: 09/07/2016. Study time: 10:52 AM.  Location:  Bedside.  -------------------------------------------------------------------  ------------------------------------------------------------------- Left ventricle:  The cavity size was normal. Wall thickness was normal. Systolic function was normal. The estimated ejection fraction was in the range of 60% to 65%. Wall motion was normal; there were no regional wall motion abnormalities. Doppler parameters are consistent with abnormal left ventricular relaxation (grade 1 diastolic  dysfunction).  ------------------------------------------------------------------- Aortic valve:   Trileaflet; normal thickness leaflets. Mobility was not restricted.  Doppler:  Transvalvular velocity was within the normal range. There was no stenosis. There was no regurgitation. Peak velocity ratio of LVOT to aortic valve: 0.73. Valve area (Vmax): 2.28 cm^2. Indexed valve area (Vmax): 1.05 cm^2/m^2. Peak gradient (S): 7 mm Hg.  ------------------------------------------------------------------- Aorta:  Aortic root: The aortic root was normal in size.  ------------------------------------------------------------------- Mitral valve:   Structurally normal valve.   Mobility was not restricted.  Doppler:  Transvalvular velocity was within the normal range. There was no evidence for stenosis. There was trivial regurgitation.    Peak gradient (D): 3 mm Hg.  ------------------------------------------------------------------- Left atrium:  The atrium was normal in size.  ------------------------------------------------------------------- Right ventricle:  The cavity size was normal. Wall thickness was normal. Systolic function was normal.  ------------------------------------------------------------------- Pulmonic valve:    Doppler:  Transvalvular velocity was within the normal range. There was no evidence for stenosis.  ------------------------------------------------------------------- Tricuspid valve:   Structurally normal valve.    Doppler: Transvalvular velocity was within the normal range. There was trivial regurgitation.  ------------------------------------------------------------------- Pulmonary artery:   The main pulmonary artery was normal-sized. Systolic pressure was within the normal range.  ------------------------------------------------------------------- Right atrium:  The atrium was normal in  size.  ------------------------------------------------------------------- Pericardium:  There was no pericardial effusion.  ------------------------------------------------------------------- Systemic veins: Inferior vena cava: The vessel was normal in size.  ------------------------------------------------------------------- Measurements  Left ventricle                           Value          Reference LV ID, ED, PLAX chordal          (L)     38.3  mm       43 - 52 LV ID, ES, PLAX chordal          (L)     21.5  mm       23 - 38 LV fx shortening, PLAX chordal           44    %        >=29 LV PW thickness, ED                      8.96  mm       ---------- IVS/LV PW ratio, ED                      1.21           <=1.3 LV e&', lateral  9.57  cm/s     ---------- LV E/e&', lateral                         8.77           ---------- LV e&', medial                            7.51  cm/s     ---------- LV E/e&', medial                          11.17          ---------- LV e&', average                           8.54  cm/s     ---------- LV E/e&', average                         9.82           ----------  Ventricular septum                       Value          Reference IVS thickness, ED                        10.8  mm       ----------  LVOT                                     Value          Reference LVOT ID, S                               20    mm       ---------- LVOT area                                3.14  cm^2     ---------- LVOT peak velocity, S                    97.5  cm/s     ----------  Aortic valve                             Value          Reference Aortic valve peak velocity, S            134   cm/s     ---------- Aortic peak gradient, S                  7     mm Hg    ---------- Velocity ratio, peak, LVOT/AV            0.73           ---------- Aortic valve area, peak velocity         2.28  cm^2     ---------- Aortic valve area/bsa, peak  1.05  cm^2/m^2 ---------- velocity  Aorta                                    Value          Reference Aortic root ID, ED                       29    mm       ----------  Left atrium                              Value          Reference LA ID, A-P, ES                           25    mm       ---------- LA ID/bsa, A-P                           1.16  cm/m^2   <=2.2 LA volume, S                             46.1  ml       ---------- LA volume/bsa, S                         21.3  ml/m^2   ---------- LA volume, ES, 1-p A4C                   40    ml       ---------- LA volume/bsa, ES, 1-p A4C               18.5  ml/m^2   ---------- LA volume, ES, 1-p A2C                   54.7  ml       ---------- LA volume/bsa, ES, 1-p A2C               25.3  ml/m^2   ----------  Mitral valve                             Value          Reference Mitral E-wave peak velocity              83.9  cm/s     ---------- Mitral A-wave peak velocity              99.9  cm/s     ---------- Mitral deceleration time                 211   ms       150 - 230 Mitral peak gradient, D                  3     mm Hg    ---------- Mitral E/A ratio, peak                   0.8            ----------  Right atrium  Value          Reference RA ID, S-I, ES, A4C                      44.4  mm       34 - 49 RA area, ES, A4C                         11.6  cm^2     8.3 - 19.5 RA volume, ES, A/L                       24.6  ml       ---------- RA volume/bsa, ES, A/L                   11.4  ml/m^2   ----------  Right ventricle                          Value          Reference RV ID, ED, PLAX                          30.6  mm       19 - 38 TAPSE                                    25.2  mm       ---------- RV s&', lateral, S                        15.7  cm/s     ----------  Pulmonic valve                           Value          Reference Pulmonic valve peak velocity, S          83.4  cm/s      ----------  Legend: (L)  and  (H)  mark values outside specified reference range.  ------------------------------------------------------------------- Prepared and Electronically Authenticated by  Lorine Bears, MD 2018-06-04T13:39:31   TEE  ECHO TEE 09/08/2016  Narrative *[facility]* [address] [place], [place] [address] [phone]  ------------------------------------------------------------------- Transesophageal Echocardiography  Patient:    Sydnee, Ridinger MR #:       [id number] Study Date: 09/08/2016 Gender:     F Age:        45 Height:     167.6 cm Weight:     97.1 kg BSA:        2.16 m^2 Pt. Status: Room:       107A  Beverely Low REFERRING    Dunn, Ryan M ATTENDING    Gouru, Aruna ADMITTING    Altamese Dilling Md SONOGRAPHER  Cristela Blue RDCS PERFORMING   [facility], [facility]  cc:  -------------------------------------------------------------------  ------------------------------------------------------------------- Indications:      Stroke 434.91.  ------------------------------------------------------------------- Study Conclusions  - Left ventricle: The cavity size was normal. Wall thickness was normal. Systolic function was normal. - Aortic valve: No evidence of vegetation. There was trivial regurgitation. - Mitral valve: No evidence of vegetation. There was mild regurgitation. - Left atrium: No evidence of thrombus in the  atrial cavity or appendage. - Right atrium: No evidence of thrombus in the atrial cavity or appendage. No evidence of thrombus in the appendage. - Atrial septum: Agitated saline contrast study showed a very small right-to-left shunt, at baseline or with provocation. Though no PFO or ASD is clear identified, a tiny shunt cannot be excluded based on early appearance of a small number of bubbles in the left ventricle. - Tricuspid valve: No evidence of vegetation. - Pulmonic valve:  No evidence of vegetation.  Impressions:  - Evidence of very small atrial level right-to-left shunt by bubble study without visualization of ASD or PFO.  ------------------------------------------------------------------- Study data:   Procedure:  Initial setup. The patient was brought to the laboratory. Surface ECG leads were monitored. Sedation. Conscious sedation was administered. Transesophageal echocardiography. Topical anesthesia was obtained using viscous lidocaine. A transesophageal probe was inserted by the attending cardiologist. Image quality was adequate.  Study completion:  The patient tolerated the procedure well. There were no complications. Diagnostic transesophageal echocardiography.  2D and color Doppler.  Birthdate:  Patient birthdate: 1971/03/09.  Age:  Patient is 52 yr old.  Sex:  Gender: female.    BMI: 34.6 kg/m^2.  Study date:  Study date: 09/08/2016. Study time: 10:02 AM.  -------------------------------------------------------------------  ------------------------------------------------------------------- Left ventricle:  The cavity size was normal. Wall thickness was normal. Systolic function was normal.  ------------------------------------------------------------------- Aortic valve:   Structurally normal valve.   Cusp separation was normal.  No evidence of vegetation.  Doppler:  There was trivial regurgitation.  ------------------------------------------------------------------- Aorta:  The aorta was normal, not dilated, and non-diseased.  ------------------------------------------------------------------- Mitral valve:   Structurally normal valve.   Leaflet separation was normal.  No evidence of vegetation.  Doppler:  There was mild regurgitation.  ------------------------------------------------------------------- Left atrium:  The atrium was normal in size.  No evidence of thrombus in the atrial cavity or  appendage.  ------------------------------------------------------------------- Atrial septum:   Agitated saline contrast study showed a very small right-to-left shunt, at baseline or with provocation.  ------------------------------------------------------------------- Right ventricle:  The cavity size was normal. Systolic function was normal.  ------------------------------------------------------------------- Pulmonic valve:    Structurally normal valve.   Cusp separation was normal.  No evidence of vegetation.  Doppler:  There was mild regurgitation.  ------------------------------------------------------------------- Tricuspid valve:   Structurally normal valve.   Leaflet separation was normal.  No evidence of vegetation.  Doppler:  There was mild regurgitation.  ------------------------------------------------------------------- Right atrium:  The atrium was normal in size.  No evidence of thrombus in the atrial cavity or appendage.  No evidence of thrombus in the appendage. The appendage was normal sized.  ------------------------------------------------------------------- Post procedure conclusions Ascending Aorta:  - The aorta was normal, not dilated, and non-diseased.  ------------------------------------------------------------------- Measurements  Aorta                  Value Aortic root ID, ED     32    mm  Legend: (L)  and  (H)  mark values outside specified reference range.  ------------------------------------------------------------------- Prepared and Electronically Authenticated by  [name], MD 2018-06-05T13:57:37   MONITORS  CARDIAC EVENT MONITOR 11/03/2016  Narrative  The patient was enrolled for 30 days.  The predominant rhythm was sinus.  Isolated PACs and PVCs were identified.  2 episodes of narrow complex tachycardia were identified on 10/02/16 with heart rates up to 230 bpm. While some portions appear regular, there are other  periods of irregularity without clear P waves consistent with atrial fibrillation +/- atrial flutter or SVT.  No prolonged pauses were identified.  Patient triggered events corresponded to narrow complex tachycardia consistent with atrial fibrillation +/- atrial flutter/SVT.  Predominantly sinus rhythm with paroxysmal atrial fibrillation +/- atrial flutter/SVT.  Narrow complex tachycardia was discussed with the patient and treatment for paroxysmal atrial fibrillation initiated while the patient was still being monitored.   CT SCANS  CT CORONARY MORPH W/CTA COR W/SCORE 07/26/2022  Addendum 07/26/2022  7:26 PM ADDENDUM REPORT: 07/26/2022 19:23  EXAM: OVER-READ INTERPRETATION  CT CHEST  The following report is an over-read performed by radiologist Dr. Curly Shores [facility] on 07/26/2022. This over-read does not include interpretation of cardiac or coronary anatomy or pathology. The coronary CTA interpretation by the cardiologist is attached.  COMPARISON:  None.  FINDINGS: Cardiovascular: Findings discussed in the body of the report. No incidental pulmonary artery filling defects identified to suggest PE  Mediastinum/Nodes: No suspicious adenopathy identified. Imaged mediastinal structures are unremarkable.  Lungs/Pleura: Dependent basilar subsegmental atelectasis. Lungs are otherwise clear. No pleural effusion or pneumothorax.  Upper Abdomen: No acute abnormality.  Musculoskeletal: No chest wall abnormality. No acute or significant osseous findings.  IMPRESSION: No significant extracardiac incidental findings identified.   Electronically Signed By: Layla Maw M.D. On: 07/26/2022 19:23  Narrative CLINICAL DATA:  22F with chest pain  EXAM: Cardiac/Coronary CTA  TECHNIQUE: The patient was scanned on a [employer].  FINDINGS: A 100 kV prospective scan was triggered in the descending thoracic aorta at 111 HU's. Axial  non-contrast 3 mm slices were carried out through the heart. The data set was analyzed on a dedicated work station and scored using the Agatson method. Gantry rotation speed was 250 msecs and collimation was .6 mm. No beta blockade and 0.8 mg of sl NTG was given. The 3D data set was reconstructed in 5% intervals of the 35-75% of the R-R cycle. Phases were analyzed on a dedicated work station using MPR, MIP and VRT modes. The patient received 100 cc of contrast.  Coronary Arteries:  Normal coronary origin.  Right dominance.  RCA is a large dominant artery that gives rise to PDA and PLA. There is no plaque.  Left main is a large artery that gives rise to LAD and LCX arteries.  LAD is a large vessel. Noncalcified plaque in mid LAD causes minimal (0-24%) stenosis  LCX is a small, non-dominant artery.  There is no plaque.  Noncalcified plaque in ramus branch causes 0-24% stenosis  Other findings:  Left Ventricle: Normal size  Left Atrium: Mild enlargement  Pulmonary Veins: Normal configuration  Right Ventricle: Normal size  Right Atrium: Normal size  Cardiac valves: No calcifications  Thoracic aorta: Normal size  Pulmonary Arteries: Normal size  Systemic Veins: Normal drainage  Pericardium: Normal thickness  IMPRESSION: 1. Coronary calcium score of 0.  2. Total plaque volume 31mm3 which is 63rd percentile for age and sex-matched controls (calcified plaque 108mm3; noncalcified plaque 22mm3). TPV is mild  3. Normal coronary origin with right dominance.  4. Nonobstructive CAD, with noncalcified plaque in mid LAD and ramus branch causing minimal (0-24%) stenosis  CAD-RADS 1. Minimal non-obstructive CAD (0-24%). Consider non-atherosclerotic causes of chest pain. Consider preventive therapy and risk factor modification.  Electronically Signed: By: Epifanio Lesches M.D. On: 07/26/2022 14:06          Risk Assessment/Calculations:   {Does this patient have  ATRIAL FIBRILLATION?:[id number]}        Lab Results  Component Value Date   WBC 9.9 11/20/2021  HGB 12.2 11/20/2021   HCT 38.6 11/20/2021   MCV 83.4 11/20/2021   PLT 386 11/20/2021   Lab Results  Component Value Date   CREATININE 0.72 07/08/2022   BUN 5 (L) 07/08/2022   NA 144 07/08/2022   K 4.1 07/08/2022   CL 105 07/08/2022   CO2 25 07/08/2022   Lab Results  Component Value Date   ALT 16 09/08/2021   AST 22 09/08/2021   ALKPHOS 95 09/08/2021   BILITOT 0.9 09/08/2021   Lab Results  Component Value Date   CHOL 142 05/07/2021   HDL 35 (L) 05/07/2021   LDLCALC 80 05/07/2021   LDLDIRECT 80 05/07/2021   TRIG 157 (H) 05/07/2021   CHOLHDL 4.1 05/07/2021    Lab Results  Component Value Date   HGBA1C 6.1 (H) 01/22/2020     Assessment & Plan    1.  Nonobstructive CAD: -Patient underwent recent coronary CTA that showed calcium score of 0 and mild nonobstructive disease. -Today patient reports***  2.  Chest pain: -Today patient reports***  3.  Paroxysmal atrial fibrillation: -Patient currently on rate control -Patient's last creatinine was***and hemoglobin was*** -Continue Eliquis 5 mg twice daily  4.  Essential hypertension: -Patient's blood pressure today was*** -Continue***  5.  History of CVA: -Continue GDMT with***  6.  History of PFO: -Seen on TEE following CVA 2018 -Patient continued on indefinite AC       Disposition: Follow-up with [name], MD or APP in *** months {Are you ordering a CV Procedure (e.g. stress test, cath, DCCV, TEE, etc)?   Press F2        :[id number]}   Medication Adjustments/Labs and Tests Ordered: Current medicines are reviewed at length with the patient today.  Concerns regarding medicines are outlined above.   Signed, Napoleon Form, Leodis Rains, NP 09/06/2022, 7:18 PM [facility]

## 2022-09-07 ENCOUNTER — Encounter: Payer: Self-pay | Admitting: Nurse Practitioner

## 2022-09-07 ENCOUNTER — Ambulatory Visit: Payer: Medicaid Other | Attending: Nurse Practitioner | Admitting: Nurse Practitioner

## 2022-09-07 DIAGNOSIS — I10 Essential (primary) hypertension: Secondary | ICD-10-CM

## 2022-09-07 DIAGNOSIS — R079 Chest pain, unspecified: Secondary | ICD-10-CM

## 2022-09-07 DIAGNOSIS — Q2112 Patent foramen ovale: Secondary | ICD-10-CM

## 2022-09-07 DIAGNOSIS — I2510 Atherosclerotic heart disease of native coronary artery without angina pectoris: Secondary | ICD-10-CM

## 2022-09-07 DIAGNOSIS — Z8673 Personal history of transient ischemic attack (TIA), and cerebral infarction without residual deficits: Secondary | ICD-10-CM

## 2022-09-07 DIAGNOSIS — I480 Paroxysmal atrial fibrillation: Secondary | ICD-10-CM

## 2022-09-30 NOTE — Progress Notes (Deleted)
Patient ID: Margaret [name], female   DOB: Apr 01, 1971, 52 y.o.   MRN: [id number]  Chief Complaint: Recurrent incisional hernia  History of Present Illness  *** Margaret [name] is a 52 y.o. female with limited pain around a laparoscopic cholecystectomy incision where a prior hernia defect was primarily repaired with suture electing not to utilize mesh at that time.  Due to various social reasons, she had had an operation scheduled to proceed with a mesh repair, but remains to be somewhat reluctant to proceed with the same.  Prior schedule procedures had been canceled for undocumented reasons.  As this is a recurrent hernia, I am encouraging her to consider proceeding with a mesh repair.  She does desire to pursue through a nonmesh alternative and I instructed her that it would be best in that situation to be at a place where she has lost this significant amount of weight and therefore the natural tensions of her body weight will not add additional risk for recurrence.  She seems to be motivated by this and desires to defer elective repair at this time.  Past Medical History Past Medical History:  Diagnosis Date   Anxiety    Arthritis    Asthma    controlled   CVA (cerebral vascular accident) (HCC)    a. 09/2016   Depression    Dysrhythmia    Headache    Hypertension    Hypothyroidism    Morbid obesity (HCC)    PAF (paroxysmal atrial fibrillation) (HCC) 09/2016   a. 30-day event monitor 6/18: predominant rhythm of sinus with isolated PACs and PVCs.  Two narrow complex tachycardia episodes concerning for A. Fib +/-atrial flutter or SVT.  No prolonged pauses; b. CHADS2VASc = 3 (stroke x 2, female)--> Eliquis.   PFO (patent foramen ovale)    a. TTE 6/18: EF of 60 to 65%, normal wall motion, grade 1 diastolic dysfunction; b. TEE 6/18: evidence of a very small atrial level right to left shunt by bubble study without visualization of ASD or PFO   Substance abuse (HCC)    Past use of cocaine    Tobacco  abuse       Past Surgical History:  Procedure Laterality Date   FRACTURE SURGERY     TEE WITHOUT CARDIOVERSION N/A 09/08/2016   Procedure: TRANSESOPHAGEAL ECHOCARDIOGRAM (TEE);  Surgeon: [name], MD;  Location: [facility] ORS;  Service: Cardiovascular;  Laterality: N/A;   XI ROBOTIC ASSISTED VENTRAL HERNIA N/A 09/13/2019   Procedure: XI ROBOTIC Cholecystectomy, with primary non-mesh repair of EPIGATRIC HERNIA;  Surgeon: Campbell Lerner, MD;  Location: [facility] ORS;  Service: General;  Laterality: N/A;    No Known Allergies  Current Outpatient Medications  Medication Sig Dispense Refill   apixaban (ELIQUIS) 5 MG TABS tablet Take 1 tablet (5 mg total) by mouth 2 (two) times daily. 180 tablet 1   atorvastatin (LIPITOR) 80 MG tablet Take 1 tablet (80 mg total) by mouth daily. 90 tablet 3   levothyroxine (SYNTHROID) 125 MCG tablet Take 1 tablet (125 mcg total) by mouth daily before breakfast. FURTHER REFILLS WILL NEED TO BE SENT BY PCP 30 tablet 1   metoprolol tartrate (LOPRESSOR) 100 MG tablet Take 1 tablet 2 hours before CT 1 tablet 0   metoprolol tartrate (LOPRESSOR) 25 MG tablet Take 1 tablet (25 mg total) by mouth 2 (two) times daily. Please keep your upcoming appointment for 90 day refills. 180 tablet 3   No current facility-administered medications for this visit.  Family History Family History  Problem Relation Age of Onset   Stroke Father    Hypertension Daughter    Migraines Daughter    Cervical cancer Maternal Grandmother    Heart disease Maternal Grandfather    Stroke Maternal Grandfather       Social History Social History   Tobacco Use   Smoking status: Every Day    Packs/day: 10    Types: Cigarettes   Smokeless tobacco: Former    Types: Snuff   Tobacco comments:    trying to quit; going to start using patches;   Vaping Use   Vaping Use: Never used  Substance Use Topics   Alcohol use: Not Currently    Alcohol/week: 0.0 standard drinks of alcohol     Comment: on occasion   Drug use: Not Currently    Types: Marijuana    Comment: Previously, but stopped years ago        Review of Systems  All other systems reviewed and are negative.    Physical Exam Last menstrual period 05/07/2016.    CONSTITUTIONAL: Well developed, morbidly obese and well nourished, appropriately responsive and aware without distress.   EYES: Sclera non-icteric.   EARS, NOSE, MOUTH AND THROAT: Mask worn.   Hearing is intact to voice.  NECK: Trachea is midline, and there is no jugular venous distension.  LYMPH NODES:  Lymph nodes in the neck are not enlarged. RESPIRATORY:  Lungs are clear, and breath sounds are equal bilaterally. Normal respiratory effort without pathologic use of accessory muscles. CARDIOVASCULAR: Heart is regular in rate and rhythm. GI: The abdomen is obese with supraumbilical mass that is mildly tender with radiation to the left and right from this area.  Otherwise soft, nontender, and nondistended. There were no other palpable masses. I did not appreciate hepatosplenomegaly. There were normal bowel sounds. MUSCULOSKELETAL:  Symmetrical muscle tone appreciated in all four extremities.    SKIN: Skin turgor is normal. No pathologic skin lesions appreciated.  NEUROLOGIC:  Motor and sensation appear grossly normal.  Cranial nerves are grossly without defect. PSYCH:  Alert and oriented to person, place and time. Affect is appropriate for situation.  Data Reviewed I have personally reviewed what is currently available of the patient's imaging, recent labs and medical records.   Labs:     Latest Ref Rng & Units 11/20/2021    5:09 PM 09/08/2021    3:53 PM 05/07/2021    3:22 PM  CBC  WBC 4.0 - 10.5 K/uL 9.9  12.1  9.3   Hemoglobin 12.0 - 15.0 g/dL 69.6  29.5  28.4   Hematocrit 36.0 - 46.0 % 38.6  36.6  36.9   Platelets 150 - 400 K/uL 386  353  369       Latest Ref Rng & Units 07/08/2022    2:20 PM 11/20/2021    5:09 PM 09/08/2021    3:53 PM  CMP   Glucose 70 - 99 mg/dL 94  93  132   BUN 6 - 24 mg/dL 5  9  5    Creatinine 0.57 - 1.00 mg/dL 4.40  1.02  7.25   Sodium 134 - 144 mmol/L 144  142  139   Potassium 3.5 - 5.2 mmol/L 4.1  3.3  3.2   Chloride 96 - 106 mmol/L 105  107  107   CO2 20 - 29 mmol/L 25  28  25    Calcium 8.7 - 10.2 mg/dL 9.5  9.3  9.1   Total Protein  6.5 - 8.1 g/dL   7.2   Total Bilirubin 0.3 - 1.2 mg/dL   0.9   Alkaline Phos 38 - 126 U/L   95   AST 15 - 41 U/L   22   ALT 0 - 44 U/L   16       Imaging: Radiology review: Images personally reviewed.  I concur there is no evidence of bowel involvement in the incarcerated hernia which is primarily fat/likely omental.   CLINICAL DATA:  Abdominal pain common nausea and vomiting since last night   EXAM: CT ABDOMEN AND PELVIS WITH CONTRAST   TECHNIQUE: Multidetector CT imaging of the abdomen and pelvis was performed using the standard protocol following bolus administration of intravenous contrast.   RADIATION DOSE REDUCTION: This exam was performed according to the departmental dose-optimization program which includes automated exposure control, adjustment of the mA and/or kV according to patient size and/or use of iterative reconstruction technique.   CONTRAST:  OMNIPAQUE IOHEXOL 300 MG/ML  SOLN   COMPARISON:  CT July 17, 2020   FINDINGS: Lower chest: Hypoventilatory change in the lung bases.   Hepatobiliary: No suspicious hepatic lesion. Gallbladder surgically absent. Mild prominence of the biliary tree is similar prior and favored reservoir effect post cholecystectomy.   Pancreas: Pancreatic ductal dilation or evidence of acute inflammation. Probable pancreatic divisum, anatomic variant.   Spleen: No splenomegaly or focal splenic lesion.   Adrenals/Urinary Tract: Similar lobular thickening of the left adrenal gland without nodularity by size criteria, commonly reflecting tiny benign adrenal adenomas and requiring no  individual follow-up. Right adrenal gland appears normal. No hydronephrosis. Kidneys demonstrate symmetric enhancement.   Stomach/Bowel: No radiopaque enteric contrast material. Stomach is unremarkable for degree of distension. No pathologic dilation of small or large bowel. The appendix and terminal ileum appear normal. Evidence of acute bowel inflammation.   Vascular/Lymphatic: Normal caliber abdominal aorta. No pathologically enlarged abdominal or pelvic lymph nodes.   Reproductive: Uterus and bilateral adnexa are unremarkable.   Other: Trace pelvic free fluid is within physiologic normal limits. Probable inflammatory fluid within a fat containing supraumbilical ventral hernia with a 2.3 cm aperture width   Musculoskeletal: No acute osseous abnormality.   IMPRESSION: 1. Probable inflammatory fluid within a moderate-sized fat containing supraumbilical ventral hernia with a 2.3 cm aperture width, recommend clinical correlation for reducibility. 2. No evidence of bowel obstruction or acute bowel inflammation.     Electronically Signed   By: Maudry Mayhew M.D.   On: 09/08/2021 18:35   Assessment    Incarcerated, recurrent ventral hernia.  No evidence of bowel impairment on CT scan examination or clinical. Patient Active Problem List   Diagnosis Date Noted   Aortic atherosclerosis (HCC) 09/03/2021   Adjustment disorder with mixed anxiety and depressed mood 08/05/2021   Insomnia, unspecified 08/05/2021   Right sided weakness 10/24/2020   Essential hypertension 10/24/2020   Constipation 06/27/2020   Fatigue 04/05/2020   Encounter to establish care with new doctor 01/22/2020   Encounter for hepatitis C screening test for low risk patient 01/22/2020   Screening examination for sexually transmitted disease 01/22/2020   Skin tag 01/22/2020   Nonintractable headache 01/22/2020   Status post laparoscopic cholecystectomy 09/28/2019   Dyspnea on exertion 08/15/2018    Multinodular goiter 04/28/2017   Paroxysmal atrial fibrillation (HCC) 10/29/2016   PFO (patent foramen ovale) 09/23/2016   Hyperlipidemia 09/17/2016   IFG (impaired fasting glucose) 09/17/2016   History of stroke 09/06/2016   Myopia 12/20/2014  Abdominal pain 12/20/2014   Depression 12/20/2014   Right arm pain 11/21/2014   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  We discussed robotic recurrent ventral hernia repair with mesh.  We discussed mesh in detail and the need to utilize it in lieu of the fact that this fascial defect had been previously primarily repaired.  We discussed weight loss and the opportunity to defer elective repair. I discussed possibility of incarceration, strangulation, enlargement in size over time, and the need for emergency surgery in the face of these.  Also reviewed the techniques of reduction should incarceration occur, and when unsuccessful to present to the ED.  Also discussed that surgery risks include recurrence which can be up to 30% in the case of complex hernias, use of prosthetic materials (mesh) and the increased risk of infection and the possible need for re-operation and removal of mesh, possibility of post-op SBO or ileus, and the risks of general anesthetic including heart attack, stroke, sudden death or some reaction to anesthetic medications. The patient, and those present, appear to understand the risks, any and all questions were answered to the patient's satisfaction.  No guarantees were ever expressed or implied. Face-to-face time spent with the patient and accompanying care providers(if present) was 40 minutes, with more than 50% of the time spent counseling, educating, and coordinating care of the patient.    She continues to  smoke and I have asked her also that smoking cessation will enable her to have the best both postoperative outcome as feasible.      Campbell Lerner M.D., FACS 09/30/2022, 9:07 AM

## 2022-10-01 ENCOUNTER — Ambulatory Visit: Payer: Medicaid Other | Admitting: Surgery

## 2022-10-05 ENCOUNTER — Encounter: Payer: MEDICAID | Admitting: Podiatry

## 2022-10-06 NOTE — Progress Notes (Signed)
This encounter was created in error - please disregard.

## 2022-10-13 ENCOUNTER — Ambulatory Visit: Payer: MEDICAID | Admitting: Surgery

## 2022-11-08 NOTE — Progress Notes (Deleted)
Office Visit    Patient Name: Margaret [name] Date of Encounter: 11/08/2022  Primary Care Provider:  [facility] Primary Cardiologist:  [name], MD Primary Electrophysiologist: None   Past Medical History    Past Medical History:  Diagnosis Date   Anxiety    Arthritis    Asthma    controlled   CVA (cerebral vascular accident) (HCC)    a. 09/2016   Depression    Dysrhythmia    Headache    Hypertension    Hypothyroidism    Morbid obesity (HCC)    PAF (paroxysmal atrial fibrillation) (HCC) 09/2016   a. 30-day event monitor 6/18: predominant rhythm of sinus with isolated PACs and PVCs.  Two narrow complex tachycardia episodes concerning for A. Fib +/-atrial flutter or SVT.  No prolonged pauses; b. CHADS2VASc = 3 (stroke x 2, female)--> Eliquis.   PFO (patent foramen ovale)    a. TTE 6/18: EF of 60 to 65%, normal wall motion, grade 1 diastolic dysfunction; b. TEE 6/18: evidence of a very small atrial level right to left shunt by bubble study without visualization of ASD or PFO   Substance abuse (HCC)    Past use of cocaine    Tobacco abuse    Past Surgical History:  Procedure Laterality Date   FRACTURE SURGERY     TEE WITHOUT CARDIOVERSION N/A 09/08/2016   Procedure: TRANSESOPHAGEAL ECHOCARDIOGRAM (TEE);  Surgeon: [name], MD;  Location: [facility] ORS;  Service: Cardiovascular;  Laterality: N/A;   XI ROBOTIC ASSISTED VENTRAL HERNIA N/A 09/13/2019   Procedure: XI ROBOTIC Cholecystectomy, with primary non-mesh repair of EPIGATRIC HERNIA;  Surgeon: Campbell Lerner, MD;  Location: [facility] ORS;  Service: General;  Laterality: N/A;    Allergies  No Known Allergies   History of Present Illness    Margaret [name]  is a 52 year old female with a PMH of  PAF complicated by CVA, patent foramen ovale, COVID in 05/2019, 05/2020 and 03/2021, HTN, HLD, hypothyroidism, asthma, intractable headache, tobacco use, and remote substance abuse with marijuana and  cocaine who presents today for 73-month follow-up.   Margaret [name]  was initially seen in 2018 for right frontal CVA.  She had a 2D echo completed that showed EF of 60 to 65% with no RWMA and grade 1 DD.  Carotid ultrasounds were also completed showing less than 50% stenosis bilaterally.  TEE completed demonstrated small atrial level right to left shunting by bubble study W/O ASD or PFO.  He had lower extremity US performed that were negative for DVT bilaterally.  She completed an event monitor that showed predominantly sinus rhythm with 2 episodes of narrow complex tachycardia consistent with possible AF.  She was started on Xarelto and transition to Eliquis.  She was transition from Dr. Okey Dupre to Dr. Anne Fu when she moved to [facility] in 2023.  She was seen most recently on 07/08/2022 by Bernadene Person, NP and reported being stable overall from a cardiac standpoint.  She endorsed decreased energy and intermittent pain in her right arm and chest.  She underwent coronary CTA that showed minimal nonobstructive CAD with calcium score of 0.   Since last being seen in the office patient reports***.  Patient denies chest pain, palpitations, dyspnea, PND, orthopnea, nausea, vomiting, dizziness, syncope, edema, weight gain, or early satiety.         ***Notes:  Home Medications    Current Outpatient Medications  Medication Sig Dispense Refill   apixaban (ELIQUIS)  5 MG TABS tablet Take 1 tablet (5 mg total) by mouth 2 (two) times daily. 180 tablet 1   atorvastatin (LIPITOR) 80 MG tablet Take 1 tablet (80 mg total) by mouth daily. 90 tablet 3   levothyroxine (SYNTHROID) 125 MCG tablet Take 1 tablet (125 mcg total) by mouth daily before breakfast. FURTHER REFILLS WILL NEED TO BE SENT BY PCP 30 tablet 1   metoprolol tartrate (LOPRESSOR) 100 MG tablet Take 1 tablet 2 hours before CT 1 tablet 0   metoprolol tartrate (LOPRESSOR) 25 MG tablet Take 1 tablet (25 mg total) by mouth 2 (two) times daily. Please keep your upcoming  appointment for 90 day refills. 180 tablet 3   No current facility-administered medications for this visit.     Review of Systems  Please see the history of present illness.    (+)*** (+)***  All other systems reviewed and are otherwise negative except as noted above.  Physical Exam    Wt Readings from Last 3 Encounters:  07/08/22 233 lb 6.4 oz (105.9 kg)  05/05/22 242 lb (109.8 kg)  11/20/21 235 lb (106.6 kg)   [id number] were no vitals filed for this visit.,There is no height or weight on file to calculate BMI.  Constitutional:      Appearance: Healthy appearance. Not in distress.  Neck:     Vascular: JVD normal.  Pulmonary:     Effort: Pulmonary effort is normal.     Breath sounds: No wheezing. No rales. Diminished in the bases Cardiovascular:     Normal rate. Regular rhythm. Normal S1. Normal S2.      Murmurs: There is no murmur.  Edema:    Peripheral edema absent.  Abdominal:     Palpations: Abdomen is soft non tender. There is no hepatomegaly.  Skin:    General: Skin is warm and dry.  Neurological:     General: No focal deficit present.     Mental Status: Alert and oriented to person, place and time.     Cranial Nerves: Cranial nerves are intact.  EKG/LABS/ Recent Cardiac Studies    ECG personally reviewed by me today - ***   Risk Assessment/Calculations:   {Does this patient have ATRIAL FIBRILLATION?:[phone]}        Lab Results  Component Value Date   WBC 9.9 11/20/2021   HGB 12.2 11/20/2021   HCT 38.6 11/20/2021   MCV 83.4 11/20/2021   PLT 386 11/20/2021   Lab Results  Component Value Date   CREATININE 0.72 07/08/2022   BUN 5 (L) 07/08/2022   NA 144 07/08/2022   K 4.1 07/08/2022   CL 105 07/08/2022   CO2 25 07/08/2022   Lab Results  Component Value Date   ALT 16 09/08/2021   AST 22 09/08/2021   ALKPHOS 95 09/08/2021   BILITOT 0.9 09/08/2021   Lab Results  Component Value Date   CHOL 142 05/07/2021   HDL 35 (L) 05/07/2021    LDLCALC 80 05/07/2021   LDLDIRECT 80 05/07/2021   TRIG 157 (H) 05/07/2021   CHOLHDL 4.1 05/07/2021    Lab Results  Component Value Date   HGBA1C 6.1 (H) 01/22/2020     Assessment & Plan    1.  Nonobstructive CAD: -Patient underwent recent coronary CTA that showed calcium score of 0 and mild nonobstructive disease. -Today patient reports***   2.  Chest pain: -Today patient reports***   3.  Paroxysmal atrial fibrillation: -Patient currently on rate control -Patient's last creatinine was***and hemoglobin  was*** -Continue Eliquis 5 mg twice daily   4.  Essential hypertension: -Patient's blood pressure today was*** -Continue***   5.  History of CVA: -Continue GDMT with***   6.  History of PFO: -Seen on TEE following CVA 2018 -Patient continued on indefinite AC      Disposition: Follow-up with [name], MD or APP in *** months {Are you ordering a CV Procedure (e.g. stress test, cath, DCCV, TEE, etc)?   Press F2        :[id number]}   Medication Adjustments/Labs and Tests Ordered: Current medicines are reviewed at length with the patient today.  Concerns regarding medicines are outlined above.   Signed, Napoleon Form, Leodis Rains, NP 11/08/2022, 4:36 PM [facility]

## 2022-11-10 ENCOUNTER — Ambulatory Visit: Payer: MEDICAID | Attending: Nurse Practitioner | Admitting: Nurse Practitioner

## 2022-11-10 DIAGNOSIS — Q2112 Patent foramen ovale: Secondary | ICD-10-CM

## 2022-11-10 DIAGNOSIS — I10 Essential (primary) hypertension: Secondary | ICD-10-CM

## 2022-11-10 DIAGNOSIS — R079 Chest pain, unspecified: Secondary | ICD-10-CM

## 2022-11-10 DIAGNOSIS — Z8673 Personal history of transient ischemic attack (TIA), and cerebral infarction without residual deficits: Secondary | ICD-10-CM

## 2022-11-10 DIAGNOSIS — I2510 Atherosclerotic heart disease of native coronary artery without angina pectoris: Secondary | ICD-10-CM

## 2022-11-10 DIAGNOSIS — I480 Paroxysmal atrial fibrillation: Secondary | ICD-10-CM

## 2022-11-18 ENCOUNTER — Ambulatory Visit: Payer: MEDICAID | Admitting: Podiatry

## 2022-11-25 ENCOUNTER — Ambulatory Visit: Payer: MEDICAID | Admitting: Podiatry

## 2022-11-30 ENCOUNTER — Ambulatory Visit: Payer: MEDICAID | Admitting: Podiatry

## 2022-12-17 ENCOUNTER — Encounter (HOSPITAL_BASED_OUTPATIENT_CLINIC_OR_DEPARTMENT_OTHER): Payer: Self-pay | Admitting: Family

## 2022-12-17 ENCOUNTER — Ambulatory Visit (HOSPITAL_BASED_OUTPATIENT_CLINIC_OR_DEPARTMENT_OTHER): Payer: MEDICAID | Admitting: Family

## 2022-12-17 ENCOUNTER — Encounter (HOSPITAL_BASED_OUTPATIENT_CLINIC_OR_DEPARTMENT_OTHER): Payer: Self-pay

## 2022-12-17 VITALS — BP 122/88 | HR 84 | Ht 65.0 in | Wt 236.0 lb

## 2022-12-17 DIAGNOSIS — Z79899 Other long term (current) drug therapy: Secondary | ICD-10-CM

## 2022-12-17 DIAGNOSIS — Q2112 Patent foramen ovale: Secondary | ICD-10-CM

## 2022-12-17 DIAGNOSIS — I2510 Atherosclerotic heart disease of native coronary artery without angina pectoris: Secondary | ICD-10-CM

## 2022-12-17 DIAGNOSIS — E785 Hyperlipidemia, unspecified: Secondary | ICD-10-CM

## 2022-12-17 DIAGNOSIS — I480 Paroxysmal atrial fibrillation: Secondary | ICD-10-CM

## 2022-12-17 DIAGNOSIS — Z8673 Personal history of transient ischemic attack (TIA), and cerebral infarction without residual deficits: Secondary | ICD-10-CM | POA: Diagnosis not present

## 2022-12-17 DIAGNOSIS — I700 Atherosclerosis of aorta: Secondary | ICD-10-CM

## 2022-12-17 MED ORDER — APIXABAN 5 MG PO TABS
5.0000 mg | ORAL_TABLET | Freq: Two times a day (BID) | ORAL | 1 refills | Status: DC
Start: 2022-12-17 — End: 2023-08-05

## 2022-12-17 MED ORDER — ATORVASTATIN CALCIUM 40 MG PO TABS
40.0000 mg | ORAL_TABLET | Freq: Every day | ORAL | 3 refills | Status: DC
Start: 2022-12-17 — End: 2023-08-05

## 2022-12-17 NOTE — Progress Notes (Signed)
Cardiology Office Note:  .   Date:  12/17/2022  ID:  Margaret [name], DOB 31-Aug-1970, MRN [id number] PCP: [facility] Providers Cardiologist:  [name], MD    History of Present Illness: .   Margaret [name] is a 52 y.o. female with a hx of  PAF on Eliquis, PFO, hypothyroidism, asthma, remote history of polysubstance abuse (cocaine, marijuana), tobacco abuse, COVID 19 (05/2019), nonobstructive coronary artery disease.    She was hospitalized 09/2016 due to slurred speech and right frontal lobe stroke. Echo with normal LVEF, gr1DD. Carotid duplex <50% bilateral internal carotid artery stenoses. TEE with small atrial level right to left shunt consistent with PFO. Followed by 30 day monitor with 2 episodes of narrow complex tachycardia concerning for atrial flutter/fibrillation. She was placed on Xarelto and beta blocker therapy and later switched from Xarelto to Eliquis.   ED visit 08/22/2019 for neck pain and intermittent paresthesias to the left arm with CT head unremarkable and pain reproduced on palpation atypical for angina.  In 07/07/2022 subsequent cardiac CTA coronary calcium score of 0 that did note total plaque volume of 32mm3 placing her in the 63rd percentile for age/sex matched controlled with overall minimal nonobstructive disease 0 to 24% stenosis in the mid LAD and ramus branch.   She requested a appointment due to sharp pain in her right arm radiating to her back. Presents today for follow up with her daughter. Notes right arm pain that radiates down her back for the last 1-2 week. She has been taking Aspirin 81mg  as needed with some improvement in the pain. Describes as a sharp pain. It is worse with movement. She does not have a primary care doctor. She has not taken her medications in one month as she did not feel she needed them. We reviewed each of her medication indications.   ROS: Please see the history of present illness.    All  other systems reviewed and are negative.   Studies Reviewed: .        Cardiac Studies & Procedures       ECHOCARDIOGRAM  ECHOCARDIOGRAM COMPLETE 09/07/2016  Narrative *[facility]* [address] [place], [place] [address] [phone]  ------------------------------------------------------------------- Transthoracic Echocardiography  Patient:    Margaret [name] MR #:       [id number] Study Date: 09/07/2016 Gender:     F Age:        45 Height:     167.6 cm Weight:     97.1 kg BSA:        2.16 m^2 Pt. Status: Room:  ATTENDING    Oretha Caprice REFERRING    Barney Drain SONOGRAPHER  Dorene Sorrow Hege RDCS PERFORMING   [facility]  cc:  ------------------------------------------------------------------- LV EF: 60% -   65%  ------------------------------------------------------------------- Indications:      Stroke 434.91.  ------------------------------------------------------------------- History:   PMH:  Asthma, thyroid disease.  ------------------------------------------------------------------- Study Conclusions  - Left ventricle: The cavity size was normal. Wall thickness was normal. Systolic function was normal. The estimated ejection fraction was in the range of 60% to 65%. Wall motion was normal; there were no regional wall motion abnormalities. Doppler parameters are consistent with abnormal left ventricular relaxation (grade 1 diastolic dysfunction).  Impressions:  - No cardiac source of emboli was indentified.  ------------------------------------------------------------------- Study data:   Study status:  Routine.  Procedure:  The patient reported no pain  pre or post test. Transthoracic echocardiography. Image quality was adequate.          Transthoracic echocardiography.  M-mode, complete 2D, spectral Doppler, and color Doppler.  Birthdate:  Patient birthdate: 1970/07/07.  Age:  Patient is  52 yr old.  Sex:  Gender: female.    BMI: 34.6 kg/m^2.  Blood pressure:     127/81  Patient status:  Inpatient.  Study date: Study date: 09/07/2016. Study time: 10:52 AM.  Location:  Bedside.  -------------------------------------------------------------------  ------------------------------------------------------------------- Left ventricle:  The cavity size was normal. Wall thickness was normal. Systolic function was normal. The estimated ejection fraction was in the range of 60% to 65%. Wall motion was normal; there were no regional wall motion abnormalities. Doppler parameters are consistent with abnormal left ventricular relaxation (grade 1 diastolic dysfunction).  ------------------------------------------------------------------- Aortic valve:   Trileaflet; normal thickness leaflets. Mobility was not restricted.  Doppler:  Transvalvular velocity was within the normal range. There was no stenosis. There was no regurgitation. Peak velocity ratio of LVOT to aortic valve: 0.73. Valve area (Vmax): 2.28 cm^2. Indexed valve area (Vmax): 1.05 cm^2/m^2. Peak gradient (S): 7 mm Hg.  ------------------------------------------------------------------- Aorta:  Aortic root: The aortic root was normal in size.  ------------------------------------------------------------------- Mitral valve:   Structurally normal valve.   Mobility was not restricted.  Doppler:  Transvalvular velocity was within the normal range. There was no evidence for stenosis. There was trivial regurgitation.    Peak gradient (D): 3 mm Hg.  ------------------------------------------------------------------- Left atrium:  The atrium was normal in size.  ------------------------------------------------------------------- Right ventricle:  The cavity size was normal. Wall thickness was normal. Systolic function was normal.  ------------------------------------------------------------------- Pulmonic valve:     Doppler:  Transvalvular velocity was within the normal range. There was no evidence for stenosis.  ------------------------------------------------------------------- Tricuspid valve:   Structurally normal valve.    Doppler: Transvalvular velocity was within the normal range. There was trivial regurgitation.  ------------------------------------------------------------------- Pulmonary artery:   The main pulmonary artery was normal-sized. Systolic pressure was within the normal range.  ------------------------------------------------------------------- Right atrium:  The atrium was normal in size.  ------------------------------------------------------------------- Pericardium:  There was no pericardial effusion.  ------------------------------------------------------------------- Systemic veins: Inferior vena cava: The vessel was normal in size.  ------------------------------------------------------------------- Measurements  Left ventricle                           Value          Reference LV ID, ED, PLAX chordal          (L)     38.3  mm       43 - 52 LV ID, ES, PLAX chordal          (L)     21.5  mm       23 - 38 LV fx shortening, PLAX chordal           44    %        >=29 LV PW thickness, ED                      8.96  mm       ---------- IVS/LV PW ratio, ED                      1.21           <=1.3 LV e&', lateral  9.57  cm/s     ---------- LV E/e&', lateral                         8.77           ---------- LV e&', medial                            7.51  cm/s     ---------- LV E/e&', medial                          11.17          ---------- LV e&', average                           8.54  cm/s     ---------- LV E/e&', average                         9.82           ----------  Ventricular septum                       Value          Reference IVS thickness, ED                        10.8  mm       ----------  LVOT                                      Value          Reference LVOT ID, S                               20    mm       ---------- LVOT area                                3.14  cm^2     ---------- LVOT peak velocity, S                    97.5  cm/s     ----------  Aortic valve                             Value          Reference Aortic valve peak velocity, S            134   cm/s     ---------- Aortic peak gradient, S                  7     mm Hg    ---------- Velocity ratio, peak, LVOT/AV            0.73           ---------- Aortic valve area, peak velocity         2.28  cm^2     ---------- Aortic valve area/bsa, peak  1.05  cm^2/m^2 ---------- velocity  Aorta                                    Value          Reference Aortic root ID, ED                       29    mm       ----------  Left atrium                              Value          Reference LA ID, A-P, ES                           25    mm       ---------- LA ID/bsa, A-P                           1.16  cm/m^2   <=2.2 LA volume, S                             46.1  ml       ---------- LA volume/bsa, S                         21.3  ml/m^2   ---------- LA volume, ES, 1-p A4C                   40    ml       ---------- LA volume/bsa, ES, 1-p A4C               18.5  ml/m^2   ---------- LA volume, ES, 1-p A2C                   54.7  ml       ---------- LA volume/bsa, ES, 1-p A2C               25.3  ml/m^2   ----------  Mitral valve                             Value          Reference Mitral E-wave peak velocity              83.9  cm/s     ---------- Mitral A-wave peak velocity              99.9  cm/s     ---------- Mitral deceleration time                 211   ms       150 - 230 Mitral peak gradient, D                  3     mm Hg    ---------- Mitral E/A ratio, peak                   0.8            ----------  Right atrium  Value          Reference RA ID, S-I, ES, A4C                      44.4  mm       34 - 49 RA area, ES,  A4C                         11.6  cm^2     8.3 - 19.5 RA volume, ES, A/L                       24.6  ml       ---------- RA volume/bsa, ES, A/L                   11.4  ml/m^2   ----------  Right ventricle                          Value          Reference RV ID, ED, PLAX                          30.6  mm       19 - 38 TAPSE                                    25.2  mm       ---------- RV s&', lateral, S                        15.7  cm/s     ----------  Pulmonic valve                           Value          Reference Pulmonic valve peak velocity, S          83.4  cm/s     ----------  Legend: (L)  and  (H)  mark values outside specified reference range.  ------------------------------------------------------------------- Prepared and Electronically Authenticated by  Lorine Bears, MD 2018-06-04T13:39:31   TEE  ECHO TEE 09/08/2016  Narrative *[facility]* [address] [place], [place] [address] [phone]  ------------------------------------------------------------------- Transesophageal Echocardiography  Patient:    Mardy, Ollivierre MR #:       [id number] Study Date: 09/08/2016 Gender:     F Age:        45 Height:     167.6 cm Weight:     97.1 kg BSA:        2.16 m^2 Pt. Status: Room:       107A  Beverely Low REFERRING    Dunn, Ryan M ATTENDING    Gouru, Aruna ADMITTING    Altamese Dilling Md SONOGRAPHER  Cristela Blue RDCS PERFORMING   [facility]  cc:  -------------------------------------------------------------------  ------------------------------------------------------------------- Indications:      Stroke 434.91.  ------------------------------------------------------------------- Study Conclusions  - Left ventricle: The cavity size was normal. Wall thickness was normal. Systolic function was normal. - Aortic valve: No evidence of vegetation. There was trivial regurgitation. - Mitral valve: No evidence  of vegetation. There was mild regurgitation. - Left atrium: No evidence of thrombus in  the atrial cavity or appendage. - Right atrium: No evidence of thrombus in the atrial cavity or appendage. No evidence of thrombus in the appendage. - Atrial septum: Agitated saline contrast study showed a very small right-to-left shunt, at baseline or with provocation. Though no PFO or ASD is clear identified, a tiny shunt cannot be excluded based on early appearance of a small number of bubbles in the left ventricle. - Tricuspid valve: No evidence of vegetation. - Pulmonic valve: No evidence of vegetation.  Impressions:  - Evidence of very small atrial level right-to-left shunt by bubble study without visualization of ASD or PFO.  ------------------------------------------------------------------- Study data:   Procedure:  Initial setup. The patient was brought to the laboratory. Surface ECG leads were monitored. Sedation. Conscious sedation was administered. Transesophageal echocardiography. Topical anesthesia was obtained using viscous lidocaine. A transesophageal probe was inserted by the attending cardiologist. Image quality was adequate.  Study completion:  The patient tolerated the procedure well. There were no complications. Diagnostic transesophageal echocardiography.  2D and color Doppler.  Birthdate:  Patient birthdate: [birth year].  Age:  Patient is 52 yr old.  Sex:  Gender: female.    BMI: 34.6 kg/m^2.  Study date:  Study date: 09/08/2016. Study time: 10:02 AM.  -------------------------------------------------------------------  ------------------------------------------------------------------- Left ventricle:  The cavity size was normal. Wall thickness was normal. Systolic function was normal.  ------------------------------------------------------------------- Aortic valve:   Structurally normal valve.   Cusp separation was normal.  No evidence of vegetation.  Doppler:   There was trivial regurgitation.  ------------------------------------------------------------------- Aorta:  The aorta was normal, not dilated, and non-diseased.  ------------------------------------------------------------------- Mitral valve:   Structurally normal valve.   Leaflet separation was normal.  No evidence of vegetation.  Doppler:  There was mild regurgitation.  ------------------------------------------------------------------- Left atrium:  The atrium was normal in size.  No evidence of thrombus in the atrial cavity or appendage.  ------------------------------------------------------------------- Atrial septum:   Agitated saline contrast study showed a very small right-to-left shunt, at baseline or with provocation.  ------------------------------------------------------------------- Right ventricle:  The cavity size was normal. Systolic function was normal.  ------------------------------------------------------------------- Pulmonic valve:    Structurally normal valve.   Cusp separation was normal.  No evidence of vegetation.  Doppler:  There was mild regurgitation.  ------------------------------------------------------------------- Tricuspid valve:   Structurally normal valve.   Leaflet separation was normal.  No evidence of vegetation.  Doppler:  There was mild regurgitation.  ------------------------------------------------------------------- Right atrium:  The atrium was normal in size.  No evidence of thrombus in the atrial cavity or appendage.  No evidence of thrombus in the appendage. The appendage was normal sized.  ------------------------------------------------------------------- Post procedure conclusions Ascending Aorta:  - The aorta was normal, not dilated, and non-diseased.  ------------------------------------------------------------------- Measurements  Aorta                  Value Aortic root ID, ED     32    mm  Legend: (L)  and   (H)  mark values outside specified reference range.  ------------------------------------------------------------------- Prepared and Electronically Authenticated by  [name], MD 2018-06-05T13:57:37   MONITORS  CARDIAC EVENT MONITOR 09/16/2016  Narrative  The patient was enrolled for 30 days.  The predominant rhythm was sinus.  Isolated PACs and PVCs were identified.  2 episodes of narrow complex tachycardia were identified on 10/02/16 with heart rates up to 230 bpm. While some portions appear regular, there are other periods of irregularity without clear P waves consistent with atrial fibrillation +/- atrial flutter or SVT.  No prolonged pauses were identified.  Patient triggered events corresponded to narrow complex tachycardia consistent with atrial fibrillation +/- atrial flutter/SVT.  Predominantly sinus rhythm with paroxysmal atrial fibrillation +/- atrial flutter/SVT.  Narrow complex tachycardia was discussed with the patient and treatment for paroxysmal atrial fibrillation initiated while the patient was still being monitored.   CT SCANS  CT CORONARY MORPH W/CTA COR W/SCORE 07/24/2022  Addendum 07/26/2022  7:26 PM ADDENDUM REPORT: 07/26/2022 19:23  EXAM: OVER-READ INTERPRETATION  CT CHEST  The following report is an over-read performed by radiologist Dr. Curly Shores [facility] on 07/26/2022. This over-read does not include interpretation of cardiac or coronary anatomy or pathology. The coronary CTA interpretation by the cardiologist is attached.  COMPARISON:  None.  FINDINGS: Cardiovascular: Findings discussed in the body of the report. No incidental pulmonary artery filling defects identified to suggest PE  Mediastinum/Nodes: No suspicious adenopathy identified. Imaged mediastinal structures are unremarkable.  Lungs/Pleura: Dependent basilar subsegmental atelectasis. Lungs are otherwise clear. No pleural effusion or  pneumothorax.  Upper Abdomen: No acute abnormality.  Musculoskeletal: No chest wall abnormality. No acute or significant osseous findings.  IMPRESSION: No significant extracardiac incidental findings identified.   Electronically Signed By: Layla Maw M.D. On: 07/26/2022 19:23  Narrative CLINICAL DATA:  74F with chest pain  EXAM: Cardiac/Coronary CTA  TECHNIQUE: The patient was scanned on a [employer].  FINDINGS: A 100 kV prospective scan was triggered in the descending thoracic aorta at 111 HU's. Axial non-contrast 3 mm slices were carried out through the heart. The data set was analyzed on a dedicated work station and scored using the Agatson method. Gantry rotation speed was 250 msecs and collimation was .6 mm. No beta blockade and 0.8 mg of sl NTG was given. The 3D data set was reconstructed in 5% intervals of the 35-75% of the R-R cycle. Phases were analyzed on a dedicated work station using MPR, MIP and VRT modes. The patient received 100 cc of contrast.  Coronary Arteries:  Normal coronary origin.  Right dominance.  RCA is a large dominant artery that gives rise to PDA and PLA. There is no plaque.  Left main is a large artery that gives rise to LAD and LCX arteries.  LAD is a large vessel. Noncalcified plaque in mid LAD causes minimal (0-24%) stenosis  LCX is a small, non-dominant artery.  There is no plaque.  Noncalcified plaque in ramus branch causes 0-24% stenosis  Other findings:  Left Ventricle: Normal size  Left Atrium: Mild enlargement  Pulmonary Veins: Normal configuration  Right Ventricle: Normal size  Right Atrium: Normal size  Cardiac valves: No calcifications  Thoracic aorta: Normal size  Pulmonary Arteries: Normal size  Systemic Veins: Normal drainage  Pericardium: Normal thickness  IMPRESSION: 1. Coronary calcium score of 0.  2. Total plaque volume 10mm3 which is 63rd percentile for age and sex-matched  controls (calcified plaque 61mm3; noncalcified plaque 67mm3). TPV is mild  3. Normal coronary origin with right dominance.  4. Nonobstructive CAD, with noncalcified plaque in mid LAD and ramus branch causing minimal (0-24%) stenosis  CAD-RADS 1. Minimal non-obstructive CAD (0-24%). Consider non-atherosclerotic causes of chest pain. Consider preventive therapy and risk factor modification.  Electronically Signed: By: Epifanio Lesches M.D. On: 07/26/2022 14:06          Risk Assessment/Calculations:    CHA2DS2-VASc Score = 5   This indicates a 7.2% annual risk of stroke. The patient's score is based upon: CHF History: 0 HTN History:  1 Diabetes History: 0 Stroke History: 2 Vascular Disease History: 1 Age Score: 0 Gender Score: 1            Physical Exam:   VS:  BP 122/88   Pulse 84   Ht 5\' 5"  (1.651 m)   Wt 236 lb (107 kg)   LMP 05/07/2016   BMI 39.27 kg/m    Wt Readings from Last 3 Encounters:  12/17/22 236 lb (107 kg)  07/08/22 233 lb 6.4 oz (105.9 kg)  05/05/22 242 lb (109.8 kg)    GEN: Well nourished, well developed in no acute distress NECK: No JVD; No carotid bruits CARDIAC: RRR, no murmurs, rubs, gallops RESPIRATORY:  Clear to auscultation without rales, wheezing or rhonchi  ABDOMEN: Soft, non-tender, non-distended EXTREMITIES:  No edema; No deformity   ASSESSMENT AND PLAN: .    Right shoulder pain - 2 week history of right shoulder pain radiating down her side. Reassurance provided non cardiac. Recommend ibuprofen 600mg  BID x 5 days and heat pack. Recommend follow up with PCP or establish with orthopedics walk in clinic.   Nonobstructive CAD / Aortic atherosclerosis / HLD, LDL goal <70 - CTA 07/2022 with minimal nonobstructive 0-24% stenosis of LAD and ramus branch. Present right shoulder pain atypical for angina. has not taken atorvastatin in 1 month. Reiterated need for lipid control. Will resume at Atorvastatin 40mg  daily with FLP/CMP in 3 months.    PAF/hypercoagulable state- NSR by auscultation. No recent palpitations. Requests simplification of medication regimen, will stop metoprolol as she also noted feeling lethargic on her medications. Continue Eliquis 5mg  BID, had not taken in 1 month and reiterated need to take regularly. CHA2DS2-VASc Score = 5 [CHF History: 0, HTN History: 1, Diabetes History: 0, Stroke History: 2, Vascular Disease History: 1, Age Score: 0, Gender Score: 1].  Therefore, the patient's annual risk of stroke is 7.2 %.       History of CVA- Continue Eliquis, Atorvastatin. She had not taken in 1 month and was strongly encouraged to resume. No ASA due to [facility].   PFO-small PFO on TEE at time of CVA 2018.  On lifelong OAC in setting of atrial fib, no intervention at this time.  Tobacco and marijuana use - Smoking cessation encouraged. Recommend utilization of 1800QUITNOW.   Hypothyroidism - Likely unmedicated for some time as last Rx I am able to view was sent 05/2021 for 60 day supply. Strongly encouraged to establish with PCP.   Morbid obesity - Weight loss via diet and exercise encouraged. Discussed the impact being overweight would have on cardiovascular risk.        Dispo: follow up in 3-4 months  Signed, Alver Sorrow, NP

## 2022-12-17 NOTE — Patient Instructions (Addendum)
Medication Instructions:  Your physician has recommended you make the following change in your medication:   Start: Ibuprofen 600mg  twice daily for 3-5 days   Resume: Atorvastatin 40mg  daily- this prevents you from having a stroke   Resume: Eliquis 5mg  twice daily- this prevents you from having a stroke    *If you need a refill on your cardiac medications before your next appointment, please call your pharmacy*   Lab Work: Your physician recommends that you return for lab work in 3 months for FLP, CMP   Please return for Lab work. You may come to the...   [facility] (3rd floor) [address] [place], [place] [address]  Open: 8am-Noon and 1pm-4:30pm  Please ring the doorbell on the small table when you exit the elevator and the Lab Tech will come get you  [facility] [address], [place], [place] [address] Open: 8am-1pm, then 2pm-4:30pm   [facility]- Please see attached locations sheet stapled to your lab work with address and hours.    If you have labs (blood work) drawn today and your tests are completely normal, you will receive your results only by: MyChart Message (if you have MyChart) OR A paper copy in the mail If you have any lab test that is abnormal or we need to change your treatment, we will call you to review the results.   Follow-Up: At [facility], you and your health needs are our priority.  As part of our continuing mission to provide you with exceptional heart care, we have created designated Provider Care Teams.  These Care Teams include your primary Cardiologist (physician) and Advanced Practice Providers (APPs -  Physician Assistants and Nurse Practitioners) who all work together to provide you with the care you need, when you need it.  We recommend signing up for the patient portal called "MyChart".  Sign up information is provided on this After Visit Summary.  MyChart is used to  connect with patients for Virtual Visits (Telemedicine).  Patients are able to view lab/test results, encounter notes, upcoming appointments, etc.  Non-urgent messages can be sent to your provider as well.   To learn more about what you can do with MyChart, go to [email or web address].    Your next appointment:   3-4 month(s)  Provider:   Donato Schultz, MD    Other Instructions  Please work to establish with a PCP   Please stop and see ortho

## 2022-12-28 ENCOUNTER — Encounter: Payer: Self-pay | Admitting: Podiatry

## 2022-12-28 ENCOUNTER — Ambulatory Visit (INDEPENDENT_AMBULATORY_CARE_PROVIDER_SITE_OTHER): Payer: MEDICAID | Admitting: Podiatry

## 2022-12-28 DIAGNOSIS — M79674 Pain in right toe(s): Secondary | ICD-10-CM

## 2022-12-28 DIAGNOSIS — B351 Tinea unguium: Secondary | ICD-10-CM

## 2022-12-28 DIAGNOSIS — M79675 Pain in left toe(s): Secondary | ICD-10-CM

## 2022-12-28 DIAGNOSIS — L600 Ingrowing nail: Secondary | ICD-10-CM

## 2022-12-28 NOTE — Progress Notes (Signed)
Chief Complaint  Patient presents with   Ingrown Toenail    Left great toenail ingrown medial border. Throbbing pain to the border. Denies any bleeding or pus at this time.     Subjective: Patient presents today for evaluation of pain to the medial border left great toe. Patient is concerned for possible ingrown nail.  It is very sensitive to touch.  Patient also requesting nail debridement today.  Her nails are tender especially in close toed shoes.  Patient presents today for further treatment and evaluation.  Past Medical History:  Diagnosis Date   Anxiety    Arthritis    Asthma    controlled   CVA (cerebral vascular accident) (HCC)    a. 09/2016   Depression    Dysrhythmia    Headache    Hypertension    Hypothyroidism    Morbid obesity (HCC)    PAF (paroxysmal atrial fibrillation) (HCC) 09/2016   a. 30-day event monitor 6/18: predominant rhythm of sinus with isolated PACs and PVCs.  Two narrow complex tachycardia episodes concerning for A. Fib +/-atrial flutter or SVT.  No prolonged pauses; b. CHADS2VASc = 3 (stroke x 2, female)--> Eliquis.   PFO (patent foramen ovale)    a. TTE 6/18: EF of 60 to 65%, normal wall motion, grade 1 diastolic dysfunction; b. TEE 6/18: evidence of a very small atrial level right to left shunt by bubble study without visualization of ASD or PFO   Substance abuse (HCC)    Past use of cocaine    Tobacco abuse     Past Surgical History:  Procedure Laterality Date   FRACTURE SURGERY     TEE WITHOUT CARDIOVERSION N/A 09/08/2016   Procedure: TRANSESOPHAGEAL ECHOCARDIOGRAM (TEE);  Surgeon: [name], MD;  Location: [facility] ORS;  Service: Cardiovascular;  Laterality: N/A;   XI ROBOTIC ASSISTED VENTRAL HERNIA N/A 09/13/2019   Procedure: XI ROBOTIC Cholecystectomy, with primary non-mesh repair of EPIGATRIC HERNIA;  Surgeon: Campbell Lerner, MD;  Location: [facility] ORS;  Service: General;  Laterality: N/A;    No Known Allergies  Objective:  General:  Well developed, nourished, in no acute distress, alert and oriented x3   Dermatology: Skin is warm, dry and supple bilateral.  Medial border left great toe is tender with evidence of an ingrowing nail. Pain on palpation noted to the border of the nail fold. The remaining nails appear unremarkable at this time.  Hyperkeratotic elongated dystrophic nails noted 1-5 bilateral  Vascular: DP and PT pulses palpable.  No clinical evidence of vascular compromise  Neruologic: Grossly intact via light touch bilateral.  Musculoskeletal: No pedal deformity noted  Assesement: #1 Paronychia with ingrowing nail medial border left great toe #2 pain due to onychomycosis of toenails both  Plan of Care:  -Patient evaluated.  -Mechanical debridement of nails 1-5 bilateral was performed using a nail nipper without incident or bleeding -Discussed treatment alternatives and plan of care. Explained nail avulsion procedure and post procedure course to patient. -Patient opted for permanent partial nail avulsion of the ingrown portion of the nail.  -Prior to procedure, local anesthesia infiltration utilized using 3 ml of a 50:50 mixture of 2% plain lidocaine and 0.5% plain marcaine in a normal hallux block fashion and a betadine prep performed.  -Partial permanent nail avulsion with chemical matrixectomy performed using 3x30sec applications of phenol followed by alcohol flush.  -Light dressing applied.  Post care instructions provided -Return to clinic 3 weeks  Felecia Shelling, DPM [facility]  Dr. Felecia Shelling, DPM    [address] [place], [place] [address]                Office [phone]  Fax [phone]

## 2022-12-30 ENCOUNTER — Ambulatory Visit (HOSPITAL_BASED_OUTPATIENT_CLINIC_OR_DEPARTMENT_OTHER): Payer: MEDICAID | Admitting: Orthopaedic Surgery

## 2022-12-30 ENCOUNTER — Telehealth: Payer: Self-pay | Admitting: Podiatry

## 2022-12-30 ENCOUNTER — Encounter: Payer: Self-pay | Admitting: Podiatry

## 2022-12-30 NOTE — Telephone Encounter (Signed)
Pt called and is needing a letter for work last night as she was still having a lot of pain. She left message yesterday as well asking if she could get a boot to take pressure of the toe.   She is asking if you would call in antibiotic as well. She was throwing up yesterday.  Pharmacy is correct in chart. She would like to pick up note around 1000am today

## 2023-01-05 ENCOUNTER — Encounter: Payer: Self-pay | Admitting: Podiatry

## 2023-01-05 ENCOUNTER — Other Ambulatory Visit: Payer: Self-pay | Admitting: Podiatry

## 2023-01-05 MED ORDER — DOXYCYCLINE HYCLATE 100 MG PO TABS: 100.0000 mg | ORAL_TABLET | Freq: Two times a day (BID) | ORAL | 0 refills | Status: DC

## 2023-02-02 ENCOUNTER — Ambulatory Visit (HOSPITAL_COMMUNITY): Payer: MEDICAID | Admitting: Mental Health

## 2023-03-01 ENCOUNTER — Other Ambulatory Visit: Payer: Self-pay

## 2023-03-01 ENCOUNTER — Emergency Department (HOSPITAL_COMMUNITY): Payer: MEDICAID

## 2023-03-01 ENCOUNTER — Encounter (HOSPITAL_COMMUNITY): Payer: Self-pay

## 2023-03-01 ENCOUNTER — Emergency Department (HOSPITAL_COMMUNITY)
Admission: EM | Admit: 2023-03-01 | Discharge: 2023-03-02 | Discharge status: Against Medical Advice | Payer: MEDICAID | Attending: Student | Admitting: Student

## 2023-03-01 DIAGNOSIS — R519 Headache, unspecified: Secondary | ICD-10-CM | POA: Insufficient documentation

## 2023-03-01 DIAGNOSIS — Z5321 Procedure and treatment not carried out due to patient leaving prior to being seen by health care provider: Secondary | ICD-10-CM | POA: Diagnosis not present

## 2023-03-01 LAB — BASIC METABOLIC PANEL
Anion gap: 5 (ref 5–15)
BUN: 6 mg/dL (ref 6–20)
CO2: 27 mmol/L (ref 22–32)
Calcium: 8.8 mg/dL — ABNORMAL LOW (ref 8.9–10.3)
Chloride: 106 mmol/L (ref 98–111)
Creatinine, Ser: 1.15 mg/dL — ABNORMAL HIGH (ref 0.44–1.00)
GFR, Estimated: 58 mL/min — ABNORMAL LOW (ref >60)
Glucose, Bld: 102 mg/dL — ABNORMAL HIGH (ref 70–99)
Potassium: 3.4 mmol/L — ABNORMAL LOW (ref 3.5–5.1)
Sodium: 138 mmol/L (ref 135–145)

## 2023-03-01 LAB — CBC
HCT: 32.1 % — ABNORMAL LOW (ref 36.0–46.0)
Hemoglobin: 10.2 g/dL — ABNORMAL LOW (ref 12.0–15.0)
MCH: 25.2 pg — ABNORMAL LOW (ref 26.0–34.0)
MCHC: 31.8 g/dL (ref 30.0–36.0)
MCV: 79.3 fL — ABNORMAL LOW (ref 80.0–100.0)
Platelets: 364 10*3/uL (ref 150–400)
RBC: 4.05 MIL/uL (ref 3.87–5.11)
RDW: 16.7 % — ABNORMAL HIGH (ref 11.5–15.5)
WBC: 12.2 10*3/uL — ABNORMAL HIGH (ref 4.0–10.5)
nRBC: 0 % (ref 0.0–0.2)

## 2023-03-01 NOTE — ED Triage Notes (Signed)
Says she has had a headache worse on the left side that radiates to neck and LLE x 2 days beginning Sunday morning.

## 2023-03-02 ENCOUNTER — Telehealth: Payer: Self-pay | Admitting: *Deleted

## 2023-03-02 NOTE — Telephone Encounter (Signed)
Pt called regarding results of labs and work excuse letter.  RNCM informed pt of policy of not releasing medical information via telephone to pt who leaves Against Medical Advice and offered to update mychart password as that would be where she needs to go go lab results.  RNCM advised that she may also not receive work excuse letter after leaving AMA. Pt voiced frustration with wait time but understood directions for updating mychart.

## 2023-03-02 NOTE — ED Notes (Signed)
Pt left. 

## 2023-03-03 ENCOUNTER — Encounter (HOSPITAL_COMMUNITY): Payer: Self-pay

## 2023-03-03 ENCOUNTER — Ambulatory Visit (HOSPITAL_COMMUNITY)
Admission: EM | Admit: 2023-03-03 | Discharge: 2023-03-03 | Disposition: A | Discharge status: Home / Self Care | Payer: MEDICAID | Attending: Nurse Practitioner | Admitting: Nurse Practitioner

## 2023-03-03 DIAGNOSIS — M545 Low back pain, unspecified: Secondary | ICD-10-CM | POA: Diagnosis not present

## 2023-03-03 DIAGNOSIS — M542 Cervicalgia: Secondary | ICD-10-CM | POA: Diagnosis not present

## 2023-03-03 DIAGNOSIS — R350 Frequency of micturition: Secondary | ICD-10-CM

## 2023-03-03 LAB — POCT URINALYSIS DIP (MANUAL ENTRY)
Bilirubin, UA: NEGATIVE
Blood, UA: NEGATIVE
Glucose, UA: NEGATIVE mg/dL
Ketones, POC UA: NEGATIVE mg/dL
Leukocytes, UA: NEGATIVE
Nitrite, UA: NEGATIVE
Protein Ur, POC: NEGATIVE mg/dL
Spec Grav, UA: 1.015 (ref 1.010–1.025)
Urobilinogen, UA: 0.2 U/dL
pH, UA: 6 (ref 5.0–8.0)

## 2023-03-03 MED ORDER — TIZANIDINE HCL 4 MG PO TABS: 4.0000 mg | ORAL_TABLET | Freq: Three times a day (TID) | ORAL | 0 refills | Status: DC | PRN

## 2023-03-03 NOTE — Discharge Instructions (Signed)
Continue Tylenol [phone] mg every 6 hours for pain.  You can take the Tizanidine every 8 hours as needed for muscular pain.  Urine sample today looks great without any signs of infection. Seek care if symptoms do not improve with the muscle relaxant.    Recommend close follow up with PCP if symptoms persist.

## 2023-03-03 NOTE — ED Notes (Signed)
Assisted with scheduling patient for a pcp appt.  Reviewed instructions

## 2023-03-03 NOTE — ED Provider Notes (Signed)
[facility]    CSN: [id number] Arrival date & time: 03/03/23  1850      History   Chief Complaint Chief Complaint  Patient presents with   Torticollis   Abdominal Pain    HPI Margaret [name] is a 52 y.o. female.   Patient presents today with myriad of concerns/complaints.  Reports the entire left side of her body is painful including her neck, down her arm and down her left leg.  No new numbness or tingling in the upper or lower extremities or new weakness, reports some residual weakness after a stroke years ago.  She is also having lower back pain.  Denies recent fall, trauma, or injury to any part of her body.  Has been taking Tylenol for the pain which seems to help temporarily.  No known swelling or bruising.  Patient denies new urinary or bowel incontinence, shooting pain down the legs, decreased sensation of lower extremities, dysuria, hematuria.  Patient also concerned about urinary frequency and urgency that has been ongoing for the past couple of weeks.  Endorses nausea, however no vomiting.  No fevers, urinary incontinence, foul urinary odor, hematuria, abdominal pain, flank pain, or vaginal discharge.  Has not taken anything for symptoms so far.   Patient went to the ER 2 days ago and is asking me to interpret blood work and scans that she had done there.  She left without being seen.  Does not have a primary care provider locally and is looking for one.    Past Medical History:  Diagnosis Date   Anxiety    Arthritis    Asthma    controlled   CVA (cerebral vascular accident) (HCC)    a. 09/2016   Depression    Dysrhythmia    Headache    Hypertension    Hypothyroidism    Morbid obesity (HCC)    PAF (paroxysmal atrial fibrillation) (HCC) 09/2016   a. 30-day event monitor 6/18: predominant rhythm of sinus with isolated PACs and PVCs.  Two narrow complex tachycardia episodes concerning for A. Fib +/-atrial flutter or SVT.  No prolonged pauses; b. CHADS2VASc  = 3 (stroke x 2, female)--> Eliquis.   PFO (patent foramen ovale)    a. TTE 6/18: EF of 60 to 65%, normal wall motion, grade 1 diastolic dysfunction; b. TEE 6/18: evidence of a very small atrial level right to left shunt by bubble study without visualization of ASD or PFO   Substance abuse (HCC)    Past use of cocaine    Tobacco abuse     Patient Active Problem List   Diagnosis Date Noted   Aortic atherosclerosis (HCC) 09/03/2021   Adjustment disorder with mixed anxiety and depressed mood 08/05/2021   Insomnia, unspecified 08/05/2021   Right sided weakness 10/24/2020   Essential hypertension 10/24/2020   Constipation 06/27/2020   Fatigue 04/05/2020   Encounter to establish care with new doctor 01/22/2020   Encounter for hepatitis C screening test for low risk patient 01/22/2020   Screening examination for sexually transmitted disease 01/22/2020   Skin tag 01/22/2020   Nonintractable headache 01/22/2020   Status post laparoscopic cholecystectomy 09/28/2019   Dyspnea on exertion 08/15/2018   Multinodular goiter 04/28/2017   Paroxysmal atrial fibrillation (HCC) 10/29/2016   PFO (patent foramen ovale) 09/23/2016   Hyperlipidemia 09/17/2016   IFG (impaired fasting glucose) 09/17/2016   History of stroke 09/06/2016   Myopia 12/20/2014   Abdominal pain 12/20/2014   Depression 12/20/2014   Right arm  pain 11/21/2014   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 10/19/2014   Thyroid activity decreased 10/18/2014   Abnormal CBC 10/18/2014   Tobacco abuse 10/18/2014   Previous known suicide attempt 04/20/2014    Past Surgical History:  Procedure Laterality Date   FRACTURE SURGERY     TEE WITHOUT CARDIOVERSION N/A 09/08/2016   Procedure: TRANSESOPHAGEAL ECHOCARDIOGRAM (TEE);  Surgeon: [name], MD;  Location: [facility];  Service: Cardiovascular;  Laterality: N/A;   XI ROBOTIC ASSISTED VENTRAL HERNIA N/A 09/13/2019   Procedure: XI ROBOTIC Cholecystectomy,  with primary non-mesh repair of EPIGATRIC HERNIA;  Surgeon: Campbell Lerner, MD;  Location: [facility] ORS;  Service: General;  Laterality: N/A;    OB History   No obstetric history on file.      Home Medications    Prior to Admission medications   Medication Sig Start Date End Date Taking? Authorizing Provider  tiZANidine (ZANAFLEX) 4 MG tablet Take 1 tablet (4 mg total) by mouth every 8 (eight) hours as needed for muscle spasms. Do not take with alcohol or while driving or operating heavy machinery.  May cause drowsiness. 03/03/23  Yes Valentino Nose, NP  apixaban (ELIQUIS) 5 MG TABS tablet Take 1 tablet (5 mg total) by mouth 2 (two) times daily. 12/17/22 06/15/23  Alver Sorrow, NP  atorvastatin (LIPITOR) 40 MG tablet Take 1 tablet (40 mg total) by mouth daily. 12/17/22   Alver Sorrow, NP    Family History Family History  Problem Relation Age of Onset   Stroke Father    Hypertension Daughter    Migraines Daughter    Cervical cancer Maternal Grandmother    Heart disease Maternal Grandfather    Stroke Maternal Grandfather     Social History Social History   Tobacco Use   Smoking status: Every Day    Current packs/day: 10.00    Types: Cigarettes   Smokeless tobacco: Former    Types: Snuff   Tobacco comments:    trying to quit; going to start using patches;   Vaping Use   Vaping status: Never Used  Substance Use Topics   Alcohol use: Yes    Comment: on occasion   Drug use: Not Currently    Types: Marijuana    Comment: Previously, but stopped years ago     Allergies   Patient has no known allergies.   Review of Systems Review of Systems Per HPI  Physical Exam Triage Vital Signs ED Triage Vitals  Encounter Vitals Group     BP 03/03/23 1908 (!) 132/91     Systolic BP Percentile --      Diastolic BP Percentile --      Pulse Rate 03/03/23 1908 73     Resp 03/03/23 1908 16     Temp 03/03/23 1908 98.4 F (36.9 C)     Temp Source 03/03/23 1908 Oral      SpO2 03/03/23 1908 96 %     Weight 03/03/23 1905 240 lb (108.9 kg)     Height 03/03/23 1905 5\' 5"  (1.651 m)     Head Circumference --      Peak Flow --      Pain Score 03/03/23 1904 8     Pain Loc --      Pain Education --      Exclude from Growth Chart --    No data found.  Updated Vital Signs BP (!) 132/91 (BP Location: Right Arm)   Pulse 73   Temp  98.4 F (36.9 C) (Oral)   Resp 16   Ht 5\' 5"  (1.651 m)   Wt 240 lb (108.9 kg)   LMP 05/07/2016   SpO2 96%   BMI 39.94 kg/m   Visual Acuity Right Eye Distance:   Left Eye Distance:   Bilateral Distance:    Right Eye Near:   Left Eye Near:    Bilateral Near:     Physical Exam Vitals and nursing note reviewed.  Constitutional:      General: She is not in acute distress.    Appearance: Normal appearance. She is not toxic-appearing.  HENT:     Mouth/Throat:     Mouth: Mucous membranes are moist.     Pharynx: Oropharynx is clear.  Pulmonary:     Effort: Pulmonary effort is normal. No respiratory distress.  Abdominal:     General: Abdomen is flat.     Palpations: Abdomen is soft.     Tenderness: There is no abdominal tenderness.  Musculoskeletal:     Comments: Patient appears to be moving all 4 extremities equally and without difficulty.  Patient has equal strength and sensation to bilateral upper and lower extremities.  She is neurovascularly intact in distal upper and lower extremities.  No bruising, swelling, or warmth noted to lumbar spine, left lower extremity, left arm.  Skin:    General: Skin is warm and dry.     Capillary Refill: Capillary refill takes less than 2 seconds.     Coloration: Skin is not jaundiced or pale.     Findings: No erythema.  Neurological:     Mental Status: She is alert and oriented to person, place, and time.  Psychiatric:        Behavior: Behavior is cooperative.      UC Treatments / Results  Labs (all labs ordered are listed, but only abnormal results are displayed) Labs  Reviewed  POCT URINALYSIS DIP (MANUAL ENTRY)    EKG   Radiology  Procedures Procedures (including critical care time)  Medications Ordered in UC Medications - No data to display  Initial Impression / Assessment and Plan / UC Course  I have reviewed the triage vital signs and the nursing notes.  Pertinent labs & imaging results that were available during my care of the patient were reviewed by me and considered in my medical decision making (see chart for details).   Patient is well-appearing, normotensive, afebrile, not tachycardic, not tachypneic, oxygenating well on room air.    1. Neck pain on left side 2. Acute midline low back pain without sciatica Appears to be musculoskeletal in nature No red flags Treat with continued Tylenol, start muscle relaxant Patient not a good candidate for NSAIDs given Eliquis use Recommended follow-up with PCP if symptoms persist despite treatment and I requested nurse to help her make an appointment today  3. Urinary frequency Urinalysis unremarkable Recommended follow-up with PCP regarding symptoms Increase water intake in the meantime as it looks like recent blood work shows she is little bit dehydrated, she is also a little bit anemic and will need to follow-up with PCP regarding the  The patient was given the opportunity to ask questions.  All questions answered to their satisfaction.  The patient is in agreement to this plan.    Final Clinical Impressions(s) / UC Diagnoses   Final diagnoses:  Neck pain on left side  Acute midline low back pain without sciatica  Urinary frequency     Discharge Instructions  Continue Tylenol [phone] every 6 hours for pain.  You can take the Tizanidine every 8 hours as needed for muscular pain.  Urine sample today looks great without any signs of infection. Seek care if symptoms do not improve with the muscle relaxant.    Recommend close follow up with PCP if symptoms persist.     ED  Prescriptions     Medication Sig Dispense Auth. Provider   tiZANidine (ZANAFLEX) 4 MG tablet Take 1 tablet (4 mg total) by mouth every 8 (eight) hours as needed for muscle spasms. Do not take with alcohol or while driving or operating heavy machinery.  May cause drowsiness. 30 tablet Valentino Nose, NP      PDMP not reviewed this encounter.   Valentino Nose, NP 03/03/23 [id number]

## 2023-03-03 NOTE — ED Triage Notes (Signed)
Patient here today with c/o left side of neck pain and stiffness since Friday. Patient is also having some LB pain and left knee pain. Patient has been taking some Tylenol with some relief.   Patient is also having some lower abd pain X 2 days.

## 2023-03-08 ENCOUNTER — Ambulatory Visit: Payer: MEDICAID | Admitting: Nurse Practitioner

## 2023-03-15 ENCOUNTER — Ambulatory Visit: Payer: MEDICAID | Admitting: Adult Health

## 2023-05-12 ENCOUNTER — Encounter: Payer: Self-pay | Admitting: Podiatry

## 2023-05-12 ENCOUNTER — Ambulatory Visit (INDEPENDENT_AMBULATORY_CARE_PROVIDER_SITE_OTHER): Payer: MEDICAID | Admitting: Podiatry

## 2023-05-12 ENCOUNTER — Ambulatory Visit (INDEPENDENT_AMBULATORY_CARE_PROVIDER_SITE_OTHER): Payer: MEDICAID

## 2023-05-12 VITALS — Ht 65.0 in | Wt 240.0 lb

## 2023-05-12 DIAGNOSIS — M2142 Flat foot [pes planus] (acquired), left foot: Secondary | ICD-10-CM | POA: Diagnosis not present

## 2023-05-12 DIAGNOSIS — Q6652 Congenital pes planus, left foot: Secondary | ICD-10-CM | POA: Diagnosis not present

## 2023-05-12 DIAGNOSIS — M2141 Flat foot [pes planus] (acquired), right foot: Secondary | ICD-10-CM

## 2023-05-12 DIAGNOSIS — G8929 Other chronic pain: Secondary | ICD-10-CM

## 2023-05-12 MED ORDER — MELOXICAM 15 MG PO TABS: 15.0000 mg | ORAL_TABLET | Freq: Every day | ORAL | 1 refills | Status: DC

## 2023-05-12 MED ORDER — METHYLPREDNISOLONE 4 MG PO TBPK: ORAL_TABLET | ORAL | 0 refills | Status: DC

## 2023-05-12 NOTE — Progress Notes (Signed)
   Chief Complaint  Patient presents with   Ankle Pain    Pt is here for left ankle pain states she has been in pain for a few months no injury to ankle.    HPI: 53 y.o. female presenting today for a new complaint of pain and tenderness associated to the medial aspect of the left ankle ongoing for few months now.  Patient does admit to walking around the house barefoot.  She has not been anything for treatment.  No history of trauma.  Past Medical History:  Diagnosis Date   Anxiety    Arthritis    Asthma    controlled   CVA (cerebral vascular accident) (HCC)    a. 09/2016   Depression    Dysrhythmia    Headache    Hypertension    Hypothyroidism    Morbid obesity (HCC)    PAF (paroxysmal atrial fibrillation) (HCC) 09/2016   a. 30-day event monitor 6/18: predominant rhythm of sinus with isolated PACs and PVCs.  Two narrow complex tachycardia episodes concerning for A. Fib +/-atrial flutter or SVT.  No prolonged pauses; b. CHADS2VASc = 3 (stroke x 2, female)--> Eliquis .   PFO (patent foramen ovale)    a. TTE 6/18: EF of 60 to 65%, normal wall motion, grade 1 diastolic dysfunction; b. TEE 6/18: evidence of a very small atrial level right to left shunt by bubble study without visualization of ASD or PFO   Substance abuse (HCC)    Past use of cocaine    Tobacco abuse      Physical Exam: General: The patient is alert and oriented x3 in no acute distress.  Dermatology: Skin is warm, dry and supple bilateral lower extremities. Negative for open lesions or macerations.  Vascular: Palpable pedal pulses bilaterally. No edema or erythema noted. Capillary refill within normal limits.  Neurological: Grossly intact via light touch  Musculoskeletal Exam: Pain on palpation noted along posterior tibial tendon of the left lower extremity. Range of motion within normal limits. Muscle strength 5/5 in all muscle groups bilateral lower extremities.  With weightbearing there is collapse of the medial  longitudinal arch of the foot and rearfoot valgus deformity  Radiographic Exam LT foot 05/12/2023:  No acute fracture identified.  No osseous irregularity.  Decreased calcaneal inclination angle and metatarsal declination angle and flatfoot deformity noted  Assessment: 1. Posterior tibial tendinitis left 2.  Pes planovalgus deformity bilateral   Plan of Care:  -Patient evaluated.  X-rays reviewed -Patient declined injection today -Prescription for Medrol  Dosepak -Prescription for meloxicam  15 mg daily -Discussed the necessity for arch supports.  Both custom and prefabricated insoles were discussed.  For now we will try prefabricated insoles since she is not sure if insurance will cover the custom orthotics. -OTC power step insoles dispensed.  Wear daily -Stressed the importance of not going barefoot around the house.  Recommend good arch supports even around the home -Return to clinic 4 weeks  *Nurse   Thresa EMERSON Sar, DPM [facility]  Dr. Thresa EMERSON Sar, DPM    [address] [place], [place] [address]                Office [phone]  Fax [phone]

## 2023-05-23 ENCOUNTER — Encounter (HOSPITAL_COMMUNITY): Payer: Self-pay

## 2023-05-23 ENCOUNTER — Ambulatory Visit (HOSPITAL_COMMUNITY)
Admission: EM | Admit: 2023-05-23 | Discharge: 2023-05-23 | Disposition: A | Discharge status: Home / Self Care | Payer: MEDICAID

## 2023-05-23 ENCOUNTER — Ambulatory Visit: Payer: Self-pay

## 2023-05-23 DIAGNOSIS — G9339 Other post infection and related fatigue syndromes: Secondary | ICD-10-CM

## 2023-05-23 DIAGNOSIS — J069 Acute upper respiratory infection, unspecified: Secondary | ICD-10-CM

## 2023-05-23 LAB — POC COVID19/FLU A&B COMBO
Covid Antigen, POC: NEGATIVE
Influenza A Antigen, POC: NEGATIVE
Influenza B Antigen, POC: NEGATIVE

## 2023-05-23 MED ORDER — PREDNISONE 20 MG PO TABS: 40.0000 mg | ORAL_TABLET | Freq: Every day | ORAL | 0 refills | Status: AC

## 2023-05-23 MED ORDER — DEXAMETHASONE SODIUM PHOSPHATE 10 MG/ML IJ SOLN: 10.0000 mg | Freq: Once | INTRAMUSCULAR | Status: DC

## 2023-05-23 MED ORDER — KETOROLAC TROMETHAMINE 30 MG/ML IJ SOLN
30.0000 mg | Freq: Once | INTRAMUSCULAR | Status: AC
  Administered 2023-05-23: 30 mg via INTRAMUSCULAR

## 2023-05-23 MED ORDER — AZITHROMYCIN 250 MG PO TABS: ORAL_TABLET | ORAL | 0 refills | Status: DC

## 2023-05-23 MED ORDER — ALBUTEROL SULFATE HFA 108 (90 BASE) MCG/ACT IN AERS: 1.0000 | INHALATION_SPRAY | Freq: Four times a day (QID) | RESPIRATORY_TRACT | 0 refills | Status: DC | PRN

## 2023-05-23 MED ORDER — METHYLPREDNISOLONE ACETATE 80 MG/ML IJ SUSP
INTRAMUSCULAR | Status: AC
  Filled 2023-05-23: qty 1

## 2023-05-23 MED ORDER — KETOROLAC TROMETHAMINE 30 MG/ML IJ SOLN
INTRAMUSCULAR | Status: AC
  Filled 2023-05-23: qty 1

## 2023-05-23 MED ORDER — METHYLPREDNISOLONE ACETATE 40 MG/ML IJ SUSP
40.0000 mg | Freq: Once | INTRAMUSCULAR | Status: AC
  Administered 2023-05-23: 40 mg via INTRAMUSCULAR

## 2023-05-23 NOTE — Discharge Instructions (Addendum)
Flu A, flu B and COVID testing done today. These are negative. Likely the symptoms are due to an upper respiratory infection. We can treat this with the following:  Toradol injection given today. This is a medication to help with pain. This is not a narcotic.  Medrol injection given today. This is a steroid to help with inflammation and pain. Azithromycin 250mg  Take 2 tablets today and the 1 tablet daily for 4 more days. Prednisone 40 mg (2 tablets) once daily for 5 days. Take this in the morning.  This is a steroid to help with inflammation and pain.  Start this medication on 2/17. Albuterol inhaler 1-2 puffs every 6 hours as needed for wheezing/shortness of breath. Rest and stay hydrated.  Drink plenty of fluids even if you aren't able to take in solid foods.  Return to urgent care or PCP if symptoms worsen or fail to resolve.

## 2023-05-23 NOTE — ED Triage Notes (Addendum)
Pt c/o vomiting, diarrhea, fatigue, weakness, dizziness, shortness of breath, headache, body aches, and bilateral lower back pain.  Start date: Either 05/19/2023 or 05/20/2023  Home Interventions: Pain Patch (Unsure of Name) and Fluids

## 2023-05-23 NOTE — ED Provider Notes (Signed)
[facility]    CSN: [id number] Arrival date & time: 05/23/23  1648      History   Chief Complaint Chief Complaint  Patient presents with   Fatigue   Headache    HPI Margaret [name] is a 53 y.o. female.   52 year old female who presents urgent care with complaints of headache, body ache, nausea, vomiting, diarrhea, fatigue, weakness and lower back pain.  Her symptoms started about 3 days ago.  Yesterday she had nausea vomiting and diarrhea but today her main complaint is the body ache, weakness and headache.  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  She reports having fevers and chills.  She has had a very poor appetite but is trying to stay hydrated.   Headache Associated symptoms: diarrhea, fatigue, nausea, vomiting and weakness (Generalized)   Associated symptoms: no abdominal pain, no back pain, no cough, no ear pain, no eye pain, no fever, no seizures and no sore throat     Past Medical History:  Diagnosis Date   Anxiety    Arthritis    Asthma    controlled   CVA (cerebral vascular accident) (HCC)    a. 09/2016   Depression    Dysrhythmia    Headache    Hypertension    Hypothyroidism    Morbid obesity (HCC)    PAF (paroxysmal atrial fibrillation) (HCC) 09/2016   a. 30-day event monitor 6/18: predominant rhythm of sinus with isolated PACs and PVCs.  Two narrow complex tachycardia episodes concerning for A. Fib +/-atrial flutter or SVT.  No prolonged pauses; b. CHADS2VASc = 3 (stroke x 2, female)--> Eliquis.   PFO (patent foramen ovale)    a. TTE 6/18: EF of 60 to 65%, normal wall motion, grade 1 diastolic dysfunction; b. TEE 6/18: evidence of a very small atrial level right to left shunt by bubble study without visualization of ASD or PFO   Substance abuse (HCC)    Past use of cocaine    Tobacco abuse     Patient Active Problem List   Diagnosis Date Noted   Aortic atherosclerosis (HCC) 09/03/2021   Adjustment disorder with mixed  anxiety and depressed mood 08/05/2021   Insomnia, unspecified 08/05/2021   Right sided weakness 10/24/2020   Essential hypertension 10/24/2020   Constipation 06/27/2020   Fatigue 04/05/2020   Encounter to establish care with new doctor 01/22/2020   Encounter for hepatitis C screening test for low risk patient 01/22/2020   Screening examination for sexually transmitted disease 01/22/2020   Skin tag 01/22/2020   Nonintractable headache 01/22/2020   Status post laparoscopic cholecystectomy 09/28/2019   Dyspnea on exertion 08/15/2018   Multinodular goiter 04/28/2017   Paroxysmal atrial fibrillation (HCC) 10/29/2016   PFO (patent foramen ovale) 09/23/2016   Hyperlipidemia 09/17/2016   IFG (impaired fasting glucose) 09/17/2016   History of stroke 09/06/2016   Myopia 12/20/2014   Abdominal pain 12/20/2014   Depression 12/20/2014   Right arm pain 11/21/2014   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 10/19/2014   Thyroid activity decreased 10/18/2014   Abnormal CBC 10/18/2014   Tobacco abuse 10/18/2014   Previous known suicide attempt 04/20/2014    Past Surgical History:  Procedure Laterality Date   FRACTURE SURGERY     TEE WITHOUT CARDIOVERSION N/A 09/08/2016   Procedure: TRANSESOPHAGEAL ECHOCARDIOGRAM (TEE);  Surgeon: [name], MD;  Location: [facility];  Service: Cardiovascular;  Laterality: N/A;   XI ROBOTIC ASSISTED VENTRAL HERNIA  N/A 09/13/2019   Procedure: XI ROBOTIC Cholecystectomy, with primary non-mesh repair of EPIGATRIC HERNIA;  Surgeon: Campbell Lerner, MD;  Location: [facility];  Service: General;  Laterality: N/A;    OB History   No obstetric history on file.      Home Medications    Prior to Admission medications   Medication Sig Start Date End Date Taking? Authorizing Provider  apixaban (ELIQUIS) 5 MG TABS tablet Take 1 tablet (5 mg total) by mouth 2 (two) times daily. 12/17/22 06/15/23 Yes Alver Sorrow, NP  atorvastatin  (LIPITOR) 40 MG tablet Take 1 tablet (40 mg total) by mouth daily. 12/17/22  Yes Alver Sorrow, NP  metoprolol tartrate (LOPRESSOR) 25 MG tablet Take 25 mg by mouth 2 (two) times daily. Unsure of dose   Yes [provider]  meloxicam (MOBIC) 15 MG tablet Take 1 tablet (15 mg total) by mouth daily. 05/12/23 09/09/23  Felecia Shelling, DPM  methylPREDNISolone (MEDROL DOSEPAK) 4 MG TBPK tablet 6 day dose pack - take as directed 05/12/23   Felecia Shelling, DPM  tiZANidine (ZANAFLEX) 4 MG tablet Take 1 tablet (4 mg total) by mouth every 8 (eight) hours as needed for muscle spasms. Do not take with alcohol or while driving or operating heavy machinery.  May cause drowsiness. 03/03/23   Valentino Nose, NP    Family History Family History  Problem Relation Age of Onset   Stroke Father    Hypertension Daughter    Migraines Daughter    Cervical cancer Maternal Grandmother    Heart disease Maternal Grandfather    Stroke Maternal Grandfather     Social History Social History   Tobacco Use   Smoking status: Every Day    Current packs/day: 10.00    Types: Cigarettes   Smokeless tobacco: Former    Types: Snuff   Tobacco comments:    trying to quit; going to start using patches;   Vaping Use   Vaping status: Never Used  Substance Use Topics   Alcohol use: Yes    Comment: on occasion   Drug use: Yes    Types: Marijuana    Comment: occ     Allergies   Patient has no known allergies.   Review of Systems Review of Systems  Constitutional:  Positive for appetite change and fatigue. Negative for chills and fever.  HENT:  Negative for ear pain and sore throat.   Eyes:  Negative for pain and visual disturbance.  Respiratory:  Negative for cough and shortness of breath.   Cardiovascular:  Negative for chest pain and palpitations.  Gastrointestinal:  Positive for diarrhea, nausea and vomiting. Negative for abdominal pain.  Genitourinary:  Negative for dysuria and hematuria.   Musculoskeletal:  Negative for arthralgias and back pain.       Generalized body aches  Skin:  Negative for color change and rash.  Neurological:  Positive for weakness (Generalized) and headaches. Negative for seizures and syncope.  All other systems reviewed and are negative.    Physical Exam Triage Vital Signs ED Triage Vitals  Encounter Vitals Group     BP 05/23/23 1743 115/80     Systolic BP Percentile --      Diastolic BP Percentile --      Pulse Rate 05/23/23 1743 91     Resp 05/23/23 1743 16     Temp 05/23/23 1743 98 F (36.7 C)     Temp Source 05/23/23 1743 Oral  SpO2 05/23/23 1743 97 %     Weight --      Height --      Head Circumference --      Peak Flow --      Pain Score 05/23/23 1740 9     Pain Loc --      Pain Education --      Exclude from Growth Chart --    No data found.  Updated Vital Signs BP 115/80 (BP Location: Right Arm)   Pulse 91   Temp 98 F (36.7 C) (Oral)   Resp 16   LMP 05/07/2016   SpO2 97%   Visual Acuity Right Eye Distance:   Left Eye Distance:   Bilateral Distance:    Right Eye Near:   Left Eye Near:    Bilateral Near:     Physical Exam Vitals and nursing note reviewed.  Constitutional:      General: She is not in acute distress.    Appearance: She is well-developed.     Comments: Patient appears to not feel well  HENT:     Head: Normocephalic and atraumatic.     Right Ear: Tympanic membrane normal.     Left Ear: Tympanic membrane normal.     Nose: Nose normal.     Mouth/Throat:     Mouth: Mucous membranes are moist.  Eyes:     Conjunctiva/sclera: Conjunctivae normal.  Cardiovascular:     Rate and Rhythm: Normal rate and regular rhythm.     Heart sounds: No murmur heard. Pulmonary:     Effort: Pulmonary effort is normal. No respiratory distress.     Breath sounds: Normal breath sounds. No decreased breath sounds, wheezing or rhonchi.  Abdominal:     Palpations: Abdomen is soft.     Tenderness: There is no  abdominal tenderness.  Musculoskeletal:        General: No swelling.     Cervical back: Neck supple.  Skin:    General: Skin is warm and dry.     Capillary Refill: Capillary refill takes less than 2 seconds.  Neurological:     Mental Status: She is alert.     Cranial Nerves: No cranial nerve deficit.  Psychiatric:        Mood and Affect: Mood normal.      UC Treatments / Results  Labs (all labs ordered are listed, but only abnormal results are displayed) Labs Reviewed  POC COVID19/FLU A&B COMBO    EKG   Radiology No results found.  Procedures Procedures (including critical care time)  Medications Ordered in UC Medications  ketorolac (TORADOL) 30 MG/ML injection 30 mg (has no administration in time range)  dexamethasone (DECADRON) injection 10 mg (has no administration in time range)    Initial Impression / Assessment and Plan / UC Course  I have reviewed the triage vital signs and the nursing notes.  Pertinent labs & imaging results that were available during my care of the patient were reviewed by me and considered in my medical decision making (see chart for details).     No diagnosis found.   Flu A, flu B and COVID testing done today. These are negative. Likely the symptoms are due to an upper respiratory infection. We can treat this with the following:  Toradol injection given today. This is a medication to help with pain. This is not a narcotic.  Medrol injection given today. This is a steroid to help with inflammation and pain. Azithromycin 250mg  Take  2 tablets today and the 1 tablet daily for 4 more days. Prednisone 40 mg (2 tablets) once daily for 5 days. Take this in the morning.  This is a steroid to help with inflammation and pain.  Start this medication on 2/17. Albuterol inhaler 1-2 puffs every 6 hours as needed for wheezing/shortness of breath. Rest and stay hydrated.  Drink plenty of fluids even if you aren't able to take in solid foods.  Return to  urgent care or PCP if symptoms worsen or fail to resolve.     Final Clinical Impressions(s) / UC Diagnoses   Final diagnoses:  None     Discharge Instructions      Flu A, flu B and COVID testing done today. Toradol injection given today. This is a medication to help with pain. This is not a narcotic.  Decadron injection given today. This is a steroid to help with inflammation and pain. Prednisone 40 mg (2 tablets) once daily for 5 days. Take this in the morning.  This is a steroid to help with inflammation and pain.  Start this medication on 2/17. Promethazine DM 5 mL every 8 hours as needed for cough.  Use caution as this medication can cause drowsiness. Albuterol inhaler 1-2 puffs every 6 hours as needed for wheezing/shortness of breath.    ED Prescriptions   None    PDMP not reviewed this encounter.   Landis Martins, [place] 05/23/23 1851

## 2023-05-24 ENCOUNTER — Ambulatory Visit (HOSPITAL_COMMUNITY): Payer: MEDICAID

## 2023-06-08 ENCOUNTER — Ambulatory Visit: Payer: MEDICAID | Admitting: Podiatry

## 2023-06-14 ENCOUNTER — Encounter: Payer: MEDICAID | Admitting: Podiatry

## 2023-06-14 NOTE — Progress Notes (Signed)
Patient did not show for scheduled appointment today.

## 2023-06-28 ENCOUNTER — Ambulatory Visit: Payer: MEDICAID | Admitting: Podiatry

## 2023-06-29 ENCOUNTER — Ambulatory Visit (INDEPENDENT_AMBULATORY_CARE_PROVIDER_SITE_OTHER): Payer: MEDICAID | Admitting: Podiatry

## 2023-06-29 ENCOUNTER — Encounter: Payer: Self-pay | Admitting: Podiatry

## 2023-06-29 DIAGNOSIS — M216X2 Other acquired deformities of left foot: Secondary | ICD-10-CM

## 2023-06-29 DIAGNOSIS — M66872 Spontaneous rupture of other tendons, left ankle and foot: Secondary | ICD-10-CM

## 2023-06-29 MED ORDER — MELOXICAM 15 MG PO TABS: 15.0000 mg | ORAL_TABLET | Freq: Every day | ORAL | 3 refills | Status: DC

## 2023-06-29 MED ORDER — METHYLPREDNISOLONE 4 MG PO TBPK: ORAL_TABLET | ORAL | 0 refills | Status: DC

## 2023-06-29 NOTE — Progress Notes (Signed)
 Margaret [name] presents today chief complaint of pain and swelling to her left foot.  States that her foot is still giving her a lot of grief.  States that she is not sure whether she took the anti-inflammatories that Dr. Logan Bores prescribed last visit.  Objective: Vital signs are stable alert oriented x 3.  She has severe pain and swelling on palpation of the posterior tibial tendon with pain on inversion against resistance.  There is considerable fluctuance on palpation of the area.  Assessment: Probable tear of the posterior tibial tendon left ankle.  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  Plan: This point we are requesting cam boot we dispensed also requested MRI for the left ankle and and starting her on methylprednisolone to be followed by meloxicam.  I did discuss with her today the possibility for surgical intervention she understands this and is amenable to it if necessary.  Follow-up with her once this comes in.

## 2023-06-30 ENCOUNTER — Telehealth: Payer: Self-pay | Admitting: Podiatry

## 2023-06-30 NOTE — Telephone Encounter (Signed)
 Patient called and would like a doctors note for her to be out of work today. Thank you.

## 2023-07-01 ENCOUNTER — Encounter: Payer: Self-pay | Admitting: Podiatry

## 2023-07-02 ENCOUNTER — Ambulatory Visit
Admission: RE | Admit: 2023-07-02 | Discharge: 2023-07-02 | Disposition: A | Discharge status: Home / Self Care | Payer: MEDICAID | Source: Ambulatory Visit | Attending: Podiatry | Admitting: Podiatry

## 2023-07-02 DIAGNOSIS — M66872 Spontaneous rupture of other tendons, left ankle and foot: Secondary | ICD-10-CM

## 2023-07-03 ENCOUNTER — Ambulatory Visit
Admission: RE | Admit: 2023-07-03 | Discharge: 2023-07-03 | Disposition: A | Discharge status: Home / Self Care | Payer: MEDICAID | Source: Ambulatory Visit | Attending: Podiatry | Admitting: Podiatry

## 2023-07-06 ENCOUNTER — Telehealth: Payer: Self-pay | Admitting: Podiatry

## 2023-07-06 NOTE — Telephone Encounter (Signed)
 Patient is requesting results from x-rays for left ankle. Patient contact telephone number, [phone]

## 2023-07-13 ENCOUNTER — Telehealth: Payer: Self-pay | Admitting: Cardiology

## 2023-07-13 ENCOUNTER — Emergency Department (HOSPITAL_COMMUNITY)
Admission: EM | Admit: 2023-07-13 | Discharge: 2023-07-13 | Discharge status: Against Medical Advice | Payer: MEDICAID | Attending: Emergency Medicine | Admitting: Emergency Medicine

## 2023-07-13 ENCOUNTER — Encounter (HOSPITAL_COMMUNITY): Payer: Self-pay | Admitting: *Deleted

## 2023-07-13 ENCOUNTER — Other Ambulatory Visit: Payer: Self-pay

## 2023-07-13 ENCOUNTER — Emergency Department (HOSPITAL_COMMUNITY): Payer: MEDICAID

## 2023-07-13 DIAGNOSIS — R519 Headache, unspecified: Secondary | ICD-10-CM | POA: Diagnosis present

## 2023-07-13 DIAGNOSIS — R29898 Other symptoms and signs involving the musculoskeletal system: Secondary | ICD-10-CM

## 2023-07-13 DIAGNOSIS — Z79899 Other long term (current) drug therapy: Secondary | ICD-10-CM | POA: Insufficient documentation

## 2023-07-13 DIAGNOSIS — H9201 Otalgia, right ear: Secondary | ICD-10-CM | POA: Insufficient documentation

## 2023-07-13 DIAGNOSIS — R531 Weakness: Secondary | ICD-10-CM | POA: Insufficient documentation

## 2023-07-13 DIAGNOSIS — H538 Other visual disturbances: Secondary | ICD-10-CM | POA: Diagnosis not present

## 2023-07-13 DIAGNOSIS — Z5329 Procedure and treatment not carried out because of patient's decision for other reasons: Secondary | ICD-10-CM | POA: Insufficient documentation

## 2023-07-13 DIAGNOSIS — R0602 Shortness of breath: Secondary | ICD-10-CM | POA: Diagnosis not present

## 2023-07-13 DIAGNOSIS — H539 Unspecified visual disturbance: Secondary | ICD-10-CM

## 2023-07-13 MED ORDER — SODIUM CHLORIDE 0.9 % IV BOLUS
1000.0000 mL | Freq: Once | INTRAVENOUS | Status: AC
  Administered 2023-07-13: 1000 mL via INTRAVENOUS

## 2023-07-13 MED ORDER — DIPHENHYDRAMINE HCL 50 MG/ML IJ SOLN
25.0000 mg | Freq: Once | INTRAMUSCULAR | Status: AC
  Administered 2023-07-13: 25 mg via INTRAVENOUS
  Filled 2023-07-13: qty 1

## 2023-07-13 MED ORDER — TETRACAINE HCL 0.5 % OP SOLN
1.0000 [drp] | Freq: Once | OPHTHALMIC | Status: AC
  Administered 2023-07-13: 1 [drp] via OPHTHALMIC
  Filled 2023-07-13: qty 4

## 2023-07-13 MED ORDER — METOCLOPRAMIDE HCL 5 MG/ML IJ SOLN
10.0000 mg | Freq: Once | INTRAMUSCULAR | Status: AC
  Administered 2023-07-13: 10 mg via INTRAVENOUS
  Filled 2023-07-13: qty 2

## 2023-07-13 NOTE — Telephone Encounter (Signed)
 Patient identification verified by 2 forms.   Called and spoke to patient  Patient states:  -Had SOB with exertion, sometimes while immobile.  -Headache off and on for a month, was on left side, now on right side.  -Left ankle swelling, unable to apply pressure, pain in ankle -Chest pain - unable to describe.   Interventions/Plan: -Pt will monitor symptoms.   Reviewed ED warning signs/precautions  Patient agrees with plan, no questions at this time

## 2023-07-13 NOTE — ED Notes (Signed)
 Pt leaving ama, witnessed conversation explaining benefits and risks of leaving with patient.

## 2023-07-13 NOTE — ED Triage Notes (Addendum)
 Marland Kitchen

## 2023-07-13 NOTE — ED Triage Notes (Signed)
 Pt began having a headache yesterday evening.  Pt is having left ear pain with this.  Pt reports sensitivity to light and sound with this.  No focal neuro deficits.

## 2023-07-13 NOTE — Telephone Encounter (Signed)
 Pt c/o Shortness Of Breath: STAT if SOB developed within the last 24 hours or pt is noticeably SOB on the phone  1. Are you currently SOB (can you hear that pt is SOB on the phone)? no  2. How long have you been experiencing SOB? Couple of days  3. Are you SOB when sitting or when up moving around? both  4. Are you currently experiencing any other symptoms? headache

## 2023-07-13 NOTE — ED Provider Notes (Signed)
 [place] [facility] Provider Note   CSN: [id number] Arrival date & time: 07/13/23  1047     History  Chief Complaint  Patient presents with   Headache    Margaret [name] is a 53 y.o. female, history of CVA, A-fib, who presents to the ED secondary to right-sided headache, right-sided upper extremity weakness, as well as right-sided ear pain, this been going on for the last month.  She states it got worse last night so that is why she decided to come in.  She states that she is having weakness of right upper extremity, and numbness, and that she feels like it is getting worse.  Has not been compliant with her Eliquis, and also reports that she is having a little bit of shortness of breath on exertion.  Denies any swelling of 1 extremity, or sudden shortness of breath.  She notes that she has not gotten around to come to the ER, so decided to come today.  States also that her headache was getting worse so that is why she decided to come.  Denies any hearing changes, but does endorse chronic worsening of vision.  Home Medications Prior to Admission medications   Medication Sig Start Date End Date Taking? Authorizing Provider  albuterol (VENTOLIN HFA) 108 (90 Base) MCG/ACT inhaler Inhale 1-2 puffs into the lungs every 6 (six) hours as needed for wheezing or shortness of breath. 05/23/23   White, Elizabeth A, PA-C  apixaban (ELIQUIS) 5 MG TABS tablet Take 1 tablet (5 mg total) by mouth 2 (two) times daily. 12/17/22 06/15/23  Alver Sorrow, NP  atorvastatin (LIPITOR) 40 MG tablet Take 1 tablet (40 mg total) by mouth daily. 12/17/22   Alver Sorrow, NP  azithromycin (ZITHROMAX) 250 MG tablet Take first 2 tablets together, then 1 every day until finished. 05/23/23   White, Elizabeth A, PA-C  meloxicam (MOBIC) 15 MG tablet Take 1 tablet (15 mg total) by mouth daily. 06/29/23   Hyatt, Max T, DPM  methylPREDNISolone (MEDROL DOSEPAK) 4 MG TBPK tablet 6 day dose pack -  take as directed 06/29/23   Hyatt, Max T, DPM  metoprolol tartrate (LOPRESSOR) 25 MG tablet Take 25 mg by mouth 2 (two) times daily. Unsure of dose    [provider]  tiZANidine (ZANAFLEX) 4 MG tablet Take 1 tablet (4 mg total) by mouth every 8 (eight) hours as needed for muscle spasms. Do not take with alcohol or while driving or operating heavy machinery.  May cause drowsiness. 03/03/23   Valentino Nose, NP      Allergies    Patient has no known allergies.    Review of Systems   Review of Systems  Respiratory:  Positive for shortness of breath.   Cardiovascular:  Negative for chest pain.  Neurological:  Positive for headaches.    Physical Exam Updated Vital Signs BP (!) 140/104 (BP Location: Left Arm)   Pulse 85   Temp 98.2 F (36.8 C)   Resp 18   LMP 05/07/2016   SpO2 99%  Physical Exam Vitals and nursing note reviewed.  Constitutional:      General: She is not in acute distress.    Appearance: She is well-developed.  HENT:     Head: Normocephalic and atraumatic.  Eyes:     General:        Right eye: No discharge.        Left eye: No discharge.  Intraocular pressure: Right eye pressure is 16 mmHg. Left eye pressure is 17 mmHg.     Conjunctiva/sclera: Conjunctivae normal.  Cardiovascular:     Rate and Rhythm: Normal rate and regular rhythm.     Heart sounds: No murmur heard. Pulmonary:     Effort: Pulmonary effort is normal. No respiratory distress.     Breath sounds: Normal breath sounds.  Abdominal:     Palpations: Abdomen is soft.     Tenderness: There is no abdominal tenderness.  Musculoskeletal:        General: No swelling.     Cervical back: Neck supple.  Skin:    General: Skin is warm and dry.     Capillary Refill: Capillary refill takes less than 2 seconds.  Neurological:     Mental Status: She is alert.     Cranial Nerves: No cranial nerve deficit, dysarthria or facial asymmetry.     Sensory: No sensory deficit.     Motor: No  weakness.  Psychiatric:        Mood and Affect: Mood normal.     ED Results / Procedures / Treatments   Labs (all labs ordered are listed, but only abnormal results are displayed) Labs Reviewed  CBC WITH DIFFERENTIAL/PLATELET  COMPREHENSIVE METABOLIC PANEL WITH GFR  TROPONIN I (HIGH SENSITIVITY)    EKG EKG Interpretation Date/Time:  Tuesday July 13 2023 11:57:36 EDT Ventricular Rate:  72 PR Interval:  164 QRS Duration:  80 QT Interval:  390 QTC Calculation: 427 R Axis:   66  Text Interpretation: Normal sinus rhythm Nonspecific T wave abnormality Abnormal ECG When compared with ECG of 20-Nov-2021 17:03, PREVIOUS ECG IS PRESENT Confirmed by Eber Hong ([id number]) on 07/13/2023 12:06:41 PM  Radiology No results found.  Procedures Procedures    Medications Ordered in ED Medications  metoCLOPramide (REGLAN) injection 10 mg (10 mg Intravenous Given 07/13/23 1215)  diphenhydrAMINE (BENADRYL) injection 25 mg (25 mg Intravenous Given 07/13/23 1215)  sodium chloride 0.9 % bolus 1,000 mL (0 mLs Intravenous Stopped 07/13/23 1258)  tetracaine (PONTOCAINE) 0.5 % ophthalmic solution 1-2 drop (1 drop Both Eyes Given 07/13/23 1213)    ED Course/ Medical Decision Making/ A&P                                 Medical Decision Making Patient is a 53 year old female, history of CVA, noncompliant with her Eliquis, and who is here for numbness of the right upper extremity, with weakness as well as blurring of her vision of the eyes, and headache.  Her intraocular pressures are good.  Will obtain a CTA head/neck, for further evaluation, she has no appreciable weaknesses or neurodeficits on my exam.  Patient has decided to leave AGAINST MEDICAL ADVICE, we discussed the risk of doing this, as she has persistent weakness, but the headache is improved.  She is overall well-appearing, and voiced understanding of the risk of associated with this.  Left AGAINST MEDICAL ADVICE informed to follow-up with primary  care doctor, ophthalmology, and return if symptoms worsen  Amount and/or Complexity of Data Reviewed Labs: ordered. Radiology: ordered.  Risk Prescription drug management.    Final Clinical Impression(s) / ED Diagnoses Final diagnoses:  Right arm weakness  Acute nonintractable headache, unspecified headache type  Vision changes  SOB (shortness of breath)    Rx / DC Orders ED Discharge Orders     None  Pete Pelt, [place] 07/13/23 1259    Eber Hong, MD 07/15/23 [phone]

## 2023-07-13 NOTE — Discharge Instructions (Addendum)
 You are choosing to leave AGAINST MEDICAL ADVICE, we have reviewed, the risk of doing this, and he voiced understanding.  I highly recommended a CTA of your head and your neck, which you have chosen to leave without.  I recommend that you follow-up with your primary care doctor, and return if you feel like your symptoms are worsening.  You should also see an eye doctor, as I believe that you may have some vision changes, that may be secondary to an eye etiology.  However is not clear at this time as you did not get the CAT scan.  You are welcome to return anytime to the ER for further evaluation.  Please make sure you are taking your Eliquis as this is very important, to prevent a blood clot

## 2023-07-14 ENCOUNTER — Telehealth: Payer: Self-pay | Admitting: Podiatry

## 2023-07-14 NOTE — Telephone Encounter (Signed)
 Patient wants to know if Xrays have come in and what is the next step?

## 2023-07-14 NOTE — Telephone Encounter (Signed)
 Reviewed by provider. No orders given at this time.

## 2023-07-16 ENCOUNTER — Telehealth: Payer: Self-pay | Admitting: Internal Medicine

## 2023-07-16 NOTE — Telephone Encounter (Signed)
 Left a message for the patient to call back.   Pt also provided with ED precautions

## 2023-07-16 NOTE — Telephone Encounter (Signed)
  Per MyChart scheduling message:  Initial complaint: I'm having some numbness on my right side of my body and tired alot   Shortness of breath and just weak and headaches on my right side and little tingling   Patient denies nausea, vomiting, dizziness or lightheadedness, or syncope

## 2023-07-19 NOTE — Telephone Encounter (Signed)
 Called patient, advised that she wanted someone to monitor her blood sugar and see if this was the cause of her tingling- at one time someone mentioned diabetes. Advised patient that she should see primary care provider- has upcoming appointment on 04/22. Patient verbalized understanding, she will keep this appointment- gave address and time of upcoming appointment as well.   Patient thankful for callback

## 2023-07-22 ENCOUNTER — Ambulatory Visit (INDEPENDENT_AMBULATORY_CARE_PROVIDER_SITE_OTHER): Payer: MEDICAID | Admitting: Podiatry

## 2023-07-22 DIAGNOSIS — M2142 Flat foot [pes planus] (acquired), left foot: Secondary | ICD-10-CM

## 2023-07-22 DIAGNOSIS — M66872 Spontaneous rupture of other tendons, left ankle and foot: Secondary | ICD-10-CM | POA: Diagnosis not present

## 2023-07-22 DIAGNOSIS — M2141 Flat foot [pes planus] (acquired), right foot: Secondary | ICD-10-CM | POA: Diagnosis not present

## 2023-07-22 MED ORDER — MELOXICAM 15 MG PO TABS: 15.0000 mg | ORAL_TABLET | Freq: Every day | ORAL | 0 refills | Status: DC

## 2023-07-22 NOTE — Progress Notes (Signed)
 Subjective:  Patient ID: Margaret [name], female    DOB: Sep 05, 1970,  MRN: [id number]  Chief Complaint  Patient presents with   Foot Pain    Left ankle pain and discomfort pt stated that she had an xray about a month ago    53 y.o. female presents with the above complaint.  Patient presents with complaint of left ankle discomfort/posterior tibial tendinitis.  Patient has been followed up by Dr. Logan Bores and Dr. Al Corpus.  She states that she still continues to bother her.  Steroid pack and meloxicam has helped.  She is here to go over her MRI.  Radiologist has not read her MRI yet   Review of Systems: Negative except as noted in the HPI. Denies N/V/F/Ch.  Past Medical History:  Diagnosis Date   Anxiety    Arthritis    Asthma    controlled   CVA (cerebral vascular accident) (HCC)    a. 09/2016   Depression    Dysrhythmia    Headache    Hypertension    Hypothyroidism    Morbid obesity (HCC)    PAF (paroxysmal atrial fibrillation) (HCC) 09/2016   a. 30-day event monitor 6/18: predominant rhythm of sinus with isolated PACs and PVCs.  Two narrow complex tachycardia episodes concerning for A. Fib +/-atrial flutter or SVT.  No prolonged pauses; b. CHADS2VASc = 3 (stroke x 2, female)--> Eliquis.   PFO (patent foramen ovale)    a. TTE 6/18: EF of 60 to 65%, normal wall motion, grade 1 diastolic dysfunction; b. TEE 6/18: evidence of a very small atrial level right to left shunt by bubble study without visualization of ASD or PFO   Substance abuse (HCC)    Past use of cocaine    Tobacco abuse     Current Outpatient Medications:    albuterol (VENTOLIN HFA) 108 (90 Base) MCG/ACT inhaler, Inhale 1-2 puffs into the lungs every 6 (six) hours as needed for wheezing or shortness of breath., Disp: 6.7 g, Rfl: 0   apixaban (ELIQUIS) 5 MG TABS tablet, Take 1 tablet (5 mg total) by mouth 2 (two) times daily., Disp: 180 tablet, Rfl: 1   atorvastatin (LIPITOR) 40 MG tablet, Take 1 tablet (40 mg total) by  mouth daily., Disp: 90 tablet, Rfl: 3   azithromycin (ZITHROMAX) 250 MG tablet, Take first 2 tablets together, then 1 every day until finished., Disp: 6 tablet, Rfl: 0   meloxicam (MOBIC) 15 MG tablet, Take 1 tablet (15 mg total) by mouth daily., Disp: 30 tablet, Rfl: 3   methylPREDNISolone (MEDROL DOSEPAK) 4 MG TBPK tablet, 6 day dose pack - take as directed, Disp: 21 tablet, Rfl: 0   metoprolol tartrate (LOPRESSOR) 25 MG tablet, Take 25 mg by mouth 2 (two) times daily. Unsure of dose, Disp: , Rfl:    tiZANidine (ZANAFLEX) 4 MG tablet, Take 1 tablet (4 mg total) by mouth every 8 (eight) hours as needed for muscle spasms. Do not take with alcohol or while driving or operating heavy machinery.  May cause drowsiness., Disp: 30 tablet, Rfl: 0  Social History   Tobacco Use  Smoking Status Every Day   Current packs/day: 10.00   Types: Cigarettes  Smokeless Tobacco Former   Types: Snuff  Tobacco Comments   trying to quit; going to start using patches;     No Known Allergies Objective:  There were no vitals filed for this visit. There is no height or weight on file to calculate BMI. Constitutional Well developed. Well  nourished.  Vascular Dorsalis pedis pulses palpable bilaterally. Posterior tibial pulses palpable bilaterally. Capillary refill normal to all digits.  No cyanosis or clubbing noted. Pedal hair growth normal.  Neurologic Normal speech. Oriented to person, place, and time. Epicritic sensation to light touch grossly present bilaterally.  Dermatologic Nails well groomed and normal in appearance. No open wounds. No skin lesions.  Orthopedic: Pain on the course of the posterior tibial tendon severe pes planovalgus deformity noted.  Pain mostly at the insertion.  No pain at the ATFL ligament peroneal tendon Achilles tendon   Radiographs: MRI was reviewed by me: Does appear to show some tendinosis of the tendon like possible interstitial tearing.  Will await final radiologist  read Assessment:  No diagnosis found. Plan:  Patient was evaluated and treated and all questions answered.  Left posterior tibial tendinitis - All questions and concerns were discussed with the patient in extensive detail - Meloxicam was refilled - MRI was briefly reviewed with the patient which does show some tendinosis with possible interstitial tearing based on my read.  Awaiting final radiologist read.  I called radiologist to perform stat read - She will see Dr. Lara Plants back as patient would like to follow-up with Dr. Lara Plants for further management - Continue care with as needed

## 2023-07-26 NOTE — Telephone Encounter (Signed)
 mess says call can't be completed at this time* no option to leave a mess on Vmail. Pt had called me and left me a mess on vmail to call her at [phone]- ph# on file

## 2023-07-29 NOTE — Telephone Encounter (Signed)
 Lft mess for pt to call me bk on her vmail. I need to know if she is working now or does she want to be written out till after her next appt 08/10/23 with Dr. Lara Plants.I can fax or she can p/u at office.

## 2023-07-30 NOTE — Telephone Encounter (Signed)
 pt lft mess on my vmail. I called and lft mess on her vmail to call me so we can finish her forms.She did say she can't be out of work, so will prob be intermittent and she could come get forms from office once done.

## 2023-08-02 DIAGNOSIS — Z0271 Encounter for disability determination: Secondary | ICD-10-CM

## 2023-08-02 NOTE — Telephone Encounter (Signed)
 Faxed forms/notes to [employer] [phone]. Patient came in the office to sign [employer] forms and she will going to email [employer] forms and dates she has been out of work. She is wanting intermittent leave. Next appt is in May.

## 2023-08-05 ENCOUNTER — Ambulatory Visit (INDEPENDENT_AMBULATORY_CARE_PROVIDER_SITE_OTHER): Payer: MEDICAID | Admitting: Nurse Practitioner

## 2023-08-05 ENCOUNTER — Encounter: Payer: Self-pay | Admitting: Nurse Practitioner

## 2023-08-05 VITALS — BP 142/90 | HR 75 | Temp 98.5°F | Ht 65.0 in | Wt 228.4 lb

## 2023-08-05 DIAGNOSIS — F1911 Other psychoactive substance abuse, in remission: Secondary | ICD-10-CM

## 2023-08-05 DIAGNOSIS — Z7689 Persons encountering health services in other specified circumstances: Secondary | ICD-10-CM

## 2023-08-05 DIAGNOSIS — I480 Paroxysmal atrial fibrillation: Secondary | ICD-10-CM

## 2023-08-05 DIAGNOSIS — I119 Hypertensive heart disease without heart failure: Secondary | ICD-10-CM | POA: Diagnosis not present

## 2023-08-05 DIAGNOSIS — Z8673 Personal history of transient ischemic attack (TIA), and cerebral infarction without residual deficits: Secondary | ICD-10-CM

## 2023-08-05 DIAGNOSIS — R2241 Localized swelling, mass and lump, right lower limb: Secondary | ICD-10-CM

## 2023-08-05 DIAGNOSIS — I2510 Atherosclerotic heart disease of native coronary artery without angina pectoris: Secondary | ICD-10-CM | POA: Diagnosis not present

## 2023-08-05 DIAGNOSIS — Z1211 Encounter for screening for malignant neoplasm of colon: Secondary | ICD-10-CM

## 2023-08-05 DIAGNOSIS — I700 Atherosclerosis of aorta: Secondary | ICD-10-CM | POA: Diagnosis not present

## 2023-08-05 DIAGNOSIS — I10 Essential (primary) hypertension: Secondary | ICD-10-CM

## 2023-08-05 DIAGNOSIS — M542 Cervicalgia: Secondary | ICD-10-CM

## 2023-08-05 DIAGNOSIS — Z2821 Immunization not carried out because of patient refusal: Secondary | ICD-10-CM

## 2023-08-05 DIAGNOSIS — Z1231 Encounter for screening mammogram for malignant neoplasm of breast: Secondary | ICD-10-CM

## 2023-08-05 DIAGNOSIS — E785 Hyperlipidemia, unspecified: Secondary | ICD-10-CM

## 2023-08-05 DIAGNOSIS — E039 Hypothyroidism, unspecified: Secondary | ICD-10-CM

## 2023-08-05 MED ORDER — APIXABAN 5 MG PO TABS: 5.0000 mg | ORAL_TABLET | Freq: Two times a day (BID) | ORAL | 1 refills | Status: DC

## 2023-08-05 MED ORDER — ATORVASTATIN CALCIUM 40 MG PO TABS: 40.0000 mg | ORAL_TABLET | Freq: Every day | ORAL | 3 refills | Status: DC

## 2023-08-05 MED ORDER — METOPROLOL TARTRATE 25 MG PO TABS: 25.0000 mg | ORAL_TABLET | Freq: Two times a day (BID) | ORAL | 1 refills | Status: DC

## 2023-08-05 NOTE — Progress Notes (Signed)
 Del Favia, CMA,acting as a Neurosurgeon for Margaret Epley, [name],have documented all relevant documentation on the behalf of Margaret Epley, FNP,as directed by  Margaret Epley, FNP while in the presence of Margaret Epley, [name]  Subjective:  Patient ID: Margaret [name] , female    DOB: April 06, 1971 , 53 y.o.   MRN: [id number]  Chief Complaint  Patient presents with   Establish Care    Patient presents today to establish care, Patient reports non compliance with medication. Patient denies any chest pain, SOB, or headaches. Patient has no concerns today.    Hypertension    Patient hasn't had her blood pressure medication in about a month.   Headache    Patient reports having on and off headaches for about a month.   Neck Pain    Patient reports she has also been having neck pain for about 2 weeks she thought it was from her stroke about 8 years ago.    Mass    Patient reports a knot on her right leg that has been there for a few years. Patient reports she would like to make sure it isn't a clot.     HPI  She is here with her daughter Margaret [name]  She had been seen at [facility] a few months  She is working as a Lawyer 1, she is divorced.  She has 2 children - 2 daughters both healthy  She has been off of her medications after she moved the color of the pill was a different color. She relocated about 2 months ago. She does not see a cardiologist that she sees regularly. She has taken wellbutrin  in the past to help with smoking and her mood a few years ago.   She was taking levothyroxine  up to one month ago.  She is in the process of getting a short term disability claim in her ankle. She is seeing an orthopedic for her left foot pain.      Past Medical History:  Diagnosis Date   Anxiety    Arthritis    Asthma    controlled   CVA (cerebral vascular accident) (HCC)    a. 09/2016   Depression    Dysrhythmia    Headache    Hypertension    Hypothyroidism    Morbid obesity (HCC)    PAF (paroxysmal  atrial fibrillation) (HCC) 09/2016   a. 30-day event monitor 6/18: predominant rhythm of sinus with isolated PACs and PVCs.  Two narrow complex tachycardia episodes concerning for A. Fib +/-atrial flutter or SVT.  No prolonged pauses; b. CHADS2VASc = 3 (stroke x 2, female)--> Eliquis .   PFO (patent foramen ovale)    a. TTE 6/18: EF of 60 to 65%, normal wall motion, grade 1 diastolic dysfunction; b. TEE 6/18: evidence of a very small atrial level right to left shunt by bubble study without visualization of ASD or PFO   Substance abuse (HCC)    Past use of cocaine    Tobacco abuse      Family History  Problem Relation Age of Onset   Stroke Father    Hypertension Daughter    Migraines Daughter    Cervical cancer Maternal Grandmother    Heart disease Maternal Grandfather    Stroke Maternal Grandfather      Current Outpatient Medications:    apixaban  (ELIQUIS ) 5 MG TABS tablet, Take 1 tablet (5 mg total) by mouth 2 (two) times daily., Disp: 180 tablet, Rfl: 1   atorvastatin  (LIPITOR) 40  MG tablet, Take 1 tablet (40 mg total) by mouth daily., Disp: 90 tablet, Rfl: 3   levothyroxine  (SYNTHROID ) 25 MCG tablet, Take 1 tablet (25 mcg total) by mouth daily before breakfast., Disp: 90 tablet, Rfl: 1   metoprolol  tartrate (LOPRESSOR ) 25 MG tablet, Take 1 tablet (25 mg total) by mouth 2 (two) times daily. Unsure of dose, Disp: 90 tablet, Rfl: 1   No Known Allergies   Review of Systems  Constitutional: Negative.  Negative for activity change and fatigue.  Eyes:  Negative for visual disturbance.  Respiratory: Negative.  Negative for choking, shortness of breath and wheezing.   Cardiovascular: Negative.  Negative for chest pain, palpitations and leg swelling.  Gastrointestinal: Negative.   Endocrine: Negative.  Negative for polydipsia, polyphagia and polyuria.  Musculoskeletal: Negative.   Skin: Negative.   Neurological:  Negative for dizziness, weakness and headaches.  Psychiatric/Behavioral:   Negative for confusion. The patient is not nervous/anxious.      Today's Vitals   08/05/23 1429 08/05/23 1455  BP: (!) 140/90 (!) 142/90  Pulse: 75   Temp: 98.5 F (36.9 C)   TempSrc: Oral   Weight: 228 lb 6.4 oz (103.6 kg)   Height: 5\' 5"  (1.651 m)   PainSc: 8    PainLoc: Neck    Body mass index is 38.01 kg/m.  Wt Readings from Last 3 Encounters:  08/05/23 228 lb 6.4 oz (103.6 kg)  05/12/23 240 lb (108.9 kg)  03/03/23 240 lb (108.9 kg)    Objective:  Physical Exam Vitals and nursing note reviewed.  Constitutional:      Appearance: She is well-developed.  HENT:     Head: Normocephalic and atraumatic.  Cardiovascular:     Rate and Rhythm: Normal rate and regular rhythm.     Pulses: Normal pulses.     Heart sounds: Normal heart sounds. No murmur heard. Pulmonary:     Effort: Pulmonary effort is normal. No respiratory distress.     Breath sounds: Normal breath sounds. No wheezing.  Musculoskeletal:        General: Normal range of motion.  Skin:    General: Skin is warm and dry.     Capillary Refill: Capillary refill takes less than 2 seconds.  Neurological:     General: No focal deficit present.     Mental Status: She is alert and oriented to person, place, and time.     Cranial Nerves: No cranial nerve deficit.  Psychiatric:        Mood and Affect: Mood normal.      Assessment And Plan:  Essential hypertension Assessment & Plan: Blood pressure is poorly controlled at this time due to not taking her medications.   Orders: -     Metoprolol  Tartrate; Take 1 tablet (25 mg total) by mouth 2 (two) times daily. Unsure of dose  Dispense: 90 tablet; Refill: 1  Hypothyroidism, unspecified type Assessment & Plan: She has not been taking her levothyroxine  for at least one month. Will recheck levels today.  Orders: -     TSH + free T4  Aortic atherosclerosis (HCC) Assessment & Plan: Had been taking a statin, but has not taken them in the last month.   Orders: -      Atorvastatin  Calcium ; Take 1 tablet (40 mg total) by mouth daily.  Dispense: 90 tablet; Refill: 3  Hyperlipidemia LDL goal <70 Assessment & Plan: Will check lipid panel.   Orders: -     Atorvastatin  Calcium ; Take 1 tablet (  40 mg total) by mouth daily.  Dispense: 90 tablet; Refill: 3  Mass of right lower extremity Assessment & Plan: Semi soft mass to right lower extremity, least likely is a DVT as it can be felt. Concerned may be lipoma vs cyst.   Orders: -     US  SOFT TISSUE LOWER EXTREMITY LIMITED RIGHT (NON-VASCULAR); Future  Cervicalgia Assessment & Plan: This has been ongoing will refer to orthopedics for further evaluation. Has pain with range of motion with rotation to the left.   Orders: -     Ambulatory referral to Orthopedic Surgery  Paroxysmal atrial fibrillation [facility]) Assessment & Plan: She is on eliquis  however has been out of it, she is to f/u with Cardiology as well.   Orders: -     Apixaban ; Take 1 tablet (5 mg total) by mouth 2 (two) times daily.  Dispense: 180 tablet; Refill: 1  Encounter for screening mammogram for malignant neoplasm of breast Assessment & Plan: Pt instructed on Self Breast Exam.According to ACOG guidelines Women aged 42 and older are recommended to get an annual mammogram.  Breast center called her during the office visit to make an appt.    Orders: -     3D Screening Mammogram, Left and Right; Future  Coronary artery disease involving native coronary artery of native heart without angina pectoris Assessment & Plan: She is encouraged to continue her statin.   Orders: -     CMP14+EGFR -     Lipid panel  Screening for colon cancer Assessment & Plan: According to USPTF Colorectal cancer Screening guidelines. Cologuard is recommended every 3 years, starting at age 50 years. Order for cologuard sent   Orders: -     Ambulatory referral to Gastroenterology  COVID-19 vaccination declined Assessment & Plan: Declines covid 19  vaccine. Discussed risk of covid 47 and if she changes her mind about the vaccine to call the office. Education has been provided regarding the importance of this vaccine but patient still declined. Advised may receive this vaccine at local pharmacy or Health Dept.or vaccine clinic. Aware to provide a copy of the vaccination record if obtained from local pharmacy or Health Dept.  Encouraged to take multivitamin, vitamin d, vitamin c and zinc to increase immune system. Aware can call office if would like to have vaccine here at office. Verbalized acceptance and understanding.    Herpes zoster vaccination declined Assessment & Plan: Declines shingrix, educated on disease process and is aware if he changes his mind to notify office    History of stroke Assessment & Plan: She had a stroke in 2018, she is currently not taking her eliquis  in the last month. Some of her medications she was concerned about them looking different.    We had a conversation about that some pharmacies have the same medication but from a different manufacturer. Will get her restarted on her medications.   Return in about 4 months (around 12/06/2023) for phy when able..  Patient was given opportunity to ask questions. Patient verbalized understanding of the plan and was able to repeat key elements of the plan. All questions were answered to their satisfaction.    Inge Mangle, FNP, have reviewed all documentation for this visit. The documentation on 08/05/23 for the exam, diagnosis, procedures, and orders are all accurate and complete.   IF YOU HAVE BEEN REFERRED TO A SPECIALIST, IT MAY TAKE 1-2 WEEKS TO SCHEDULE/PROCESS THE REFERRAL. IF YOU HAVE NOT HEARD FROM US /SPECIALIST IN TWO WEEKS, PLEASE  GIVE US  A CALL AT [phone].

## 2023-08-06 ENCOUNTER — Other Ambulatory Visit: Payer: MEDICAID

## 2023-08-06 LAB — CMP14+EGFR
ALT: 11 IU/L (ref 0–32)
AST: 14 IU/L (ref 0–40)
Albumin: 4.1 g/dL (ref 3.8–4.9)
Alkaline Phosphatase: 108 IU/L (ref 44–121)
BUN/Creatinine Ratio: 8 — ABNORMAL LOW (ref 9–23)
BUN: 6 mg/dL (ref 6–24)
Bilirubin Total: 0.3 mg/dL (ref 0.0–1.2)
CO2: 24 mmol/L (ref 20–29)
Calcium: 9.4 mg/dL (ref 8.7–10.2)
Chloride: 101 mmol/L (ref 96–106)
Creatinine, Ser: 0.78 mg/dL (ref 0.57–1.00)
Globulin, Total: 2.9 g/dL (ref 1.5–4.5)
Glucose: 89 mg/dL (ref 70–99)
Potassium: 4 mmol/L (ref 3.5–5.2)
Sodium: 141 mmol/L (ref 134–144)
Total Protein: 7 g/dL (ref 6.0–8.5)
eGFR: 92 mL/min/{1.73_m2} (ref >59)

## 2023-08-06 LAB — LIPID PANEL
Chol/HDL Ratio: 5.2 ratio — ABNORMAL HIGH (ref 0.0–4.4)
Cholesterol, Total: 219 mg/dL — ABNORMAL HIGH (ref 100–199)
HDL: 42 mg/dL (ref >39)
LDL Chol Calc (NIH): 149 mg/dL — ABNORMAL HIGH (ref 0–99)
Triglycerides: 156 mg/dL — ABNORMAL HIGH (ref 0–149)
VLDL Cholesterol Cal: 28 mg/dL (ref 5–40)

## 2023-08-06 LAB — TSH+FREE T4
Free T4: 0.7 ng/dL — ABNORMAL LOW (ref 0.82–1.77)
TSH: 16.9 u[IU]/mL — ABNORMAL HIGH (ref 0.450–4.500)

## 2023-08-09 ENCOUNTER — Telehealth: Payer: Self-pay

## 2023-08-09 ENCOUNTER — Encounter: Payer: Self-pay | Admitting: Nurse Practitioner

## 2023-08-09 ENCOUNTER — Ambulatory Visit: Payer: Self-pay | Admitting: Nurse Practitioner

## 2023-08-09 ENCOUNTER — Other Ambulatory Visit: Payer: Self-pay | Admitting: Nurse Practitioner

## 2023-08-09 ENCOUNTER — Ambulatory Visit
Admission: RE | Admit: 2023-08-09 | Discharge: 2023-08-09 | Disposition: A | Discharge status: Home / Self Care | Payer: MEDICAID | Source: Ambulatory Visit | Attending: Nurse Practitioner | Admitting: Nurse Practitioner

## 2023-08-09 ENCOUNTER — Other Ambulatory Visit: Payer: MEDICAID

## 2023-08-09 DIAGNOSIS — Z1231 Encounter for screening mammogram for malignant neoplasm of breast: Secondary | ICD-10-CM

## 2023-08-09 DIAGNOSIS — E039 Hypothyroidism, unspecified: Secondary | ICD-10-CM

## 2023-08-09 MED ORDER — LEVOTHYROXINE SODIUM 25 MCG PO TABS
25.0000 ug | ORAL_TABLET | Freq: Every day | ORAL | 1 refills | Status: DC
Start: 2023-08-09 — End: 2023-11-30

## 2023-08-09 NOTE — Telephone Encounter (Signed)
 Called patient to see what her appt today was about. Patient was seen 08/05/2023

## 2023-08-10 ENCOUNTER — Ambulatory Visit (INDEPENDENT_AMBULATORY_CARE_PROVIDER_SITE_OTHER): Payer: MEDICAID | Admitting: Podiatry

## 2023-08-10 DIAGNOSIS — M2142 Flat foot [pes planus] (acquired), left foot: Secondary | ICD-10-CM

## 2023-08-10 DIAGNOSIS — M66872 Spontaneous rupture of other tendons, left ankle and foot: Secondary | ICD-10-CM

## 2023-08-10 NOTE — Progress Notes (Signed)
 She presents today for follow-up of her MRI of her left foot.  She states that still having pain is 8 out of 10 and she has been wearing her cam walker and it really does not help at all.  Objective: Vital signs are stable she is alert and oriented x 3.  Pulses are palpable.  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  Pain on palpation and inversion against pressure of the posterior tibial tendon of her left foot.  Consistent with MRI findings pain is palpable along its length from the medial malleolus extending to the navicular tuberosity.  MRI does demonstrate tearing at its insertion with a flap of tissue at that point.  Assessment: Posterior tibial tendon tear left foot.  Plan: Consented her today for Kidner procedure and a posterior tibial tendon repair left foot with cast application.  She understands this is amenable to it we did discuss the possible postop complications which may include but are not limited to postop pain bleeding swelling infection recurrence need further surgery overcorrection under correction loss of digit loss limb loss of life.

## 2023-08-10 NOTE — Telephone Encounter (Signed)
 lft mess on pt's vmail to bring in the forms she was suppose to email me.See prev notes. I did adv the [employer] forms were sent but I need the others she was suppose to email.

## 2023-08-11 DIAGNOSIS — Z0271 Encounter for disability determination: Secondary | ICD-10-CM

## 2023-08-11 NOTE — Telephone Encounter (Signed)
 lft mess on vmail of pt. Adv to call back to adv what leave she wants"continuous" or "intermittent". See notes from 08/10/23 visit.

## 2023-08-11 NOTE — Telephone Encounter (Signed)
 Pt called back and adv will do intermittent leave until her surgery is scheduled. She is still in alot of pain. Dates 06/30/23-10/04/23* 2-3days per week\8-10 hours per episode. She understands once surgery is scheduled her leave will turn to continuous

## 2023-08-12 ENCOUNTER — Encounter: Payer: Self-pay | Admitting: Podiatry

## 2023-08-12 ENCOUNTER — Ambulatory Visit: Admission: RE | Admit: 2023-08-12 | Payer: MEDICAID | Source: Ambulatory Visit

## 2023-08-12 ENCOUNTER — Telehealth: Payer: Self-pay | Admitting: Podiatry

## 2023-08-12 NOTE — Telephone Encounter (Signed)
 Pt left message yesterday 5/7 at 454pm stating she was wanting to schedule her surgery.  She called back this morning and I was in the middle of a urgent matter and Kendel told her I would call her back. Pt stated why it takes a few days to get the surgeries scheduled and pt hung up.   I returned call and left message for pt to call me directly. I did explain in the message that we are having to get surgical clearance due to her being on the eliquis .

## 2023-08-12 NOTE — Telephone Encounter (Signed)
 Pt want to change from intermediate disability to short term disability.She will be leaving paperwork at front desk.

## 2023-08-13 NOTE — Telephone Encounter (Signed)
 lft mss on vmail for pt to let me know if she will pick up or I email back to her so she can fill out her part. [employer] leave now 06/30/23-11/04/23. No date for surgery yet.I will email her as well.

## 2023-08-15 DIAGNOSIS — R2241 Localized swelling, mass and lump, right lower limb: Secondary | ICD-10-CM | POA: Insufficient documentation

## 2023-08-15 DIAGNOSIS — Z2821 Immunization not carried out because of patient refusal: Secondary | ICD-10-CM | POA: Insufficient documentation

## 2023-08-15 DIAGNOSIS — E785 Hyperlipidemia, unspecified: Secondary | ICD-10-CM | POA: Insufficient documentation

## 2023-08-15 DIAGNOSIS — M542 Cervicalgia: Secondary | ICD-10-CM | POA: Insufficient documentation

## 2023-08-15 DIAGNOSIS — Z1231 Encounter for screening mammogram for malignant neoplasm of breast: Secondary | ICD-10-CM | POA: Insufficient documentation

## 2023-08-15 DIAGNOSIS — Z1211 Encounter for screening for malignant neoplasm of colon: Secondary | ICD-10-CM | POA: Insufficient documentation

## 2023-08-15 NOTE — Assessment & Plan Note (Signed)
 Will check lipid panel

## 2023-08-15 NOTE — Assessment & Plan Note (Signed)
 Had been taking a statin, but has not taken them in the last month.

## 2023-08-15 NOTE — Assessment & Plan Note (Signed)
 Blood pressure is poorly controlled at this time due to not taking her medications.

## 2023-08-15 NOTE — Assessment & Plan Note (Signed)
 She is on eliquis  however has been out of it, she is to f/u with Cardiology as well.

## 2023-08-15 NOTE — Assessment & Plan Note (Signed)
 Semi soft mass to right lower extremity, least likely is a DVT as it can be felt. Concerned may be lipoma vs cyst.

## 2023-08-15 NOTE — Assessment & Plan Note (Signed)
 According to USPTF Colorectal cancer Screening guidelines. Cologuard is recommended every 3 years, starting at age 52 years. Order for cologuard sent

## 2023-08-15 NOTE — Assessment & Plan Note (Signed)

## 2023-08-15 NOTE — Assessment & Plan Note (Signed)
 This has been ongoing will refer to orthopedics for further evaluation. Has pain with range of motion with rotation to the left.

## 2023-08-15 NOTE — Assessment & Plan Note (Signed)
 She has not been taking her levothyroxine  for at least one month. Will recheck levels today.

## 2023-08-15 NOTE — Assessment & Plan Note (Signed)
 Declines shingrix, educated on disease process and is aware if he changes his mind to notify office

## 2023-08-15 NOTE — Assessment & Plan Note (Addendum)
 Pt instructed on Self Breast Exam.According to ACOG guidelines Women aged 52 and older are recommended to get an annual mammogram.  Breast center called her during the office visit to make an appt.

## 2023-08-15 NOTE — Assessment & Plan Note (Signed)
 She had a stroke in 2018, she is currently not taking her eliquis  in the last month. Some of her medications she was concerned about them looking different.

## 2023-08-15 NOTE — Assessment & Plan Note (Signed)
 She is encouraged to continue her statin

## 2023-08-17 ENCOUNTER — Ambulatory Visit: Payer: MEDICAID

## 2023-08-17 ENCOUNTER — Other Ambulatory Visit: Payer: Self-pay | Admitting: Nurse Practitioner

## 2023-08-17 ENCOUNTER — Telehealth: Payer: Self-pay

## 2023-08-17 DIAGNOSIS — Z01818 Encounter for other preprocedural examination: Secondary | ICD-10-CM

## 2023-08-17 DIAGNOSIS — Z7901 Long term (current) use of anticoagulants: Secondary | ICD-10-CM

## 2023-08-17 NOTE — Telephone Encounter (Signed)
 pt lft mess on my vmail. I called her back and advised her to fill out her portion of [employer] forms and her employer do their part. Then she can bring back to me and I wll fax all together back to [employer].for her.

## 2023-08-17 NOTE — Telephone Encounter (Signed)
 Called patient to see if she could come in for a lab visit (CBC AND PT) for surgical clearance. LVM for patient to call back and schedule Lab visit.  Labs in future.

## 2023-08-18 ENCOUNTER — Other Ambulatory Visit: Payer: MEDICAID

## 2023-08-20 ENCOUNTER — Inpatient Hospital Stay: Admission: RE | Admit: 2023-08-20 | Payer: MEDICAID | Source: Ambulatory Visit

## 2023-08-20 ENCOUNTER — Other Ambulatory Visit: Payer: Self-pay

## 2023-08-23 ENCOUNTER — Other Ambulatory Visit: Payer: MEDICAID

## 2023-08-23 ENCOUNTER — Other Ambulatory Visit: Payer: Self-pay

## 2023-08-23 DIAGNOSIS — Z01818 Encounter for other preprocedural examination: Secondary | ICD-10-CM

## 2023-08-23 LAB — CBC WITH DIFFERENTIAL/PLATELET
Basophils Absolute: 0.1 10*3/uL (ref 0.0–0.2)
Basos: 0 %
EOS (ABSOLUTE): 0.1 10*3/uL (ref 0.0–0.4)
Eos: 1 %
Hematocrit: 35.2 % (ref 34.0–46.6)
Hemoglobin: 11.2 g/dL (ref 11.1–15.9)
Immature Grans (Abs): 0 10*3/uL (ref 0.0–0.1)
Immature Granulocytes: 0 %
Lymphocytes Absolute: 4 10*3/uL — ABNORMAL HIGH (ref 0.7–3.1)
Lymphs: 34 %
MCH: 25.9 pg — ABNORMAL LOW (ref 26.6–33.0)
MCHC: 31.8 g/dL (ref 31.5–35.7)
MCV: 82 fL (ref 79–97)
Monocytes Absolute: 0.7 10*3/uL (ref 0.1–0.9)
Monocytes: 6 %
Neutrophils Absolute: 6.8 10*3/uL (ref 1.4–7.0)
Neutrophils: 59 %
Platelets: 417 10*3/uL (ref 150–450)
RBC: 4.32 x10E6/uL (ref 3.77–5.28)
RDW: 16.3 % — ABNORMAL HIGH (ref 11.7–15.4)
WBC: 11.7 10*3/uL — ABNORMAL HIGH (ref 3.4–10.8)

## 2023-08-24 ENCOUNTER — Telehealth: Payer: Self-pay | Admitting: Podiatry

## 2023-08-24 NOTE — Telephone Encounter (Signed)
 DOS: 09/03/23  (LT)  REPAIR TENDON-[id number] (LT) [facility] ADVANCED TENDON -[id number]     EFFECTIVE DATE :  08/17/2023      PER ERICA R OF [employer] NO PRIOR AUTH IS REQ FOR CPT CODES 40981,[id number]  REF 3 [id number]

## 2023-08-25 ENCOUNTER — Ambulatory Visit
Admission: RE | Admit: 2023-08-25 | Discharge: 2023-08-25 | Disposition: A | Discharge status: Home / Self Care | Payer: MEDICAID | Source: Ambulatory Visit | Attending: Nurse Practitioner | Admitting: Nurse Practitioner

## 2023-08-25 ENCOUNTER — Other Ambulatory Visit: Payer: MEDICAID

## 2023-08-25 DIAGNOSIS — R2241 Localized swelling, mass and lump, right lower limb: Secondary | ICD-10-CM

## 2023-08-25 NOTE — Telephone Encounter (Signed)
 Emailed pt to see if she wants me to go ahead and fax [employer] the forms and she sends her part later? See prv notes. Her date of surgery is 09/03/23. She should be OOW 06/30/23-11/04/23 approx

## 2023-08-31 ENCOUNTER — Telehealth: Payer: Self-pay | Admitting: Internal Medicine

## 2023-08-31 ENCOUNTER — Telehealth: Payer: Self-pay | Admitting: Podiatry

## 2023-08-31 NOTE — Telephone Encounter (Signed)
 Left voicemail message to call back

## 2023-08-31 NOTE — Telephone Encounter (Signed)
 Pt called to ask when she should stop her Eloquis before surgery . Advised pt to call her primary care dr. Pt cursed and yelled about "playing with her life" . I reiterated my first request for her to call her dr and  to seek medical advice from her primary care dr or whomever prescribed her prescription

## 2023-08-31 NOTE — Telephone Encounter (Signed)
 Pt requesting a c/b in regards to her upcoming ankle surgery. Please advise

## 2023-09-01 ENCOUNTER — Telehealth: Payer: Self-pay | Admitting: Podiatry

## 2023-09-01 ENCOUNTER — Telehealth: Payer: Self-pay

## 2023-09-01 ENCOUNTER — Other Ambulatory Visit: Payer: Self-pay | Admitting: Nurse Practitioner

## 2023-09-01 ENCOUNTER — Other Ambulatory Visit: Payer: Self-pay | Admitting: Podiatry

## 2023-09-01 ENCOUNTER — Ambulatory Visit: Payer: Self-pay | Admitting: Nurse Practitioner

## 2023-09-01 ENCOUNTER — Ambulatory Visit: Payer: MEDICAID | Admitting: Physical Medicine and Rehabilitation

## 2023-09-01 MED ORDER — OXYCODONE-ACETAMINOPHEN 10-325 MG PO TABS: 1.0000 | ORAL_TABLET | Freq: Three times a day (TID) | ORAL | 0 refills | Status: AC | PRN

## 2023-09-01 MED ORDER — ONDANSETRON HCL 4 MG PO TABS: 4.0000 mg | ORAL_TABLET | Freq: Three times a day (TID) | ORAL | 0 refills | Status: DC | PRN

## 2023-09-01 MED ORDER — CEPHALEXIN 500 MG PO CAPS: 500.0000 mg | ORAL_CAPSULE | Freq: Three times a day (TID) | ORAL | 0 refills | Status: DC

## 2023-09-01 NOTE — Telephone Encounter (Signed)
 Copied from CRM [id number]. Topic: Clinical - Medical Advice >> Sep 01, 2023  9:26 AM El Gravely T wrote: Reason for CRM: Dawn with [facility] calling to check status of faxed surgical clearance on behalf of patient. Form was faxed on 08/12/23, refaxed again on today.  Surgery is scheduled for Friday 09/03/23.  Please follow up with call as soon as possible to [facility] [place] office at [phone].  Message routed to provider- she has came and got her labs.

## 2023-09-01 NOTE — Telephone Encounter (Signed)
 Faxed [employer] [phone] STD forms/notes for pt. She never returned her portion of forms to me to send for her. DOS is 09/03/23 so I went ahead and faxed Dr portion for pt.

## 2023-09-01 NOTE — Telephone Encounter (Signed)
 Called and spoke to [place] at pcp office seeing if they could get the surgical clearance form sent to me. Originally faxed 5/8 and refaxed today

## 2023-09-03 DIAGNOSIS — M66872 Spontaneous rupture of other tendons, left ankle and foot: Secondary | ICD-10-CM | POA: Diagnosis not present

## 2023-09-06 ENCOUNTER — Encounter: Payer: Self-pay | Admitting: Podiatry

## 2023-09-06 ENCOUNTER — Telehealth: Payer: Self-pay | Admitting: Podiatry

## 2023-09-06 NOTE — Telephone Encounter (Signed)
 Patient wants to know

## 2023-09-06 NOTE — Telephone Encounter (Signed)
 emailed pt a letter including her DOS 09/03/23 and her leave dates per her request.

## 2023-09-07 ENCOUNTER — Encounter: Payer: Self-pay | Admitting: Nurse Practitioner

## 2023-09-08 ENCOUNTER — Ambulatory Visit: Payer: Self-pay | Admitting: Nurse Practitioner

## 2023-09-09 ENCOUNTER — Encounter: Payer: Self-pay | Admitting: Podiatry

## 2023-09-09 ENCOUNTER — Ambulatory Visit: Payer: MEDICAID | Admitting: Podiatry

## 2023-09-09 ENCOUNTER — Ambulatory Visit (INDEPENDENT_AMBULATORY_CARE_PROVIDER_SITE_OTHER): Payer: MEDICAID

## 2023-09-09 DIAGNOSIS — Z9889 Other specified postprocedural states: Secondary | ICD-10-CM

## 2023-09-09 DIAGNOSIS — M66872 Spontaneous rupture of other tendons, left ankle and foot: Secondary | ICD-10-CM | POA: Diagnosis not present

## 2023-09-12 ENCOUNTER — Other Ambulatory Visit: Payer: Self-pay | Admitting: Nurse Practitioner

## 2023-09-12 DIAGNOSIS — D1723 Benign lipomatous neoplasm of skin and subcutaneous tissue of right leg: Secondary | ICD-10-CM

## 2023-09-13 ENCOUNTER — Ambulatory Visit: Payer: MEDICAID | Admitting: Internal Medicine

## 2023-09-13 NOTE — Progress Notes (Signed)
 She presents today for her first postop visit date of surgery 09/03/2023.  Status post Kidner tendon advancement with repair of her posterior tibial tendon.  Objective: Presents today nonambulatory.  Cast for the most part is dry clean and intact.  Good sensation to toes cast is not tight proximally.  She has good range of motion of her toes.  Radiographs taken today AP and lateral demonstrate intact anchor to the navicular.  With resection of the medial tuberosity.  Assessment well-healing surgical foot left date of surgery 09/03/2023.  Plan: Follow-up with her in 2 weeks continue current therapies.

## 2023-09-21 ENCOUNTER — Other Ambulatory Visit: Payer: Self-pay | Admitting: Podiatry

## 2023-09-21 ENCOUNTER — Telehealth: Payer: Self-pay | Admitting: Podiatry

## 2023-09-21 MED ORDER — OXYCODONE-ACETAMINOPHEN 10-325 MG PO TABS: 1.0000 | ORAL_TABLET | Freq: Three times a day (TID) | ORAL | 0 refills | Status: AC | PRN

## 2023-09-21 NOTE — Telephone Encounter (Signed)
 Patient was calling asking for some pain medication refill. Pt uses [facility].

## 2023-09-22 ENCOUNTER — Telehealth: Payer: Self-pay

## 2023-09-22 NOTE — Telephone Encounter (Signed)
-----   Message from Endicott C sent at 09/21/2023 11:46 AM EDT ----- Regarding: Medication Management Patient had surgery on 09/03/23 and has run out of pain medicine. She requests a refill. Preferred pharmacy is [facility]. [phone]

## 2023-09-22 NOTE — Telephone Encounter (Signed)
 PA request received for Oxycodone /Acetaminophen  10-325 mg tablet. PA submitted through CoverMyMeds and waiting on response.  Margaret [name]  (Key: [id number]) PA Case ID #: [id number]

## 2023-09-23 ENCOUNTER — Encounter: Payer: Self-pay | Admitting: Podiatry

## 2023-09-23 ENCOUNTER — Telehealth: Payer: Self-pay | Admitting: Podiatry

## 2023-09-23 ENCOUNTER — Ambulatory Visit: Payer: MEDICAID | Admitting: Podiatry

## 2023-09-23 DIAGNOSIS — Z9889 Other specified postprocedural states: Secondary | ICD-10-CM

## 2023-09-23 DIAGNOSIS — M66872 Spontaneous rupture of other tendons, left ankle and foot: Secondary | ICD-10-CM

## 2023-09-23 NOTE — Progress Notes (Unsigned)
 She presents today status post Kidner procedure with posterior tibial tendon repair left.  Denies fever chills nausea vomit muscle aches pains calf pain back pain chest pain shortness of breath.  Objective: Cast intact dry and clean was removed demonstrates dry sterile dressing intact dry and clean was removed demonstrates staples are intact minimal edema no erythema as well as drainage or odor she is got good range of motion.  Good sensation no open lesions or wounds no abrasions or skin breakdown.  Assessment: Well-healing surgical foot and ankle.  Plan: Discussed etiology pathology and surgical therapies at this point redress her today dressed a compressive dressing but replaced her cast she will continue nonweightbearing status for the next couple of weeks.  The cast will then come off should be placed in a cam boot Staples will be removed at that time and then partial weightbearing will be necessary for 2 weeks after that.

## 2023-09-23 NOTE — Telephone Encounter (Signed)
 The patient would like to reschedule her appointment currently set for 7/10 with Dr. Marvis Sluder. She prefers to see you, as you performed her surgery, and is asking if the appointment can be moved to 7/17, when you return.  Please advise, thank you

## 2023-09-23 NOTE — Telephone Encounter (Signed)
Thanks Dawn!

## 2023-09-27 ENCOUNTER — Encounter: Payer: Self-pay | Admitting: Podiatry

## 2023-09-27 ENCOUNTER — Encounter: Payer: Self-pay | Admitting: Gastroenterology

## 2023-09-28 DIAGNOSIS — Z0271 Encounter for disability determination: Secondary | ICD-10-CM

## 2023-09-28 NOTE — Telephone Encounter (Signed)
 lft mess on vmail of pt to adv form from landlord is ready for p[up and check if [employer] is going to send another form to my fax.

## 2023-10-05 ENCOUNTER — Ambulatory Visit: Payer: MEDICAID | Admitting: Nurse Practitioner

## 2023-10-05 NOTE — Progress Notes (Deleted)
 Margaret [name], CMA,acting as a Neurosurgeon for Margaret Ada, [name],have documented all relevant documentation on the behalf of Margaret Ada, FNP,as directed by  Margaret Ada, FNP while in the presence of Margaret Ada, [name]  Subjective:  Patient ID: Margaret [name] , female    DOB: [birth year] , 53 y.o.   MRN: [id number]  No chief complaint on file.   HPI  HPI   Past Medical History:  Diagnosis Date   Anxiety    Arthritis    Asthma    controlled   CVA (cerebral vascular accident) (HCC)    a. 09/2016   Depression    Dysrhythmia    Headache    Hypertension    Hypothyroidism    Morbid obesity (HCC)    PAF (paroxysmal atrial fibrillation) (HCC) 09/2016   a. 30-day event monitor 6/18: predominant rhythm of sinus with isolated PACs and PVCs.  Two narrow complex tachycardia episodes concerning for A. Fib +/-atrial flutter or SVT.  No prolonged pauses; b. CHADS2VASc = 3 (stroke x 2, female)--> Eliquis .   PFO (patent foramen ovale)    a. TTE 6/18: EF of 60 to 65%, normal wall motion, grade 1 diastolic dysfunction; b. TEE 6/18: evidence of a very small atrial level right to left shunt by bubble study without visualization of ASD or PFO   Substance abuse (HCC)    Past use of cocaine    Tobacco abuse      Family History  Problem Relation Age of Onset   Stroke Father    Hypertension Daughter    Migraines Daughter    Cervical cancer Maternal Grandmother    Heart disease Maternal Grandfather    Stroke Maternal Grandfather      Current Outpatient Medications:    apixaban  (ELIQUIS ) 5 MG TABS tablet, Take 1 tablet (5 mg total) by mouth 2 (two) times daily., Disp: 180 tablet, Rfl: 1   atorvastatin  (LIPITOR) 40 MG tablet, Take 1 tablet (40 mg total) by mouth daily., Disp: 90 tablet, Rfl: 3   cephALEXin  (KEFLEX ) 500 MG capsule, Take 1 capsule (500 mg total) by mouth 3 (three) times daily., Disp: 30 capsule, Rfl: 0   levothyroxine  (SYNTHROID ) 25 MCG tablet, Take 1 tablet (25 mcg total) by mouth  daily before breakfast., Disp: 90 tablet, Rfl: 1   metoprolol  tartrate (LOPRESSOR ) 25 MG tablet, Take 1 tablet (25 mg total) by mouth 2 (two) times daily. Unsure of dose, Disp: 90 tablet, Rfl: 1   ondansetron  (ZOFRAN ) 4 MG tablet, Take 1 tablet (4 mg total) by mouth every 8 (eight) hours as needed., Disp: 20 tablet, Rfl: 0   No Known Allergies   Review of Systems   There were no vitals filed for this visit. There is no height or weight on file to calculate BMI.  Wt Readings from Last 3 Encounters:  08/05/23 228 lb 6.4 oz (103.6 kg)  05/12/23 240 lb (108.9 kg)  03/03/23 240 lb (108.9 kg)    The ASCVD Risk score (Arnett DK, et al., 2019) failed to calculate for the following reasons:   Risk score cannot be calculated because patient has a medical history suggesting prior/existing ASCVD  Objective:  Physical Exam      Assessment And Plan:  There are no diagnoses linked to this encounter.  No follow-ups on file.  Patient was given opportunity to ask questions. Patient verbalized understanding of the plan and was able to repeat key elements of the plan. All questions were answered to their satisfaction.  Margaret Margaret Ada, FNP, have reviewed all documentation for this visit. The documentation on 10/05/23 for the exam, diagnosis, procedures, and orders are all accurate and complete.   IF YOU HAVE BEEN REFERRED TO A SPECIALIST, IT MAY TAKE 1-2 WEEKS TO SCHEDULE/PROCESS THE REFERRAL. IF YOU HAVE NOT HEARD FROM US /SPECIALIST IN TWO WEEKS, PLEASE GIVE US  A CALL AT [phone] X [phone].

## 2023-10-14 ENCOUNTER — Encounter: Payer: MEDICAID | Admitting: Podiatry

## 2023-10-21 ENCOUNTER — Ambulatory Visit (INDEPENDENT_AMBULATORY_CARE_PROVIDER_SITE_OTHER): Payer: MEDICAID | Admitting: Podiatry

## 2023-10-21 ENCOUNTER — Encounter: Payer: Self-pay | Admitting: Podiatry

## 2023-10-21 ENCOUNTER — Telehealth: Payer: Self-pay | Admitting: Podiatry

## 2023-10-21 DIAGNOSIS — Z9889 Other specified postprocedural states: Secondary | ICD-10-CM | POA: Diagnosis not present

## 2023-10-21 DIAGNOSIS — M66872 Spontaneous rupture of other tendons, left ankle and foot: Secondary | ICD-10-CM | POA: Diagnosis not present

## 2023-10-21 NOTE — Progress Notes (Signed)
 She presents today date of surgery 09/03/2023 with a left posterior tibial tendon repair and cast application.  She states it is still itchy and achy.  Denies fever chills nausea mobic  muscle aches pains calf pain back pain chest pain shortness of breath.  Objective: Cast is intact but dirty on the bottom it does not look bad.  Cast was removed Restall dressing intact Staples are intact removed today margins regrading well coapted.  She has good range of motion of the foot.  Assessment: Well-healing surgical foot.  Plan: Encouraged range of motion exercises allow her to start washing this tomorrow allow her to start putting a little bit of weight on the foot utilizing her crutches in cam boot.  She is not to walk without the cam boot.  We did dispense a plantar compression anklet.  We will have to extend her out of work by approximately another 6 weeks.  Follow-up with her in about 3 weeks.

## 2023-10-21 NOTE — Telephone Encounter (Signed)
 Per note from Ashley/notes extend RTW 6 weeks approx 12/13/23. Faxed to [employer] [phone]

## 2023-10-22 ENCOUNTER — Telehealth: Payer: Self-pay | Admitting: Podiatry

## 2023-10-22 NOTE — Telephone Encounter (Signed)
 pt lft mess. cld bk and she gave employer email to send letter of her extension to 12/13/23 [email or web address]

## 2023-10-25 ENCOUNTER — Telehealth: Payer: Self-pay | Admitting: Podiatry

## 2023-10-25 NOTE — Telephone Encounter (Signed)
 Patient is requesting a refill of oxyCODONE -acetaminophen  (PERCOCET) 10-325 MG tablet  to be sent to [facility] on [address].

## 2023-10-26 ENCOUNTER — Other Ambulatory Visit: Payer: Self-pay | Admitting: Podiatry

## 2023-10-26 MED ORDER — OXYCODONE-ACETAMINOPHEN 10-325 MG PO TABS: 1.0000 | ORAL_TABLET | Freq: Three times a day (TID) | ORAL | 0 refills | Status: AC | PRN

## 2023-10-27 NOTE — Telephone Encounter (Signed)
 pt lft mess oh my vmail and I called her back and lft mess. she wants copy of what was sent to [employer].I adv will leave at front desk for her to pick up

## 2023-11-05 DIAGNOSIS — Z0271 Encounter for disability determination: Secondary | ICD-10-CM

## 2023-11-05 NOTE — Telephone Encounter (Signed)
 Recd [employer] forms via email. Advised the pt complete and will fax to [phone]

## 2023-11-10 NOTE — Telephone Encounter (Signed)
 pt lft mess and I called her back. she cant open her email and [employer] says need form I already faxed. She will come get herself and get to them. she is coming in office to pick up today. provided a copy for her records as well

## 2023-11-15 ENCOUNTER — Ambulatory Visit (HOSPITAL_BASED_OUTPATIENT_CLINIC_OR_DEPARTMENT_OTHER): Payer: MEDICAID | Admitting: Family

## 2023-11-21 NOTE — Progress Notes (Deleted)
 Cardiology Office Note    Patient Name: Margaret [name] Date of Encounter: 11/21/2023  Primary Care Provider:  Georgina Speaks, FNP Primary Cardiologist:  None Primary Electrophysiologist: None   Past Medical History    Past Medical History:  Diagnosis Date   Anxiety    Arthritis    Asthma    controlled   CVA (cerebral vascular accident) (HCC)    a. 09/2016   Depression    Dysrhythmia    Headache    Hypertension    Hypothyroidism    Morbid obesity (HCC)    PAF (paroxysmal atrial fibrillation) (HCC) 09/2016   a. 30-day event monitor 6/18: predominant rhythm of sinus with isolated PACs and PVCs.  Two narrow complex tachycardia episodes concerning for A. Fib +/-atrial flutter or SVT.  No prolonged pauses; b. CHADS2VASc = 3 (stroke x 2, female)--> Eliquis .   PFO (patent foramen ovale)    a. TTE 6/18: EF of 60 to 65%, normal wall motion, grade 1 diastolic dysfunction; b. TEE 6/18: evidence of a very small atrial level right to left shunt by bubble study without visualization of ASD or PFO   Substance abuse (HCC)    Past use of cocaine    Tobacco abuse     History of Present Illness  Margaret [name]  is a 53 year old female with a PMH of  PAF complicated by CVA, patent foramen ovale, COVID in 05/2019, 05/2020 and 03/2021, HTN, HLD, hypothyroidism, asthma, intractable headache, tobacco use, and remote substance abuse with marijuana and cocaine who presents today for annual follow-up.   Ms. Ahlberg was last seen on 12/17/2022 by Reche Finder, NP for evaluation of sharp pain in her right arm.  Reported improvement to discomfort with as needed aspirin  and noted pain was worse with movement.  She reported noncompliance with medications and was advised to resume and the indications for each medication was explained in detail.  She was also strongly encouraged to establish with a PCP for management of thyroid  disease and was seen by Speaks Georgina, FNP in May.    Patient denies chest pain,  palpitations, dyspnea, PND, orthopnea, nausea, vomiting, dizziness, syncope, edema, weight gain, or early satiety.   Discussed the use of AI scribe software for clinical note transcription with the patient, who gave verbal consent to proceed.  History of Present Illness    ***Notes:   Review of Systems  Please see the history of present illness.    All other systems reviewed and are otherwise negative except as noted above.  Physical Exam    Wt Readings from Last 3 Encounters:  08/05/23 228 lb 6.4 oz (103.6 kg)  05/12/23 240 lb (108.9 kg)  03/03/23 240 lb (108.9 kg)   CD:Uyzmz were no vitals filed for this visit.,There is no height or weight on file to calculate BMI. GEN: Well nourished, well developed in no acute distress Neck: No JVD; No carotid bruits Pulmonary: Clear to auscultation without rales, wheezing or rhonchi  Cardiovascular: Normal rate. Regular rhythm. Normal S1. Normal S2.   Murmurs: There is no murmur.  ABDOMEN: Soft, non-tender, non-distended EXTREMITIES:  No edema; No deformity   EKG/LABS/ Recent Cardiac Studies   ECG personally reviewed by me today - ***  Risk Assessment/Calculations:   {Does this patient have ATRIAL FIBRILLATION?[phone]}      Lab Results  Component Value Date   WBC 11.7 (H) 08/23/2023   HGB 11.2 08/23/2023   HCT 35.2 08/23/2023   MCV 82 08/23/2023  PLT 417 08/23/2023   Lab Results  Component Value Date   CREATININE 0.78 08/05/2023   BUN 6 08/05/2023   NA 141 08/05/2023   K 4.0 08/05/2023   CL 101 08/05/2023   CO2 24 08/05/2023   Lab Results  Component Value Date   CHOL 219 (H) 08/05/2023   HDL 42 08/05/2023   LDLCALC 149 (H) 08/05/2023   LDLDIRECT 80 05/07/2021   TRIG 156 (H) 08/05/2023   CHOLHDL 5.2 (H) 08/05/2023    Lab Results  Component Value Date   HGBA1C 6.1 (H) 01/22/2020   Assessment & Plan    Assessment and Plan Assessment & Plan     1.  Nonobstructive CAD  2.  History of PAF  3.   History of CVA  4.  Hypothyroidism  5.  History of PFO  6.  History of tobacco abuse:      Disposition: Follow-up with None or APP in *** months {Are you ordering a CV Procedure (e.g. stress test, cath, DCCV, TEE, etc)?   Press F2        :[id number]}   Signed, Wyn Raddle, Jackee Shove, NP 11/21/2023, 2:13 PM [facility]

## 2023-11-22 ENCOUNTER — Ambulatory Visit: Payer: MEDICAID | Attending: Nurse Practitioner | Admitting: Nurse Practitioner

## 2023-11-22 DIAGNOSIS — Z720 Tobacco use: Secondary | ICD-10-CM

## 2023-11-22 DIAGNOSIS — Q2112 Patent foramen ovale: Secondary | ICD-10-CM

## 2023-11-22 DIAGNOSIS — I2510 Atherosclerotic heart disease of native coronary artery without angina pectoris: Secondary | ICD-10-CM

## 2023-11-22 DIAGNOSIS — E039 Hypothyroidism, unspecified: Secondary | ICD-10-CM

## 2023-11-22 DIAGNOSIS — I480 Paroxysmal atrial fibrillation: Secondary | ICD-10-CM

## 2023-11-22 DIAGNOSIS — Z8673 Personal history of transient ischemic attack (TIA), and cerebral infarction without residual deficits: Secondary | ICD-10-CM

## 2023-11-23 ENCOUNTER — Encounter: Payer: Self-pay | Admitting: Podiatry

## 2023-11-23 ENCOUNTER — Ambulatory Visit (INDEPENDENT_AMBULATORY_CARE_PROVIDER_SITE_OTHER): Payer: MEDICAID | Admitting: Podiatry

## 2023-11-23 ENCOUNTER — Telehealth: Payer: Self-pay | Admitting: Podiatry

## 2023-11-23 DIAGNOSIS — M66872 Spontaneous rupture of other tendons, left ankle and foot: Secondary | ICD-10-CM

## 2023-11-23 DIAGNOSIS — Z9889 Other specified postprocedural states: Secondary | ICD-10-CM

## 2023-11-23 NOTE — Progress Notes (Signed)
 She presents today date of surgery 09/03/2023 thumb tendon repair posterior tibial.  She had a Kidner advancement with repair of the posterior tibial tendon.  States is still quite sore and still swells.  Objective: Vital signs are stable she is alert oriented x 3.  Pulses are palpable.  She does have some swelling and some tenderness on palpation of the incision site.  Incision site is completely healed at this point.  And there is no signs of infection.  She has good inversion against resistance  Assessment healing posterior tibial tendon repair.  Plan: Progress sent for physical therapy and strengthening exercises.  Also placed her in a Tri-Lock brace to get her back into regular shoe gear and strengthen the foot and leg.  I will follow-up with her once this is complete.  She is planning on looking into disability and part-time at work.

## 2023-11-23 NOTE — Telephone Encounter (Signed)
 Patient called and would like a prescription for pain medication sent to the [facility] on [address].

## 2023-11-24 ENCOUNTER — Other Ambulatory Visit: Payer: Self-pay | Admitting: Podiatry

## 2023-11-24 MED ORDER — TRAMADOL HCL 50 MG PO TABS
50.0000 mg | ORAL_TABLET | Freq: Three times a day (TID) | ORAL | 0 refills | Status: AC | PRN
Start: 2023-11-24 — End: 2023-11-29

## 2023-11-29 ENCOUNTER — Other Ambulatory Visit: Payer: Self-pay | Admitting: Nurse Practitioner

## 2023-11-29 ENCOUNTER — Ambulatory Visit: Payer: MEDICAID | Admitting: Gastroenterology

## 2023-11-29 DIAGNOSIS — E039 Hypothyroidism, unspecified: Secondary | ICD-10-CM

## 2023-11-29 NOTE — Progress Notes (Deleted)
 SABRA

## 2023-11-30 ENCOUNTER — Ambulatory Visit: Payer: MEDICAID | Admitting: Physical Therapy

## 2023-12-01 ENCOUNTER — Ambulatory Visit: Payer: MEDICAID | Attending: Podiatry | Admitting: Physical Therapy

## 2023-12-01 ENCOUNTER — Encounter: Payer: Self-pay | Admitting: Physical Therapy

## 2023-12-01 ENCOUNTER — Telehealth: Payer: Self-pay | Admitting: Podiatry

## 2023-12-01 ENCOUNTER — Other Ambulatory Visit: Payer: Self-pay

## 2023-12-01 DIAGNOSIS — R293 Abnormal posture: Secondary | ICD-10-CM | POA: Diagnosis present

## 2023-12-01 DIAGNOSIS — Z9889 Other specified postprocedural states: Secondary | ICD-10-CM | POA: Insufficient documentation

## 2023-12-01 DIAGNOSIS — M25572 Pain in left ankle and joints of left foot: Secondary | ICD-10-CM | POA: Insufficient documentation

## 2023-12-01 DIAGNOSIS — M6281 Muscle weakness (generalized): Secondary | ICD-10-CM | POA: Insufficient documentation

## 2023-12-01 DIAGNOSIS — M66872 Spontaneous rupture of other tendons, left ankle and foot: Secondary | ICD-10-CM | POA: Diagnosis not present

## 2023-12-01 NOTE — Telephone Encounter (Signed)
 Attempted to schedule an appointment with the patient; however, she was vague about the reason for the visit. Offered the first available appointment with Dr. Verta in [place] ([facility]) on September 9. Patient stated she needed to be seen sooner and requested availability in [place] [facility]). Advised her that the first available in [place] was October 1.  At that point, patient became upset, raised her voice, and said, Look girl, that is going backwards. I need you to make me an appointment. Shortly after, the call was disconnected.

## 2023-12-01 NOTE — Therapy (Signed)
 OUTPATIENT PHYSICAL THERAPY LOWER EXTREMITY EVALUATION   Patient Name: Margaret [name] MRN: [id number] DOB:January 28, 1971, 53 y.o., female Today's Date: 12/01/2023  END OF SESSION:  PT End of Session - 12/01/23 1351     Visit Number 1    Date for PT Re-Evaluation 01/26/24    Authorization Type [employer] - requesting auth    PT Start Time 1245    PT Stop Time 1330    PT Time Calculation (min) 45 min    Activity Tolerance Patient tolerated treatment well    Behavior During Therapy [facility] for tasks assessed/performed          Past Medical History:  Diagnosis Date   Anxiety    Arthritis    Asthma    controlled   CVA (cerebral vascular accident) (HCC)    a. 09/2016   Depression    Dysrhythmia    Headache    Hypertension    Hypothyroidism    Morbid obesity (HCC)    PAF (paroxysmal atrial fibrillation) (HCC) 09/2016   a. 30-day event monitor 6/18: predominant rhythm of sinus with isolated PACs and PVCs.  Two narrow complex tachycardia episodes concerning for A. Fib +/-atrial flutter or SVT.  No prolonged pauses; b. CHADS2VASc = 3 (stroke x 2, female)--> Eliquis .   PFO (patent foramen ovale)    a. TTE 6/18: EF of 60 to 65%, normal wall motion, grade 1 diastolic dysfunction; b. TEE 6/18: evidence of a very small atrial level right to left shunt by bubble study without visualization of ASD or PFO   Substance abuse (HCC)    Past use of cocaine    Tobacco abuse    Past Surgical History:  Procedure Laterality Date   FRACTURE SURGERY     TEE WITHOUT CARDIOVERSION N/A 09/08/2016   Procedure: TRANSESOPHAGEAL ECHOCARDIOGRAM (TEE);  Surgeon: Mady Bruckner, MD;  Location: [facility] ORS;  Service: Cardiovascular;  Laterality: N/A;   XI ROBOTIC ASSISTED VENTRAL HERNIA N/A 09/13/2019   Procedure: XI ROBOTIC Cholecystectomy, with primary non-mesh repair of EPIGATRIC HERNIA;  Surgeon: Lane Shope, MD;  Location: [facility] ORS;  Service: General;  Laterality: N/A;   Patient Active Problem List    Diagnosis Date Noted   COVID-19 vaccination declined 08/15/2023   Herpes zoster vaccination declined 08/15/2023   Encounter for screening mammogram for malignant neoplasm of breast 08/15/2023   Cervicalgia 08/15/2023   Mass of right lower extremity 08/15/2023   Hyperlipidemia LDL goal <70 08/15/2023   Screening for colon cancer 08/15/2023   Coronary artery disease involving native coronary artery of native heart without angina pectoris 08/05/2023   Aortic atherosclerosis (HCC) 09/03/2021   Adjustment disorder with mixed anxiety and depressed mood 08/05/2021   Insomnia, unspecified 08/05/2021   Right sided weakness 10/24/2020   Essential hypertension 10/24/2020   Constipation 06/27/2020   Fatigue 04/05/2020   Encounter to establish care with new doctor 01/22/2020   Encounter for hepatitis C screening test for low risk patient 01/22/2020   Screening examination for sexually transmitted disease 01/22/2020   Skin tag 01/22/2020   Nonintractable headache 01/22/2020   Status post laparoscopic cholecystectomy 09/28/2019   Dyspnea on exertion 08/15/2018   Multinodular goiter 04/28/2017   Paroxysmal atrial fibrillation (HCC) 10/29/2016   PFO (patent foramen ovale) 09/23/2016   Hyperlipidemia 09/17/2016   IFG (impaired fasting glucose) 09/17/2016   History of stroke 09/06/2016   Myopia 12/20/2014   Abdominal pain 12/20/2014   Depression 12/20/2014   Right arm pain 11/21/2014   Nonspecific reaction to  tuberculin test without active tuberculosis 10/19/2014   Hypothyroid 10/18/2014   Abnormal CBC 10/18/2014   Tobacco abuse 10/18/2014   Previous known suicide attempt 04/20/2014    PCP: [name], FNP  REFERRING PROVIDER: Verta Royden DASEN, DPM  REFERRING DIAG: [id number] (ICD-10-CM) - Tibialis posterior tendon tear, nontraumatic, left Z98.890 (ICD-10-CM) - Status post left foot surgery  THERAPY DIAG:  Pain in left ankle and joints of left foot  Muscle weakness (generalized)  Abnormal  posture  Rationale for Evaluation and Treatment: Rehabilitation  ONSET DATE: 09/03/23 - Lt posterior tendon repair surgery   SUBJECTIVE:   SUBJECTIVE STATEMENT: Pt is nearly 3 mo s/p Lt Kidner tendon advancement with repair of posterior tibial tendon performed on 09/03/23. She is referred for strengthening.  She was given a tri-lock brace with goal of getting into a shoe but she lost it so arrives in CAM walking boot.  She has not yet walked with shoe on Lt foot. I kept having ingrown toenails and the ankle started swelling - after tearing a ligament in my Lt knee 10-15 years ago.   Currently on short term disability through 12/13/23 - works as a Lawyer in a rehab facility.  PERTINENT HISTORY: s/p posterior tibial tendon repair left performed by Royden Verta, DPM on 09/03/23  PAIN:  PAIN:  Are you having pain? Yes NPRS scale: 7/10 Pain location: dorsal and medial aspect of Lt foot/ankle Pain orientation: Left  PAIN TYPE: sharp Pain description: intermittent  Aggravating factors: walking > 5 min - in CAM boot Relieving factors: pain meds   PRECAUTIONS: Other: Pt is unaware of any precautions and did not arrive with protocol  RED FLAGS: None   WEIGHT BEARING RESTRICTIONS: unsure - is wearing CAM boot, lost tri-lock brace  FALLS:  Has patient fallen in last 6 months? Yes. 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  DIAGNOSTIC FINDINGS:   PATIENT SURVEYS:  LEFS  Extreme difficulty/unable (0), Quite a bit of  difficulty (1), Moderate difficulty (2), Little difficulty (3), No difficulty (4) Survey date:    Any of your usual work, housework or school activities 1  2. Usual hobbies, recreational or sporting activities 1  3. Getting into/out of the bath 2  4. Walking between rooms 3  5. Putting on socks/shoes 3  6. Squatting  0  7. Lifting an object, like a bag of groceries from the floor 1  8. Performing light activities around your home 3  9. Performing heavy activities around your home 0  10. Getting into/out of a car 3  11. Walking 2 blocks 0  12. Walking 1 mile 0  13. Going up/down 10 stairs (1 flight) 2  14. Standing for 1 hour 0  15.  sitting for 1 hour 0  16. Running on even ground 0  17. Running on uneven ground 0  18. Making sharp turns while running fast 0  19. Hopping  0  20. Rolling over in bed 2  Score total:  19/80     COGNITION:  Overall cognitive status: Within functional limits for tasks assessed     SENSATION: Diminished to light touch along medial Lt ankle incision  EDEMA:  Figure 8: bil ankles 54cm   MUSCLE LENGTH: WNL  POSTURE: hammertoe Lt great toe, increased pronation of Lt foot compared to Rt  PALPATION: Decreased sensation along Lt medial ankle  LOWER EXTREMITY ROM:  Active ROM Right eval Left eval  Hip flexion    Hip extension    Hip abduction    Hip adduction    Hip internal rotation    Hip external rotation    Knee flexion    Knee extension    Ankle dorsiflexion 6 -3  Ankle plantarflexion 45 39  Ankle inversion 35 22  Ankle eversion 28 11   (Blank rows = not tested)  LOWER EXTREMITY MMT: LE strength bil 4+/5 with exception of Lt ankle: DF 3+/5, PF 3+/5, inversion 3+/5, eversion 4-/5    FUNCTIONAL TESTS:  5 times sit to stand: 8.14  GAIT: Distance walked:  Assistive device utilized: None Level of assistance: wearing CAM boot Comments: Lt LE abducted to accommodate height of CAM boot                                                                                                                                 /TREATMENT DATE:  12/01/23 Established HEP  PATIENT EDUCATION:  Education details: [email or web address] Person educated: Patient Education method: Explanation, Demonstration, Verbal cues, and Handouts Education comprehension: verbalized understanding and returned demonstration  HOME EXERCISE PROGRAM: Access Code: [email or web address] URL: [email or web address] Date: 12/01/2023 Prepared by: Orvil Shayley Medlin  Exercises - Supine Ankle Dorsiflexion and Plantarflexion AROM  - 1 x daily - 7 x weekly - 2 sets - 10 reps - Supine Ankle Inversion and Eversion AROM  - 1 x daily - 7 x weekly - 2 sets - 10 reps - Seated Ankle Circles  - 1 x daily - 7 x weekly - 2 sets - 10 reps - Toe Spreading  - 1 x daily - 7 x weekly - 2 sets - 10 reps - Seated Arch Lifts  - 1 x daily - 7 x weekly - 2 sets - 10 reps  ASSESSMENT:  CLINICAL IMPRESSION: Patient is a 53 y.o. female who was seen today for physical therapy evaluation and treatment for Lt ankle strengthening following surgery on 09/03/23 for posterior tibial tendon repair.  Tears occurred without acute injury but she links her pain starting after a ligament tear in Lt knee 10-15 years ago.  She ambulates in CAM boot but had been encouraged to use tri-lock brace and shoe by referring doctor but lost the brace so hasn't worn a shoe yet.  She presents with decreased Lt ankle ROM and strength as expected in this post-op stage.  She has decreased sensation along medial ankle incision.  She has a hammer toe of Lt great toe and increased Lt foot pronation.  Swelling is  under control and fig 8 of the ankle is equal to Rt ankle.  Scar is moving well.  Pt scores 19/80 on LEFS.  She is currently on short term disability which will need to be re-evaluated by referring doctor before 12/13/23.  Pt will benefit from skilled PT to address impairments noted above and enable her return to ind in  community and at work.  OBJECTIVE IMPAIRMENTS: Abnormal gait, decreased activity tolerance, decreased balance, decreased knowledge of condition, decreased mobility, difficulty walking, decreased ROM, decreased strength, hypomobility, impaired sensation, postural dysfunction, and pain.   ACTIVITY LIMITATIONS: carrying, lifting, bending, sitting, standing, squatting, stairs, transfers, and locomotion level  PARTICIPATION LIMITATIONS: meal prep, cleaning, laundry, shopping, community activity, and occupation  PERSONAL FACTORS: 1 comorbidity: h/o stroke are also affecting patient's functional outcome.   REHAB POTENTIAL: Excellent  CLINICAL DECISION MAKING: Evolving/moderate complexity  EVALUATION COMPLEXITY: Moderate   GOALS: Goals reviewed with patient? Yes  SHORT TERM GOALS: Target date: 12/29/23 Pt will be ind with initial HEP Baseline: Goal status: INITIAL  2.  Pt will achieve nearly symmetrical gait pattern shoe on Lt foot with ankle brace Baseline:  Goal status: INITIAL  3.  Pt will improve Lt ankle DF to enable at least a 1/4 squat  Baseline:  Goal status: INITIAL  4.  Pt will be able to ascend 10 stairs to get into home using alt step pattern Baseline:  Goal status: INITIAL    LONG TERM GOALS: Target date: 01/26/24  Pt will be ind with final HEP and understand importance of compliance to maintain gains made in PT. Baseline:  Goal status: INITIAL  2.  Pt will be able to perform standing activities for at least 1 hour with shoe + ankle brace with min pain Baseline:  Goal status: INITIAL  3.  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  Pt will improve Lt ankle ROM to at least within 5 deg of Rt ankle to enable stairs, squatting and nearly symmetrical gait pattern Baseline:  Goal status: INITIAL  5.  Pt will be able to squat, lift and carry a 20lb box from chair  height to demo work-related tasks as CNA Baseline:  Goal status: INITIAL     PLAN:  PT FREQUENCY: 1x/week (limited by transportation)  PT DURATION: 8 weeks  PLANNED INTERVENTIONS: 97110-Therapeutic exercises, 97530- Therapeutic activity, 97112- Neuromuscular re-education, 97535- Self Care, 02859- Manual therapy, [phone]- Gait training, [phone]- Vasopneumatic device, [phone] (1-2 muscles), [id number] (3+ muscles)- Dry Needling, Patient/Family education, Balance training, Taping, Joint mobilization, Scar mobilization, Cryotherapy, and Moist heat  PLAN FOR NEXT SESSION: NuStep, review HEP, did Pt see DPM and get a new ankle brace (lost her tri-lock brace to wear with shoe so was in CAM walker at eval), work on foot/ankle strength and ROM, Pt on short term disability from CNA position, has 10 stairs to get into home   [employer], PT 12/01/23 1:52 PM

## 2023-12-02 ENCOUNTER — Encounter: Payer: Self-pay | Admitting: Podiatry

## 2023-12-02 ENCOUNTER — Ambulatory Visit: Payer: MEDICAID | Admitting: Podiatry

## 2023-12-08 ENCOUNTER — Encounter (HOSPITAL_COMMUNITY): Payer: Self-pay

## 2023-12-08 ENCOUNTER — Ambulatory Visit (HOSPITAL_COMMUNITY)
Admission: RE | Admit: 2023-12-08 | Discharge: 2023-12-08 | Disposition: A | Discharge status: Home / Self Care | Payer: MEDICAID | Source: Ambulatory Visit | Attending: Internal Medicine | Admitting: Internal Medicine

## 2023-12-08 NOTE — ED Triage Notes (Signed)
 Pt c/o rt shoulder pain radiating up neck for over 2wks. Denies known injury. States took tylenol  and smoked a THC cigar with relief.

## 2023-12-13 NOTE — Progress Notes (Deleted)
   Cardiology Clinic Note   Date: 12/13/2023 ID: Margaret [name], DOB Jan 03, 1971, MRN [id number]  Primary Cardiologist:  None  Chief Complaint   Margaret [name] is a 53 y.o. female who presents to the clinic today for ***  Patient Profile   Margaret [name] is followed by Dr. Mady for the history outlined below.      Past medical history significant for: Nonobstructive CAD. CTA 07/24/2022: Coronary calcium  score of 0.  Nonobstructive CAD with noncalcified plaque in the mid LAD and ramus causing minimal stenosis. PAF. Hypertension.  Hyperlipidemia. Hypothyroidism. CVA. Echo 09/07/2016: EF 60 to 65%.  No RWMA.  Grade I DD.  Normal PA pressure.  Normal RV size/function.  No significant valvular abnormalities. TEE 09/08/2016: Evidence of very small atrial level right-to-left shunt by bubble study without visualization of ASD or PFO. Tobacco abuse.  In summary, ***  Patient was last seen in the office by ***     History of Present Illness    Today, patient ***  ***  ROS: All other systems reviewed and are otherwise negative except as noted in History of Present Illness.  EKGs/Labs Reviewed        08/05/2023: ALT 11; AST 14; BUN 6; Creatinine, Ser 0.78; Potassium 4.0; Sodium 141   08/23/2023: Hemoglobin 11.2; WBC 11.7   08/05/2023: TSH 16.900   No results found for requested labs within last 365 days.  ***  Risk Assessment/Calculations    {Does this patient have ATRIAL FIBRILLATION?[phone]} No BP recorded.  {Refresh Note OR Click here to enter BP  :1}***        Physical Exam    VS:  LMP 05/07/2016  , BMI There is no height or weight on file to calculate BMI.  GEN: Well nourished, well developed, in no acute distress. Neck: No JVD or carotid bruits. Cardiac: *** RRR. *** No murmur. No rubs or gallops.   Respiratory:  Respirations regular and unlabored. Clear to auscultation without rales, wheezing or rhonchi. GI: Soft, nontender, nondistended. Extremities: Radials/DP/PT  2+ and equal bilaterally. No clubbing or cyanosis. No edema ***  Skin: Warm and dry, no rash. Neuro: Strength intact.  Assessment & Plan   ***  Disposition: ***     {Are you ordering a CV Procedure (e.g. stress test, cath, DCCV, TEE, etc)?   Press F2        :[id number]}   Signed, Barnie HERO. Stephnie Parlier, DNP, NP-C

## 2023-12-14 ENCOUNTER — Ambulatory Visit: Payer: MEDICAID | Admitting: Student

## 2023-12-16 ENCOUNTER — Ambulatory Visit: Payer: MEDICAID | Admitting: Podiatry

## 2023-12-22 ENCOUNTER — Encounter (HOSPITAL_BASED_OUTPATIENT_CLINIC_OR_DEPARTMENT_OTHER): Payer: Self-pay

## 2023-12-23 ENCOUNTER — Ambulatory Visit: Payer: MEDICAID | Admitting: Podiatry

## 2023-12-23 NOTE — Progress Notes (Unsigned)
 Cardiology Office Note    Patient Name: Margaret [name] Date of Encounter: 12/23/2023  Primary Care Provider:  Georgina Speaks, FNP Primary Cardiologist:  Lonni Hanson, MD Primary Electrophysiologist: None   Past Medical History    Past Medical History:  Diagnosis Date   Anxiety    Arthritis    Asthma    controlled   CVA (cerebral vascular accident) [facility])    a. 09/2016   Depression    Dysrhythmia    Headache    Hypertension    Hypothyroidism    Morbid obesity (HCC)    PAF (paroxysmal atrial fibrillation) (HCC) 09/2016   a. 30-day event monitor 6/18: predominant rhythm of sinus with isolated PACs and PVCs.  Two narrow complex tachycardia episodes concerning for A. Fib +/-atrial flutter or SVT.  No prolonged pauses; b. CHADS2VASc = 3 (stroke x 2, female)--> Eliquis .   PFO (patent foramen ovale)    a. TTE 6/18: EF of 60 to 65%, normal wall motion, grade 1 diastolic dysfunction; b. TEE 6/18: evidence of a very small atrial level right to left shunt by bubble study without visualization of ASD or PFO   Substance abuse (HCC)    Past use of cocaine    Tobacco abuse     History of Present Illness  Margaret [name]  is a 53 year old female with a PMH of  PAF complicated by CVA, patent foramen ovale, COVID in 05/2019, 05/2020 and 03/2021, HTN, HLD, hypothyroidism, asthma, intractable headache, tobacco use, and remote substance abuse with marijuana and cocaine who presents today for annual follow-up.   [name] was last seen on 12/17/2022 by Reche Finder, NP for evaluation of sharp pain in her right arm.  She reported improvement to discomfort with as needed aspirin  and noted pain was worse with movement.  She reported noncompliance with medications and was advised to resume and the indications for each medication was explained in detail.  She was also strongly encouraged to establish with a PCP for management of thyroid  disease and was seen by Speaks Georgina, FNP in May.   She was seen in the  ED on/8/25 with complaint of headache with right sided ear pain and upper extremity weakness.  She also endorsed shortness of breath.  Decision was made to obtain a CTA of the head and neck for further evaluation however patient left AMA and test was not completed.  Patient denies chest pain, palpitations, dyspnea, PND, orthopnea, nausea, vomiting, dizziness, syncope, edema, weight gain, or early satiety.   Discussed the use of AI scribe software for clinical note transcription with the patient, who gave verbal consent to proceed.  History of Present Illness    ***Notes: She completed coronary CTA score of 0 on 07/07/2022   Review of Systems  Please see the history of present illness.    All other systems reviewed and are otherwise negative except as noted above.  Physical Exam    Wt Readings from Last 3 Encounters:  08/05/23 228 lb 6.4 oz (103.6 kg)  05/12/23 240 lb (108.9 kg)  03/03/23 240 lb (108.9 kg)   CD:Uyzmz were no vitals filed for this visit.,There is no height or weight on file to calculate BMI. GEN: Well nourished, well developed in no acute distress Neck: No JVD; No carotid bruits Pulmonary: Clear to auscultation without rales, wheezing or rhonchi  Cardiovascular: Normal rate. Regular rhythm. Normal S1. Normal S2.   Murmurs: There is no murmur.  ABDOMEN: Soft, non-tender, non-distended EXTREMITIES:  No edema; No deformity   EKG/LABS/ Recent Cardiac Studies   ECG personally reviewed by me today - ***  Risk Assessment/Calculations:   {Does this patient have ATRIAL FIBRILLATION?[phone]}      Lab Results  Component Value Date   WBC 11.7 (H) 08/23/2023   HGB 11.2 08/23/2023   HCT 35.2 08/23/2023   MCV 82 08/23/2023   PLT 417 08/23/2023   Lab Results  Component Value Date   CREATININE 0.78 08/05/2023   BUN 6 08/05/2023   NA 141 08/05/2023   K 4.0 08/05/2023   CL 101 08/05/2023   CO2 24 08/05/2023   Lab Results  Component Value Date   CHOL 219 (H)  08/05/2023   HDL 42 08/05/2023   LDLCALC 149 (H) 08/05/2023   LDLDIRECT 80 05/07/2021   TRIG 156 (H) 08/05/2023   CHOLHDL 5.2 (H) 08/05/2023    Lab Results  Component Value Date   HGBA1C 6.1 (H) 01/22/2020   Assessment & Plan    Assessment and Plan Assessment & Plan     1.Nonobstructive CAD: - Coronary CTA completed on 07/2022 noting nonobstructive disease with 0-24% stenosis of LAD -Today patient reports***   2.  History of PAF: - Today patient is*** -Continue current rate control with*** -Continue Eliquis  5 mg twice daily   3.  History of CVA: - Continue current GDMT with Eliquis , Lipitor   4.  Hypothyroidism   5.  History of PFO: - Patient is currently on lifelong anticoagulation with Eliquis  5 mg twice daily   6.  History of tobacco abuse:      Disposition: Follow-up with Lonni Hanson, MD or APP in *** months {Are you ordering a CV Procedure (e.g. stress test, cath, DCCV, TEE, etc)?   Press F2        :[id number]}   Signed, Wyn Raddle, Jackee Shove, NP 12/23/2023, 6:44 PM  [facility]

## 2023-12-24 ENCOUNTER — Ambulatory Visit: Payer: MEDICAID | Attending: Nurse Practitioner | Admitting: Nurse Practitioner

## 2023-12-24 DIAGNOSIS — Z720 Tobacco use: Secondary | ICD-10-CM

## 2023-12-24 DIAGNOSIS — Q2112 Patent foramen ovale: Secondary | ICD-10-CM

## 2023-12-24 DIAGNOSIS — I480 Paroxysmal atrial fibrillation: Secondary | ICD-10-CM

## 2023-12-24 DIAGNOSIS — Z8673 Personal history of transient ischemic attack (TIA), and cerebral infarction without residual deficits: Secondary | ICD-10-CM

## 2023-12-24 DIAGNOSIS — I2510 Atherosclerotic heart disease of native coronary artery without angina pectoris: Secondary | ICD-10-CM

## 2023-12-25 NOTE — Progress Notes (Signed)
 Error

## 2024-01-03 ENCOUNTER — Ambulatory Visit: Payer: MEDICAID | Admitting: Physician Assistant

## 2024-01-06 ENCOUNTER — Ambulatory Visit: Payer: MEDICAID | Admitting: Podiatry

## 2024-01-06 ENCOUNTER — Ambulatory Visit (INDEPENDENT_AMBULATORY_CARE_PROVIDER_SITE_OTHER): Payer: MEDICAID | Admitting: Podiatry

## 2024-01-06 DIAGNOSIS — M66872 Spontaneous rupture of other tendons, left ankle and foot: Secondary | ICD-10-CM

## 2024-01-07 MED ORDER — MELOXICAM 15 MG PO TABS: 15.0000 mg | ORAL_TABLET | Freq: Every day | ORAL | 3 refills | Status: AC

## 2024-01-07 NOTE — Progress Notes (Signed)
 She presents today for follow-up of her postop regarding her posterior tibial tendon repair date of surgery Sep 03, 2023.  She states that she is doing much better she went to 1 physical therapy appointment and then no more.  She also states that she has been wearing her cam boot some and lost her Tri-Lock brace.  She states that is feeling better but still has a way to go.  Objective: Vital signs are stable oriented x 3 much decrease in edema but still present.  Still has tenderness on palpation of the surgical site along the posterior and inferior medial aspect of the left ankle.  There is no erythema cellulitis drainage or odor she has good inversion against resistance and there is some tenderness present.  Assessment: Well-healing surgical foot.  Plan: We are going to refer her to another physical therapy group at [facility] upstairs in our building.  We are also going to start her on meloxicam  No. 90 with 3 refills.  I am going to place her in another Tri-Lock brace and allow her to wear a tennis shoe with this.  She will notify us  with any questions or concerns I will follow-up with her once she has completed her physical therapy.

## 2024-01-24 ENCOUNTER — Ambulatory Visit: Payer: Self-pay

## 2024-01-24 ENCOUNTER — Ambulatory Visit (INDEPENDENT_AMBULATORY_CARE_PROVIDER_SITE_OTHER): Payer: MEDICAID | Admitting: Medical

## 2024-01-24 ENCOUNTER — Encounter: Payer: Self-pay | Admitting: Medical

## 2024-01-24 VITALS — BP 110/70 | HR 74 | Temp 97.9°F | Wt 225.2 lb

## 2024-01-24 DIAGNOSIS — E039 Hypothyroidism, unspecified: Secondary | ICD-10-CM

## 2024-01-24 DIAGNOSIS — R6883 Chills (without fever): Secondary | ICD-10-CM

## 2024-01-24 DIAGNOSIS — R10819 Abdominal tenderness, unspecified site: Secondary | ICD-10-CM | POA: Diagnosis not present

## 2024-01-24 DIAGNOSIS — Z720 Tobacco use: Secondary | ICD-10-CM

## 2024-01-24 DIAGNOSIS — Z113 Encounter for screening for infections with a predominantly sexual mode of transmission: Secondary | ICD-10-CM | POA: Diagnosis not present

## 2024-01-24 DIAGNOSIS — R059 Cough, unspecified: Secondary | ICD-10-CM

## 2024-01-24 DIAGNOSIS — R531 Weakness: Secondary | ICD-10-CM | POA: Diagnosis not present

## 2024-01-24 DIAGNOSIS — R110 Nausea: Secondary | ICD-10-CM

## 2024-01-24 LAB — POC COVID19 BINAXNOW: SARS Coronavirus 2 Ag: NEGATIVE

## 2024-01-24 LAB — POCT URINALYSIS DIP (PROADVANTAGE DEVICE)
Bilirubin, UA: NEGATIVE
Blood, UA: NEGATIVE
Glucose, UA: NEGATIVE mg/dL
Ketones, POC UA: NEGATIVE mg/dL
Leukocytes, UA: NEGATIVE
Nitrite, UA: NEGATIVE
Protein Ur, POC: NEGATIVE mg/dL
Specific Gravity, Urine: 1.015
Urobilinogen, Ur: NEGATIVE
pH, UA: 6 (ref 5.0–8.0)

## 2024-01-24 LAB — POCT INFLUENZA A/B
Influenza A, POC: NEGATIVE
Influenza B, POC: NEGATIVE

## 2024-01-24 MED ORDER — ONDANSETRON 4 MG PO TBDP: 4.0000 mg | ORAL_TABLET | Freq: Three times a day (TID) | ORAL | 0 refills | Status: AC | PRN

## 2024-01-24 NOTE — Progress Notes (Signed)
 Subjective: Chief Complaint  Patient presents with   Acute Visit    Headache started last night at movies. Was sitting in a massage chair and just got weak and fatigued. Abdominal pain started with nausea. Sees heart doctor this week but just needs a work note since she called out today.     History of Present Illness Margaret [name] is a 53 year old female with a history of stroke who presents with nausea, weakness, and respiratory symptoms.  She has been experiencing nausea, weakness, and respiratory symptoms since the weekend. The nausea is described as a sensation of wanting to vomit with an empty feeling in her stomach, but there is no vomiting. She also has sneezing, an itchy throat, ear pain, and a mild cough. No fever, but she has experienced chills. Mild diarrhea is present.  She works as a Lawyer at [employer] and has been around sick individuals. She smokes and denies any recent chest pain, slurred speech, or confusion. She reports increased urination without a burning sensation and denies a history of urinary tract infections.  She has a history of a stroke in 2018 and is scheduled to see a heart doctor later this week for routine follow up. She is currently taking Eliquis , Lipitor, levothyroxine , and metoprolol . She feels weak and experiences tingling in her hands.  She reports weight gain since starting thyroid  medication back in May, and is currently at 225 pounds. She has not had her thyroid  levels rechecked since May.  She requested a baseline HIV test today.  She has not been with new partner in 3 years.   Declines other testing, just wants HIV test.  No other aggravating or relieving factors. No other complaint.    Past Medical History:  Diagnosis Date   Anxiety    Arthritis    Asthma    controlled   CVA (cerebral vascular accident) (HCC)    a. 09/2016   Depression    Dysrhythmia    Headache    Hypertension    Hypothyroidism    Morbid obesity (HCC)     PAF (paroxysmal atrial fibrillation) (HCC) 09/2016   a. 30-day event monitor 6/18: predominant rhythm of sinus with isolated PACs and PVCs.  Two narrow complex tachycardia episodes concerning for A. Fib +/-atrial flutter or SVT.  No prolonged pauses; b. CHADS2VASc = 3 (stroke x 2, female)--> Eliquis .   PFO (patent foramen ovale)    a. TTE 6/18: EF of 60 to 65%, normal wall motion, grade 1 diastolic dysfunction; b. TEE 6/18: evidence of a very small atrial level right to left shunt by bubble study without visualization of ASD or PFO   Substance abuse (HCC)    Past use of cocaine    Tobacco abuse    Current Outpatient Medications on File Prior to Visit  Medication Sig Dispense Refill   apixaban  (ELIQUIS ) 5 MG TABS tablet Take 1 tablet (5 mg total) by mouth 2 (two) times daily. 180 tablet 1   atorvastatin  (LIPITOR) 40 MG tablet Take 1 tablet (40 mg total) by mouth daily. 90 tablet 3   levothyroxine  (SYNTHROID ) 25 MCG tablet TAKE 1 TABLET(25 MCG) BY MOUTH DAILY BEFORE BREAKFAST 90 tablet 1   meloxicam  (MOBIC ) 15 MG tablet Take 1 tablet (15 mg total) by mouth daily. 90 tablet 3   metoprolol  tartrate (LOPRESSOR ) 25 MG tablet Take 1 tablet (25 mg total) by mouth 2 (two) times daily. Unsure of dose 90 tablet 1   No current  facility-administered medications on file prior to visit.    ROS as in subjective   Objective: BP 110/70   Pulse 74   Temp 97.9 F (36.6 C)   Wt 225 lb 3.2 oz (102.2 kg)   LMP 05/07/2016   SpO2 99%   BMI 37.48 kg/m   General appearence: alert, no distress, WD/WN, African American female HEENT: normocephalic, sclerae anicteric, TMs pearly, nares patent, no discharge or erythema, pharynx normal Oral cavity: MMM, no lesions Neck: supple, no lymphadenopathy, no thyromegaly, no masses Heart: RRR, normal S1, S2, no murmurs Lungs: CTA bilaterally, no wheezes, rhonchi, or rales Abdomen: +bs, soft, mild generalized tenderness, otherwise non distended, no masses, no  hepatomegaly, no splenomegaly Back: mild bilat paraspinal lumbar tendnerss, normal ROM Pulses: 2+ symmetric, upper and lower extremities, normal cap refill Neuro: CN2-12 intact, nonfocal exam     Assessment and Plan Encounter Diagnoses  Name Primary?   Weakness Yes   Cough, unspecified type    Abdominal tenderness without rebound tenderness, unspecified location    Nausea    Chills    Hypothyroidism, unspecified type    Tobacco abuse    Screen for STD (sexually transmitted disease)    We discussed her variety of symptoms.  Differential is quite wide.  Weakness, manage possible causes -Lab evaluations as below - We discussed differential which could include viral syndrome, infection, electrolyte disturbance, thyroid  issue or other  Cough, ear discomfort, chills, nausea - Symptoms could be attributable to viral syndrome - Order labs as below  Hypothyroidism, thyroid  medication initiated back in May 2025 by her PCP -Updated labs today  Nausea -Prescribed Zofran  to use as needed   STD screening - She will need a baseline HIV today -She declined other STD screening   Tobacco use -Advised cessation -Consider CT lung cancer screening by PCP in follow up    Margaret [name] was seen today for acute visit.  Diagnoses and all orders for this visit:  Weakness  Cough, unspecified type  Abdominal tenderness without rebound tenderness, unspecified location -     POCT Urinalysis DIP (Proadvantage Device) -     CBC with Differential/Platelet -     Comprehensive metabolic panel with GFR  Nausea  Chills  Hypothyroidism, unspecified type -     TSH + free T4  Tobacco abuse  Screen for STD (sexually transmitted disease) -     HIV Antibody (routine testing w rflx)     F/u pending labs

## 2024-01-24 NOTE — Telephone Encounter (Signed)
 FYI Only or Action Required?: FYI only for provider.  Patient was last seen in primary care on 08/05/2023 by Georgina Speaks, [name]  Called Nurse Triage reporting Dizziness.  Symptoms began last night.  Interventions attempted: Nothing.  Symptoms are: unchanged.  Triage Disposition: See Physician Within 24 Hours  Patient/caregiver understands and will follow disposition?: Yes                  Copied from CRM #[id number]. Topic: Clinical - Red Word Triage >> Jan 24, 2024 11:04 AM Selinda RAMAN wrote: Red Word that prompted transfer to Nurse Triage: The patient called in stating she has been having abdominal pain, headache, weakness, vomiting and major sneezing. I will transfer her to E2C2 NT. Reason for Disposition  [1] MODERATE dizziness (e.g., interferes with normal activities) AND [2] has NOT been evaluated by doctor (or NP/PA) for this  (Exception: Dizziness caused by heat exposure, sudden standing, or poor fluid intake.)  Answer Assessment - Initial Assessment Questions Pt is stating she has several different issues. She has a HA on the left side of her head, But muscle pain on the roght side off neck and shoulder. She reports dizziness, and tingling in her right hand. She also has abdominal pain, and sneezing. Appt made for today at [facility].   1. DESCRIPTION: Describe your dizziness.     dizziness 2. LIGHTHEADED: Do you feel lightheaded? (e.g., somewhat faint, woozy, weak upon standing)     yes 3. VERTIGO: Do you feel like either you or the room is spinning or tilting? (i.e., vertigo)     no 4. SEVERITY: How bad is it?  Do you feel like you are going to faint? Can you stand and walk?     Neck and shoulder on right side tension 5. ONSET:  When did the dizziness begin?     Last night 6. AGGRAVATING FACTORS: Does anything make it worse? (e.g., standing, change in head position)     Pain ointment 7. HEART RATE: Can you tell me your heart rate?  How many beats in 15 seconds?  (Note: Not all patients can do this.)       Unable to take  8. CAUSE: What do you think is causing the dizziness? (e.g., decreased fluids or food, diarrhea, emotional distress, heat exposure, new medicine, sudden standing, vomiting; unknown)     unsure 9. RECURRENT SYMPTOM: Have you had dizziness before? If Yes, ask: When was the last time? What happened that time?     Yes - a few years  10. OTHER SYMPTOMS: Do you have any other symptoms? (e.g., fever, chest pain, vomiting, diarrhea, bleeding)       HA on left side  Protocols used: Dizziness - Lightheadedness-A-AH

## 2024-01-25 ENCOUNTER — Ambulatory Visit: Payer: Self-pay | Admitting: Medical

## 2024-01-25 ENCOUNTER — Other Ambulatory Visit: Payer: Self-pay | Admitting: Medical

## 2024-01-25 LAB — COMPREHENSIVE METABOLIC PANEL WITH GFR
ALT: 10 IU/L (ref 0–32)
AST: 15 IU/L (ref 0–40)
Albumin: 3.9 g/dL (ref 3.8–4.9)
Alkaline Phosphatase: 114 IU/L (ref 49–135)
BUN/Creatinine Ratio: 9 (ref 9–23)
BUN: 7 mg/dL (ref 6–24)
Bilirubin Total: 0.3 mg/dL (ref 0.0–1.2)
CO2: 23 mmol/L (ref 20–29)
Calcium: 9.1 mg/dL (ref 8.7–10.2)
Chloride: 105 mmol/L (ref 96–106)
Creatinine, Ser: 0.82 mg/dL (ref 0.57–1.00)
Globulin, Total: 2.7 g/dL (ref 1.5–4.5)
Glucose: 81 mg/dL (ref 70–99)
Potassium: 4 mmol/L (ref 3.5–5.2)
Sodium: 142 mmol/L (ref 134–144)
Total Protein: 6.6 g/dL (ref 6.0–8.5)
eGFR: 86 mL/min/1.73 (ref >59)

## 2024-01-25 LAB — CBC WITH DIFFERENTIAL/PLATELET
Basophils Absolute: 0 x10E3/uL (ref 0.0–0.2)
Basos: 0 %
EOS (ABSOLUTE): 0.1 x10E3/uL (ref 0.0–0.4)
Eos: 1 %
Hematocrit: 28 % — ABNORMAL LOW (ref 34.0–46.6)
Hemoglobin: 8.5 g/dL — ABNORMAL LOW (ref 11.1–15.9)
Immature Grans (Abs): 0 x10E3/uL (ref 0.0–0.1)
Immature Granulocytes: 0 %
Lymphocytes Absolute: 3 x10E3/uL (ref 0.7–3.1)
Lymphs: 33 %
MCH: 24.9 pg — ABNORMAL LOW (ref 26.6–33.0)
MCHC: 30.4 g/dL — ABNORMAL LOW (ref 31.5–35.7)
MCV: 82 fL (ref 79–97)
Monocytes Absolute: 0.6 x10E3/uL (ref 0.1–0.9)
Monocytes: 6 %
Neutrophils Absolute: 5.4 x10E3/uL (ref 1.4–7.0)
Neutrophils: 60 %
Platelets: 545 x10E3/uL — ABNORMAL HIGH (ref 150–450)
RBC: 3.41 x10E6/uL — ABNORMAL LOW (ref 3.77–5.28)
RDW: 14.7 % (ref 11.7–15.4)
WBC: 9.1 x10E3/uL (ref 3.4–10.8)

## 2024-01-25 LAB — TSH+FREE T4
Free T4: 0.81 ng/dL — AB (ref 0.82–1.77)
TSH: 5.8 u[IU]/mL — AB (ref 0.450–4.500)

## 2024-01-25 LAB — HIV ANTIBODY (ROUTINE TESTING W REFLEX): HIV Screen 4th Generation wRfx: NONREACTIVE

## 2024-01-25 MED ORDER — LEVOTHYROXINE SODIUM 50 MCG PO TABS: 50.0000 ug | ORAL_TABLET | Freq: Every day | ORAL | 1 refills | Status: DC

## 2024-01-25 MED ORDER — FERROUS GLUCONATE 324 (38 FE) MG PO TABS: 324.0000 mg | ORAL_TABLET | Freq: Two times a day (BID) | ORAL | 2 refills | Status: AC

## 2024-01-25 NOTE — Telephone Encounter (Signed)
Asher to refill for 90 days  

## 2024-01-25 NOTE — Progress Notes (Signed)
 Thank you we will reach out

## 2024-01-25 NOTE — Progress Notes (Signed)
 Sabrina-see if we can add iron and ferritin panel   Lab results:  There are several lab findings.  Your thyroid  is undercorrected so we need to increase your thyroid  dose.  I will send this to the pharmacy.  The most notable finding is that you are quite anemic, hemoglobin in the 8 range which is very low.  Your liver kidney and electrolytes is normal.  HIV negative.  These findings still do not explain some of your recent respiratory symptoms which is likely just a virus  Drink plenty of fluids over the next few days, take some extra vitamins such as EmergenC immune + over the counter.    Regarding anemia are you having heavy periods?  Have you ever had to have an iron infusion?  I do not see prior colonoscopy either.  Have you had a colonoscopy?  Any blood in the stool?  We will need to have you start iron supplement.  I will send this to the pharmacy.

## 2024-01-25 NOTE — Progress Notes (Signed)
 Hello Margaret [name]  I saw your patient for acute visit yesterday.  See lab result notes.  She will definitely need follow-up soon with you for anemia workup  Thanks and have a great day [address]

## 2024-01-26 NOTE — Progress Notes (Signed)
 Results to MyChart

## 2024-02-01 NOTE — Progress Notes (Deleted)
 Cardiology Office Note   Date:  02/01/2024  ID:  HAYLIN CAMILLI, DOB Apr 23, 1970, MRN [id number] PCP: Georgina Speaks, FNP   [facility]Care Providers Cardiologist:  Lonni Hanson, MD   History of Present Illness Margaret [name] is a 53 y.o. female with past medical history of PAF on Eliquis , PFO, hypothyroidism, asthma, remote history of polysubstance abuse (cocaine, marijuana), tobacco abuse, COVID-19 (05/2019), nonobstructive CAD here for follow-up appointment.  Hospitalized 09/2016 due to slurred speech and right frontal lobe stroke.  Echo with normal LVEF, grade 1 DD.  Carotid duplex less than 50% bilateral internal artery stenosis.  TEE with small atrial level right-to-left shunting consistent with PFO.  Followed by 30-day monitor with 2 episodes of narrow complex tachycardia concerning for atrial flutter/fibrillation.  She was placed on Xarelto  and beta-blocker therapy and later switched from Xarelto  to Eliquis .  ED visit 08/22/2027 for neck pain and intermittent paresthesias to the left arm with CT of the head unremarkable and pain reproducible on palpation atypical for angina.  April 2024 subsequent cardiac CTA coronary calcium  score of 0 that did note total plaque volume of 22 mm3 placing her in a 63rd percentile for age/sex matched controls with overall minimal nonobstructive disease 0 to 25% stenosis in the LAD and ramus branch.  Was seen 12/17/2022 due to sharp pain in her right arm radiating to her back.  Presented with her daughter at that time.  This was occurring for the last 1 to 2 weeks.  Been taking aspirin  81 mg as needed with some improvement in pain.  Describes pain as sharp and worse with movement.  Does not have a PCP.  Has not taken her medication in a month and did not feel she needed them.  Each of her medications were reviewed with the indications.  Today, she***  ROS: Pertinent ROS in HPI  Studies Reviewed      ECHOCARDIOGRAM   ECHOCARDIOGRAM COMPLETE 09/07/2016    Narrative *[facility]* [address] [place], [place] [address] [phone]   ------------------------------------------------------------------- Transthoracic Echocardiography   Patient:    Carlynn, Leduc MR #:       [id number] Study Date: 09/07/2016 Gender:     F Age:        45 Height:     167.6 cm Weight:     97.1 kg BSA:        2.16 m^2 Pt. Status: Room:   ATTENDING    Jenel Alm JULIANNA TISA Jenel Alm JULIANNA REFERRING    Jenel Alm JULIANNA SONOGRAPHER  Christopher Hege RDCS PERFORMING   [facility]   cc:   ------------------------------------------------------------------- LV EF: 60% -   65%   ------------------------------------------------------------------- Indications:      Stroke 434.91.   ------------------------------------------------------------------- History:   PMH:  Asthma, thyroid  disease.   ------------------------------------------------------------------- Study Conclusions   - Left ventricle: The cavity size was normal. Wall thickness was normal. Systolic function was normal. The estimated ejection fraction was in the range of 60% to 65%. Wall motion was normal; there were no regional wall motion abnormalities. Doppler parameters are consistent with abnormal left ventricular relaxation (grade 1 diastolic dysfunction).   Impressions:   - No cardiac source of emboli was indentified.   ------------------------------------------------------------------- Study data:   Study status:  Routine.  Procedure:  The patient reported no pain pre or post test. Transthoracic echocardiography. Image quality was adequate.          Transthoracic echocardiography.  M-mode, complete  2D, spectral Doppler, and color Doppler.  Birthdate:  Patient birthdate: [birth year].  Age:  Patient is 53 yr old.  Sex:  Gender: female.    BMI: 34.6 kg/m^2.  Blood pressure:     127/81  Patient status:  Inpatient.  Study date: Study date: 09/07/2016.  Study time: 10:52 AM.  Location:  Bedside.   -------------------------------------------------------------------   ------------------------------------------------------------------- Left ventricle:  The cavity size was normal. Wall thickness was normal. Systolic function was normal. The estimated ejection fraction was in the range of 60% to 65%. Wall motion was normal; there were no regional wall motion abnormalities. Doppler parameters are consistent with abnormal left ventricular relaxation (grade 1 diastolic dysfunction).   ------------------------------------------------------------------- Aortic valve:   Trileaflet; normal thickness leaflets. Mobility was not restricted.  Doppler:  Transvalvular velocity was within the normal range. There was no stenosis. There was no regurgitation. Peak velocity ratio of LVOT to aortic valve: 0.73. Valve area (Vmax): 2.28 cm^2. Indexed valve area (Vmax): 1.05 cm^2/m^2. Peak gradient (S): 7 mm Hg.   ------------------------------------------------------------------- Aorta:  Aortic root: The aortic root was normal in size.   ------------------------------------------------------------------- Mitral valve:   Structurally normal valve.   Mobility was not restricted.  Doppler:  Transvalvular velocity was within the normal range. There was no evidence for stenosis. There was trivial regurgitation.    Peak gradient (D): 3 mm Hg.   ------------------------------------------------------------------- Left atrium:  The atrium was normal in size.   ------------------------------------------------------------------- Right ventricle:  The cavity size was normal. Wall thickness was normal. Systolic function was normal.   ------------------------------------------------------------------- Pulmonic valve:    Doppler:  Transvalvular velocity was within the normal range. There was no evidence for stenosis.    ------------------------------------------------------------------- Tricuspid valve:   Structurally normal valve.    Doppler: Transvalvular velocity was within the normal range. There was trivial regurgitation.   ------------------------------------------------------------------- Pulmonary artery:   The main pulmonary artery was normal-sized. Systolic pressure was within the normal range.   ------------------------------------------------------------------- Right atrium:  The atrium was normal in size.   ------------------------------------------------------------------- Pericardium:  There was no pericardial effusion.   ------------------------------------------------------------------- Systemic veins: Inferior vena cava: The vessel was normal in size.   ------------------------------------------------------------------- Measurements   Left ventricle                           Value          Reference LV ID, ED, PLAX chordal          (L)     38.3  mm       43 - 52 LV ID, ES, PLAX chordal          (L)     21.5  mm       23 - 38 LV fx shortening, PLAX chordal           44    %        >=29 LV PW thickness, ED                      8.96  mm       ---------- IVS/LV PW ratio, ED                      1.21           <=1.3 LV e&', lateral  9.57  cm/s     ---------- LV E/e&', lateral                         8.77           ---------- LV e&', medial                            7.51  cm/s     ---------- LV E/e&', medial                          11.17          ---------- LV e&', average                           8.54  cm/s     ---------- LV E/e&', average                         9.82           ----------   Ventricular septum                       Value          Reference IVS thickness, ED                        10.8  mm       ----------   LVOT                                     Value          Reference LVOT ID, S                               20    mm        ---------- LVOT area                                3.14  cm^2     ---------- LVOT peak velocity, S                    97.5  cm/s     ----------   Aortic valve                             Value          Reference Aortic valve peak velocity, S            134   cm/s     ---------- Aortic peak gradient, S                  7     mm Hg    ---------- Velocity ratio, peak, LVOT/AV            0.73           ---------- Aortic valve area, peak velocity         2.28  cm^2     ---------- Aortic valve area/bsa, peak  1.05  cm^2/m^2 ---------- velocity   Aorta                                    Value          Reference Aortic root ID, ED                       29    mm       ----------   Left atrium                              Value          Reference LA ID, A-P, ES                           25    mm       ---------- LA ID/bsa, A-P                           1.16  cm/m^2   <=2.2 LA volume, S                             46.1  ml       ---------- LA volume/bsa, S                         21.3  ml/m^2   ---------- LA volume, ES, 1-p A4C                   40    ml       ---------- LA volume/bsa, ES, 1-p A4C               18.5  ml/m^2   ---------- LA volume, ES, 1-p A2C                   54.7  ml       ---------- LA volume/bsa, ES, 1-p A2C               25.3  ml/m^2   ----------   Mitral valve                             Value          Reference Mitral E-wave peak velocity              83.9  cm/s     ---------- Mitral A-wave peak velocity              99.9  cm/s     ---------- Mitral deceleration time                 211   ms       150 - 230 Mitral peak gradient, D                  3     mm Hg    ---------- Mitral E/A ratio, peak                   0.8            ----------   Right  atrium                             Value          Reference RA ID, S-I, ES, A4C                      44.4  mm       34 - 49 RA area, ES, A4C                         11.6  cm^2     8.3 - 19.5 RA volume, ES, A/L                        24.6  ml       ---------- RA volume/bsa, ES, A/L                   11.4  ml/m^2   ----------   Right ventricle                          Value          Reference RV ID, ED, PLAX                          30.6  mm       19 - 38 TAPSE                                    25.2  mm       ---------- RV s&', lateral, S                        15.7  cm/s     ----------   Pulmonic valve                           Value          Reference Pulmonic valve peak velocity, S          83.4  cm/s     ----------   Legend: (L)  and  (H)  mark values outside specified reference range.   ------------------------------------------------------------------- Prepared and Electronically Authenticated by   Deatrice Cage, MD 2018-06-04T13:39:31   TEE   ECHO TEE 09/08/2016   Narrative *[facility]* [address] [place], [place] [address] [phone]   ------------------------------------------------------------------- Transesophageal Echocardiography   Patient:    Idali, Lafever MR #:       [id number] Study Date: 09/08/2016 Gender:     F Age:        45 Height:     167.6 cm Weight:     97.1 kg BSA:        2.16 m^2 Pt. Status: Room:       107A   TISA Abigail Bernardino CHRISTELLA REFERRING    Dunn, Ryan M ATTENDING    Gouru, Aruna ADMITTING    Telford Ingle Md SONOGRAPHER  Christopher Furnace RDCS PERFORMING   [facility]   cc:   -------------------------------------------------------------------   ------------------------------------------------------------------- Indications:      Stroke 434.91.   ------------------------------------------------------------------- Study Conclusions   - Left ventricle:  The cavity size was normal. Wall thickness was normal. Systolic function was normal. - Aortic valve: No evidence of vegetation. There was trivial regurgitation. - Mitral valve: No evidence of vegetation. There was mild regurgitation. - Left atrium:  No evidence of thrombus in the atrial cavity or appendage. - Right atrium: No evidence of thrombus in the atrial cavity or appendage. No evidence of thrombus in the appendage. - Atrial septum: Agitated saline contrast study showed a very small right-to-left shunt, at baseline or with provocation. Though no PFO or ASD is clear identified, a tiny shunt cannot be excluded based on early appearance of a small number of bubbles in the left ventricle. - Tricuspid valve: No evidence of vegetation. - Pulmonic valve: No evidence of vegetation.   Impressions:   - Evidence of very small atrial level right-to-left shunt by bubble study without visualization of ASD or PFO.   ------------------------------------------------------------------- Study data:   Procedure:  Initial setup. The patient was brought to the laboratory. Surface ECG leads were monitored. Sedation. Conscious sedation was administered. Transesophageal echocardiography. Topical anesthesia was obtained using viscous lidocaine . A transesophageal probe was inserted by the attending cardiologist. Image quality was adequate.  Study completion:  The patient tolerated the procedure well. There were no complications. Diagnostic transesophageal echocardiography.  2D and color Doppler.  Birthdate:  Patient birthdate: Jan 12, 1971.  Age:  Patient is 53 yr old.  Sex:  Gender: female.    BMI: 34.6 kg/m^2.  Study date:  Study date: 09/08/2016. Study time: 10:02 AM.   -------------------------------------------------------------------   ------------------------------------------------------------------- Left ventricle:  The cavity size was normal. Wall thickness was normal. Systolic function was normal.   ------------------------------------------------------------------- Aortic valve:   Structurally normal valve.   Cusp separation was normal.  No evidence of vegetation.  Doppler:  There was trivial regurgitation.    ------------------------------------------------------------------- Aorta:  The aorta was normal, not dilated, and non-diseased.   ------------------------------------------------------------------- Mitral valve:   Structurally normal valve.   Leaflet separation was normal.  No evidence of vegetation.  Doppler:  There was mild regurgitation.   ------------------------------------------------------------------- Left atrium:  The atrium was normal in size.  No evidence of thrombus in the atrial cavity or appendage.   ------------------------------------------------------------------- Atrial septum:   Agitated saline contrast study showed a very small right-to-left shunt, at baseline or with provocation.   ------------------------------------------------------------------- Right ventricle:  The cavity size was normal. Systolic function was normal.   ------------------------------------------------------------------- Pulmonic valve:    Structurally normal valve.   Cusp separation was normal.  No evidence of vegetation.  Doppler:  There was mild regurgitation.   ------------------------------------------------------------------- Tricuspid valve:   Structurally normal valve.   Leaflet separation was normal.  No evidence of vegetation.  Doppler:  There was mild regurgitation.   ------------------------------------------------------------------- Right atrium:  The atrium was normal in size.  No evidence of thrombus in the atrial cavity or appendage.  No evidence of thrombus in the appendage. The appendage was normal sized.   ------------------------------------------------------------------- Post procedure conclusions Ascending Aorta:   - The aorta was normal, not dilated, and non-diseased.   ------------------------------------------------------------------- Measurements   Aorta                  Value Aortic root ID, ED     32    mm   Legend: (L)  and  (H)  mark values  outside specified reference range.   ------------------------------------------------------------------- Prepared and Electronically Authenticated by   Lonni Hanson, MD 2018-06-05T13:57:37   MONITORS   CARDIAC EVENT MONITOR  09/16/2016   Narrative  The patient was enrolled for 30 days.  The predominant rhythm was sinus.  Isolated PACs and PVCs were identified.  2 episodes of narrow complex tachycardia were identified on 10/02/16 with heart rates up to 230 bpm. While some portions appear regular, there are other periods of irregularity without clear P waves consistent with atrial fibrillation +/- atrial flutter or SVT.  No prolonged pauses were identified.  Patient triggered events corresponded to narrow complex tachycardia consistent with atrial fibrillation +/- atrial flutter/SVT.   Predominantly sinus rhythm with paroxysmal atrial fibrillation +/- atrial flutter/SVT.   Narrow complex tachycardia was discussed with the patient and treatment for paroxysmal atrial fibrillation initiated while the patient was still being monitored.   CT SCANS   CT CORONARY MORPH W/CTA COR W/SCORE 07/24/2022   Addendum 07/26/2022  7:26 PM ADDENDUM REPORT: 07/26/2022 19:23   EXAM: OVER-READ INTERPRETATION  CT CHEST   The following report is an over-read performed by radiologist Dr. Fonda Mom [facility] on 07/26/2022. This over-read does not include interpretation of cardiac or coronary anatomy or pathology. The coronary CTA interpretation by the cardiologist is attached.   COMPARISON:  None.   FINDINGS: Cardiovascular: Findings discussed in the body of the report. No incidental pulmonary artery filling defects identified to suggest PE   Mediastinum/Nodes: No suspicious adenopathy identified. Imaged mediastinal structures are unremarkable.   Lungs/Pleura: Dependent basilar subsegmental atelectasis. Lungs are otherwise clear. No pleural effusion or pneumothorax.    Upper Abdomen: No acute abnormality.   Musculoskeletal: No chest wall abnormality. No acute or significant osseous findings.   IMPRESSION: No significant extracardiac incidental findings identified.     Electronically Signed By: Fonda Field M.D. On: 07/26/2022 19:23   Narrative CLINICAL DATA:  68F with chest pain   EXAM: Cardiac/Coronary CTA   TECHNIQUE: The patient was scanned on a [employer].   FINDINGS: A 100 kV prospective scan was triggered in the descending thoracic aorta at 111 HU's. Axial non-contrast 3 mm slices were carried out through the heart. The data set was analyzed on a dedicated work station and scored using the Agatson method. Gantry rotation speed was 250 msecs and collimation was .6 mm. No beta blockade and 0.8 mg of sl NTG was given. The 3D data set was reconstructed in 5% intervals of the 35-75% of the R-R cycle. Phases were analyzed on a dedicated work station using MPR, MIP and VRT modes. The patient received 100 cc of contrast.   Coronary Arteries:  Normal coronary origin.  Right dominance.   RCA is a large dominant artery that gives rise to PDA and PLA. There is no plaque.   Left main is a large artery that gives rise to LAD and LCX arteries.   LAD is a large vessel. Noncalcified plaque in mid LAD causes minimal (0-24%) stenosis   LCX is a small, non-dominant artery.  There is no plaque.   Noncalcified plaque in ramus branch causes 0-24% stenosis   Other findings:   Left Ventricle: Normal size   Left Atrium: Mild enlargement   Pulmonary Veins: Normal configuration   Right Ventricle: Normal size   Right Atrium: Normal size   Cardiac valves: No calcifications   Thoracic aorta: Normal size   Pulmonary Arteries: Normal size   Systemic Veins: Normal drainage   Pericardium: Normal thickness   IMPRESSION: 1. Coronary calcium  score of 0.   2. Total plaque volume 50mm3 which is 63rd percentile for age  and  sex-matched controls (calcified plaque 41mm3; noncalcified plaque 8mm3). TPV is mild   3. Normal coronary origin with right dominance.   4. Nonobstructive CAD, with noncalcified plaque in mid LAD and ramus branch causing minimal (0-24%) stenosis   CAD-RADS 1. Minimal non-obstructive CAD (0-24%). Consider non-atherosclerotic causes of chest pain. Consider preventive therapy and risk factor modification.   Electronically Signed: By: Lonni Nanas M.D. On: 07/26/2022 14:06   Risk Assessment/Calculations {Does this patient have ATRIAL FIBRILLATION?[phone]} No BP recorded.  {Refresh Note OR Click here to enter BP  :1}***       Physical Exam VS:  LMP 05/07/2016        Wt Readings from Last 3 Encounters:  01/24/24 225 lb 3.2 oz (102.2 kg)  08/05/23 228 lb 6.4 oz (103.6 kg)  05/12/23 240 lb (108.9 kg)    GEN: Well nourished, well developed in no acute distress NECK: No JVD; No carotid bruits CARDIAC: ***RRR, no murmurs, rubs, gallops RESPIRATORY:  Clear to auscultation without rales, wheezing or rhonchi  ABDOMEN: Soft, non-tender, non-distended EXTREMITIES:  No edema; No deformity   ASSESSMENT AND PLAN Nonobstructive CAD/aortic atherosclerosis Hyperlipidemia with LDL goal less than 70 PAF/hypercoagulable state History of CVA PFO Tobacco marijuana use Hypothyroidism Morbid obesity    {Are you ordering a CV Procedure (e.g. stress test, cath, DCCV, TEE, etc)?   Press F2        :[id number]}  Dispo: ***  Signed, Orren LOISE Fabry, PA-C

## 2024-02-03 ENCOUNTER — Ambulatory Visit: Payer: MEDICAID | Attending: Internal Medicine | Admitting: Physician Assistant

## 2024-02-03 DIAGNOSIS — Q2112 Patent foramen ovale: Secondary | ICD-10-CM

## 2024-02-03 DIAGNOSIS — I10 Essential (primary) hypertension: Secondary | ICD-10-CM

## 2024-02-03 DIAGNOSIS — I700 Atherosclerosis of aorta: Secondary | ICD-10-CM

## 2024-02-03 DIAGNOSIS — Z8673 Personal history of transient ischemic attack (TIA), and cerebral infarction without residual deficits: Secondary | ICD-10-CM

## 2024-02-03 DIAGNOSIS — I2510 Atherosclerotic heart disease of native coronary artery without angina pectoris: Secondary | ICD-10-CM

## 2024-02-03 DIAGNOSIS — Z79899 Other long term (current) drug therapy: Secondary | ICD-10-CM

## 2024-02-03 DIAGNOSIS — I480 Paroxysmal atrial fibrillation: Secondary | ICD-10-CM

## 2024-02-03 DIAGNOSIS — R0609 Other forms of dyspnea: Secondary | ICD-10-CM

## 2024-02-03 DIAGNOSIS — E785 Hyperlipidemia, unspecified: Secondary | ICD-10-CM

## 2024-02-03 DIAGNOSIS — Z720 Tobacco use: Secondary | ICD-10-CM

## 2024-02-07 ENCOUNTER — Encounter: Payer: Self-pay | Admitting: Radiology

## 2024-02-16 LAB — SPECIMEN STATUS REPORT

## 2024-02-16 LAB — IRON: Iron: 16 ug/dL — ABNORMAL LOW (ref 27–159)

## 2024-02-16 LAB — FERRITIN: Ferritin: 9 ng/mL — ABNORMAL LOW (ref 15–150)

## 2024-02-23 ENCOUNTER — Ambulatory Visit (HOSPITAL_COMMUNITY)
Admission: EM | Admit: 2024-02-23 | Discharge: 2024-02-23 | Disposition: A | Discharge status: Home / Self Care | Payer: MEDICAID

## 2024-02-23 ENCOUNTER — Encounter (HOSPITAL_COMMUNITY): Payer: Self-pay | Admitting: Emergency Medicine

## 2024-02-23 DIAGNOSIS — W19XXXA Unspecified fall, initial encounter: Secondary | ICD-10-CM | POA: Diagnosis not present

## 2024-02-23 DIAGNOSIS — S0990XA Unspecified injury of head, initial encounter: Secondary | ICD-10-CM

## 2024-02-23 DIAGNOSIS — M549 Dorsalgia, unspecified: Secondary | ICD-10-CM

## 2024-02-23 DIAGNOSIS — M542 Cervicalgia: Secondary | ICD-10-CM | POA: Diagnosis not present

## 2024-02-23 NOTE — ED Triage Notes (Signed)
 Pt reports went in dark room to check on kids and fell on way out. Pt c/o back and neck pain. Took tylenol  last night.  Reports she had surgery in May

## 2024-02-23 NOTE — ED Provider Notes (Signed)
[facility]    CSN: [id number] Arrival date & time: 02/23/24  0815      History   Chief Complaint Chief Complaint  Patient presents with   Fall    HPI Margaret [name] is a 53 y.o. female.   Patient presents for further evaluation after a fall that occurred last night.  Patient reports that she was going into the room to check on her children when she fell on the way out.  She states that she fell backwards landing on her back and did hit her head.  She is not sure how she fell but reports that she was off balance given that she recently had ankle surgery.  She does take Eliquis .  She is reporting some dizziness.  Denies headache.  She has neck pain and back pain that extends all the way down to the lower back.   Fall    Past Medical History:  Diagnosis Date   Anxiety    Arthritis    Asthma    controlled   CVA (cerebral vascular accident) (HCC)    a. 09/2016   Depression    Dysrhythmia    Headache    Hypertension    Hypothyroidism    Morbid obesity (HCC)    PAF (paroxysmal atrial fibrillation) (HCC) 09/2016   a. 30-day event monitor 6/18: predominant rhythm of sinus with isolated PACs and PVCs.  Two narrow complex tachycardia episodes concerning for A. Fib +/-atrial flutter or SVT.  No prolonged pauses; b. CHADS2VASc = 3 (stroke x 2, female)--> Eliquis .   PFO (patent foramen ovale)    a. TTE 6/18: EF of 60 to 65%, normal wall motion, grade 1 diastolic dysfunction; b. TEE 6/18: evidence of a very small atrial level right to left shunt by bubble study without visualization of ASD or PFO   Substance abuse (HCC)    Past use of cocaine    Tobacco abuse     Patient Active Problem List   Diagnosis Date Noted   COVID-19 vaccination declined 08/15/2023   Herpes zoster vaccination declined 08/15/2023   Encounter for screening mammogram for malignant neoplasm of breast 08/15/2023   Cervicalgia 08/15/2023   Mass of right lower extremity 08/15/2023   Hyperlipidemia  LDL goal <70 08/15/2023   Screening for colon cancer 08/15/2023   Coronary artery disease involving native coronary artery of native heart without angina pectoris 08/05/2023   Aortic atherosclerosis 09/03/2021   Adjustment disorder with mixed anxiety and depressed mood 08/05/2021   Insomnia, unspecified 08/05/2021   Right sided weakness 10/24/2020   Essential hypertension 10/24/2020   Constipation 06/27/2020   Fatigue 04/05/2020   Encounter to establish care with new doctor 01/22/2020   Encounter for hepatitis C screening test for low risk patient 01/22/2020   Screening examination for sexually transmitted disease 01/22/2020   Skin tag 01/22/2020   Nonintractable headache 01/22/2020   Status post laparoscopic cholecystectomy 09/28/2019   Dyspnea on exertion 08/15/2018   Multinodular goiter 04/28/2017   Paroxysmal atrial fibrillation (HCC) 10/29/2016   PFO (patent foramen ovale) 09/23/2016   Hyperlipidemia 09/17/2016   IFG (impaired fasting glucose) 09/17/2016   History of stroke 09/06/2016   Myopia 12/20/2014   Abdominal pain 12/20/2014   Depression 12/20/2014   Right arm pain 11/21/2014   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 10/19/2014   Hypothyroid 10/18/2014   Abnormal CBC 10/18/2014   Tobacco abuse 10/18/2014   Previous known suicide attempt 04/20/2014    Past Surgical  History:  Procedure Laterality Date   FRACTURE SURGERY     TEE WITHOUT CARDIOVERSION N/A 09/08/2016   Procedure: TRANSESOPHAGEAL ECHOCARDIOGRAM (TEE);  Surgeon: Mady Bruckner, MD;  Location: [facility];  Service: Cardiovascular;  Laterality: N/A;   XI ROBOTIC ASSISTED VENTRAL HERNIA N/A 09/13/2019   Procedure: XI ROBOTIC Cholecystectomy, with primary non-mesh repair of EPIGATRIC HERNIA;  Surgeon: Lane Shope, MD;  Location: [facility];  Service: General;  Laterality: N/A;    OB History   No obstetric history on file.      Home Medications    Prior to Admission  medications   Medication Sig Start Date End Date Taking? Authorizing Provider  apixaban  (ELIQUIS ) 5 MG TABS tablet Take 1 tablet (5 mg total) by mouth 2 (two) times daily. 08/05/23 02/01/24  Georgina Speaks, FNP  atorvastatin  (LIPITOR) 40 MG tablet Take 1 tablet (40 mg total) by mouth daily. 08/05/23   Georgina Speaks, FNP  ferrous gluconate  (FERGON) 324 MG tablet Take 1 tablet (324 mg total) by mouth 2 (two) times daily with a meal. 01/25/24   Tysinger, Alm RAMAN, PA-C  levothyroxine  (SYNTHROID ) 50 MCG tablet Take 1 tablet (50 mcg total) by mouth daily. 01/25/24 01/24/25  Tysinger, Alm RAMAN, PA-C  meloxicam  (MOBIC ) 15 MG tablet Take 1 tablet (15 mg total) by mouth daily. 01/07/24   Hyatt, Max T, DPM  metoprolol  tartrate (LOPRESSOR ) 25 MG tablet Take 1 tablet (25 mg total) by mouth 2 (two) times daily. Unsure of dose 08/05/23   Georgina Speaks, FNP  ondansetron  (ZOFRAN -ODT) 4 MG disintegrating tablet Take 1 tablet (4 mg total) by mouth every 8 (eight) hours as needed for nausea or vomiting. 01/24/24   Tysinger, Alm RAMAN, PA-C    Family History Family History  Problem Relation Age of Onset   Stroke Father    Hypertension Daughter    Migraines Daughter    Cervical cancer Maternal Grandmother    Heart disease Maternal Grandfather    Stroke Maternal Grandfather     Social History Social History   Tobacco Use   Smoking status: Every Day    Current packs/day: 10.00    Types: Cigarettes   Smokeless tobacco: Former    Types: Snuff   Tobacco comments:    trying to quit; going to start using patches;   Vaping Use   Vaping status: Never Used  Substance Use Topics   Alcohol use: Yes    Comment: on occasion   Drug use: Yes    Types: Marijuana     Allergies   Patient has no known allergies.   Review of Systems Review of Systems Per HPI  Physical Exam Triage Vital Signs ED Triage Vitals  Encounter Vitals Group     BP 02/23/24 0842 (!) 137/94     Girls Systolic BP Percentile --      Girls  Diastolic BP Percentile --      Boys Systolic BP Percentile --      Boys Diastolic BP Percentile --      Pulse Rate 02/23/24 0842 76     Resp 02/23/24 0842 17     Temp 02/23/24 0842 98.1 F (36.7 C)     Temp Source 02/23/24 0842 Oral     SpO2 02/23/24 0842 99 %     Weight --      Height --      Head Circumference --      Peak Flow --      Pain Score 02/23/24 0841 8  Pain Loc --      Pain Education --      Exclude from Growth Chart --    No data found.  Updated Vital Signs BP (!) 137/94 (BP Location: Left Arm)   Pulse 76   Temp 98.1 F (36.7 C) (Oral)   Resp 17   LMP 05/07/2016   SpO2 99%   Visual Acuity Right Eye Distance:   Left Eye Distance:   Bilateral Distance:    Right Eye Near:   Left Eye Near:    Bilateral Near:     Physical Exam Constitutional:      General: She is not in acute distress.    Appearance: Normal appearance. She is not toxic-appearing or diaphoretic.  HENT:     Head: Normocephalic and atraumatic.     Comments: No obvious swelling, hematoma, lacerations, abrasions noted. Eyes:     Extraocular Movements: Extraocular movements intact.     Conjunctiva/sclera: Conjunctivae normal.  Neck:     Comments: Tenderness to palpation to mid cervical area.  No crepitus or step-off noted.  No discoloration or swelling noted.  No lacerations or abrasions.  Patient has tenderness to palpation across bilateral posterior shoulders as well.  Tenderness to palpation to mid to lower lumbar region.  No discoloration, swelling, lacerations, abrasions noted throughout back.  Movement of the arms elicits neck and back pain.  Grip strength is 5/5. Pulmonary:     Effort: Pulmonary effort is normal.  Neurological:     General: No focal deficit present.     Mental Status: She is alert and oriented to person, place, and time. Mental status is at baseline.     Cranial Nerves: Cranial nerves 2-12 are intact.     Sensory: Sensation is intact.     Motor: Motor function is  intact.     Coordination: Coordination is intact.     Gait: Gait is intact.  Psychiatric:        Mood and Affect: Mood normal.        Behavior: Behavior normal.        Thought Content: Thought content normal.        Judgment: Judgment normal.      UC Treatments / Results  Labs (all labs ordered are listed, but only abnormal results are displayed) Labs Reviewed - No data to display  EKG   Radiology No results found.  Procedures Procedures (including critical care time)  Medications Ordered in UC Medications - No data to display  Initial Impression / Assessment and Plan / UC Course  I have reviewed the triage vital signs and the nursing notes.  Pertinent labs & imaging results that were available during my care of the patient were reviewed by me and considered in my medical decision making (see chart for details).     Given neck, back, head injury while patient taking Eliquis , patient was advised to proceed to the emergency department for further evaluation and management.  She was agreeable with this plan and wished to self transport.  Neuro exam was normal which is reassuring. Patient left via self transport to go to the emergency department. Final Clinical Impressions(s) / UC Diagnoses   Final diagnoses:  Fall, initial encounter  Injury of head, initial encounter  Neck pain  Acute midline back pain, unspecified back location   Discharge Instructions   None    ED Prescriptions   None    PDMP not reviewed this encounter.   Hazen Darryle BRAVO, [place] 02/23/24  0915  

## 2024-02-23 NOTE — ED Notes (Signed)
 Patient is being discharged from the Urgent Care and sent to the Emergency Department via POV . Per [facility], FNP, patient is in need of higher level of care due to fall, head injury. Patient is aware and verbalizes understanding of plan of care.  Vitals:   02/23/24 0842  BP: (!) 137/94  Pulse: 76  Resp: 17  Temp: 98.1 F (36.7 C)  SpO2: 99%

## 2024-03-13 ENCOUNTER — Other Ambulatory Visit: Payer: Self-pay | Admitting: Medical

## 2024-03-13 ENCOUNTER — Telehealth: Payer: Self-pay | Admitting: Nurse Practitioner

## 2024-03-13 NOTE — Telephone Encounter (Unsigned)
 Copied from CRM [id number]. Topic: Clinical - Medication Refill >> Mar 13, 2024  9:56 AM Tanazia G wrote: Medication:  levothyroxine  (SYNTHROID ) 50 MCG tablet Patient is also requesting another medication that she can not think of the name but it removes the swelling and fluid from her legs.  Has the patient contacted their pharmacy? Yes (Agent: If no, request that the patient contact the pharmacy for the refill. If patient does not wish to contact the pharmacy document the reason why and proceed with request.) (Agent: If yes, when and what did the pharmacy advise?)  This is the patient's preferred pharmacy:  [id number] [address], [place] [address] AT [facility] OF [address] [place] [address] Phone: [phone] Fax: [phone]   Is this the correct pharmacy for this prescription? Yes If no, delete pharmacy and type the correct one.   Has the prescription been filled recently? Yes  Is the patient out of the medication? Yes  Has the patient been seen for an appointment in the last year OR does the patient have an upcoming appointment? Yes  Can we respond through MyChart? Yes  Agent: Please be advised that Rx refills may take up to 3 business days. We ask that you follow-up with your pharmacy.

## 2024-03-15 ENCOUNTER — Other Ambulatory Visit: Payer: Self-pay | Admitting: Medical

## 2024-03-15 ENCOUNTER — Other Ambulatory Visit: Payer: Self-pay | Admitting: Nurse Practitioner

## 2024-03-15 NOTE — Telephone Encounter (Signed)
 Gaines,  We saw patient one time I am not sure why we keep getting a refill on this. Pt was to follow-up with pcp for anymore refills.

## 2024-03-15 NOTE — Telephone Encounter (Signed)
Needs to come in for labs

## 2024-03-15 NOTE — Telephone Encounter (Signed)
 Okay I am not sure why but she has an appt with me in January, I will send the refill.

## 2024-04-05 ENCOUNTER — Ambulatory Visit: Payer: MEDICAID | Attending: Internal Medicine | Admitting: Internal Medicine

## 2024-04-05 NOTE — Progress Notes (Deleted)
" °  Cardiology Office Note:  .   Date:  04/05/2024  ID:  Margaret [name], DOB 03-30-1971, MRN [id number] PCP: Georgina Speaks, FNP  [facility] Providers Cardiologist:  Lonni Hanson, MD { Click to update primary MD,subspecialty MD or APP then REFRESH:1}    History of Present Illness: .   Margaret [name] is a 53 y.o. female with history of paroxysmal atrial fibrillation, patent foramen ovale, stroke nonobstructive coronary artery disease, hypothyroidism, asthma, remote cocaine/marijuana use, and tobacco abuse, who presents for follow-up of atrial fibrillation and nonobstructive coronary artery disease.  She was last seen in our office in 12/2022 by Reche Finder, NP.  At that time she was most concerned about right shoulder pain that was felt to be musculoskeletal in nature.  Given that preceding coronary CTA showed mild plaquing of the LAD and ramus intermedius, she was restarted on atorvastatin  40 mg daily.  ROS: See HPI  Studies Reviewed: .        *** Risk Assessment/Calculations:   {Does this patient have ATRIAL FIBRILLATION?:[phone]} No BP recorded.  {Refresh Note OR Click here to enter BP  :1}***       Physical Exam:   VS:  LMP 05/07/2016    Wt Readings from Last 3 Encounters:  01/24/24 225 lb 3.2 oz (102.2 kg)  08/05/23 228 lb 6.4 oz (103.6 kg)  05/12/23 240 lb (108.9 kg)    General:  NAD. Neck: No JVD or HJR. Lungs: Clear to auscultation bilaterally without wheezes or crackles. Heart: Regular rate and rhythm without murmurs, rubs, or gallops. Abdomen: Soft, nontender, nondistended. Extremities: No lower extremity edema.  ASSESSMENT AND PLAN: .    ***    {Are you ordering a CV Procedure (e.g. stress test, cath, DCCV, TEE, etc)?   Press F2        :[phone]}  Dispo: ***  Signed, Lonni Hanson, MD  "

## 2024-04-10 ENCOUNTER — Ambulatory Visit: Payer: Self-pay | Admitting: Nurse Practitioner

## 2024-04-10 ENCOUNTER — Telehealth: Payer: Self-pay

## 2024-04-10 ENCOUNTER — Encounter: Payer: Self-pay | Admitting: Nurse Practitioner

## 2024-04-10 ENCOUNTER — Telehealth: Admitting: Nurse Practitioner

## 2024-04-10 DIAGNOSIS — I480 Paroxysmal atrial fibrillation: Secondary | ICD-10-CM | POA: Diagnosis not present

## 2024-04-10 DIAGNOSIS — I700 Atherosclerosis of aorta: Secondary | ICD-10-CM

## 2024-04-10 DIAGNOSIS — R5383 Other fatigue: Secondary | ICD-10-CM | POA: Diagnosis not present

## 2024-04-10 DIAGNOSIS — I2510 Atherosclerotic heart disease of native coronary artery without angina pectoris: Secondary | ICD-10-CM

## 2024-04-10 DIAGNOSIS — K5904 Chronic idiopathic constipation: Secondary | ICD-10-CM

## 2024-04-10 DIAGNOSIS — E039 Hypothyroidism, unspecified: Secondary | ICD-10-CM

## 2024-04-10 DIAGNOSIS — E042 Nontoxic multinodular goiter: Secondary | ICD-10-CM | POA: Diagnosis not present

## 2024-04-10 DIAGNOSIS — Z1211 Encounter for screening for malignant neoplasm of colon: Secondary | ICD-10-CM

## 2024-04-10 DIAGNOSIS — E785 Hyperlipidemia, unspecified: Secondary | ICD-10-CM

## 2024-04-10 DIAGNOSIS — D508 Other iron deficiency anemias: Secondary | ICD-10-CM | POA: Insufficient documentation

## 2024-04-10 DIAGNOSIS — I10 Essential (primary) hypertension: Secondary | ICD-10-CM | POA: Diagnosis not present

## 2024-04-10 MED ORDER — METOPROLOL TARTRATE 25 MG PO TABS: 25.0000 mg | ORAL_TABLET | Freq: Two times a day (BID) | ORAL | 1 refills | Status: DC

## 2024-04-10 MED ORDER — APIXABAN 5 MG PO TABS: 5.0000 mg | ORAL_TABLET | Freq: Two times a day (BID) | ORAL | 1 refills | Status: DC

## 2024-04-10 NOTE — Assessment & Plan Note (Signed)
 Will consider ordering a thyroid  ultrasound pending thyroid  level results.

## 2024-04-10 NOTE — Progress Notes (Signed)
 "  Virtual Visit via Video Note  I,Jameka J Llittleton, CMA,acting as a scribe for [name], [name],have documented all relevant documentation on the behalf of [name], FNP,as directed by  [name], FNP while in the presence of [name], [name]  I connected with Margaret [name] on 04/10/2024 at  2:40 PM EST by a video enabled telemedicine application and verified that I am speaking with the correct person using two identifiers.  Patient Location: Home Provider Location: Home Office  I discussed the limitations, risks, security, and privacy concerns of performing an evaluation and management service by video and the availability of in person appointments. I also discussed with the patient that there may be a patient responsible charge related to this service. The patient expressed understanding and agreed to proceed.  Subjective: PCP: Ada Gaines, FNP  Chief Complaint  Patient presents with   Referral    Patient would like a referral to the GI for her colonoscopy. She reports she has been having acid reflux.    itching    Patient reports she has been feeling really itchy. She reports she is itching on her head and all over. She also reported her hair is thinning out. She has tried a shampoo over the counter to see if itll help with the itching.   She reports she had hernia surgery in 2020 and reports she has been feeling sluggish, constipation (not emptying completely) - she has been having these symptoms for 5 years. She is unsure if the itching is related to her job. She had an underactive thyroid  back in October and did not have a f/u after attempting to get her in. She should be taking levothyroxine  50 mcg daily.      ROS: Per HPI Current Medications[1]  Observations/Objective: Today's Vitals   Physical Exam Vitals and nursing note reviewed.  Constitutional:      General: She is not in acute distress.    Appearance: Normal appearance. She is obese.  Pulmonary:      Effort: Pulmonary effort is normal. No respiratory distress.  Skin:    Capillary Refill: Capillary refill takes less than 2 seconds.  Neurological:     General: No focal deficit present.     Mental Status: She is alert and oriented to person, place, and time.     Cranial Nerves: No cranial nerve deficit.  Psychiatric:        Mood and Affect: Mood normal.        Behavior: Behavior normal.        Thought Content: Thought content normal.        Judgment: Judgment normal.     Assessment and Plan: Screening for colon cancer -     Ambulatory referral to Gastroenterology  Hyperlipidemia LDL goal <70 Assessment & Plan: Will check lipid panel. Continue    Aortic atherosclerosis Assessment & Plan: Continue stating and follow up with cardiology   Hypothyroidism, unspecified type Assessment & Plan: Will check thyroid  levels and make adjustments to medications pending results. Currently on levothyroxine  50 mcg.    Other fatigue Assessment & Plan: Will check for metabolic causes.   Orders: -     TSH + free T4; Future -     Iron, TIBC and Ferritin Panel; Future -     Vitamin B12; Future  Coronary artery disease involving native coronary artery of native heart without angina pectoris Assessment & Plan: Continue f/u with cardiology   Paroxysmal atrial fibrillation [facility]) Assessment & Plan:  Continue eliquis   Orders: -     Apixaban ; Take 1 tablet (5 mg total) by mouth 2 (two) times daily.  Dispense: 180 tablet; Refill: 1  Essential hypertension Assessment & Plan: Continue blood pressure medications.   Orders: -     Metoprolol  Tartrate; Take 1 tablet (25 mg total) by mouth 2 (two) times daily. Unsure of dose  Dispense: 180 tablet; Refill: 1  Iron deficiency anemia secondary to inadequate dietary iron intake Assessment & Plan: Had a low iron in October and was given iron supplements, will recheck levels as this can cause fatigue and sluggishness.    Multinodular  goiter Assessment & Plan: Will consider ordering a thyroid  ultrasound pending thyroid  level results.    Chronic idiopathic constipation Assessment & Plan: Discussed with the patient about doing a clearance advise if she plans to do a colon cleanse that she can get that from a [employer] store and do a colon cleanse or liver detox.  Also advised her a natural way to detox is to limit the intake of carbohydrates and sweets and just eat fish chicken and vegetables for little while.  I have also referred her to GI she is due for a colonoscopy.  She did not mention anything about reflux during the call     Follow Up Instructions: Return if symptoms worsen or fail to improve.   I discussed the assessment and treatment plan with the patient. The patient was provided an opportunity to ask questions, and all were answered. The patient agreed with the plan and demonstrated an understanding of the instructions.   The patient was advised to call back or seek an in-person evaluation if the symptoms worsen or if the condition fails to improve as anticipated.  The above assessment and management plan was discussed with the patient. The patient verbalized understanding of and has agreed to the management plan.   LILLETTE [name], FNP, have reviewed all documentation for this visit. The documentation on 04/10/2024 for the exam, diagnosis, procedures, and orders are all accurate and complete.      [1]  Current Outpatient Medications:    atorvastatin  (LIPITOR) 40 MG tablet, Take 1 tablet (40 mg total) by mouth daily., Disp: 90 tablet, Rfl: 3   ferrous gluconate  (FERGON) 324 MG tablet, Take 1 tablet (324 mg total) by mouth 2 (two) times daily with a meal., Disp: 60 tablet, Rfl: 2   levothyroxine  (SYNTHROID ) 50 MCG tablet, TAKE 1 TABLET(50 MCG) BY MOUTH DAILY, Disp: 30 tablet, Rfl: 0   meloxicam  (MOBIC ) 15 MG tablet, Take 1 tablet (15 mg total) by mouth daily., Disp: 90 tablet, Rfl: 3   apixaban  (ELIQUIS ) 5  MG TABS tablet, Take 1 tablet (5 mg total) by mouth 2 (two) times daily., Disp: 180 tablet, Rfl: 1   metoprolol  tartrate (LOPRESSOR ) 25 MG tablet, Take 1 tablet (25 mg total) by mouth 2 (two) times daily. Unsure of dose, Disp: 180 tablet, Rfl: 1   ondansetron  (ZOFRAN -ODT) 4 MG disintegrating tablet, Take 1 tablet (4 mg total) by mouth every 8 (eight) hours as needed for nausea or vomiting. (Patient not taking: Reported on 04/10/2024), Disp: 20 tablet, Rfl: 0  "

## 2024-04-10 NOTE — Assessment & Plan Note (Signed)
 Will check lipid panel. Continue

## 2024-04-10 NOTE — Assessment & Plan Note (Signed)
 Continue blood pressure medications

## 2024-04-10 NOTE — Telephone Encounter (Signed)
 Called patient to see if she is ok with virtual visit due to JM out sick. No answer but left message on voicemail

## 2024-04-10 NOTE — Assessment & Plan Note (Signed)
 Will check thyroid  levels and make adjustments to medications pending results. Currently on levothyroxine  50 mcg.

## 2024-04-10 NOTE — Assessment & Plan Note (Signed)
"-   Continue eliquis   "

## 2024-04-10 NOTE — Assessment & Plan Note (Signed)
 Discussed with the patient about doing a clearance advise if she plans to do a colon cleanse that she can get that from a [employer] store and do a colon cleanse or liver detox.  Also advised her a natural way to detox is to limit the intake of carbohydrates and sweets and just eat fish chicken and vegetables for little while.  I have also referred her to GI she is due for a colonoscopy.  She did not mention anything about reflux during the call

## 2024-04-10 NOTE — Telephone Encounter (Signed)
 Patient was called multiple times and we left her a vm. We were trying to do patient's appt virtually. YL,RMA

## 2024-04-10 NOTE — Assessment & Plan Note (Signed)
 Continue stating and follow up with cardiology

## 2024-04-10 NOTE — Assessment & Plan Note (Signed)
 Had a low iron in October and was given iron supplements, will recheck levels as this can cause fatigue and sluggishness.

## 2024-04-10 NOTE — Assessment & Plan Note (Signed)
 Continue f/u with cardiology.

## 2024-04-10 NOTE — Assessment & Plan Note (Signed)
Will check for metabolic causes.

## 2024-04-11 ENCOUNTER — Other Ambulatory Visit: Payer: Self-pay

## 2024-04-12 NOTE — Progress Notes (Signed)
" °  Cardiology Office Note   Date:  04/14/2024  ID:  Margaret [name], DOB 1970/07/15, MRN [id number] PCP: Georgina Speaks, FNP  [facility] Providers Cardiologist:  Lonni Hanson, MD     PMH PAF on chronic anticoagulation PFO Hypothyroidism Remote history of polysubstance abuse Tobacco abuse Aortic atherosclerosis Hyperlipidemia Nonobstructive coronary artery disease CCTA 07/2022 with calcium  score of 0 Noted total plaque volume of 22 mm (63rd percentile) 0-24% stenosis in mid LAD and ramus branch  Was hospitalized 09/2016 due to slurred speech and right frontal lobe stroke.  Echo with normal LVEF, grade 1 diastolic dysfunction.  Carotid duplex < 50% bilateral internal carotid artery stenoses.  TEE with small atrial level right-to-left shunt consistent with PFO.  Followed by 30-day monitor with 2 episodes of narrow complex tachycardia concerning for atrial flutter/fibrillation.  She was placed on Xarelto  and beta blocker therapy and was later switched from Xarelto  to Eliquis .  ED visit 08/22/2019 for neck pain and intermittent paresthesias to the left arm with CT head unremarkable and reproducible chest pain atypical for angina.  Last cardiology clinic visit was 12/17/2022 with Reche Finder, NP.  She requested an appointment due to sharp pain in her right arm radiating to her back.  She was accompanied by her daughter.  She had been taking aspirin  with some improvement in pain described as sharp.  It does not worsen with movement and had been ongoing for approximately 1 to 2 weeks.  Symptoms were not felt to be cardiac in nature and she was advised to take ibuprofen  600 mg twice daily for 5 days and apply heat.  Recommended to follow-up with PCP or orthopedic walk-in clinic if symptoms persisted.  Advised to resume atorvastatin  40 mg daily for management of hyperlipidemia.  She was feeling lethargic on metoprolol  so it was discontinued given no recent tachypalpitations.  Smoking cessation  encouraged.  History of Present Illness Margaret [name] is a 54 y.o. female   ROS:   Studies Reviewed        No results found for: LIPOA  Risk Assessment/Calculations  CHA2DS2-VASc Score = 5   This indicates a 7.2% annual risk of stroke. The patient's score is based upon: CHF History: 0 HTN History: 1 Diabetes History: 0 Stroke History: 2 Vascular Disease History: 1 Age Score: 0 Gender Score: 1         Physical Exam VS:  LMP 05/07/2016    Wt Readings from Last 3 Encounters:  01/24/24 225 lb 3.2 oz (102.2 kg)  08/05/23 228 lb 6.4 oz (103.6 kg)  05/12/23 240 lb (108.9 kg)    GEN: Well nourished, well developed in no acute distress NECK: No JVD; No carotid bruits CARDIAC: RRR, no murmurs, rubs, gallops RESPIRATORY:  Clear to auscultation without rales, wheezing or rhonchi  ABDOMEN: Soft, non-tender, non-distended EXTREMITIES:  No edema; No deformity   ASSESSMENT AND PLAN       Dispo:   Signed, Rosaline Bane, NP-C This encounter was created in error - please disregard. "

## 2024-04-13 ENCOUNTER — Ambulatory Visit: Admitting: Podiatry

## 2024-04-13 ENCOUNTER — Telehealth: Payer: Self-pay

## 2024-04-13 ENCOUNTER — Encounter (HOSPITAL_BASED_OUTPATIENT_CLINIC_OR_DEPARTMENT_OTHER): Admitting: Nurse Practitioner

## 2024-04-13 ENCOUNTER — Encounter (HOSPITAL_BASED_OUTPATIENT_CLINIC_OR_DEPARTMENT_OTHER): Payer: Self-pay

## 2024-04-13 NOTE — Telephone Encounter (Signed)
 Patient called and left message on nurse triage line needing to reschedule her appointment from today.

## 2024-04-18 NOTE — Progress Notes (Unsigned)
 Margaret [name], CMA,acting as a neurosurgeon for Margaret Ada, [name],have documented all relevant documentation on the behalf of Margaret Ada, FNP,as directed by  Margaret Ada, FNP while in the presence of Margaret Ada, [name]  Subjective:  Patient ID: Margaret [name] , female    DOB: 01/10/1971 , 54 y.o.   MRN: [id number]  No chief complaint on file.   HPI  HPI   Past Medical History:  Diagnosis Date   Anxiety    Arthritis    Asthma    controlled   CVA (cerebral vascular accident) (HCC)    a. 09/2016   Depression    Dysrhythmia    Headache    Hypertension    Hypothyroidism    Morbid obesity (HCC)    PAF (paroxysmal atrial fibrillation) (HCC) 09/2016   a. 30-day event monitor 6/18: predominant rhythm of sinus with isolated PACs and PVCs.  Two narrow complex tachycardia episodes concerning for A. Fib +/-atrial flutter or SVT.  No prolonged pauses; b. CHADS2VASc = 3 (stroke x 2, female)--> Eliquis .   PFO (patent foramen ovale)    a. TTE 6/18: EF of 60 to 65%, normal wall motion, grade 1 diastolic dysfunction; b. TEE 6/18: evidence of a very small atrial level right to left shunt by bubble study without visualization of ASD or PFO   Substance abuse (HCC)    Past use of cocaine    Tobacco abuse      Family History  Problem Relation Age of Onset   Stroke Father    Hypertension Daughter    Migraines Daughter    Cervical cancer Maternal Grandmother    Heart disease Maternal Grandfather    Stroke Maternal Grandfather     Current Medications[1]   Allergies[2]   Review of Systems   There were no vitals filed for this visit. There is no height or weight on file to calculate BMI.  Wt Readings from Last 3 Encounters:  01/24/24 225 lb 3.2 oz (102.2 kg)  08/05/23 228 lb 6.4 oz (103.6 kg)  05/12/23 240 lb (108.9 kg)    The ASCVD Risk score (Arnett DK, et al., 2019) failed to calculate for the following reasons:   Risk score cannot be calculated because patient has a medical history  suggesting prior/existing ASCVD   * - Cholesterol units were assumed  Objective:  Physical Exam      Assessment And Plan:   Assessment & Plan   No orders of the defined types were placed in this encounter.    No follow-ups on file.  Patient was given opportunity to ask questions. Patient verbalized understanding of the plan and was able to repeat key elements of the plan. All questions were answered to their satisfaction.    Margaret Margaret Ada, FNP, have reviewed all documentation for this visit. The documentation on 04/18/2024 for the exam, diagnosis, procedures, and orders are all accurate and complete.   IF YOU HAVE BEEN REFERRED TO A SPECIALIST, IT MAY TAKE 1-2 WEEKS TO SCHEDULE/PROCESS THE REFERRAL. IF YOU HAVE NOT HEARD FROM US /SPECIALIST IN TWO WEEKS, PLEASE GIVE US  A CALL AT [phone] X [phone].     [1]  Current Outpatient Medications:    apixaban  (ELIQUIS ) 5 MG TABS tablet, Take 1 tablet (5 mg total) by mouth 2 (two) times daily., Disp: 180 tablet, Rfl: 1   atorvastatin  (LIPITOR) 40 MG tablet, Take 1 tablet (40 mg total) by mouth daily., Disp: 90 tablet, Rfl: 3   ferrous gluconate  (FERGON) 324 MG  tablet, Take 1 tablet (324 mg total) by mouth 2 (two) times daily with a meal., Disp: 60 tablet, Rfl: 2   levothyroxine  (SYNTHROID ) 50 MCG tablet, TAKE 1 TABLET(50 MCG) BY MOUTH DAILY, Disp: 30 tablet, Rfl: 0   meloxicam  (MOBIC ) 15 MG tablet, Take 1 tablet (15 mg total) by mouth daily., Disp: 90 tablet, Rfl: 3   metoprolol  tartrate (LOPRESSOR ) 25 MG tablet, Take 1 tablet (25 mg total) by mouth 2 (two) times daily. Unsure of dose, Disp: 180 tablet, Rfl: 1   ondansetron  (ZOFRAN -ODT) 4 MG disintegrating tablet, Take 1 tablet (4 mg total) by mouth every 8 (eight) hours as needed for nausea or vomiting. (Patient not taking: Reported on 04/10/2024), Disp: 20 tablet, Rfl: 0 [2] No Known Allergies

## 2024-04-19 ENCOUNTER — Ambulatory Visit: Payer: Self-pay

## 2024-04-19 ENCOUNTER — Telehealth: Payer: Self-pay

## 2024-04-19 NOTE — Telephone Encounter (Signed)
 Called patient 2x regarding her appointment sent mychart message with information.

## 2024-04-20 ENCOUNTER — Other Ambulatory Visit

## 2024-04-20 DIAGNOSIS — R5383 Other fatigue: Secondary | ICD-10-CM

## 2024-04-20 DIAGNOSIS — Z7901 Long term (current) use of anticoagulants: Secondary | ICD-10-CM

## 2024-04-20 DIAGNOSIS — Z113 Encounter for screening for infections with a predominantly sexual mode of transmission: Secondary | ICD-10-CM

## 2024-04-21 LAB — IRON,TIBC AND FERRITIN PANEL
Ferritin: 14 ng/mL — ABNORMAL LOW (ref 15–150)
Iron Saturation: 4 % — CL (ref 15–55)
Iron: 14 ug/dL — ABNORMAL LOW (ref 27–159)
Total Iron Binding Capacity: 313 ug/dL (ref 250–450)
UIBC: 299 ug/dL (ref 131–425)

## 2024-04-21 LAB — HIV ANTIBODY (ROUTINE TESTING W REFLEX): HIV Screen 4th Generation wRfx: NONREACTIVE

## 2024-04-21 LAB — TSH+FREE T4
Free T4: 1.17 ng/dL (ref 0.82–1.77)
TSH: 6.82 u[IU]/mL — ABNORMAL HIGH (ref 0.450–4.500)

## 2024-04-21 LAB — VITAMIN B12: Vitamin B-12: 617 pg/mL (ref 232–1245)

## 2024-04-21 LAB — PROTIME-INR
INR: 1 (ref 0.9–1.2)
Prothrombin Time: 11.3 s (ref 9.1–12.0)

## 2024-05-02 ENCOUNTER — Ambulatory Visit: Payer: Self-pay

## 2024-05-02 NOTE — Telephone Encounter (Signed)
 FYI Only or Action Required?: FYI only for provider: ED advised. Pt declined 911  Patient was last seen in primary care on 04/10/2024 by Georgina Speaks, [name]  Called Nurse Triage reporting Headache and Extremity Weakness.  Symptoms began unknown.  Interventions attempted: Nothing.  Symptoms are: unknown.  Triage Disposition: Call EMS 911 Now  Patient/caregiver understands and will follow disposition?: Yes

## 2024-05-02 NOTE — Telephone Encounter (Signed)
" ° °  Reason for Disposition  [1] Loss of speech or garbled speech AND [2] sudden onset AND [3] present now  Answer Assessment - Initial Assessment Questions Pt called for her daughter and then said she'd like to make an appt too. She states she has had surgery on her left ankle and that left leg is kind of numb sometimes. She states she is also having a headache, pressure behind her eyes, and right shoulder weakness. She is also having cough, chills, sneezing. When asking higher acuity questions, pt states her speech is a litter slurred. Pt has a history of strokes. RN recommended ER and offered 911. Pt declined stating she has someone to take her.  Due to ER dispo, not all questions answered.     1. SYMPTOM: What is the main symptom you are concerned about? (e.g., weakness, numbness)     Headache, weakness and slurred speech 2. ONSET: When did this start? (e.g., minutes, hours, days; while sleeping)      3. LAST NORMAL: When was the last time you (the patient) were normal (no symptoms)?      4. PATTERN Does this come and go, or has it been constant since it started?  Is it present now?      5. CARDIAC SYMPTOMS: Have you had any of the following symptoms: chest pain, difficulty breathing, palpitations?      6. NEUROLOGIC SYMPTOMS: Have you had any of the following symptoms: headache, dizziness, vision loss, double vision, changes in speech, unsteady on your feet?     Headache, changes in speech.  7. OTHER SYMPTOMS: Do you have any other symptoms?      8. PREGNANCY: Is there any chance you are pregnant? When was your last menstrual period?  Protocols used: Neurologic Deficit-A-AH  "

## 2024-05-03 ENCOUNTER — Encounter (HOSPITAL_BASED_OUTPATIENT_CLINIC_OR_DEPARTMENT_OTHER): Payer: Self-pay

## 2024-05-04 NOTE — Progress Notes (Signed)
 " OFFICE NOTE:    Date:  05/05/2024  ID:  Margaret [name], DOB [birth year], MRN [id number] PCP: Georgina Speaks, FNP  [facility] Providers Cardiologist:  Oneil Parchment, MD        Coronary artery disease CCTA 07/24/22: CAC score 0, TPV 22 mm3 (63rd %-ile); min nonobs CAD [mLAD 0-24, RI 0-24] Paroxysmal atrial fibrillation  TTE 09/07/16: EF 60-65, no RWMA, Gr 1 DD TEE 09/08/16: NL EF, trivial AI, mild MR, bubble study with very small R-L shunt  Patent Foramen Ovale Hx of CVA 09/2016 Carotid US  09/2016: < 50% bilat ICA  Hypertension  Hyperlipidemia  Aortic atherosclerosis  Hypothyroidism  Depression  Iron def anemia  Tobacco use        Discussed the use of AI scribe software for clinical note transcription with the patient, who gave verbal consent to proceed. History of Present Illness Margaret [name] is a 54 y.o. female for follow up of AFib. She is a prior pt of Dr. Mady. Notes indicate she is to est with Dr. Parchment. Last seen by Reche Finder, NP in 12/2022. She has recently been managed by primary care for iron deficiency anemia.    She has been off of her Eliquis , Metoprolol  and Lipitor recently. She is in the middle of moving. She has residual right-sided weakness from her stroke. She notes occasional sharp pains in her R arm and fluctuating strength. 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 No new or worsening symptoms have been reported recently. She has been experiencing headaches for about one to two weeks, located centrally and on the right side. These headaches are intermittent, and she reports blurry vision but no loss of vision. She experiences chronic shortness of breath with exertion. No recent changes in her shortness of breath. No chest pain, pressure, or tightness. She has seen primary care recently for iron deficiency anemia and hypothyroidism.     Review of Systems  Gastrointestinal:  Negative for hematochezia and melena.   Genitourinary:  Negative for hematuria.  -See HPI    Studies Reviewed:  EKG Interpretation Date/Time:  Friday May 05 2024 11:40:22 EST Ventricular Rate:  84 PR Interval:  162 QRS Duration:  82 QT Interval:  356 QTC Calculation: 420 R Axis:   41  Text Interpretation: Normal sinus rhythm Nonspecific T wave abnormality When compared with ECG of 13-Jul-2023 11:57, No significant change was found Confirmed by Lelon Hamilton [id number]) on 05/05/2024 11:42:35 AM    LABS 08/05/23: TC 219, Trig 156, HDL 42, LDL 149 01/24/24: SCr 0.82, K 4, ALT 10, Hgb 8.5 04/20/24: TSH 6.820  Risk Assessment/Calculations:  CHA2DS2-VASc Score = 5   This indicates a 7.2% annual risk of stroke. The patient's score is based upon: CHF History: 0 HTN History: 1 Diabetes History: 0 Stroke History: 2 Vascular Disease History: 1 Age Score: 0 Gender Score: 1     HYPERTENSION CONTROL Vitals:   05/05/24 1142 05/05/24 1235  BP: (!) 146/90 (!) 126/90    The patient's blood pressure is elevated above target today.  In order to address the patient's elevated BP: Blood pressure will be monitored at home to determine if medication changes need to be made. (Restart Metoprolol )         Physical Exam:  VS:  BP (!) 126/90   Pulse 86   Ht 5' 6 (1.676 m)   Wt 217 lb 6.4 oz (98.6 kg)   LMP 05/07/2016  SpO2 99%   BMI 35.09 kg/m        Wt Readings from Last 3 Encounters:  05/05/24 217 lb 6.4 oz (98.6 kg)  01/24/24 225 lb 3.2 oz (102.2 kg)  08/05/23 228 lb 6.4 oz (103.6 kg)    Constitutional:      Appearance: Healthy appearance. Not in distress.  Neck:     Vascular: JVD normal.  Pulmonary:     Breath sounds: Normal breath sounds. No wheezing. No rales.  Cardiovascular:     Normal rate. Regular rhythm.     Murmurs: There is no murmur.  Edema:    Peripheral edema absent.  Abdominal:     Palpations: Abdomen is soft.       Assessment and Plan:    Assessment & Plan Paroxysmal atrial fibrillation  [facility]) She has a hx of stroke in 2018. Monitoring showed parox atrial fibrillation. She has been on Eliquis  since. She has been out of Eliquis  for a little while. She does not chronic R arm weakness and pain. She sometimes notices this is worse. Overall, she does not seem to be describing new weakness. She also notes some difficulty with speech but is not describing aphasia. Overall, I do not think she is describing TIA symptoms. However, she needs to resume Eliquis  to reduce risk of stroke. We discussed the importance of this. She has been in the middle of a move and has missed it for about a week or so. I will refill this for her.  - Restart/Continue Eliquis  5 mg twice daily (refill sent) - Restart/Continue Metoprolol  tartrate 25 mg twice daily. - Arrange CMET, CBC in 3 mos. - Arrange follow up echocardiogram   - Follow up with Dr. Jeffrie in 6 mos.  Coronary artery disease involving native coronary artery of native heart without angina pectoris Minimal nonobstructive CAD on CCTA in 2024. She is not having chest pain to suggest angina. She is not on ASA as she is on Eliquis . - Restart Atorvastatin  40 mg once daily  - CMET, Lipids in 3 mos - Follow up Dr. Jeffrie in 6 mos.  Essential hypertension BP somewhat elevated today. She has not taken Metoprolol  in a while. - Restart Metoprolol  tartrate 25 mg twice daily  - If BP continues to run high, consider ACE/ARB Hyperlipidemia LDL goal <70 Recent LDL above goal - Restart Atorvastatin  40 mg once daily.  - Order CMET, Lipids in 3 mos History of stroke As noted, she has residual R sided weakness and pain. She does not seem to be describing any acute changes. I have asked her to follow up with her PCP to determine if PT would be beneficial. As noted above, she had some recent speech issues that do not sound like aphasia or a TIA. I have asked her to resume her Eliquis . I will also get Carotid US  and follow up echocardiogram.  - Carotid US  -  Echocardiogram  Iron deficiency anemia secondary to inadequate dietary iron intake Follow up with primary care for management.  Bilateral carotid artery stenosis < 50% bilat ICA stenosis in 2018. As noted, I do not think her recent symptoms are c/w TIA symptoms. However, will get Carotid US  and Echocardiogram to be complete.  Tobacco use I have recommended cessation. Shortness of breath She notes dyspnea on exertion which is chronic. This is likely related to smoking. Will get an echocardiogram to rule out structural heart disease. - Echocardiogram  Acquired hypothyroidism Recent TSH high. I have asked her to follow  up with primary care for management.         Dispo:  Return in about 6 months (around 11/02/2024) for Routine Follow Up, w/ Dr. Jeffrie.  Signed, Glendia Ferrier, PA-C   "

## 2024-05-05 ENCOUNTER — Ambulatory Visit: Attending: Physician Assistant | Admitting: Physician Assistant

## 2024-05-05 ENCOUNTER — Telehealth: Payer: Self-pay

## 2024-05-05 ENCOUNTER — Encounter: Payer: Self-pay | Admitting: Physician Assistant

## 2024-05-05 VITALS — BP 126/90 | HR 86 | Ht 66.0 in | Wt 217.4 lb

## 2024-05-05 DIAGNOSIS — I10 Essential (primary) hypertension: Secondary | ICD-10-CM

## 2024-05-05 DIAGNOSIS — I6523 Occlusion and stenosis of bilateral carotid arteries: Secondary | ICD-10-CM | POA: Diagnosis not present

## 2024-05-05 DIAGNOSIS — D508 Other iron deficiency anemias: Secondary | ICD-10-CM

## 2024-05-05 DIAGNOSIS — Z8673 Personal history of transient ischemic attack (TIA), and cerebral infarction without residual deficits: Secondary | ICD-10-CM | POA: Diagnosis not present

## 2024-05-05 DIAGNOSIS — E785 Hyperlipidemia, unspecified: Secondary | ICD-10-CM | POA: Diagnosis not present

## 2024-05-05 DIAGNOSIS — E039 Hypothyroidism, unspecified: Secondary | ICD-10-CM

## 2024-05-05 DIAGNOSIS — I480 Paroxysmal atrial fibrillation: Secondary | ICD-10-CM

## 2024-05-05 DIAGNOSIS — I2510 Atherosclerotic heart disease of native coronary artery without angina pectoris: Secondary | ICD-10-CM

## 2024-05-05 DIAGNOSIS — Z720 Tobacco use: Secondary | ICD-10-CM

## 2024-05-05 DIAGNOSIS — R0602 Shortness of breath: Secondary | ICD-10-CM

## 2024-05-05 MED ORDER — ATORVASTATIN CALCIUM 40 MG PO TABS: 40.0000 mg | ORAL_TABLET | Freq: Every day | ORAL | 3 refills | Status: AC

## 2024-05-05 MED ORDER — METOPROLOL TARTRATE 25 MG PO TABS: 25.0000 mg | ORAL_TABLET | Freq: Two times a day (BID) | ORAL | 3 refills | Status: AC

## 2024-05-05 MED ORDER — APIXABAN 5 MG PO TABS: 5.0000 mg | ORAL_TABLET | Freq: Two times a day (BID) | ORAL | 1 refills | Status: AC

## 2024-05-05 NOTE — Assessment & Plan Note (Signed)
 Recent LDL above goal - Restart Atorvastatin  40 mg once daily.  - Order CMET, Lipids in 3 mos

## 2024-05-05 NOTE — Assessment & Plan Note (Signed)
 She has a hx of stroke in 2018. Monitoring showed parox atrial fibrillation. She has been on Eliquis  since. She has been out of Eliquis  for a little while. She does not chronic R arm weakness and pain. She sometimes notices this is worse. Overall, she does not seem to be describing new weakness. She also notes some difficulty with speech but is not describing aphasia. Overall, I do not think she is describing TIA symptoms. However, she needs to resume Eliquis  to reduce risk of stroke. We discussed the importance of this. She has been in the middle of a move and has missed it for about a week or so. I will refill this for her.  - Restart/Continue Eliquis  5 mg twice daily (refill sent) - Restart/Continue Metoprolol  tartrate 25 mg twice daily. - Arrange CMET, CBC in 3 mos. - Arrange follow up echocardiogram   - Follow up with Dr. Jeffrie in 6 mos.

## 2024-05-05 NOTE — Assessment & Plan Note (Signed)
 Minimal nonobstructive CAD on CCTA in 2024. She is not having chest pain to suggest angina. She is not on ASA as she is on Eliquis . - Restart Atorvastatin  40 mg once daily  - CMET, Lipids in 3 mos - Follow up Dr. Jeffrie in 6 mos.

## 2024-05-05 NOTE — Patient Instructions (Signed)
 Medication Instructions:  Your medication has been sent to your pharmacy.  Please follow up with your primary care provider regarding your arm pain, anemia and thyroid  *If you need a refill on your cardiac medications before your next appointment, please call your pharmacy*  Lab Work: Return in 3 months fasting labs...................SABRA LIPID, CBC, CMET If you have labs (blood work) drawn today and your tests are completely normal, you will receive your results only by: MyChart Message (if you have MyChart) OR A paper copy in the mail If you have any lab test that is abnormal or we need to change your treatment, we will call you to review the results.  Testing/Procedures: No procedures were ordered during today's visit.   Follow-Up: At [facility], you and your health needs are our priority.  As part of our continuing mission to provide you with exceptional heart care, our providers are all part of one team.  This team includes your primary Cardiologist (physician) and Advanced Practice Providers or APPs (Physician Assistants and Nurse Practitioners) who all work together to provide you with the care you need, when you need it.  Your next appointment:   6 month(s)  Provider:   DR. JEFFRIE   We recommend signing up for the patient portal called MyChart.  Sign up information is provided on this After Visit Summary.  MyChart is used to connect with patients for Virtual Visits (Telemedicine).  Patients are able to view lab/test results, encounter notes, upcoming appointments, etc.  Non-urgent messages can be sent to your provider as well.   To learn more about what you can do with MyChart, go to [email or web address].   Other Instructions

## 2024-05-05 NOTE — Assessment & Plan Note (Signed)
 Recent TSH high. I have asked her to follow up with primary care for management.

## 2024-05-05 NOTE — Assessment & Plan Note (Signed)
 As noted, she has residual R sided weakness and pain. She does not seem to be describing any acute changes. I have asked her to follow up with her PCP to determine if PT would be beneficial. As noted above, she had some recent speech issues that do not sound like aphasia or a TIA. I have asked her to resume her Eliquis . I will also get Carotid US  and follow up echocardiogram.  - Carotid US  - Echocardiogram

## 2024-05-05 NOTE — Assessment & Plan Note (Signed)
 BP somewhat elevated today. She has not taken Metoprolol  in a while. - Restart Metoprolol  tartrate 25 mg twice daily  - If BP continues to run high, consider ACE/ARB

## 2024-05-05 NOTE — Assessment & Plan Note (Signed)
 Follow up with primary care for management.

## 2024-05-08 ENCOUNTER — Ambulatory Visit: Payer: Self-pay | Admitting: Nurse Practitioner

## 2024-05-10 NOTE — Telephone Encounter (Signed)
 Please call pt to schedule echo and carotid doppler. Thank you

## 2024-06-08 ENCOUNTER — Ambulatory Visit (HOSPITAL_COMMUNITY)
